# Patient Record
Sex: Male | Born: 1945
Health system: Southern US, Community
[De-identification: ages and names within clinical notes are randomized; demographics above are authoritative.]

## PROBLEM LIST (undated history)

## (undated) DIAGNOSIS — I25119 Atherosclerotic heart disease of native coronary artery with unspecified angina pectoris: Secondary | ICD-10-CM

## (undated) DIAGNOSIS — T7840XA Allergy, unspecified, initial encounter: Secondary | ICD-10-CM

## (undated) DIAGNOSIS — M109 Gout, unspecified: Secondary | ICD-10-CM

## (undated) DIAGNOSIS — I2510 Atherosclerotic heart disease of native coronary artery without angina pectoris: Secondary | ICD-10-CM

## (undated) DIAGNOSIS — R0602 Shortness of breath: Secondary | ICD-10-CM

## (undated) DIAGNOSIS — C439 Malignant melanoma of skin, unspecified: Secondary | ICD-10-CM

## (undated) DIAGNOSIS — I10 Essential (primary) hypertension: Secondary | ICD-10-CM

## (undated) DIAGNOSIS — E785 Hyperlipidemia, unspecified: Secondary | ICD-10-CM

## (undated) DIAGNOSIS — R7302 Impaired glucose tolerance (oral): Secondary | ICD-10-CM

## (undated) DIAGNOSIS — N400 Enlarged prostate without lower urinary tract symptoms: Secondary | ICD-10-CM

## (undated) DIAGNOSIS — Z86010 Personal history of colonic polyps: Secondary | ICD-10-CM

## (undated) DIAGNOSIS — R079 Chest pain, unspecified: Secondary | ICD-10-CM

## (undated) HISTORY — DX: Malignant melanoma of skin, unspecified: C43.9

## (undated) HISTORY — DX: Hyperlipidemia, unspecified: E78.5

## (undated) HISTORY — DX: Chest pain, unspecified: R07.9

## (undated) HISTORY — DX: Atherosclerotic heart disease of native coronary artery with unspecified angina pectoris: I25.119

## (undated) HISTORY — DX: Benign prostatic hyperplasia without lower urinary tract symptoms: N40.0

## (undated) HISTORY — DX: Personal history of colonic polyps: Z86.010

## (undated) HISTORY — DX: Shortness of breath: R06.02

## (undated) HISTORY — PX: OTHER SURGICAL HISTORY: SHX169

## (undated) HISTORY — DX: Gout, unspecified: M10.9

## (undated) HISTORY — DX: Allergy, unspecified, initial encounter: T78.40XA

## (undated) HISTORY — DX: Essential (primary) hypertension: I10

## (undated) HISTORY — DX: Impaired glucose tolerance (oral): R73.02

## (undated) HISTORY — PX: HERNIA REPAIR: SHX51

---

## 2000-01-04 ENCOUNTER — Encounter: Admission: RE | Admit: 2000-01-04 | Discharge: 2000-01-04 | Payer: Self-pay | Admitting: *Deleted

## 2000-01-04 ENCOUNTER — Encounter: Payer: Self-pay | Admitting: *Deleted

## 2000-11-18 ENCOUNTER — Inpatient Hospital Stay (HOSPITAL_COMMUNITY): Admission: EM | Admit: 2000-11-18 | Discharge: 2000-11-19 | Payer: Self-pay | Admitting: Emergency Medicine

## 2000-11-18 ENCOUNTER — Encounter: Payer: Self-pay | Admitting: Orthopedic Surgery

## 2002-04-10 ENCOUNTER — Ambulatory Visit (HOSPITAL_COMMUNITY): Admission: RE | Admit: 2002-04-10 | Discharge: 2002-04-10 | Payer: Self-pay | Admitting: Gastroenterology

## 2002-04-10 ENCOUNTER — Encounter (INDEPENDENT_AMBULATORY_CARE_PROVIDER_SITE_OTHER): Payer: Self-pay | Admitting: Specialist

## 2004-09-03 ENCOUNTER — Encounter: Admission: RE | Admit: 2004-09-03 | Discharge: 2004-09-03 | Payer: Self-pay | Admitting: Family Medicine

## 2006-01-11 ENCOUNTER — Ambulatory Visit: Payer: Self-pay | Admitting: Internal Medicine

## 2006-01-18 ENCOUNTER — Ambulatory Visit: Payer: Self-pay | Admitting: Internal Medicine

## 2007-01-12 ENCOUNTER — Ambulatory Visit: Payer: Self-pay | Admitting: Internal Medicine

## 2007-01-12 LAB — CONVERTED CEMR LAB
ALT: 23 units/L (ref 0–40)
AST: 27 units/L (ref 0–37)
Albumin: 4.3 g/dL (ref 3.5–5.2)
Alkaline Phosphatase: 48 units/L (ref 39–117)
BUN: 10 mg/dL (ref 6–23)
Basophils Absolute: 0 10*3/uL (ref 0.0–0.1)
Basophils Relative: 0.8 % (ref 0.0–1.0)
Bilirubin, Direct: 0.1 mg/dL (ref 0.0–0.3)
CO2: 33 meq/L — ABNORMAL HIGH (ref 19–32)
Calcium: 9.7 mg/dL (ref 8.4–10.5)
Chloride: 104 meq/L (ref 96–112)
Cholesterol: 151 mg/dL (ref 0–200)
Creatinine, Ser: 1 mg/dL (ref 0.4–1.5)
Eosinophils Absolute: 0.2 10*3/uL (ref 0.0–0.6)
Eosinophils Relative: 4.2 % (ref 0.0–5.0)
GFR calc Af Amer: 98 mL/min
GFR calc non Af Amer: 81 mL/min
Glucose, Bld: 103 mg/dL — ABNORMAL HIGH (ref 70–99)
HCT: 52.2 % — ABNORMAL HIGH (ref 39.0–52.0)
HDL: 46.2 mg/dL (ref >39.0)
Hemoglobin: 17.5 g/dL — ABNORMAL HIGH (ref 13.0–17.0)
LDL Cholesterol: 83 mg/dL (ref 0–99)
Lymphocytes Relative: 39.1 % (ref 12.0–46.0)
MCHC: 33.6 g/dL (ref 30.0–36.0)
MCV: 87.7 fL (ref 78.0–100.0)
Monocytes Absolute: 0.4 10*3/uL (ref 0.2–0.7)
Monocytes Relative: 7.9 % (ref 3.0–11.0)
Neutro Abs: 2.4 10*3/uL (ref 1.4–7.7)
Neutrophils Relative %: 48 % (ref 43.0–77.0)
PSA: 0.48 ng/mL (ref 0.10–4.00)
Platelets: 240 10*3/uL (ref 150–400)
Potassium: 5.4 meq/L — ABNORMAL HIGH (ref 3.5–5.1)
RBC: 5.95 M/uL — ABNORMAL HIGH (ref 4.22–5.81)
RDW: 13.4 % (ref 11.5–14.6)
Sodium: 143 meq/L (ref 135–145)
TSH: 2.85 microintl units/mL (ref 0.35–5.50)
Total Bilirubin: 0.8 mg/dL (ref 0.3–1.2)
Total CHOL/HDL Ratio: 3.3
Total Protein: 6.8 g/dL (ref 6.0–8.3)
Triglycerides: 111 mg/dL (ref 0–149)
VLDL: 22 mg/dL (ref 0–40)
WBC: 5 10*3/uL (ref 4.5–10.5)

## 2007-01-19 ENCOUNTER — Ambulatory Visit: Payer: Self-pay | Admitting: Internal Medicine

## 2007-02-09 ENCOUNTER — Encounter: Payer: Self-pay | Admitting: Internal Medicine

## 2007-02-09 LAB — HM COLONOSCOPY

## 2007-05-16 ENCOUNTER — Ambulatory Visit: Payer: Self-pay | Admitting: Internal Medicine

## 2008-09-02 ENCOUNTER — Encounter: Payer: Self-pay | Admitting: Internal Medicine

## 2008-09-20 ENCOUNTER — Ambulatory Visit: Payer: Self-pay | Admitting: Internal Medicine

## 2008-09-20 LAB — CONVERTED CEMR LAB
ALT: 31 units/L (ref 0–53)
AST: 31 units/L (ref 0–37)
Albumin: 4.3 g/dL (ref 3.5–5.2)
Alkaline Phosphatase: 48 units/L (ref 39–117)
BUN: 11 mg/dL (ref 6–23)
Basophils Absolute: 0 10*3/uL (ref 0.0–0.1)
Basophils Relative: 0 % (ref 0.0–3.0)
Bilirubin Urine: NEGATIVE
Bilirubin, Direct: 0.1 mg/dL (ref 0.0–0.3)
Blood in Urine, dipstick: NEGATIVE
CO2: 31 meq/L (ref 19–32)
Calcium: 9.2 mg/dL (ref 8.4–10.5)
Chloride: 107 meq/L (ref 96–112)
Cholesterol: 145 mg/dL (ref 0–200)
Creatinine, Ser: 1 mg/dL (ref 0.4–1.5)
Eosinophils Absolute: 0.2 10*3/uL (ref 0.0–0.7)
Eosinophils Relative: 2.6 % (ref 0.0–5.0)
GFR calc Af Amer: 97 mL/min
GFR calc non Af Amer: 80 mL/min
Glucose, Bld: 103 mg/dL — ABNORMAL HIGH (ref 70–99)
Glucose, Urine, Semiquant: NEGATIVE
HCT: 49.9 % (ref 39.0–52.0)
HDL: 43.3 mg/dL (ref >39.0)
Hemoglobin: 17 g/dL (ref 13.0–17.0)
Ketones, urine, test strip: NEGATIVE
LDL Cholesterol: 86 mg/dL (ref 0–99)
Lymphocytes Relative: 33.3 % (ref 12.0–46.0)
MCHC: 34.1 g/dL (ref 30.0–36.0)
MCV: 87.8 fL (ref 78.0–100.0)
Monocytes Absolute: 0.5 10*3/uL (ref 0.1–1.0)
Monocytes Relative: 7.7 % (ref 3.0–12.0)
Neutro Abs: 3.2 10*3/uL (ref 1.4–7.7)
Neutrophils Relative %: 56.4 % (ref 43.0–77.0)
Nitrite: NEGATIVE
PSA: 0.33 ng/mL (ref 0.10–4.00)
Platelets: 191 10*3/uL (ref 150–400)
Potassium: 4.4 meq/L (ref 3.5–5.1)
Protein, U semiquant: NEGATIVE
RBC: 5.68 M/uL (ref 4.22–5.81)
RDW: 13 % (ref 11.5–14.6)
Sodium: 145 meq/L (ref 135–145)
Specific Gravity, Urine: 1.02
TSH: 1.61 microintl units/mL (ref 0.35–5.50)
Total Bilirubin: 1.1 mg/dL (ref 0.3–1.2)
Total CHOL/HDL Ratio: 3.3
Total Protein: 7.1 g/dL (ref 6.0–8.3)
Triglycerides: 81 mg/dL (ref 0–149)
Urobilinogen, UA: 0.2
VLDL: 16 mg/dL (ref 0–40)
WBC Urine, dipstick: NEGATIVE
WBC: 5.9 10*3/uL (ref 4.5–10.5)
pH: 6.5

## 2008-09-27 ENCOUNTER — Ambulatory Visit: Payer: Self-pay | Admitting: Internal Medicine

## 2008-09-27 DIAGNOSIS — Z860100 Personal history of colon polyps, unspecified: Secondary | ICD-10-CM

## 2008-09-27 HISTORY — DX: Personal history of colon polyps, unspecified: Z86.0100

## 2008-09-27 HISTORY — DX: Personal history of colonic polyps: Z86.010

## 2009-01-17 ENCOUNTER — Ambulatory Visit: Payer: Self-pay | Admitting: Internal Medicine

## 2009-01-17 DIAGNOSIS — I10 Essential (primary) hypertension: Secondary | ICD-10-CM

## 2009-01-17 DIAGNOSIS — I152 Hypertension secondary to endocrine disorders: Secondary | ICD-10-CM | POA: Insufficient documentation

## 2009-01-17 HISTORY — DX: Essential (primary) hypertension: I10

## 2009-02-21 ENCOUNTER — Ambulatory Visit: Payer: Self-pay | Admitting: Internal Medicine

## 2009-08-14 ENCOUNTER — Encounter: Payer: Self-pay | Admitting: Internal Medicine

## 2009-09-19 ENCOUNTER — Ambulatory Visit: Payer: Self-pay | Admitting: Internal Medicine

## 2009-09-19 DIAGNOSIS — N400 Enlarged prostate without lower urinary tract symptoms: Secondary | ICD-10-CM

## 2009-09-19 HISTORY — DX: Benign prostatic hyperplasia without lower urinary tract symptoms: N40.0

## 2009-09-19 LAB — CONVERTED CEMR LAB
Bilirubin Urine: NEGATIVE
Blood in Urine, dipstick: NEGATIVE
Glucose, Urine, Semiquant: NEGATIVE
Ketones, urine, test strip: NEGATIVE
Nitrite: NEGATIVE
PSA: 0.46 ng/mL (ref 0.10–4.00)
Specific Gravity, Urine: 1.02
Urobilinogen, UA: 0.2
WBC Urine, dipstick: NEGATIVE
pH: 5

## 2009-10-17 ENCOUNTER — Ambulatory Visit: Payer: Self-pay | Admitting: Internal Medicine

## 2009-11-07 ENCOUNTER — Ambulatory Visit: Payer: Self-pay | Admitting: Internal Medicine

## 2009-11-07 DIAGNOSIS — M109 Gout, unspecified: Secondary | ICD-10-CM

## 2009-11-07 DIAGNOSIS — I2510 Atherosclerotic heart disease of native coronary artery without angina pectoris: Secondary | ICD-10-CM | POA: Insufficient documentation

## 2009-11-07 HISTORY — DX: Gout, unspecified: M10.9

## 2010-02-06 ENCOUNTER — Ambulatory Visit: Payer: Self-pay | Admitting: Internal Medicine

## 2010-02-06 LAB — CONVERTED CEMR LAB
ALT: 24 units/L (ref 0–53)
AST: 28 units/L (ref 0–37)
Albumin: 4.6 g/dL (ref 3.5–5.2)
Basophils Relative: 0.5 % (ref 0.0–3.0)
Bilirubin Urine: NEGATIVE
Blood in Urine, dipstick: NEGATIVE
Chloride: 109 meq/L (ref 96–112)
Eosinophils Relative: 2.4 % (ref 0.0–5.0)
GFR calc non Af Amer: 71.4 mL/min (ref >60)
Glucose, Urine, Semiquant: NEGATIVE
HCT: 48.5 % (ref 39.0–52.0)
Hemoglobin: 15.9 g/dL (ref 13.0–17.0)
Ketones, urine, test strip: NEGATIVE
LDL Cholesterol: 64 mg/dL (ref 0–99)
Lymphs Abs: 1.6 10*3/uL (ref 0.7–4.0)
Monocytes Relative: 8.9 % (ref 3.0–12.0)
Neutro Abs: 3 10*3/uL (ref 1.4–7.7)
Nitrite: NEGATIVE
Potassium: 4.4 meq/L (ref 3.5–5.1)
Protein, U semiquant: NEGATIVE
RBC: 5.4 M/uL (ref 4.22–5.81)
Sodium: 142 meq/L (ref 135–145)
Specific Gravity, Urine: 1.02
TSH: 2.09 microintl units/mL (ref 0.35–5.50)
Total Bilirubin: 0.8 mg/dL (ref 0.3–1.2)
Total Protein: 7.5 g/dL (ref 6.0–8.3)
Urobilinogen, UA: 0.2
VLDL: 20.8 mg/dL (ref 0.0–40.0)
WBC Urine, dipstick: NEGATIVE
WBC: 5.2 10*3/uL (ref 4.5–10.5)
pH: 5

## 2010-02-13 ENCOUNTER — Ambulatory Visit: Payer: Self-pay | Admitting: Internal Medicine

## 2010-08-14 ENCOUNTER — Ambulatory Visit: Payer: Self-pay | Admitting: Internal Medicine

## 2010-08-14 DIAGNOSIS — E785 Hyperlipidemia, unspecified: Secondary | ICD-10-CM | POA: Insufficient documentation

## 2010-08-14 DIAGNOSIS — E1169 Type 2 diabetes mellitus with other specified complication: Secondary | ICD-10-CM

## 2010-08-14 HISTORY — DX: Type 2 diabetes mellitus with other specified complication: E11.69

## 2010-08-14 HISTORY — DX: Hyperlipidemia, unspecified: E78.5

## 2010-12-31 NOTE — Assessment & Plan Note (Signed)
Summary: 6 month rov/njr   Vital Signs:  Patient profile:   65 year old male Weight:      220 pounds Temp:     98.1 degrees F oral BP sitting:   110 / 78  (right arm) Cuff size:   regular  Vitals Entered By: Duard Brady LPN (August 14, 2010 10:04 AM) CC: 6 mos rov - doing well   CC:  6 mos rov - doing well.  History of Present Illness: 65 year old patient who is seen today for follow-up.  He has a history of hypertension and dyslipidemia.  Medical regimen includes 80 mg of simvastatin, as well as amlodipine.  Is now well.  No concerns or complaints.  His weight is down modestly  Allergies: 1)  Sulfamethoxazole (Sulfamethoxazole)  Past History:  Past Medical History: High Cholesterol Allergies Colonic polyps, hx of Hypertension Benign prostatic hypertrophy suspected gout impaired glucose tolerance Hyperlipidemia  Review of Systems  The patient denies anorexia, fever, weight loss, weight gain, vision loss, decreased hearing, hoarseness, chest pain, syncope, dyspnea on exertion, peripheral edema, prolonged cough, headaches, hemoptysis, abdominal pain, melena, hematochezia, severe indigestion/heartburn, hematuria, incontinence, genital sores, muscle weakness, suspicious skin lesions, transient blindness, difficulty walking, depression, unusual weight change, abnormal bleeding, enlarged lymph nodes, angioedema, breast masses, and testicular masses.    Physical Exam  General:  overweight-appearing.  120/84overweight-appearing.   Head:  Normocephalic and atraumatic without obvious abnormalities. No apparent alopecia or balding. Eyes:  No corneal or conjunctival inflammation noted. EOMI. Perrla. Funduscopic exam benign, without hemorrhages, exudates or papilledema. Vision grossly normal. Mouth:  Oral mucosa and oropharynx without lesions or exudates.  Teeth in good repair. Neck:  No deformities, masses, or tenderness noted. Lungs:  Normal respiratory effort, chest  expands symmetrically. Lungs are clear to auscultation, no crackles or wheezes. Heart:  Normal rate and regular rhythm. S1 and S2 normal without gallop, murmur, click, rub or other extra sounds. Abdomen:  Bowel sounds positive,abdomen soft and non-tender without masses, organomegaly or hernias noted. Msk:  No deformity or scoliosis noted of thoracic or lumbar spine.   Pulses:  R and L carotid,radial,femoral,dorsalis pedis and posterior tibial pulses are full and equal bilaterally Extremities:  No clubbing, cyanosis, edema, or deformity noted with normal full range of motion of all joints.     Impression & Recommendations:  Problem # 1:  HYPERLIPIDEMIA (ICD-272.4)  The following medications were removed from the medication list:    Simvastatin 80 Mg Tabs (Simvastatin) ..... One daily His updated medication list for this problem includes:    Simvastatin 20 Mg Tabs (Simvastatin) ..... One daily  The following medications were removed from the medication list:    Simvastatin 80 Mg Tabs (Simvastatin) ..... One daily His updated medication list for this problem includes:    Simvastatin 20 Mg Tabs (Simvastatin) ..... One daily  Problem # 2:  HYPERTENSION (ICD-401.9)  His updated medication list for this problem includes:    Amlodipine Besylate 5 Mg Tabs (Amlodipine besylate) ..... One daily  His updated medication list for this problem includes:    Amlodipine Besylate 5 Mg Tabs (Amlodipine besylate) ..... One daily  Complete Medication List: 1)  Bayer Aspirin 325 Mg Tabs (Aspirin) .Marland Kitchen.. 1 once daily 2)  Levitra 10 Mg Tabs (Vardenafil hcl) .... Use daily as directed 3)  Tamsulosin Hcl 0.4 Mg Caps (Tamsulosin hcl) .... One daily 4)  Amlodipine Besylate 5 Mg Tabs (Amlodipine besylate) .... One daily 5)  Simvastatin 20 Mg Tabs (Simvastatin) .... One daily  Patient Instructions: 1)  Please schedule a follow-up appointment in 6 months. 2)  Advised not to eat any food or drink any liquids  after 10 PM the night before your procedure. 3)  Limit your Sodium (Salt) to less than 2 grams a day(slightly less than 1/2 a teaspoon) to prevent fluid retention, swelling, or worsening of symptoms. 4)  It is important that you exercise regularly at least 20 minutes 5 times a week. If you develop chest pain, have severe difficulty breathing, or feel very tired , stop exercising immediately and seek medical attention. 5)  You need to lose weight. Consider a lower calorie diet and regular exercise.  6)  Check your Blood Pressure regularly. If it is above: 150/90  you should make an appointment. Prescriptions: SIMVASTATIN 20 MG TABS (SIMVASTATIN) one daily  #90 x 6   Entered and Authorized by:   Gordy Savers  MD   Signed by:   Gordy Savers  MD on 08/14/2010   Method used:   Print then Give to Patient   RxID:   [id number] AMLODIPINE BESYLATE 5 MG TABS (AMLODIPINE BESYLATE) one daily  #90 x 6   Entered and Authorized by:   Gordy Savers  MD   Signed by:   Gordy Savers  MD on 08/14/2010   Method used:   Print then Give to Patient   RxID:   [id number] TAMSULOSIN HCL 0.4 MG CAPS (TAMSULOSIN HCL) one daily  #90 x 6   Entered and Authorized by:   Gordy Savers  MD   Signed by:   Gordy Savers  MD on 08/14/2010   Method used:   Print then Give to Patient   RxID:   [id number] LEVITRA 10 MG TABS (VARDENAFIL HCL) use daily as directed  #6 x 6   Entered and Authorized by:   Gordy Savers  MD   Signed by:   Gordy Savers  MD on 08/14/2010   Method used:   Print then Give to Patient   RxID:   [id number]

## 2010-12-31 NOTE — Assessment & Plan Note (Signed)
Summary: CPX//CCM   Vital Signs:  Patient profile:   65 year old male Height:      70 inches Weight:      223 pounds BMI:     32.11 Temp:     97.8 degrees F oral BP sitting:   138 / 76  (right arm) Cuff size:   regular  Vitals Entered By: Duard Brady LPN (February 13, 2010 9:33 AM) CC: cpx - doing well Is Patient Diabetic? No   CC:  cpx - doing well.  History of Present Illness: a 65 year old patient who is seen today for annual examination.  He enjoys excellent health with a history of hypertension, BPH, and history of colonic polyps.  He has treated dyslipidemia.  He has a family history of coronary artery disease.  He denies any exertional chest pain.  Preventive Screening-Counseling & Management  Alcohol-Tobacco     Smoking Status: never  Allergies: 1)  Sulfamethoxazole (Sulfamethoxazole)  Past History:  Past Medical History: High Cholesterol Allergies Colonic polyps, hx of Hypertension Benign prostatic hypertrophy suspected gout impaired glucose tolerance  Past Surgical History: Reviewed history from 09/27/2008 and no changes required. right inguinal  hernia repair in 1990   colonoscopy March 2008  Family History: Reviewed history from 09/27/2008 and no changes required. Family History Diabetes 1st degree relative Family History Hypertension Family History of Cardiovascular disorder  father died age 41 of MI Mother died age 17 complications of senile dementia  One brother, status post CABG one sister, status post  CABG one sister deceased from melanoma  Social History: Reviewed history from 09/27/2008 and no changes required. Occupation: Airline pilot Rep Married discontinued tobacco, approximately 30 years ago Retired Smoking Status:  never  Review of Systems  The patient denies anorexia, fever, weight loss, weight gain, vision loss, decreased hearing, hoarseness, chest pain, syncope, dyspnea on exertion, peripheral edema, prolonged cough,  headaches, hemoptysis, abdominal pain, melena, hematochezia, severe indigestion/heartburn, hematuria, incontinence, genital sores, muscle weakness, suspicious skin lesions, transient blindness, difficulty walking, depression, unusual weight change, abnormal bleeding, enlarged lymph nodes, angioedema, breast masses, and testicular masses.    Physical Exam  General:  overweight-appearing.  130/80overweight-appearing.   Head:  Normocephalic and atraumatic without obvious abnormalities. No apparent alopecia or balding. Eyes:  No corneal or conjunctival inflammation noted. EOMI. Perrla. Funduscopic exam benign, without hemorrhages, exudates or papilledema. Vision grossly normal. Ears:  External ear exam shows no significant lesions or deformities.  Otoscopic examination reveals clear canals, tympanic membranes are intact bilaterally without bulging, retraction, inflammation or discharge. Hearing is grossly normal bilaterally. Nose:  External nasal examination shows no deformity or inflammation. Nasal mucosa are pink and moist without lesions or exudates. Mouth:  Oral mucosa and oropharynx without lesions or exudates.  Teeth in good repair. Neck:  No deformities, masses, or tenderness noted. Chest Wall:  No deformities, masses, tenderness or gynecomastia noted. Breasts:  No masses or gynecomastia noted Lungs:  Normal respiratory effort, chest expands symmetrically. Lungs are clear to auscultation, no crackles or wheezes. Heart:  Normal rate and regular rhythm. S1 and S2 normal without gallop, murmur, click, rub or other extra sounds. Abdomen:  Bowel sounds positive,abdomen soft and non-tender without masses, organomegaly or hernias noted. Rectal:  No external abnormalities noted. Normal sphincter tone. No rectal masses or tenderness. Genitalia:  Testes bilaterally descended without nodularity, tenderness or masses. No scrotal masses or lesions. No penis lesions or urethral discharge. Prostate:  2+  enlarged.  2+ enlarged.   Msk:  No deformity or  scoliosis noted of thoracic or lumbar spine.   Pulses:  R and L carotid,radial,femoral,dorsalis pedis and posterior tibial pulses are full and equal bilaterally Extremities:  No clubbing, cyanosis, edema, or deformity noted with normal full range of motion of all joints.   Neurologic:  No cranial nerve deficits noted. Station and gait are normal. Plantar reflexes are down-going bilaterally. DTRs are symmetrical throughout. Sensory, motor and coordinative functions appear intact. Skin:  Intact without suspicious lesions or rashes Cervical Nodes:  No lymphadenopathy noted Axillary Nodes:  No palpable lymphadenopathy Inguinal Nodes:  No significant adenopathy Psych:  Cognition and judgment appear intact. Alert and cooperative with normal attention span and concentration. No apparent delusions, illusions, hallucinations   Impression & Recommendations:  Problem # 1:  PREVENTIVE HEALTH CARE (ICD-V70.0)  Complete Medication List: 1)  Bayer Aspirin 325 Mg Tabs (Aspirin) .Marland Kitchen.. 1 once daily 2)  Levitra 10 Mg Tabs (Vardenafil hcl) .... Use daily as directed 3)  Tamsulosin Hcl 0.4 Mg Caps (Tamsulosin hcl) .... One daily 4)  Amlodipine Besylate 5 Mg Tabs (Amlodipine besylate) .... One daily 5)  Simvastatin 80 Mg Tabs (Simvastatin) .... One daily  Other Orders: EKG w/ Interpretation (93000)  Patient Instructions: 1)  Limit your Sodium (Salt). 2)  It is important that you exercise regularly at least 20 minutes 5 times a week. If you develop chest pain, have severe difficulty breathing, or feel very tired , stop exercising immediately and seek medical attention. 3)  You need to lose weight. Consider a lower calorie diet and regular exercise.  4)  Check your Blood Pressure regularly. If it is above: 150/90 you should make an appointment. 5)  Please schedule a follow-up appointment in 6 months. Prescriptions: AMLODIPINE BESYLATE 5 MG TABS (AMLODIPINE  BESYLATE) one daily  #90 x 6   Entered and Authorized by:   Gordy Savers  MD   Signed by:   Gordy Savers  MD on 02/13/2010   Method used:   Print then Give to Patient   RxID:   [id number] SIMVASTATIN 80 MG TABS (SIMVASTATIN) one daily  #90 x 6   Entered and Authorized by:   Gordy Savers  MD   Signed by:   Gordy Savers  MD on 02/13/2010   Method used:   Print then Give to Patient   RxID:   [id number] TAMSULOSIN HCL 0.4 MG CAPS (TAMSULOSIN HCL) one daily  #90 x 6   Entered and Authorized by:   Gordy Savers  MD   Signed by:   Gordy Savers  MD on 02/13/2010   Method used:   Print then Give to Patient   RxID:   [id number] LEVITRA 10 MG TABS (VARDENAFIL HCL) use daily as directed  #6 x 6   Entered and Authorized by:   Gordy Savers  MD   Signed by:   Gordy Savers  MD on 02/13/2010   Method used:   Print then Give to Patient   RxID:   [id number]

## 2011-02-11 ENCOUNTER — Encounter: Payer: Self-pay | Admitting: Internal Medicine

## 2011-02-12 ENCOUNTER — Ambulatory Visit (INDEPENDENT_AMBULATORY_CARE_PROVIDER_SITE_OTHER): Payer: PRIVATE HEALTH INSURANCE | Admitting: Internal Medicine

## 2011-02-12 ENCOUNTER — Encounter: Payer: Self-pay | Admitting: Internal Medicine

## 2011-02-12 DIAGNOSIS — E785 Hyperlipidemia, unspecified: Secondary | ICD-10-CM

## 2011-02-12 DIAGNOSIS — I10 Essential (primary) hypertension: Secondary | ICD-10-CM

## 2011-02-12 DIAGNOSIS — Z125 Encounter for screening for malignant neoplasm of prostate: Secondary | ICD-10-CM

## 2011-02-12 DIAGNOSIS — N400 Enlarged prostate without lower urinary tract symptoms: Secondary | ICD-10-CM

## 2011-02-12 LAB — HEPATIC FUNCTION PANEL
ALT: 26 U/L (ref 0–53)
Bilirubin, Direct: 0.1 mg/dL (ref 0.0–0.3)
Total Bilirubin: 1 mg/dL (ref 0.3–1.2)
Total Protein: 7.4 g/dL (ref 6.0–8.3)

## 2011-02-12 LAB — CBC WITH DIFFERENTIAL/PLATELET
Eosinophils Relative: 2.6 % (ref 0.0–5.0)
Lymphocytes Relative: 29.3 % (ref 12.0–46.0)
Lymphs Abs: 1.7 10*3/uL (ref 0.7–4.0)
MCHC: 33.9 g/dL (ref 30.0–36.0)
MCV: 87.8 fl (ref 78.0–100.0)
Monocytes Relative: 8.7 % (ref 3.0–12.0)
Neutro Abs: 3.4 10*3/uL (ref 1.4–7.7)
Platelets: 215 10*3/uL (ref 150.0–400.0)
RBC: 5.74 Mil/uL (ref 4.22–5.81)
RDW: 14.4 % (ref 11.5–14.6)

## 2011-02-12 LAB — BASIC METABOLIC PANEL
BUN: 16 mg/dL (ref 6–23)
CO2: 29 mEq/L (ref 19–32)
Chloride: 102 mEq/L (ref 96–112)
Creatinine, Ser: 1.1 mg/dL (ref 0.4–1.5)
Glucose, Bld: 120 mg/dL — ABNORMAL HIGH (ref 70–99)

## 2011-02-12 LAB — LIPID PANEL
Cholesterol: 185 mg/dL (ref 0–200)
HDL: 45.9 mg/dL (ref >39.00)
LDL Cholesterol: 117 mg/dL — ABNORMAL HIGH (ref 0–99)
VLDL: 22.2 mg/dL (ref 0.0–40.0)

## 2011-02-12 LAB — PSA: PSA: 0.49 ng/mL (ref 0.10–4.00)

## 2011-02-12 MED ORDER — VARDENAFIL HCL 10 MG PO TABS: 10.0000 mg | ORAL_TABLET | Freq: Every day | ORAL | Status: DC | PRN

## 2011-02-12 MED ORDER — SIMVASTATIN 20 MG PO TABS: 20.0000 mg | ORAL_TABLET | Freq: Every day | ORAL | Status: DC

## 2011-02-12 MED ORDER — TAMSULOSIN HCL 0.4 MG PO CAPS: 0.4000 mg | ORAL_CAPSULE | Freq: Every day | ORAL | Status: DC

## 2011-02-12 MED ORDER — AMLODIPINE BESYLATE 5 MG PO TABS: 5.0000 mg | ORAL_TABLET | Freq: Every day | ORAL | Status: DC

## 2011-02-12 NOTE — Patient Instructions (Signed)
Limit your sodium (Salt) intake    It is important that you exercise regularly, at least 20 minutes 3 to 4 times per week.  If you develop chest pain or shortness of breath seek  medical attention.  Please check your blood pressure on a regular basis.  If it is consistently greater than 150/90, please make an office appointment.  Return in 6 months for follow-up   

## 2011-02-12 NOTE — Progress Notes (Signed)
  Subjective:    Patient ID: Vincent Stevens, male    DOB: Jul 17, 1946, 65 y.o.   MRN: [id number]  HPI   65 year old patient who is seen today for his six-month followup. He has a history of treated hypertension which has been well-controlled on amlodipine. He has dyslipidemia and remains on simvastatin 20 mg daily he has BPH and symptoms are well controlled on Flomax 0.4 mg daily he has a history of colonic polyps. No concerns or complaints today. He is fasting and it has been over one year since he has had laboratory studies performed. He denies any cardiopulmonary complaints.    Review of Systems  Constitutional: Negative for fever, chills, appetite change and fatigue.  HENT: Negative for hearing loss, ear pain, congestion, sore throat, trouble swallowing, neck stiffness, dental problem, voice change and tinnitus.   Eyes: Negative for pain, discharge and visual disturbance.  Respiratory: Negative for cough, chest tightness, wheezing and stridor.   Cardiovascular: Negative for chest pain, palpitations and leg swelling.  Gastrointestinal: Negative for nausea, vomiting, abdominal pain, diarrhea, constipation, blood in stool and abdominal distention.  Genitourinary: Negative for urgency, hematuria, flank pain, discharge, difficulty urinating and genital sores.  Musculoskeletal: Negative for myalgias, back pain, joint swelling, arthralgias and gait problem.  Skin: Negative for rash.  Neurological: Negative for dizziness, syncope, speech difficulty, weakness, numbness and headaches.  Hematological: Negative for adenopathy. Does not bruise/bleed easily.  Psychiatric/Behavioral: Negative for behavioral problems and dysphoric mood. The patient is not nervous/anxious.        Objective:   Physical Exam  Constitutional: He is oriented to person, place, and time. He appears well-developed.  HENT:  Head: Normocephalic.  Right Ear: External ear normal.  Left Ear: External ear normal.  Eyes:  Conjunctivae and EOM are normal.  Neck: Normal range of motion.  Cardiovascular: Normal rate and normal heart sounds.   Pulmonary/Chest: Breath sounds normal.  Abdominal: Bowel sounds are normal.  Musculoskeletal: Normal range of motion. He exhibits no edema and no tenderness.  Neurological: He is alert and oriented to person, place, and time.  Psychiatric: He has a normal mood and affect. His behavior is normal.          Assessment & Plan:   hypertension well controlled  Dyslipidemia. We'll check a lipid profile today  BPH asymptomatic  History colonic polyps.   We'll set up for complete physical in 6 months. Laboratory studies will be reviewed today. All medications refilled.

## 2011-02-19 ENCOUNTER — Telehealth: Payer: Self-pay | Admitting: *Deleted

## 2011-02-19 NOTE — Telephone Encounter (Signed)
Spoke with pt - informed of labs 

## 2011-02-19 NOTE — Telephone Encounter (Signed)
Lab results

## 2011-02-19 NOTE — Telephone Encounter (Signed)
Needs lab results 

## 2011-02-19 NOTE — Telephone Encounter (Signed)
All ok

## 2011-04-16 NOTE — Assessment & Plan Note (Signed)
[facility] OFFICE NOTE   NAME:Kinnard, HIEN PERREIRA                     MRN:          [id number]  DATE:01/19/2007                            DOB:          15-Jun-1946    A 65 year old gentleman seen today for an annual exam. Medical problems  include hyperlipidemia, BPH,history of colonic polyps.  He was doing well today without concerns of complaints. He does describe  some postnasal drip in the morning, cough and some nausea.   PAST MEDICAL HISTORY:  Pertinent for history of a hospital admission for  a right leg infection in 2001, also right inguinal hernia repair by Dr.  Zachery Dakins in 1990.   FAMILY HISTORY:  Unchanged.   PHYSICAL EXAMINATION:  VITAL SIGNS:  Weight 222, blood pressure 130/84.  SKIN:  Negative. He did have some mild excoriations over his lower legs.  HEAD/NECK:  Normal fundi.  ENT:  Clear.  NECK:  No bruits or adenopathy.  CHEST:  Clear.  CARDIOVASCULAR:  Normal heart sounds, no murmurs.  ABDOMEN:  Mildly overweight, soft, nontender, no organomegaly, masses or  bruit.  PELVIC:  External genitalia normal.  RECTAL:  Prostate +2 and benign. Stool heme negative.  EXTREMITIES:  Intact peripheral pulses.  NEUROLOGIC:  Negative.   IMPRESSION:  1. Hyperlipidemia.  2. Benign prostatic hypertrophy.  3. History of colonic polyps.   DISPOSITION:  The patient is in need of a followup colonoscopy. He will  contact the office and have this scheduled. He will refer to his records  for his endoscopist otherwise return here in 1 year for followup. Weight  loss encouraged.     Gordy Savers, MD  Electronically Signed    PFK/MedQ  DD: 01/19/2007  DT: 01/19/2007  Job #: [id number]

## 2011-04-16 NOTE — Procedures (Signed)
[facility]  Patient:    Vincent Stevens, Vincent Stevens Visit Number: [id number] MRN: [id number]          Service Type: END Location: ENDO Attending Physician:  Nelda Marseille Dictated by:   Petra Kuba, M.D. Proc. Date: 04/10/02 Admit Date:  04/10/2002   CC:         Lilly Cove, M.D.   Procedure Report  PROCEDURE:  Colonoscopy.  INDICATION:  Screening.  Consent was signed after risks, benefits, methods, and options thoroughly discussed in the office.  MEDICATIONS:  Demerol 70, Versed 7.  DESCRIPTION OF PROCEDURE:  Rectal inspection was pertinent for external hemorrhoids.  Digital exam was negative.  Video colonoscope was inserted and easily advanced the colon to the cecum.  On insertion, a small sigmoid polyp was seen.  We initially tried to biopsy it but with increased spasm, we elected to advance.  Photodocumentation was obtained.  The cecum was identified by the appendiceal orifice and the ileocecal valve.  No other abnormalities were seen on insertion.  The scope was inserted a short ways in the terminal ileum which was normal.  Photodocumentation was obtained.  The scope was slowly withdrawn.  The prep was adequate.  There was some liquid stool that required washing and suctioning.  On slow withdrawal through the colon, other than the small sigmoid polyp seen on insertion, no abnormalities were seen but some early left-sided diverticula.  When the polyp was found on withdrawal, two hot biopsies were obtained.  The scope was then slowly withdrawn.  On additional findings were seen.  Once back in the rectum, the scope was retroflexed, pertinent for some small internal hemorrhoids.  The scope was straightened and readvanced around the left side of the colon, air was suctioned and the scope removed.  The patient tolerated the procedure adequately.  There was no obvious immediate complication.  ENDOSCOPIC DIAGNOSES: 1. Internal and external  hemorrhoids. 2. Early left-sided diverticula. 3. Mid sigmoid small polyp, hot biopsied. 4. Otherwise within normal limits to the terminal ileum.  PLAN:  Await pathology but recheck screening in five years.  Happy to see back p.r.n..  Otherwise, return care to Dr. Karilyn Cota for the customary health care maintenance to include yearly rectals and guaiacs. Dictated by:   Petra Kuba, M.D. Attending Physician:  Nelda Marseille DD:  04/10/02 TD:  04/11/02 Job: [id number]

## 2011-11-08 ENCOUNTER — Ambulatory Visit (INDEPENDENT_AMBULATORY_CARE_PROVIDER_SITE_OTHER): Payer: PRIVATE HEALTH INSURANCE | Admitting: Internal Medicine

## 2011-11-08 ENCOUNTER — Encounter: Payer: Self-pay | Admitting: Internal Medicine

## 2011-11-08 DIAGNOSIS — T148XXA Other injury of unspecified body region, initial encounter: Secondary | ICD-10-CM

## 2011-11-08 DIAGNOSIS — I10 Essential (primary) hypertension: Secondary | ICD-10-CM

## 2011-11-08 DIAGNOSIS — T148 Other injury of unspecified body region: Secondary | ICD-10-CM

## 2011-11-08 DIAGNOSIS — W57XXXA Bitten or stung by nonvenomous insect and other nonvenomous arthropods, initial encounter: Secondary | ICD-10-CM

## 2011-11-08 NOTE — Patient Instructions (Signed)
Call if you develop an acute febrile illness or a skin rash  Return in 4 months for follow-up

## 2011-11-08 NOTE — Progress Notes (Signed)
  Subjective:    Patient ID: Vincent Stevens, male    DOB: 1946/01/16, 65 y.o.   MRN: [id number]  HPI 65 year old patient has a history of hypertension and dyslipidemia. He presents with a chief complaint of a infected lesion involving his left chest wall area. For the past several days he has noted increasing discomfort and erythema and was concerned about a skin and soft tissue infection   Review of Systems  Skin: Positive for rash.       Objective:   Physical Exam  Constitutional: He appears well-developed and well-nourished. No distress.       Blood pressure 120/80  Skin:       An embedded tick was noted involving the left lateral chest wall area. This was removed with tweezers. No foreign bodies were noted following removal.          Assessment & Plan:    Tick bite. The patient will report any acute febrile illness Hypertension well controlled  Return in the spring for his annual exam

## 2011-12-17 ENCOUNTER — Other Ambulatory Visit (INDEPENDENT_AMBULATORY_CARE_PROVIDER_SITE_OTHER): Payer: PRIVATE HEALTH INSURANCE

## 2011-12-17 DIAGNOSIS — Z0000 Encounter for general adult medical examination without abnormal findings: Secondary | ICD-10-CM

## 2011-12-17 DIAGNOSIS — Z125 Encounter for screening for malignant neoplasm of prostate: Secondary | ICD-10-CM

## 2011-12-17 DIAGNOSIS — I10 Essential (primary) hypertension: Secondary | ICD-10-CM

## 2011-12-17 LAB — POCT URINALYSIS DIPSTICK
Ketones, UA: NEGATIVE
Protein, UA: NEGATIVE
Spec Grav, UA: 1.02
pH, UA: 7.5

## 2011-12-17 LAB — CBC WITH DIFFERENTIAL/PLATELET
Basophils Absolute: 0 10*3/uL (ref 0.0–0.1)
Basophils Relative: 0.5 % (ref 0.0–3.0)
Eosinophils Relative: 2.5 % (ref 0.0–5.0)
HCT: 49 % (ref 39.0–52.0)
Hemoglobin: 16.4 g/dL (ref 13.0–17.0)
Lymphocytes Relative: 30.7 % (ref 12.0–46.0)
Lymphs Abs: 1.9 10*3/uL (ref 0.7–4.0)
Monocytes Relative: 9.2 % (ref 3.0–12.0)
Neutro Abs: 3.6 10*3/uL (ref 1.4–7.7)
RBC: 5.5 Mil/uL (ref 4.22–5.81)
RDW: 14.2 % (ref 11.5–14.6)
WBC: 6.3 10*3/uL (ref 4.5–10.5)

## 2011-12-17 LAB — HEPATIC FUNCTION PANEL
ALT: 29 U/L (ref 0–53)
AST: 31 U/L (ref 0–37)
Albumin: 4.4 g/dL (ref 3.5–5.2)
Alkaline Phosphatase: 48 U/L (ref 39–117)
Total Protein: 6.7 g/dL (ref 6.0–8.3)

## 2011-12-17 LAB — LIPID PANEL
Cholesterol: 170 mg/dL (ref 0–200)
HDL: 39.7 mg/dL (ref >39.00)
VLDL: 23.6 mg/dL (ref 0.0–40.0)

## 2011-12-17 LAB — BASIC METABOLIC PANEL
Calcium: 8.9 mg/dL (ref 8.4–10.5)
GFR: 74.91 mL/min (ref >60.00)
Glucose, Bld: 106 mg/dL — ABNORMAL HIGH (ref 70–99)
Potassium: 4.1 mEq/L (ref 3.5–5.1)
Sodium: 140 mEq/L (ref 135–145)

## 2011-12-17 LAB — PSA: PSA: 0.35 ng/mL (ref 0.10–4.00)

## 2011-12-17 LAB — TSH: TSH: 3.11 u[IU]/mL (ref 0.35–5.50)

## 2011-12-24 ENCOUNTER — Ambulatory Visit (INDEPENDENT_AMBULATORY_CARE_PROVIDER_SITE_OTHER): Payer: PRIVATE HEALTH INSURANCE | Admitting: Internal Medicine

## 2011-12-24 ENCOUNTER — Encounter: Payer: Self-pay | Admitting: Internal Medicine

## 2011-12-24 VITALS — BP 118/78 | HR 71 | Temp 97.5°F | Ht 69.5 in | Wt 223.0 lb

## 2011-12-24 DIAGNOSIS — Z86010 Personal history of colonic polyps: Secondary | ICD-10-CM

## 2011-12-24 DIAGNOSIS — I10 Essential (primary) hypertension: Secondary | ICD-10-CM

## 2011-12-24 DIAGNOSIS — E785 Hyperlipidemia, unspecified: Secondary | ICD-10-CM

## 2011-12-24 DIAGNOSIS — Z0000 Encounter for general adult medical examination without abnormal findings: Secondary | ICD-10-CM

## 2011-12-24 MED ORDER — AMLODIPINE BESYLATE 5 MG PO TABS: 5.0000 mg | ORAL_TABLET | Freq: Every day | ORAL | Status: DC

## 2011-12-24 MED ORDER — SIMVASTATIN 20 MG PO TABS: 20.0000 mg | ORAL_TABLET | Freq: Every day | ORAL | Status: DC

## 2011-12-24 MED ORDER — TAMSULOSIN HCL 0.4 MG PO CAPS: 0.4000 mg | ORAL_CAPSULE | Freq: Every day | ORAL | Status: DC

## 2011-12-24 MED ORDER — VARDENAFIL HCL 10 MG PO TABS: 10.0000 mg | ORAL_TABLET | Freq: Every day | ORAL | Status: DC | PRN

## 2011-12-24 NOTE — Patient Instructions (Signed)
It is important that you exercise regularly, at least 20 minutes 3 to 4 times per week.  If you develop chest pain or shortness of breath seek  medical attention.  Limit your sodium (Salt) intake  Schedule your colonoscopy to help detect colon cancer.  Return in one year for follow-up

## 2011-12-24 NOTE — Progress Notes (Signed)
Subjective:    Patient ID: Vincent Stevens, male    DOB: [birth year], 66 y.o.   MRN: [id number]  HPI  66 year old patient who is seen today for a health maintenance examination. He has done quite well he has treated hypertension and dyslipidemia. He also has a history of colonic polyps and is scheduled for followup colonoscopy this year. No concerns or complaints. Has history of mild BPH which is controlled with alpha blocker therapy.  1. Risk factors, based on past  M,S,F history-  cardiovascular risk factors include hypertension and dyslipidemia.  2.  Physical activities: Remains quite active without exercise limitations. Continues to work part-time  3.  Depression/mood: No history depression or mood disorder  4.  Hearing: No deficits  5.  ADL's: Independent in all aspects of daily living  6.  Fall risk: Low  7.  Home safety: No problems identified  8.  Height weight, and visual acuity; height and weight stable no difficulty with visual acuity  9.  Counseling: Heart healthy diet regular exercise all encouraged followup colonoscopy this year encouraged  10. Lab orders based on risk factors: Laboratory profile reviewed 11. Referral : GI for colonoscopy  12. Care plan: Continue regular exercise heart healthy diet followup colonoscopy  13. Cognitive assessment: Alert and oriented with normal affect. No cognitive dysfunction.      Alcohol-Tobacco  Smoking Status: never   Allergies:  1) Sulfamethoxazole (Sulfamethoxazole)   Past History:  Past Medical History:   High Cholesterol  Allergies  Colonic polyps, hx of  Hypertension  Benign prostatic hypertrophy  suspected gout  impaired glucose tolerance   Past Surgical History:  Reviewed history from 09/27/2008 and no changes required.   right inguinal hernia repair in 1990  colonoscopy March 2008   Family History:  Reviewed history from 09/27/2008 and no changes required.   Family History Diabetes 1st degree  relative  Family History Hypertension  Family History of Cardiovascular disorder  father died age 67 of MI  Mother died age 67 complications of senile dementia  One brother, status post CABG h/o DM2 one sister, status post CABG  one sister deceased from melanoma   Social History:  Reviewed history from 09/27/2008 and no changes required.  Occupation: Airline pilot Rep  Married  discontinued tobacco, approximately 30 years ago  Retired  Smoking Status: never     Review of Systems  Constitutional: Negative for fever, chills, activity change, appetite change and fatigue.  HENT: Negative for hearing loss, ear pain, congestion, rhinorrhea, sneezing, mouth sores, trouble swallowing, neck pain, neck stiffness, dental problem, voice change, sinus pressure and tinnitus.   Eyes: Negative for photophobia, pain, redness and visual disturbance.  Respiratory: Negative for apnea, cough, choking, chest tightness, shortness of breath and wheezing.   Cardiovascular: Negative for chest pain, palpitations and leg swelling.  Gastrointestinal: Negative for nausea, vomiting, abdominal pain, diarrhea, constipation, blood in stool, abdominal distention, anal bleeding and rectal pain.  Genitourinary: Negative for dysuria, urgency, frequency, hematuria, flank pain, decreased urine volume, discharge, penile swelling, scrotal swelling, difficulty urinating, genital sores and testicular pain.  Musculoskeletal: Negative for myalgias, back pain, joint swelling, arthralgias and gait problem.  Skin: Negative for color change, rash and wound.  Neurological: Negative for dizziness, tremors, seizures, syncope, facial asymmetry, speech difficulty, weakness, light-headedness, numbness and headaches.  Hematological: Negative for adenopathy. Does not bruise/bleed easily.  Psychiatric/Behavioral: Negative for suicidal ideas, hallucinations, behavioral problems, confusion, sleep disturbance, self-injury, dysphoric mood, decreased  concentration and agitation. The patient is not  nervous/anxious.        Objective:   Physical Exam  Constitutional: He appears well-developed and well-nourished.  HENT:  Head: Normocephalic and atraumatic.  Right Ear: External ear normal.  Left Ear: External ear normal.  Nose: Nose normal.  Mouth/Throat: Oropharynx is clear and moist.  Eyes: Conjunctivae and EOM are normal. Pupils are equal, round, and reactive to light. No scleral icterus.  Neck: Normal range of motion. Neck supple. No JVD present. No thyromegaly present.  Cardiovascular: Regular rhythm, normal heart sounds and intact distal pulses.  Exam reveals no gallop and no friction rub.   No murmur heard. Pulmonary/Chest: Effort normal and breath sounds normal. He exhibits no tenderness.  Abdominal: Soft. Bowel sounds are normal. He exhibits no distension and no mass. There is no tenderness.  Genitourinary: Penis normal.       Prostate +2 enlarged  Musculoskeletal: Normal range of motion. He exhibits no edema and no tenderness.  Lymphadenopathy:    He has no cervical adenopathy.  Neurological: He is alert. He has normal reflexes. No cranial nerve deficit. Coordination normal.  Skin: Skin is warm and dry. No rash noted.  Psychiatric: He has a normal mood and affect. His behavior is normal.          Assessment & Plan:   Preventive health examination Hypertension well controlled Dyslipidemia well controlled ED. Well controlled on Levitra Colonic polyps. Followup colonoscopy this year BPH reasonable control on alpha blocker therapy

## 2013-02-07 ENCOUNTER — Encounter: Payer: Self-pay | Admitting: Internal Medicine

## 2013-02-07 ENCOUNTER — Ambulatory Visit (INDEPENDENT_AMBULATORY_CARE_PROVIDER_SITE_OTHER): Payer: Medicare Other | Admitting: Internal Medicine

## 2013-02-07 VITALS — BP 130/90 | HR 64 | Temp 97.6°F | Resp 18 | Wt 217.0 lb

## 2013-02-07 DIAGNOSIS — J069 Acute upper respiratory infection, unspecified: Secondary | ICD-10-CM

## 2013-02-07 DIAGNOSIS — I10 Essential (primary) hypertension: Secondary | ICD-10-CM

## 2013-02-07 MED ORDER — VARDENAFIL HCL 10 MG PO TABS: 10.0000 mg | ORAL_TABLET | Freq: Every day | ORAL | Status: DC | PRN

## 2013-02-07 MED ORDER — TAMSULOSIN HCL 0.4 MG PO CAPS: 0.4000 mg | ORAL_CAPSULE | Freq: Every day | ORAL | Status: DC

## 2013-02-07 MED ORDER — HYDROCODONE-HOMATROPINE 5-1.5 MG/5ML PO SYRP: 5.0000 mL | ORAL_SOLUTION | Freq: Four times a day (QID) | ORAL | Status: AC | PRN

## 2013-02-07 MED ORDER — AMLODIPINE BESYLATE 5 MG PO TABS: 5.0000 mg | ORAL_TABLET | Freq: Every day | ORAL | Status: DC

## 2013-02-07 MED ORDER — VARDENAFIL HCL 20 MG PO TABS: 20.0000 mg | ORAL_TABLET | Freq: Every day | ORAL | Status: DC | PRN

## 2013-02-07 MED ORDER — SIMVASTATIN 20 MG PO TABS: 20.0000 mg | ORAL_TABLET | Freq: Every day | ORAL | Status: DC

## 2013-02-07 NOTE — Patient Instructions (Signed)
Get plenty of rest, Drink lots of  clear liquids, and use Tylenol or ibuprofen for fever and discomfort.    Call or return to clinic prn if these symptoms worsen or fail to improve as anticipated.  

## 2013-02-07 NOTE — Progress Notes (Signed)
Subjective:    Patient ID: Vincent Stevens, male    DOB: 11/15/1946, 67 y.o.   MRN: [id number]  HPI  67 year old patient who presents with a two-month history of cough. He describes a foreign body sensation in his throat that produces a nonproductive cough cough seems intermittent and at times associated with some sputum production in the morning. There's been no fever. In general he feels fairly well. He does have treated hypertension but not with  ACE inhibition.  Past Medical History  Diagnosis Date  . Hyperlipidemia   . Allergy   . Hypertension   . Gout   . Benign prostatic hypertrophy     History   Social History  . Marital Status: Single    Spouse Name: N/A    Number of Children: N/A  . Years of Education: N/A   Occupational History  . Not on file.   Social History Main Topics  . Smoking status: Former Smoker    Quit date: 11/29/1980  . Smokeless tobacco: Never Used  . Alcohol Use: No  . Drug Use: No  . Sexually Active: Not on file   Other Topics Concern  . Not on file   Social History Narrative  . No narrative on file    Past Surgical History  Procedure Laterality Date  . Hernia repair      Family History  Problem Relation Age of Onset  . Diabetes    . Hypertension    . Heart attack Father   . Dementia Mother   . Cancer Sister     Allergies  Allergen Reactions  . Sulfamethoxazole     REACTION: unspecified    Current Outpatient Prescriptions on File Prior to Visit  Medication Sig Dispense Refill  . vardenafil (LEVITRA) 10 MG tablet Take 1 tablet (10 mg total) by mouth daily as needed.  10 tablet  6   No current facility-administered medications on file prior to visit.    BP 130/90  Pulse 64  Temp(Src) 97.6 F (36.4 C) (Oral)  Resp 18  Wt 217 lb (98.431 kg)  BMI 31.6 kg/m2  SpO2 99%      Review of Systems  Constitutional: Negative for fever, chills, appetite change and fatigue.  HENT: Positive for congestion and postnasal drip.  Negative for hearing loss, ear pain, sore throat, trouble swallowing, neck stiffness, dental problem, voice change and tinnitus.   Eyes: Negative for pain, discharge and visual disturbance.  Respiratory: Positive for cough. Negative for chest tightness, wheezing and stridor.   Cardiovascular: Negative for chest pain, palpitations and leg swelling.  Gastrointestinal: Negative for nausea, vomiting, abdominal pain, diarrhea, constipation, blood in stool and abdominal distention.  Genitourinary: Negative for urgency, hematuria, flank pain, discharge, difficulty urinating and genital sores.  Musculoskeletal: Negative for myalgias, back pain, joint swelling, arthralgias and gait problem.  Skin: Negative for rash.  Neurological: Negative for dizziness, syncope, speech difficulty, weakness, numbness and headaches.  Hematological: Negative for adenopathy. Does not bruise/bleed easily.  Psychiatric/Behavioral: Negative for behavioral problems and dysphoric mood. The patient is not nervous/anxious.        Objective:   Physical Exam  Constitutional: He is oriented to person, place, and time. He appears well-developed.  HENT:  Head: Normocephalic.  Right Ear: External ear normal.  Left Ear: External ear normal.  Eyes: Conjunctivae and EOM are normal.  Neck: Normal range of motion.  Cardiovascular: Normal rate and normal heart sounds.   Pulmonary/Chest: Breath sounds normal.  Abdominal: Bowel sounds are  normal.  Musculoskeletal: Normal range of motion. He exhibits no edema and no tenderness.  Neurological: He is alert and oriented to person, place, and time.  Psychiatric: He has a normal mood and affect. His behavior is normal.          Assessment & Plan:    Viral URI with cough.  Will treat symptomatically

## 2013-02-08 ENCOUNTER — Other Ambulatory Visit (INDEPENDENT_AMBULATORY_CARE_PROVIDER_SITE_OTHER): Payer: Medicare Other

## 2013-02-08 DIAGNOSIS — Z0000 Encounter for general adult medical examination without abnormal findings: Secondary | ICD-10-CM

## 2013-02-08 LAB — POCT URINALYSIS DIPSTICK
Bilirubin, UA: NEGATIVE
Glucose, UA: NEGATIVE
Leukocytes, UA: NEGATIVE
Nitrite, UA: NEGATIVE

## 2013-02-08 LAB — CBC WITH DIFFERENTIAL/PLATELET
Basophils Absolute: 0 10*3/uL (ref 0.0–0.1)
HCT: 51.3 % (ref 39.0–52.0)
Hemoglobin: 17.2 g/dL — ABNORMAL HIGH (ref 13.0–17.0)
Lymphs Abs: 2 10*3/uL (ref 0.7–4.0)
MCHC: 33.5 g/dL (ref 30.0–36.0)
MCV: 86.6 fl (ref 78.0–100.0)
Monocytes Relative: 10.9 % (ref 3.0–12.0)
Neutro Abs: 2.2 10*3/uL (ref 1.4–7.7)
RDW: 13.7 % (ref 11.5–14.6)

## 2013-02-08 LAB — BASIC METABOLIC PANEL
CO2: 28 mEq/L (ref 19–32)
Calcium: 9.3 mg/dL (ref 8.4–10.5)
Creatinine, Ser: 1 mg/dL (ref 0.4–1.5)

## 2013-02-08 LAB — HEPATIC FUNCTION PANEL
ALT: 22 U/L (ref 0–53)
AST: 25 U/L (ref 0–37)
Albumin: 4.4 g/dL (ref 3.5–5.2)

## 2013-02-08 LAB — LIPID PANEL
Total CHOL/HDL Ratio: 5
Triglycerides: 156 mg/dL — ABNORMAL HIGH (ref 0.0–149.0)

## 2013-02-20 ENCOUNTER — Encounter: Payer: Self-pay | Admitting: Internal Medicine

## 2013-02-20 ENCOUNTER — Ambulatory Visit (INDEPENDENT_AMBULATORY_CARE_PROVIDER_SITE_OTHER): Payer: Medicare Other | Admitting: Internal Medicine

## 2013-02-20 VITALS — BP 140/80 | HR 63 | Temp 97.8°F | Resp 18 | Ht 68.25 in | Wt 218.0 lb

## 2013-02-20 DIAGNOSIS — Z86010 Personal history of colonic polyps: Secondary | ICD-10-CM

## 2013-02-20 DIAGNOSIS — R7302 Impaired glucose tolerance (oral): Secondary | ICD-10-CM | POA: Insufficient documentation

## 2013-02-20 DIAGNOSIS — E785 Hyperlipidemia, unspecified: Secondary | ICD-10-CM

## 2013-02-20 DIAGNOSIS — Z0000 Encounter for general adult medical examination without abnormal findings: Secondary | ICD-10-CM

## 2013-02-20 DIAGNOSIS — R7309 Other abnormal glucose: Secondary | ICD-10-CM

## 2013-02-20 DIAGNOSIS — N400 Enlarged prostate without lower urinary tract symptoms: Secondary | ICD-10-CM

## 2013-02-20 DIAGNOSIS — I10 Essential (primary) hypertension: Secondary | ICD-10-CM

## 2013-02-20 HISTORY — DX: Impaired glucose tolerance (oral): R73.02

## 2013-02-20 MED ORDER — TAMSULOSIN HCL 0.4 MG PO CAPS: 0.4000 mg | ORAL_CAPSULE | Freq: Every day | ORAL | Status: DC

## 2013-02-20 MED ORDER — AMLODIPINE BESYLATE 5 MG PO TABS: 5.0000 mg | ORAL_TABLET | Freq: Every day | ORAL | Status: DC

## 2013-02-20 MED ORDER — VARDENAFIL HCL 20 MG PO TABS: 20.0000 mg | ORAL_TABLET | Freq: Every day | ORAL | Status: DC | PRN

## 2013-02-20 MED ORDER — SIMVASTATIN 20 MG PO TABS: 20.0000 mg | ORAL_TABLET | Freq: Every day | ORAL | Status: DC

## 2013-02-20 NOTE — Progress Notes (Signed)
Subjective:    Patient ID: Vincent Stevens, male    DOB: 1946/06/22, 67 y.o.   MRN: [id number]  HPI   67 year old patient who is seen today for a health maintenance examination. He has done quite well he has treated hypertension and dyslipidemia. He also has a history of colonic polyps and is scheduled for followup colonoscopy this year. No concerns or complaints. Has history of mild BPH which is controlled with alpha blocker therapy.  1. Risk factors, based on past  M,S,F history-  cardiovascular risk factors include hypertension and dyslipidemia.  2.  Physical activities: Remains quite active without exercise limitations. Continues to work part-time  3.  Depression/mood: No history depression or mood disorder  4.  Hearing: No deficits  5.  ADL's: Independent in all aspects of daily living  6.  Fall risk: Low  7.  Home safety: No problems identified  8.  Height weight, and visual acuity; height and weight stable no difficulty with visual acuity  9.  Counseling: Heart healthy diet regular exercise all encouraged followup colonoscopy this year encouraged  10. Lab orders based on risk factors: Laboratory profile reviewed 11. Referral : GI for colonoscopy  12. Care plan: Continue regular exercise heart healthy diet followup colonoscopy  13. Cognitive assessment: Alert and oriented with normal affect. No cognitive dysfunction.      Alcohol-Tobacco  Smoking Status: never   Allergies:  1) Sulfamethoxazole (Sulfamethoxazole)   Past History:  Past Medical History:   High Cholesterol  Allergies  Colonic polyps, hx of  Hypertension  Benign prostatic hypertrophy  suspected gout  impaired glucose tolerance   Past Surgical History:  Reviewed history from 09/27/2008 and no changes required.   right inguinal hernia repair in 1990  colonoscopy March 2008   Family History:  Reviewed history from 09/27/2008 and no changes required.   Family History Diabetes 1st degree  relative  Family History Hypertension  Family History of Cardiovascular disorder  father died age 2 of MI  Mother died age 61 complications of senile dementia  One brother, status post CABG h/o DM2 one sister, status post CABG  one sister deceased from melanoma   Social History:  Reviewed history from 09/27/2008 and no changes required.  Occupation: Airline pilot Rep  Married  discontinued tobacco, approximately 30 years ago  Retired  Smoking Status: never     Review of Systems  Constitutional: Negative for fever, chills, activity change, appetite change and fatigue.  HENT: Negative for hearing loss, ear pain, congestion, rhinorrhea, sneezing, mouth sores, trouble swallowing, neck pain, neck stiffness, dental problem, voice change, sinus pressure and tinnitus.   Eyes: Negative for photophobia, pain, redness and visual disturbance.  Respiratory: Negative for apnea, cough, choking, chest tightness, shortness of breath and wheezing.   Cardiovascular: Negative for chest pain, palpitations and leg swelling.  Gastrointestinal: Negative for nausea, vomiting, abdominal pain, diarrhea, constipation, blood in stool, abdominal distention, anal bleeding and rectal pain.  Genitourinary: Negative for dysuria, urgency, frequency, hematuria, flank pain, decreased urine volume, discharge, penile swelling, scrotal swelling, difficulty urinating, genital sores and testicular pain.  Musculoskeletal: Negative for myalgias, back pain, joint swelling, arthralgias and gait problem.  Skin: Negative for color change, rash and wound.  Neurological: Negative for dizziness, tremors, seizures, syncope, facial asymmetry, speech difficulty, weakness, light-headedness, numbness and headaches.  Hematological: Negative for adenopathy. Does not bruise/bleed easily.  Psychiatric/Behavioral: Negative for suicidal ideas, hallucinations, behavioral problems, confusion, sleep disturbance, self-injury, dysphoric mood, decreased  concentration and agitation. The patient is  not nervous/anxious.        Objective:   Physical Exam  Constitutional: He appears well-developed and well-nourished.  HENT:  Head: Normocephalic and atraumatic.  Right Ear: External ear normal.  Left Ear: External ear normal.  Nose: Nose normal.  Mouth/Throat: Oropharynx is clear and moist.  Eyes: Conjunctivae and EOM are normal. Pupils are equal, round, and reactive to light. No scleral icterus.  Neck: Normal range of motion. Neck supple. No JVD present. No thyromegaly present.  Cardiovascular: Regular rhythm, normal heart sounds and intact distal pulses.  Exam reveals no gallop and no friction rub.   No murmur heard. Pulmonary/Chest: Effort normal and breath sounds normal. He exhibits no tenderness.  Abdominal: Soft. Bowel sounds are normal. He exhibits no distension and no mass. There is no tenderness.  Genitourinary: Penis normal.  Prostate +2 enlarged  Musculoskeletal: Normal range of motion. He exhibits no edema and no tenderness.  Lymphadenopathy:    He has no cervical adenopathy.  Neurological: He is alert. He has normal reflexes. No cranial nerve deficit. Coordination normal.  Skin: Skin is warm and dry. No rash noted.  2 cm pigmented lesion in the left preauricular area consistent with a seborrheic dermatosis  Psychiatric: He has a normal mood and affect. His behavior is normal.          Assessment & Plan:   Preventive health examination Hypertension well controlled Dyslipidemia well controlled ED. Well controlled on Levitra Colonic polyps. Followup colonoscopy this year BPH reasonable control on alpha blocker therapy

## 2013-02-20 NOTE — Patient Instructions (Signed)
Limit your sodium (Salt) intake    It is important that you exercise regularly, at least 20 minutes 3 to 4 times per week.  If you develop chest pain or shortness of breath seek  medical attention.  Schedule your colonoscopy to help detect colon cancer.  Please check your blood pressure on a regular basis.  If it is consistently greater than 150/90, please make an office appointment.  Return in 6 months for follow-up    Dermatology evaluation

## 2013-02-23 ENCOUNTER — Other Ambulatory Visit: Payer: Self-pay | Admitting: Internal Medicine

## 2013-03-01 ENCOUNTER — Other Ambulatory Visit: Payer: Self-pay | Admitting: *Deleted

## 2013-03-01 MED ORDER — TAMSULOSIN HCL 0.4 MG PO CAPS: 0.4000 mg | ORAL_CAPSULE | Freq: Every day | ORAL | Status: DC

## 2013-10-04 ENCOUNTER — Encounter: Payer: Self-pay | Admitting: Internal Medicine

## 2013-10-04 ENCOUNTER — Ambulatory Visit (INDEPENDENT_AMBULATORY_CARE_PROVIDER_SITE_OTHER): Payer: Medicare Other | Admitting: Internal Medicine

## 2013-10-04 VITALS — BP 130/90 | HR 55 | Temp 97.8°F | Resp 20 | Wt 212.0 lb

## 2013-10-04 DIAGNOSIS — E785 Hyperlipidemia, unspecified: Secondary | ICD-10-CM

## 2013-10-04 DIAGNOSIS — I10 Essential (primary) hypertension: Secondary | ICD-10-CM

## 2013-10-04 DIAGNOSIS — J069 Acute upper respiratory infection, unspecified: Secondary | ICD-10-CM

## 2013-10-04 MED ORDER — HYDROCODONE-HOMATROPINE 5-1.5 MG/5ML PO SYRP: 5.0000 mL | ORAL_SOLUTION | Freq: Four times a day (QID) | ORAL | Status: DC | PRN

## 2013-10-04 NOTE — Patient Instructions (Signed)
Acute bronchitis symptoms for less than 10 days are generally not helped by antibiotics.  Take over-the-counter expectorants and cough medications such as  Mucinex DM.  Call if there is no improvement in 5 to 7 days or if he developed worsening cough, fever, or new symptoms, such as shortness of breath or chest pain.    

## 2013-10-04 NOTE — Progress Notes (Signed)
Subjective:    Patient ID: Vincent Stevens, male    DOB: 1946-04-23, 67 y.o.   MRN: [id number]  HPI  Pre-visit discussion using our clinic review tool. No additional management support is needed unless otherwise documented below in the visit note.  67 -year-old patient who is seen with a chief complaint of cough he has been ill for 7 days and earlier had fever chills and malaise. He spent last week and now mostly in bed. At the present time he is much improved except for primarily nocturnal cough. Cough is nonproductive. No further fever or chills. He feels that he is improving daily  Past Medical History  Diagnosis Date  . Hyperlipidemia   . Allergy   . Hypertension   . Gout   . Benign prostatic hypertrophy     History   Social History  . Marital Status: Single    Spouse Name: N/A    Number of Children: N/A  . Years of Education: N/A   Occupational History  . Not on file.   Social History Main Topics  . Smoking status: Former Smoker    Quit date: 11/29/1980  . Smokeless tobacco: Never Used  . Alcohol Use: No  . Drug Use: No  . Sexual Activity: Not on file   Other Topics Concern  . Not on file   Social History Narrative  . No narrative on file    Past Surgical History  Procedure Laterality Date  . Hernia repair      Family History  Problem Relation Age of Onset  . Diabetes    . Hypertension    . Heart attack Father   . Dementia Mother   . Cancer Sister     Allergies  Allergen Reactions  . Sulfamethoxazole     REACTION: unspecified    Current Outpatient Prescriptions on File Prior to Visit  Medication Sig Dispense Refill  . amLODipine (NORVASC) 5 MG tablet Take 1 tablet (5 mg total) by mouth daily.  90 tablet  3  . simvastatin (ZOCOR) 20 MG tablet Take 1 tablet (20 mg total) by mouth at bedtime.  90 tablet  3  . tamsulosin (FLOMAX) 0.4 MG CAPS Take 1 capsule (0.4 mg total) by mouth daily.  90 capsule  3  . vardenafil (LEVITRA) 20 MG tablet Take 1  tablet (20 mg total) by mouth daily as needed for erectile dysfunction.  10 tablet  6   No current facility-administered medications on file prior to visit.    BP 130/90  Pulse 55  Temp(Src) 97.8 F (36.6 C) (Oral)  Resp 20  Wt 212 lb (96.163 kg)  SpO2 98%     Review of Systems  Constitutional: Negative for fever, chills, appetite change and fatigue.  HENT: Negative for congestion, dental problem, ear pain, hearing loss, sore throat, tinnitus, trouble swallowing and voice change.   Eyes: Negative for pain, discharge and visual disturbance.  Respiratory: Positive for cough. Negative for chest tightness, wheezing and stridor.   Cardiovascular: Negative for chest pain, palpitations and leg swelling.  Gastrointestinal: Negative for nausea, vomiting, abdominal pain, diarrhea, constipation, blood in stool and abdominal distention.  Genitourinary: Negative for urgency, hematuria, flank pain, discharge, difficulty urinating and genital sores.  Musculoskeletal: Negative for arthralgias, back pain, gait problem, joint swelling, myalgias and neck stiffness.  Skin: Negative for rash.  Neurological: Negative for dizziness, syncope, speech difficulty, weakness, numbness and headaches.  Hematological: Negative for adenopathy. Does not bruise/bleed easily.  Psychiatric/Behavioral: Negative for behavioral  problems and dysphoric mood. The patient is not nervous/anxious.        Objective:   Physical Exam  Constitutional: He is oriented to person, place, and time. He appears well-developed.  HENT:  Head: Normocephalic.  Right Ear: External ear normal.  Left Ear: External ear normal.  Eyes: Conjunctivae and EOM are normal.  Neck: Normal range of motion.  Cardiovascular: Normal rate and normal heart sounds.   Pulmonary/Chest: Breath sounds normal.  O2 saturation 98  Abdominal: Bowel sounds are normal.  Musculoskeletal: Normal range of motion. He exhibits no edema and no tenderness.   Neurological: He is alert and oriented to person, place, and time.  Psychiatric: He has a normal mood and affect. His behavior is normal.          Assessment & Plan:   Resolving viral bronchitis with cough. Will treat symptomatically Hypertension stable

## 2013-10-12 ENCOUNTER — Telehealth: Payer: Self-pay | Admitting: Internal Medicine

## 2013-10-12 MED ORDER — HYDROCODONE-HOMATROPINE 5-1.5 MG/5ML PO SYRP: 5.0000 mL | ORAL_SOLUTION | Freq: Four times a day (QID) | ORAL | Status: AC | PRN

## 2013-10-12 NOTE — Telephone Encounter (Signed)
Hydromet 6 ounces 1 teaspoon  every 6 hours as needed for cough and congestion 

## 2013-10-12 NOTE — Telephone Encounter (Signed)
Pt needs another rx for hydrocodone cough syrup

## 2013-10-12 NOTE — Telephone Encounter (Signed)
Rx ready for pick up and patient is aware 

## 2013-10-16 ENCOUNTER — Ambulatory Visit (INDEPENDENT_AMBULATORY_CARE_PROVIDER_SITE_OTHER): Payer: Medicare Other | Admitting: Internal Medicine

## 2013-10-16 ENCOUNTER — Encounter: Payer: Self-pay | Admitting: Internal Medicine

## 2013-10-16 ENCOUNTER — Telehealth: Payer: Self-pay | Admitting: Internal Medicine

## 2013-10-16 VITALS — BP 100/64 | HR 60 | Temp 97.8°F | Resp 20 | Wt 209.0 lb

## 2013-10-16 DIAGNOSIS — J069 Acute upper respiratory infection, unspecified: Secondary | ICD-10-CM

## 2013-10-16 NOTE — Telephone Encounter (Signed)
Noted  

## 2013-10-16 NOTE — Progress Notes (Signed)
Subjective:    Patient ID: Vincent Stevens, male    DOB: 1946/09/01, 67 y.o.   MRN: [id number]  HPI Pre-visit discussion using our clinic review tool. No additional management support is needed unless otherwise documented below in the visit note.  67 year old patient who is seen today in followup. He now has an approximate three-week history of cough. This continues to be most bothersome at night and is relieved with antidepressant medication. He has nausea fatigue and general sense of unwellness. He has worked only one day the past 3 weeks. His appetite has been poor and there's been some modest weight loss. No fever chills or shortness of breath  Wt Readings from Last 3 Encounters:  10/16/13 209 lb (94.802 kg)  10/04/13 212 lb (96.163 kg)  02/20/13 218 lb (98.884 kg)   Past Medical History  Diagnosis Date  . Hyperlipidemia   . Allergy   . Hypertension   . Gout   . Benign prostatic hypertrophy     History   Social History  . Marital Status: Single    Spouse Name: N/A    Number of Children: N/A  . Years of Education: N/A   Occupational History  . Not on file.   Social History Main Topics  . Smoking status: Former Smoker    Quit date: 11/29/1980  . Smokeless tobacco: Never Used  . Alcohol Use: No  . Drug Use: No  . Sexual Activity: Not on file   Other Topics Concern  . Not on file   Social History Narrative  . No narrative on file    Past Surgical History  Procedure Laterality Date  . Hernia repair      Family History  Problem Relation Age of Onset  . Diabetes    . Hypertension    . Heart attack Father   . Dementia Mother   . Cancer Sister     Allergies  Allergen Reactions  . Sulfamethoxazole     REACTION: unspecified    Current Outpatient Prescriptions on File Prior to Visit  Medication Sig Dispense Refill  . amLODipine (NORVASC) 5 MG tablet Take 1 tablet (5 mg total) by mouth daily.  90 tablet  3  . HYDROcodone-homatropine (HYCODAN) 5-1.5 MG/5ML  syrup Take 5 mLs by mouth every 6 (six) hours as needed for cough.  120 mL  0  . simvastatin (ZOCOR) 20 MG tablet Take 1 tablet (20 mg total) by mouth at bedtime.  90 tablet  3  . tamsulosin (FLOMAX) 0.4 MG CAPS Take 1 capsule (0.4 mg total) by mouth daily.  90 capsule  3  . vardenafil (LEVITRA) 20 MG tablet Take 1 tablet (20 mg total) by mouth daily as needed for erectile dysfunction.  10 tablet  6   No current facility-administered medications on file prior to visit.    BP 100/64  Pulse 60  Temp(Src) 97.8 F (36.6 C) (Oral)  Resp 20  Wt 209 lb (94.802 kg)  SpO2 98%    Review of Systems  Constitutional: Positive for activity change, appetite change, fatigue and unexpected weight change. Negative for fever and chills.  HENT: Negative for congestion, dental problem, ear pain, hearing loss, sore throat, tinnitus, trouble swallowing and voice change.   Eyes: Negative for pain, discharge and visual disturbance.  Respiratory: Positive for cough. Negative for chest tightness, wheezing and stridor.   Cardiovascular: Negative for chest pain, palpitations and leg swelling.  Gastrointestinal: Negative for nausea, vomiting, abdominal pain, diarrhea, constipation, blood in stool  and abdominal distention.  Genitourinary: Negative for urgency, hematuria, flank pain, discharge, difficulty urinating and genital sores.  Musculoskeletal: Negative for arthralgias, back pain, gait problem, joint swelling, myalgias and neck stiffness.  Skin: Negative for rash.  Neurological: Positive for weakness. Negative for dizziness, syncope, speech difficulty, numbness and headaches.  Hematological: Negative for adenopathy. Does not bruise/bleed easily.  Psychiatric/Behavioral: Negative for behavioral problems and dysphoric mood. The patient is not nervous/anxious.        Objective:   Physical Exam  Constitutional: He is oriented to person, place, and time. He appears well-developed and well-nourished. No  distress.  HENT:  Head: Normocephalic.  Right Ear: External ear normal.  Left Ear: External ear normal.  Eyes: Conjunctivae and EOM are normal.  Neck: Normal range of motion.  Cardiovascular: Normal rate, regular rhythm and normal heart sounds.   Pulse 60  Pulmonary/Chest: Effort normal and breath sounds normal. No respiratory distress. He has no wheezes. He has no rales.  O2 saturation 98  Abdominal: Bowel sounds are normal.  Musculoskeletal: Normal range of motion. He exhibits no edema and no tenderness.  Neurological: He is alert and oriented to person, place, and time.  Psychiatric: He has a normal mood and affect. His behavior is normal.          Assessment & Plan:   Slow resolving URI with cough. Still no indication for antibiotic therapy. This was discussed with the patient and he is comfortable. He will call prior to the weekend if there is any worsening. We'll continue rest and fluids and supportive symptomatic care

## 2013-10-16 NOTE — Patient Instructions (Signed)
Acute bronchitis symptoms are generally not helped by antibiotics.  Take over-the-counter expectorants and cough medications such as  Mucinex DM.  Call if there is no improvement in 5 to 7 days or if he developed worsening cough, fever, or new symptoms, such as shortness of breath or chest pain. 

## 2013-10-16 NOTE — Telephone Encounter (Signed)
Patient Information:  Caller Name: [place]  Phone: [phone]  Patient: Vincent Stevens, Vincent Stevens  Gender: Male  DOB: [birth year]  Age: 67 Years  PCP: Eleonore Chiquito (Family Practice > 15yrs old)  Office Follow Up:  Does the office need to follow up with this patient?: No  Instructions For The Office: N/A  RN Note:  Pt had been seen in the office for hos cough but the cough has not improved. Had a couple of vomiting episodes last PM.  Symptoms  Reason For Call & Symptoms: Cough  Reviewed Health History In EMR: Yes  Reviewed Medications In EMR: Yes  Reviewed Allergies In EMR: Yes  Reviewed Surgeries / Procedures: Yes  Date of Onset of Symptoms: 09/25/2013  Treatments Tried: Codeine cough syrup  Treatments Tried Worked: No  Guideline(s) Used:  Cough  Disposition Per Guideline:   See Today in Office  Reason For Disposition Reached:   Severe coughing spells (e.g., whooping sound after coughing, vomiting after coughing)  Advice Given:  Reassurance  Coughing is the way that our lungs remove irritants and mucus. It helps protect our lungs from getting pneumonia.  Patient Will Follow Care Advice:  YES  Appointment Scheduled:  10/16/2013 11:00:00 Appointment Scheduled Provider:  Eleonore Chiquito (Family Practice > 43yrs old)

## 2013-10-16 NOTE — Progress Notes (Signed)
Pre-visit discussion using our clinic review tool. No additional management support is needed unless otherwise documented below in the visit note.  

## 2013-12-14 ENCOUNTER — Telehealth: Payer: Self-pay

## 2013-12-14 NOTE — Telephone Encounter (Signed)
Prescriptions should be handled thru [email or web address] --  fax number [phone]- pt is good for possible 60Days

## 2013-12-14 NOTE — Telephone Encounter (Signed)
Chart updated

## 2014-01-17 MED ORDER — AMLODIPINE BESYLATE 5 MG PO TABS: 5.0000 mg | ORAL_TABLET | Freq: Every day | ORAL | Status: DC

## 2014-01-17 MED ORDER — SIMVASTATIN 20 MG PO TABS: 20.0000 mg | ORAL_TABLET | Freq: Every day | ORAL | Status: DC

## 2014-01-17 MED ORDER — TAMSULOSIN HCL 0.4 MG PO CAPS: 0.4000 mg | ORAL_CAPSULE | Freq: Every day | ORAL | Status: DC

## 2014-01-17 NOTE — Telephone Encounter (Signed)
Spoke to pt told him due for physical in March, but will send Rx refills for 90 days to [facility]. Told pt to call back to schedule. Pt verbalized understanding.

## 2014-01-17 NOTE — Telephone Encounter (Signed)
Pt need new rx sent to [facility] amlodipine 5 mg #90,simvastatin 20 mg #90 and tamsulosin 20 mg #90 w/refills

## 2014-01-17 NOTE — Addendum Note (Signed)
Addended by: Marian Sorrow on: 01/17/2014 04:37 PM   Modules accepted: Orders

## 2014-03-26 ENCOUNTER — Telehealth: Payer: Self-pay | Admitting: Internal Medicine

## 2014-03-26 NOTE — Telephone Encounter (Signed)
Pt had to reschedule his physical appt on 04/12/14 due to dr. Raliegh Ip out of the office. Pt states he needs his physical in May and wants to know if dr. Raliegh Ip can work him in.

## 2014-03-26 NOTE — Telephone Encounter (Signed)
Please work him in  

## 2014-04-01 NOTE — Telephone Encounter (Signed)
appt scheduled for pt.  

## 2014-04-04 ENCOUNTER — Other Ambulatory Visit: Payer: Self-pay | Admitting: *Deleted

## 2014-04-04 DIAGNOSIS — Z0000 Encounter for general adult medical examination without abnormal findings: Secondary | ICD-10-CM

## 2014-04-05 ENCOUNTER — Other Ambulatory Visit (INDEPENDENT_AMBULATORY_CARE_PROVIDER_SITE_OTHER): Payer: Medicare HMO

## 2014-04-05 DIAGNOSIS — Z0000 Encounter for general adult medical examination without abnormal findings: Secondary | ICD-10-CM

## 2014-04-05 LAB — BASIC METABOLIC PANEL
BUN: 14 mg/dL (ref 6–23)
CHLORIDE: 106 meq/L (ref 96–112)
CO2: 28 mEq/L (ref 19–32)
CREATININE: 1.2 mg/dL (ref 0.4–1.5)
Calcium: 9.5 mg/dL (ref 8.4–10.5)
GFR: 65.66 mL/min (ref >60.00)
Glucose, Bld: 113 mg/dL — ABNORMAL HIGH (ref 70–99)
POTASSIUM: 5.2 meq/L — AB (ref 3.5–5.1)
Sodium: 141 mEq/L (ref 135–145)

## 2014-04-05 LAB — CBC WITH DIFFERENTIAL/PLATELET
BASOS PCT: 0.4 % (ref 0.0–3.0)
Basophils Absolute: 0 10*3/uL (ref 0.0–0.1)
EOS PCT: 3.8 % (ref 0.0–5.0)
Eosinophils Absolute: 0.2 10*3/uL (ref 0.0–0.7)
HEMATOCRIT: 50.5 % (ref 39.0–52.0)
Hemoglobin: 17 g/dL (ref 13.0–17.0)
Lymphocytes Relative: 29.8 % (ref 12.0–46.0)
Lymphs Abs: 1.9 10*3/uL (ref 0.7–4.0)
MCHC: 33.6 g/dL (ref 30.0–36.0)
MCV: 87.3 fl (ref 78.0–100.0)
Monocytes Absolute: 0.6 10*3/uL (ref 0.1–1.0)
Monocytes Relative: 9.4 % (ref 3.0–12.0)
NEUTROS PCT: 56.6 % (ref 43.0–77.0)
Neutro Abs: 3.6 10*3/uL (ref 1.4–7.7)
PLATELETS: 209 10*3/uL (ref 150.0–400.0)
RBC: 5.79 Mil/uL (ref 4.22–5.81)
RDW: 13.8 % (ref 11.5–15.5)
WBC: 6.4 10*3/uL (ref 4.0–10.5)

## 2014-04-05 LAB — HEPATIC FUNCTION PANEL
ALBUMIN: 4.4 g/dL (ref 3.5–5.2)
ALT: 25 U/L (ref 0–53)
AST: 27 U/L (ref 0–37)
Alkaline Phosphatase: 52 U/L (ref 39–117)
Bilirubin, Direct: 0.1 mg/dL (ref 0.0–0.3)
TOTAL PROTEIN: 7 g/dL (ref 6.0–8.3)
Total Bilirubin: 1 mg/dL (ref 0.2–1.2)

## 2014-04-05 LAB — POCT URINALYSIS DIPSTICK
Bilirubin, UA: NEGATIVE
GLUCOSE UA: NEGATIVE
Ketones, UA: NEGATIVE
Leukocytes, UA: NEGATIVE
NITRITE UA: NEGATIVE
PH UA: 7
PROTEIN UA: NEGATIVE
RBC UA: NEGATIVE
SPEC GRAV UA: 1.02
Urobilinogen, UA: 0.2

## 2014-04-05 LAB — LIPID PANEL
CHOLESTEROL: 187 mg/dL (ref 0–200)
HDL: 45.4 mg/dL (ref >39.00)
LDL CALC: 117 mg/dL — AB (ref 0–99)
TRIGLYCERIDES: 121 mg/dL (ref 0.0–149.0)
Total CHOL/HDL Ratio: 4
VLDL: 24.2 mg/dL (ref 0.0–40.0)

## 2014-04-05 LAB — PSA: PSA: 0.48 ng/mL (ref 0.10–4.00)

## 2014-04-05 LAB — TSH: TSH: 3.21 u[IU]/mL (ref 0.35–4.50)

## 2014-04-12 ENCOUNTER — Encounter: Payer: Medicare HMO | Admitting: Internal Medicine

## 2014-04-15 ENCOUNTER — Ambulatory Visit (INDEPENDENT_AMBULATORY_CARE_PROVIDER_SITE_OTHER): Payer: Medicare HMO | Admitting: Internal Medicine

## 2014-04-15 ENCOUNTER — Encounter: Payer: Self-pay | Admitting: Internal Medicine

## 2014-04-15 VITALS — BP 132/80 | HR 53 | Temp 98.0°F | Resp 20 | Ht 69.0 in | Wt 218.0 lb

## 2014-04-15 DIAGNOSIS — Z0000 Encounter for general adult medical examination without abnormal findings: Secondary | ICD-10-CM

## 2014-04-15 DIAGNOSIS — N400 Enlarged prostate without lower urinary tract symptoms: Secondary | ICD-10-CM

## 2014-04-15 DIAGNOSIS — R7302 Impaired glucose tolerance (oral): Secondary | ICD-10-CM

## 2014-04-15 DIAGNOSIS — I10 Essential (primary) hypertension: Secondary | ICD-10-CM

## 2014-04-15 DIAGNOSIS — Z23 Encounter for immunization: Secondary | ICD-10-CM

## 2014-04-15 DIAGNOSIS — R7309 Other abnormal glucose: Secondary | ICD-10-CM

## 2014-04-15 DIAGNOSIS — Z860100 Personal history of colon polyps, unspecified: Secondary | ICD-10-CM

## 2014-04-15 DIAGNOSIS — E785 Hyperlipidemia, unspecified: Secondary | ICD-10-CM

## 2014-04-15 NOTE — Patient Instructions (Signed)
Limit your sodium (Salt) intake  Please check your blood pressure on a regular basis.  If it is consistently greater than 150/90, please make an office appointment.    It is important that you exercise regularly, at least 20 minutes 3 to 4 times per week.  If you develop chest pain or shortness of breath seek  medical attention.  Return in one year for follow-up  

## 2014-04-15 NOTE — Progress Notes (Signed)
Subjective:    Patient ID: Vincent Stevens, male    DOB: 1946/08/30, 68 y.o.   MRN: [id number]  HPI  68 year-old patient who is seen today for a health maintenance examination.  He has done quite well he has treated hypertension and dyslipidemia. He also has a history of colonic polyps and last colonoscopy 2008.  EMR reviewed.  Initial colonoscopy 2003 that revealed hyperplastic polyps only. No concerns or complaints. Has history of mild BPH which is controlled with alpha blocker therapy.  Medicare wellness exam  1. Risk factors, based on past  M,S,F history-  cardiovascular risk factors include hypertension and dyslipidemia.  2.  Physical activities: Remains quite active without exercise limitations. Continues to work part-time  3.  Depression/mood: No history depression or mood disorder  4.  Hearing: No deficits  5.  ADL's: Independent in all aspects of daily living  6.  Fall risk: Low  7.  Home safety: No problems identified  8.  Height weight, and visual acuity; height and weight stable no difficulty with visual acuity  9.  Counseling: Heart healthy diet regular exercise all encouraged followup colonoscopy this year encouraged  10. Lab orders based on risk factors: Laboratory profile reviewed 11. Referral : GI for colonoscopy  12. Care plan: Continue regular exercise heart healthy diet followup colonoscopy  13. Cognitive assessment: Alert and oriented with normal affect. No cognitive dysfunction.   Wt Readings from Last 3 Encounters:  04/15/14 218 lb (98.884 kg)  10/16/13 209 lb (94.802 kg)  10/04/13 212 lb (96.163 kg)     Alcohol-Tobacco  Smoking Status: never   Allergies:  1) Sulfamethoxazole (Sulfamethoxazole)   Past History:  Past Medical History:   High Cholesterol  Allergies  Colonic polyps, hx of  Hypertension  Benign prostatic hypertrophy  suspected gout  impaired glucose tolerance   Past Surgical History:    right inguinal hernia repair in  1990  colonoscopy March 2008   Family History:    Family History Diabetes 1st degree relative  Family History Hypertension  Family History of Cardiovascular disorder  father died age 16 of MI  Mother died age 56 complications of senile dementia  One brother, status post CABG h/o DM2 one sister, status post CABG  one sister deceased from melanoma   Social History:   Occupation: Press photographer Rep  Married  discontinued tobacco, approximately 30 years ago  Retired  Smoking Status: never     Review of Systems  Constitutional: Negative for fever, chills, activity change, appetite change and fatigue.  HENT: Negative for congestion, dental problem, ear pain, hearing loss, mouth sores, rhinorrhea, sinus pressure, sneezing, tinnitus, trouble swallowing and voice change.   Eyes: Negative for photophobia, pain, redness and visual disturbance.  Respiratory: Negative for apnea, cough, choking, chest tightness, shortness of breath and wheezing.   Cardiovascular: Negative for chest pain, palpitations and leg swelling.  Gastrointestinal: Negative for nausea, vomiting, abdominal pain, diarrhea, constipation, blood in stool, abdominal distention, anal bleeding and rectal pain.  Genitourinary: Negative for dysuria, urgency, frequency, hematuria, flank pain, decreased urine volume, discharge, penile swelling, scrotal swelling, difficulty urinating, genital sores and testicular pain.  Musculoskeletal: Negative for arthralgias, back pain, gait problem, joint swelling, myalgias, neck pain and neck stiffness.  Skin: Negative for color change, rash and wound.  Neurological: Negative for dizziness, tremors, seizures, syncope, facial asymmetry, speech difficulty, weakness, light-headedness, numbness and headaches.  Hematological: Negative for adenopathy. Does not bruise/bleed easily.  Psychiatric/Behavioral: Negative for suicidal ideas, hallucinations, behavioral problems,  confusion, sleep disturbance,  self-injury, dysphoric mood, decreased concentration and agitation. The patient is not nervous/anxious.        Objective:   Physical Exam  Constitutional: He appears well-developed and well-nourished.  HENT:  Head: Normocephalic and atraumatic.  Right Ear: External ear normal.  Left Ear: External ear normal.  Nose: Nose normal.  Mouth/Throat: Oropharynx is clear and moist.  Eyes: Conjunctivae and EOM are normal. Pupils are equal, round, and reactive to light. No scleral icterus.  Neck: Normal range of motion. Neck supple. No JVD present. No thyromegaly present.  Cardiovascular: Regular rhythm, normal heart sounds and intact distal pulses.  Exam reveals no gallop and no friction rub.   No murmur heard. Pulmonary/Chest: Effort normal and breath sounds normal. He exhibits no tenderness.  Abdominal: Soft. Bowel sounds are normal. He exhibits no distension and no mass. There is no tenderness.  Genitourinary: Penis normal. Guaiac negative stool.  Prostate +2 enlarged  Musculoskeletal: Normal range of motion. He exhibits no edema and no tenderness.  Lymphadenopathy:    He has no cervical adenopathy.  Neurological: He is alert. He has normal reflexes. No cranial nerve deficit. Coordination normal.  Skin: Skin is warm and dry. No rash noted.  Psychiatric: He has a normal mood and affect. His behavior is normal.          Assessment & Plan:   Preventive health examination Hypertension well controlled Dyslipidemia well controlled ED. Well controlled on Levitra Colonic polyps.  Hyperplastic polyps.  10 year interval.  Recommended.  Followup colonoscopy 2018 BPH reasonable control on alpha blocker therapy Impaired glucose tolerance

## 2014-04-15 NOTE — Progress Notes (Signed)
Pre-visit discussion using our clinic review tool. No additional management support is needed unless otherwise documented below in the visit note.  

## 2014-04-16 ENCOUNTER — Telehealth: Payer: Self-pay | Admitting: Internal Medicine

## 2014-04-16 NOTE — Telephone Encounter (Signed)
Relevant patient education mailed to patient.  

## 2014-06-12 ENCOUNTER — Other Ambulatory Visit: Payer: Self-pay | Admitting: Internal Medicine

## 2014-11-06 ENCOUNTER — Other Ambulatory Visit: Payer: Self-pay | Admitting: Internal Medicine

## 2015-03-28 ENCOUNTER — Other Ambulatory Visit: Payer: Self-pay | Admitting: Internal Medicine

## 2015-05-27 ENCOUNTER — Other Ambulatory Visit: Payer: Self-pay | Admitting: *Deleted

## 2015-05-27 DIAGNOSIS — Z0000 Encounter for general adult medical examination without abnormal findings: Secondary | ICD-10-CM

## 2015-06-03 ENCOUNTER — Other Ambulatory Visit (INDEPENDENT_AMBULATORY_CARE_PROVIDER_SITE_OTHER): Payer: Commercial Managed Care - HMO

## 2015-06-03 DIAGNOSIS — Z0000 Encounter for general adult medical examination without abnormal findings: Secondary | ICD-10-CM

## 2015-06-03 LAB — CBC WITH DIFFERENTIAL/PLATELET
BASOS PCT: 0.5 % (ref 0.0–3.0)
Basophils Absolute: 0 10*3/uL (ref 0.0–0.1)
EOS ABS: 0.3 10*3/uL (ref 0.0–0.7)
Eosinophils Relative: 4.1 % (ref 0.0–5.0)
HCT: 52.2 % — ABNORMAL HIGH (ref 39.0–52.0)
HEMOGLOBIN: 17.5 g/dL — AB (ref 13.0–17.0)
Lymphocytes Relative: 29.8 % (ref 12.0–46.0)
Lymphs Abs: 2 10*3/uL (ref 0.7–4.0)
MCHC: 33.4 g/dL (ref 30.0–36.0)
MCV: 87.4 fl (ref 78.0–100.0)
MONO ABS: 0.6 10*3/uL (ref 0.1–1.0)
Monocytes Relative: 9.2 % (ref 3.0–12.0)
NEUTROS ABS: 3.8 10*3/uL (ref 1.4–7.7)
Neutrophils Relative %: 56.4 % (ref 43.0–77.0)
Platelets: 207 10*3/uL (ref 150.0–400.0)
RBC: 5.98 Mil/uL — AB (ref 4.22–5.81)
RDW: 14.3 % (ref 11.5–15.5)
WBC: 6.7 10*3/uL (ref 4.0–10.5)

## 2015-06-03 LAB — POCT URINALYSIS DIPSTICK
BILIRUBIN UA: NEGATIVE
Blood, UA: NEGATIVE
GLUCOSE UA: NEGATIVE
KETONES UA: NEGATIVE
LEUKOCYTES UA: NEGATIVE
Nitrite, UA: NEGATIVE
Protein, UA: NEGATIVE
Spec Grav, UA: 1.015
Urobilinogen, UA: 0.2
pH, UA: 7

## 2015-06-03 LAB — LIPID PANEL
CHOL/HDL RATIO: 4
Cholesterol: 173 mg/dL (ref 0–200)
HDL: 45 mg/dL (ref >39.00)
LDL Cholesterol: 105 mg/dL — ABNORMAL HIGH (ref 0–99)
NONHDL: 128
Triglycerides: 113 mg/dL (ref 0.0–149.0)
VLDL: 22.6 mg/dL (ref 0.0–40.0)

## 2015-06-03 LAB — COMPREHENSIVE METABOLIC PANEL
ALBUMIN: 4.5 g/dL (ref 3.5–5.2)
ALT: 19 U/L (ref 0–53)
AST: 24 U/L (ref 0–37)
Alkaline Phosphatase: 58 U/L (ref 39–117)
BUN: 12 mg/dL (ref 6–23)
CHLORIDE: 104 meq/L (ref 96–112)
CO2: 27 mEq/L (ref 19–32)
CREATININE: 1.12 mg/dL (ref 0.40–1.50)
Calcium: 9.7 mg/dL (ref 8.4–10.5)
GFR: 68.82 mL/min (ref >60.00)
GLUCOSE: 116 mg/dL — AB (ref 70–99)
Potassium: 4.9 mEq/L (ref 3.5–5.1)
SODIUM: 142 meq/L (ref 135–145)
Total Bilirubin: 0.7 mg/dL (ref 0.2–1.2)
Total Protein: 7.2 g/dL (ref 6.0–8.3)

## 2015-06-03 LAB — PSA: PSA: 0.42 ng/mL (ref 0.10–4.00)

## 2015-06-03 LAB — TSH: TSH: 4.07 u[IU]/mL (ref 0.35–4.50)

## 2015-06-09 ENCOUNTER — Ambulatory Visit (INDEPENDENT_AMBULATORY_CARE_PROVIDER_SITE_OTHER): Payer: Commercial Managed Care - HMO | Admitting: Internal Medicine

## 2015-06-09 ENCOUNTER — Encounter: Payer: Self-pay | Admitting: Internal Medicine

## 2015-06-09 VITALS — BP 142/90 | HR 62 | Temp 98.4°F | Resp 20 | Ht 68.0 in | Wt 212.0 lb

## 2015-06-09 DIAGNOSIS — Z23 Encounter for immunization: Secondary | ICD-10-CM | POA: Diagnosis not present

## 2015-06-09 DIAGNOSIS — Z86010 Personal history of colonic polyps: Secondary | ICD-10-CM | POA: Diagnosis not present

## 2015-06-09 DIAGNOSIS — R7302 Impaired glucose tolerance (oral): Secondary | ICD-10-CM

## 2015-06-09 DIAGNOSIS — L819 Disorder of pigmentation, unspecified: Secondary | ICD-10-CM

## 2015-06-09 DIAGNOSIS — I10 Essential (primary) hypertension: Secondary | ICD-10-CM | POA: Diagnosis not present

## 2015-06-09 DIAGNOSIS — Z0000 Encounter for general adult medical examination without abnormal findings: Secondary | ICD-10-CM

## 2015-06-09 MED ORDER — AMLODIPINE BESYLATE 5 MG PO TABS: 5.0000 mg | ORAL_TABLET | Freq: Every day | ORAL | Status: DC

## 2015-06-09 MED ORDER — VARDENAFIL HCL 20 MG PO TABS: 20.0000 mg | ORAL_TABLET | Freq: Every day | ORAL | Status: DC | PRN

## 2015-06-09 MED ORDER — SIMVASTATIN 20 MG PO TABS: 20.0000 mg | ORAL_TABLET | Freq: Every day | ORAL | Status: DC

## 2015-06-09 MED ORDER — TAMSULOSIN HCL 0.4 MG PO CAPS: 0.4000 mg | ORAL_CAPSULE | Freq: Every day | ORAL | Status: DC

## 2015-06-09 NOTE — Progress Notes (Signed)
Pre visit review using our clinic review tool, if applicable. No additional management support is needed unless otherwise documented below in the visit note. 

## 2015-06-09 NOTE — Progress Notes (Signed)
Subjective:    Patient ID: Vincent Stevens, male    DOB: 08-25-1946, 69 y.o.   MRN: [id number]  HPI  69  year-old patient who is seen today for a health maintenance examination.  He has done quite well he has treated hypertension and dyslipidemia. He also has a history of colonic polyps and last colonoscopy 2008.  EMR reviewed.  Initial colonoscopy 2003 that revealed hyperplastic polyps only. No concerns or complaints. Has history of mild BPH which is controlled with alpha blocker therapy. Complaints today include left shoulder pain present for the past 2 months that seems to be improving.  Medicare wellness exam  1. Risk factors, based on past  M,S,F history-  cardiovascular risk factors include hypertension and dyslipidemia.  2.  Physical activities: Remains quite active without exercise limitations. Continues to work part-time  3.  Depression/mood: No history depression or mood disorder  4.  Hearing: No deficits  5.  ADL's: Independent in all aspects of daily living  6.  Fall risk: Low  7.  Home safety: No problems identified  8.  Height weight, and visual acuity; height and weight stable no difficulty with visual acuity  9.  Counseling: Heart healthy diet regular exercise all encouraged followup colonoscopy this year encouraged  10. Lab orders based on risk factors: Laboratory profile reviewed 11. Referral : GI for colonoscopy  12. Care plan: Continue regular exercise heart healthy diet followup colonoscopy  13. Cognitive assessment: Alert and oriented with normal affect. No cognitive dysfunction.  14.  Preventive services will include colonoscopies at 10 year intervals.  He will have annual health examinations with screening lab and also to check stool for occult blood.  Anuli examinations recommended Patient was provided with a written and personalized care plan  15.  Provider list includes primary care medicine and GI as well as ophthalmology   Wt Readings from Last  3 Encounters:  06/09/15 212 lb (96.163 kg)  04/15/14 218 lb (98.884 kg)  10/16/13 209 lb (94.802 kg)     Alcohol-Tobacco  Smoking Status: never   Allergies:  1) Sulfamethoxazole (Sulfamethoxazole)   Past History:  Past Medical History:   High Cholesterol  Allergies  Colonic polyps, hx of  Hypertension  Benign prostatic hypertrophy  suspected gout  impaired glucose tolerance   Past Surgical History:    right inguinal hernia repair in 1990  colonoscopy March 2008   Family History:    Family History Diabetes 1st degree relative  Family History Hypertension  Family History of Cardiovascular disorder  father died age 70 of MI  Mother died age 34 complications of senile dementia  One brother, status post CABG h/o DM2 one sister, status post CABG  one sister deceased from melanoma   Social History:   Occupation: Press photographer Rep  Married  discontinued tobacco, approximately 30 years ago  Retired  Smoking Status: never     Review of Systems  Constitutional: Negative for fever, chills, activity change, appetite change and fatigue.  HENT: Negative for congestion, dental problem, ear pain, hearing loss, mouth sores, rhinorrhea, sinus pressure, sneezing, tinnitus, trouble swallowing and voice change.   Eyes: Negative for photophobia, pain, redness and visual disturbance.  Respiratory: Negative for apnea, cough, choking, chest tightness, shortness of breath and wheezing.   Cardiovascular: Negative for chest pain, palpitations and leg swelling.  Gastrointestinal: Negative for nausea, vomiting, abdominal pain, diarrhea, constipation, blood in stool, abdominal distention, anal bleeding and rectal pain.  Genitourinary: Negative for dysuria, urgency, frequency, hematuria, flank  pain, decreased urine volume, discharge, penile swelling, scrotal swelling, difficulty urinating, genital sores and testicular pain.  Musculoskeletal: Negative for myalgias, back pain, joint swelling,  arthralgias, gait problem, neck pain and neck stiffness.  Skin: Negative for color change, rash and wound.  Neurological: Negative for dizziness, tremors, seizures, syncope, facial asymmetry, speech difficulty, weakness, light-headedness, numbness and headaches.  Hematological: Negative for adenopathy. Does not bruise/bleed easily.  Psychiatric/Behavioral: Negative for suicidal ideas, hallucinations, behavioral problems, confusion, sleep disturbance, self-injury, dysphoric mood, decreased concentration and agitation. The patient is not nervous/anxious.        Objective:   Physical Exam  Constitutional: He appears well-developed and well-nourished.  HENT:  Head: Normocephalic and atraumatic.  Right Ear: External ear normal.  Left Ear: External ear normal.  Nose: Nose normal.  Mouth/Throat: Oropharynx is clear and moist.  Eyes: Conjunctivae and EOM are normal. Pupils are equal, round, and reactive to light. No scleral icterus.  Neck: Normal range of motion. Neck supple. No JVD present. No thyromegaly present.  Cardiovascular: Regular rhythm, normal heart sounds and intact distal pulses.  Exam reveals no gallop and no friction rub.   No murmur heard. Slight decreased right dorsalis pedis pulse  Pulmonary/Chest: Effort normal and breath sounds normal. He exhibits no tenderness.  Abdominal: Soft. Bowel sounds are normal. He exhibits no distension and no mass. There is no tenderness.  Genitourinary: Penis normal. Guaiac negative stool.  Prostate +2 enlarged  Musculoskeletal: Normal range of motion. He exhibits no edema or tenderness.  Left shoulder examined.  Full abduction of the left arm was painful at 90 External rotation resistance test slightly positive Internal and external rotation lag tests normal  Lymphadenopathy:    He has no cervical adenopathy.  Neurological: He is alert. He has normal reflexes. No cranial nerve deficit. Coordination normal.  Skin: Skin is warm and dry. No  rash noted.  3 x 1.5 cm heterogeneous, pigmented lesion in the left temporal area with irregular borders  Approximate 4 cm lipoma noted involving the left mid back area  Psychiatric: He has a normal mood and affect. His behavior is normal.          Assessment & Plan:   Preventive health examination Hypertension well controlled Dyslipidemia well controlled ED. Well controlled on Levitra Colonic polyps.  Hyperplastic polyps.  10 year interval.  Recommended.  Followup colonoscopy 2018 BPH reasonable control on alpha blocker therapy Impaired glucose tolerance Pigmented lesion, left temporal scalp area.  Will set her for dermatology evaluation Left rotator cuff tendinopathy.  This seems to be improving.  Will observe at this time.  Will consider orthopedic referral if clinical worsening

## 2015-06-09 NOTE — Patient Instructions (Signed)
Dermatology consultation as discussed  Limit your sodium (Salt) intake  Please check your blood pressure on a regular basis.  If it is consistently greater than 150/90, please make an office appointment.    It is important that you exercise regularly, at least 20 minutes 3 to 4 times per week.  If you develop chest pain or shortness of breath seek  medical attention.  Health Maintenance A healthy lifestyle and preventative care can promote health and wellness.  Maintain regular health, dental, and eye exams.  Eat a healthy diet. Foods like vegetables, fruits, whole grains, low-fat dairy products, and lean protein foods contain the nutrients you need and are low in calories. Decrease your intake of foods high in solid fats, added sugars, and salt. Get information about a proper diet from your health care provider, if necessary.  Regular physical exercise is one of the most important things you can do for your health. Most adults should get at least 150 minutes of moderate-intensity exercise (any activity that increases your heart rate and causes you to sweat) each week. In addition, most adults need muscle-strengthening exercises on 2 or more days a week.   Maintain a healthy weight. The body mass index (BMI) is a screening tool to identify possible weight problems. It provides an estimate of body fat based on height and weight. Your health care provider can find your BMI and can help you achieve or maintain a healthy weight. For males 20 years and older:  A BMI below 18.5 is considered underweight.  A BMI of 18.5 to 24.9 is normal.  A BMI of 25 to 29.9 is considered overweight.  A BMI of 30 and above is considered obese.  Maintain normal blood lipids and cholesterol by exercising and minimizing your intake of saturated fat. Eat a balanced diet with plenty of fruits and vegetables. Blood tests for lipids and cholesterol should begin at age 84 and be repeated every 5 years. If your lipid or  cholesterol levels are high, you are over age 55, or you are at high risk for heart disease, you may need your cholesterol levels checked more frequently.Ongoing high lipid and cholesterol levels should be treated with medicines if diet and exercise are not working.  If you smoke, find out from your health care provider how to quit. If you do not use tobacco, do not start.  Lung cancer screening is recommended for adults aged 69-80 years who are at high risk for developing lung cancer because of a history of smoking. A yearly low-dose CT scan of the lungs is recommended for people who have at least a 30-pack-year history of smoking and are current smokers or have quit within the past 15 years. A pack year of smoking is smoking an average of 1 pack of cigarettes a day for 1 year (for example, a 30-pack-year history of smoking could mean smoking 1 pack a day for 30 years or 2 packs a day for 15 years). Yearly screening should continue until the smoker has stopped smoking for at least 15 years. Yearly screening should be stopped for people who develop a health problem that would prevent them from having lung cancer treatment.  If you choose to drink alcohol, do not have more than 2 drinks per day. One drink is considered to be 12 oz (360 mL) of beer, 5 oz (150 mL) of wine, or 1.5 oz (45 mL) of liquor.  Avoid the use of street drugs. Do not share needles with anyone.  Ask for help if you need support or instructions about stopping the use of drugs.  High blood pressure causes heart disease and increases the risk of stroke. Blood pressure should be checked at least every 1-2 years. Ongoing high blood pressure should be treated with medicines if weight loss and exercise are not effective.  If you are 73-15 years old, ask your health care provider if you should take aspirin to prevent heart disease.  Diabetes screening involves taking a blood sample to check your fasting blood sugar level. This should be done  once every 3 years after age 78 if you are at a normal weight and without risk factors for diabetes. Testing should be considered at a younger age or be carried out more frequently if you are overweight and have at least 1 risk factor for diabetes.  Colorectal cancer can be detected and often prevented. Most routine colorectal cancer screening begins at the age of 46 and continues through age 32. However, your health care provider may recommend screening at an earlier age if you have risk factors for colon cancer. On a yearly basis, your health care provider may provide home test kits to check for hidden blood in the stool. A small camera at the end of a tube may be used to directly examine the colon (sigmoidoscopy or colonoscopy) to detect the earliest forms of colorectal cancer. Talk to your health care provider about this at age 34 when routine screening begins. A direct exam of the colon should be repeated every 5-10 years through age 96, unless early forms of precancerous polyps or small growths are found.  People who are at an increased risk for hepatitis B should be screened for this virus. You are considered at high risk for hepatitis B if:  You were born in a country where hepatitis B occurs often. Talk with your health care provider about which countries are considered high risk.  Your parents were born in a high-risk country and you have not received a shot to protect against hepatitis B (hepatitis B vaccine).  You have HIV or AIDS.  You use needles to inject street drugs.  You live with, or have sex with, someone who has hepatitis B.  You are a man who has sex with other men (MSM).  You get hemodialysis treatment.  You take certain medicines for conditions like cancer, organ transplantation, and autoimmune conditions.  Hepatitis C blood testing is recommended for all people born from [birth year] through 1966 and any individual with known risk factors for hepatitis C.  Healthy men should  no longer receive prostate-specific antigen (PSA) blood tests as part of routine cancer screening. Talk to your health care provider about prostate cancer screening.  Testicular cancer screening is not recommended for adolescents or adult males who have no symptoms. Screening includes self-exam, a health care provider exam, and other screening tests. Consult with your health care provider about any symptoms you have or any concerns you have about testicular cancer.  Practice safe sex. Use condoms and avoid high-risk sexual practices to reduce the spread of sexually transmitted infections (STIs).  You should be screened for STIs, including gonorrhea and chlamydia if:  You are sexually active and are younger than 24 years.  You are older than 24 years, and your health care provider tells you that you are at risk for this type of infection.  Your sexual activity has changed since you were last screened, and you are at an increased risk for chlamydia  or gonorrhea. Ask your health care provider if you are at risk.  If you are at risk of being infected with HIV, it is recommended that you take a prescription medicine daily to prevent HIV infection. This is called pre-exposure prophylaxis (PrEP). You are considered at risk if:  You are a man who has sex with other men (MSM).  You are a heterosexual man who is sexually active with multiple partners.  You take drugs by injection.  You are sexually active with a partner who has HIV.  Talk with your health care provider about whether you are at high risk of being infected with HIV. If you choose to begin PrEP, you should first be tested for HIV. You should then be tested every 3 months for as long as you are taking PrEP.  Use sunscreen. Apply sunscreen liberally and repeatedly throughout the day. You should seek shade when your shadow is shorter than you. Protect yourself by wearing long sleeves, pants, a wide-brimmed hat, and sunglasses year round  whenever you are outdoors.  Tell your health care provider of new moles or changes in moles, especially if there is a change in shape or color. Also, tell your health care provider if a mole is larger than the size of a pencil eraser.  A one-time screening for abdominal aortic aneurysm (AAA) and surgical repair of large AAAs by ultrasound is recommended for men aged 58-75 years who are current or former smokers.  Stay current with your vaccines (immunizations). Document Released: 05/13/2008 Document Revised: 11/20/2013 Document Reviewed: 04/12/2011 [facility] Patient Information 2015 [place], [place]. This information is not intended to replace advice given to you by your health care provider. Make sure you discuss any questions you have with your health care provider.

## 2015-07-08 ENCOUNTER — Other Ambulatory Visit: Payer: Self-pay | Admitting: Dermatology

## 2015-07-08 DIAGNOSIS — C4339 Malignant melanoma of other parts of face: Secondary | ICD-10-CM | POA: Diagnosis not present

## 2015-07-08 DIAGNOSIS — L820 Inflamed seborrheic keratosis: Secondary | ICD-10-CM | POA: Diagnosis not present

## 2015-07-08 DIAGNOSIS — L814 Other melanin hyperpigmentation: Secondary | ICD-10-CM | POA: Diagnosis not present

## 2015-08-19 DIAGNOSIS — D0339 Melanoma in situ of other parts of face: Secondary | ICD-10-CM | POA: Diagnosis not present

## 2015-08-19 DIAGNOSIS — C4339 Malignant melanoma of other parts of face: Secondary | ICD-10-CM | POA: Diagnosis not present

## 2015-09-24 ENCOUNTER — Encounter: Payer: Self-pay | Admitting: Internal Medicine

## 2016-01-22 ENCOUNTER — Ambulatory Visit (INDEPENDENT_AMBULATORY_CARE_PROVIDER_SITE_OTHER)
Admission: RE | Admit: 2016-01-22 | Discharge: 2016-01-22 | Disposition: A | Discharge status: Home / Self Care | Payer: Commercial Managed Care - HMO | Source: Ambulatory Visit | Attending: Family Medicine | Admitting: Family Medicine

## 2016-01-22 ENCOUNTER — Encounter: Payer: Self-pay | Admitting: Family Medicine

## 2016-01-22 ENCOUNTER — Ambulatory Visit (INDEPENDENT_AMBULATORY_CARE_PROVIDER_SITE_OTHER): Payer: Commercial Managed Care - HMO | Admitting: Family Medicine

## 2016-01-22 VITALS — BP 120/80 | HR 76 | Temp 98.5°F | Ht 68.0 in | Wt 210.1 lb

## 2016-01-22 DIAGNOSIS — R05 Cough: Secondary | ICD-10-CM

## 2016-01-22 DIAGNOSIS — R0600 Dyspnea, unspecified: Secondary | ICD-10-CM | POA: Diagnosis not present

## 2016-01-22 DIAGNOSIS — R059 Cough, unspecified: Secondary | ICD-10-CM

## 2016-01-22 MED ORDER — BENZONATATE 100 MG PO CAPS: 100.0000 mg | ORAL_CAPSULE | Freq: Two times a day (BID) | ORAL | Status: DC | PRN

## 2016-01-22 MED ORDER — FLUTICASONE PROPIONATE 50 MCG/ACT NA SUSP: 2.0000 | Freq: Every day | NASAL | Status: DC

## 2016-01-22 NOTE — Patient Instructions (Signed)
Before you leave: -X-ray sheet -Schedule follow-up with Dr. Maudie Mercury or Dr. Burnice Logan in 1 month  Go get the chest x-ray.  Start Flonase 2 sprays each nostril daily for 1 month, then 1 spray each nostril daily.  Use the cough medication, Tessalon, as needed per instructions.  Follow-up as planned in sooner if needed.

## 2016-01-22 NOTE — Progress Notes (Signed)
Pre visit review using our clinic review tool, if applicable. No additional management support is needed unless otherwise documented below in the visit note. 

## 2016-01-22 NOTE — Progress Notes (Signed)
HPI:  Vincent Stevens is a pleasant 70 year old with a history of seasonal allergies, here for an acute visit for a chronic cough. He reports this cough started in mid December 2016 following a flulike illness. He reports he has had postnasal drip, sneezing, itchy nose and occasional coughing spells for about 2 months. He admits to a history of seasonal allergies and uses an antihistamine occasionally. Denies fevers, malaise, weight loss, hemoptysis or shortness of breath. Admits to a remote smoking history. He is worried about lung cancer as his brother died from this.   ROS: See pertinent positives and negatives per HPI.  Past Medical History  Diagnosis Date  . Hyperlipidemia   . Allergy   . Hypertension   . Gout   . Benign prostatic hypertrophy     Past Surgical History  Procedure Laterality Date  . Hernia repair      Family History  Problem Relation Age of Onset  . Diabetes    . Hypertension    . Heart attack Father   . Dementia Mother   . Cancer Sister     Social History   Social History  . Marital Status: Single    Spouse Name: N/A  . Number of Children: N/A  . Years of Education: N/A   Social History Main Topics  . Smoking status: Former Smoker    Quit date: 11/29/1980  . Smokeless tobacco: Never Used  . Alcohol Use: No  . Drug Use: No  . Sexual Activity: Not Asked   Other Topics Concern  . None   Social History Narrative     Current outpatient prescriptions:  .  amLODipine (NORVASC) 5 MG tablet, Take 1 tablet (5 mg total) by mouth daily., Disp: 90 tablet, Rfl: 3 .  ibuprofen (ADVIL,MOTRIN) 200 MG tablet, Take 400 mg by mouth every 6 (six) hours as needed., Disp: , Rfl:  .  Multiple Vitamin (MULTIVITAMIN) tablet, Take 1 tablet by mouth daily., Disp: , Rfl:  .  simvastatin (ZOCOR) 20 MG tablet, Take 1 tablet (20 mg total) by mouth at bedtime., Disp: 90 tablet, Rfl: 3 .  tamsulosin (FLOMAX) 0.4 MG CAPS capsule, Take 1 capsule (0.4 mg total) by mouth daily.,  Disp: 90 capsule, Rfl: 3 .  vardenafil (LEVITRA) 20 MG tablet, Take 1 tablet (20 mg total) by mouth daily as needed for erectile dysfunction., Disp: 10 tablet, Rfl: 2 .  benzonatate (TESSALON) 100 MG capsule, Take 1 capsule (100 mg total) by mouth 2 (two) times daily as needed for cough., Disp: 20 capsule, Rfl: 0 .  fluticasone (FLONASE) 50 MCG/ACT nasal spray, Place 2 sprays into both nostrils daily., Disp: 16 g, Rfl: 1  EXAM:  Filed Vitals:   01/22/16 1340  BP: 120/80  Pulse: 76  Temp: 98.5 F (36.9 C)    Body mass index is 31.95 kg/(m^2).  GENERAL: vitals reviewed and listed above, alert, oriented, appears well hydrated and in no acute distress  HEENT: atraumatic, conjunttiva clear, no obvious abnormalities on inspection of external nose and ears, normal appearance of ear canals and TMs, clear nasal congestion, mild post oropharyngeal erythema with PND, no tonsillar edema or exudate, no sinus TTP  NECK: no obvious masses on inspection  LUNGS: clear to auscultation bilaterally, no wheezes, rales or rhonchi, good air movement  CV: HRRR, no peripheral edema  MS: moves all extremities without noticeable abnormality  PSYCH: pleasant and cooperative, no obvious depression or anxiety  ASSESSMENT AND PLAN:  Discussed the following assessment and plan:  Cough - Plan: DG Chest 2 View  PND (paroxysmal nocturnal dyspnea)  -Discussed common causes of a chronic cough and suspect postnasal drip from allergic rhinitis most likely versus postinfectious -We will obtain chest x-ray and initiate INS, prn Tessalon and follow-up in 1 month. -Patient advised to return or notify a doctor immediately if symptoms worsen or persist or new concerns arise.  Patient Instructions  Before you leave: -X-ray sheet -Schedule follow-up with Dr. Maudie Mercury or Dr. Burnice Logan in 1 month  Go get the chest x-ray.  Start Flonase 2 sprays each nostril daily for 1 month, then 1 spray each nostril daily.  Use  the cough medication, Tessalon, as needed per instructions.  Follow-up as planned in sooner if needed.    Colin Benton R.

## 2016-02-20 ENCOUNTER — Encounter: Payer: Self-pay | Admitting: Internal Medicine

## 2016-02-20 ENCOUNTER — Ambulatory Visit (INDEPENDENT_AMBULATORY_CARE_PROVIDER_SITE_OTHER): Payer: Commercial Managed Care - HMO | Admitting: Internal Medicine

## 2016-02-20 VITALS — BP 132/80 | HR 57 | Temp 97.9°F | Resp 20 | Ht 68.0 in | Wt 212.0 lb

## 2016-02-20 DIAGNOSIS — R7302 Impaired glucose tolerance (oral): Secondary | ICD-10-CM

## 2016-02-20 DIAGNOSIS — I10 Essential (primary) hypertension: Secondary | ICD-10-CM

## 2016-02-20 NOTE — Patient Instructions (Signed)
Limit your sodium (Salt) intake    It is important that you exercise regularly, at least 20 minutes 3 to 4 times per week.  If you develop chest pain or shortness of breath seek  medical attention.  Please check your blood pressure on a regular basis.  If it is consistently greater than 150/90, please make an office appointment.  Return in 4 months for follow-up  

## 2016-02-20 NOTE — Progress Notes (Signed)
Pre visit review using our clinic review tool, if applicable. No additional management support is needed unless otherwise documented below in the visit note. 

## 2016-02-20 NOTE — Progress Notes (Signed)
Subjective:    Patient ID: Vincent Stevens, male    DOB: 1946/09/21, 70 y.o.   MRN: [id number]  HPI  70 year old patient who has essential hypertension who is seen today for follow-up of cough.  This has essentially resolved.  This was felt to be related to upper airway cough syndrome.  The rhinorrhea has improved with fluticasone and antihistamines.  He does have remote tobacco history but discontinued smoking in his late twenties.  Chest x-ray was normal. He has essential hypertension which has been well controlled.  No other concerns or complaints  Past Medical History  Diagnosis Date  . Hyperlipidemia   . Allergy   . Hypertension   . Gout   . Benign prostatic hypertrophy     Social History   Social History  . Marital Status: Single    Spouse Name: N/A  . Number of Children: N/A  . Years of Education: N/A   Occupational History  . Not on file.   Social History Main Topics  . Smoking status: Former Smoker    Quit date: 11/29/1980  . Smokeless tobacco: Never Used  . Alcohol Use: No  . Drug Use: No  . Sexual Activity: Not on file   Other Topics Concern  . Not on file   Social History Narrative    Past Surgical History  Procedure Laterality Date  . Hernia repair      Family History  Problem Relation Age of Onset  . Diabetes    . Hypertension    . Heart attack Father   . Dementia Mother   . Cancer Sister     Allergies  Allergen Reactions  . Sulfamethoxazole     REACTION: unspecified    Current Outpatient Prescriptions on File Prior to Visit  Medication Sig Dispense Refill  . amLODipine (NORVASC) 5 MG tablet Take 1 tablet (5 mg total) by mouth daily. 90 tablet 3  . fluticasone (FLONASE) 50 MCG/ACT nasal spray Place 2 sprays into both nostrils daily. 16 g 1  . ibuprofen (ADVIL,MOTRIN) 200 MG tablet Take 400 mg by mouth every 6 (six) hours as needed.    . Multiple Vitamin (MULTIVITAMIN) tablet Take 1 tablet by mouth daily.    . simvastatin (ZOCOR) 20  MG tablet Take 1 tablet (20 mg total) by mouth at bedtime. 90 tablet 3  . tamsulosin (FLOMAX) 0.4 MG CAPS capsule Take 1 capsule (0.4 mg total) by mouth daily. 90 capsule 3  . vardenafil (LEVITRA) 20 MG tablet Take 1 tablet (20 mg total) by mouth daily as needed for erectile dysfunction. 10 tablet 2   No current facility-administered medications on file prior to visit.    BP 132/80 mmHg  Pulse 57  Temp(Src) 97.9 F (36.6 C) (Oral)  Resp 20  Ht 5\' 8"  (1.727 m)  Wt 212 lb (96.163 kg)  BMI 32.24 kg/m2  SpO2 99%     Review of Systems  Constitutional: Negative for fever, chills, appetite change and fatigue.  HENT: Positive for postnasal drip. Negative for congestion, dental problem, ear pain, hearing loss, sore throat, tinnitus, trouble swallowing and voice change.   Eyes: Negative for pain, discharge and visual disturbance.  Respiratory: Positive for cough. Negative for chest tightness, wheezing and stridor.   Cardiovascular: Negative for chest pain, palpitations and leg swelling.  Gastrointestinal: Negative for nausea, vomiting, abdominal pain, diarrhea, constipation, blood in stool and abdominal distention.  Genitourinary: Negative for urgency, hematuria, flank pain, discharge, difficulty urinating and genital sores.  Musculoskeletal:  Negative for myalgias, back pain, joint swelling, arthralgias, gait problem and neck stiffness.  Skin: Negative for rash.  Neurological: Negative for dizziness, syncope, speech difficulty, weakness, numbness and headaches.  Hematological: Negative for adenopathy. Does not bruise/bleed easily.  Psychiatric/Behavioral: Negative for behavioral problems and dysphoric mood. The patient is not nervous/anxious.        Objective:   Physical Exam  Constitutional: He is oriented to person, place, and time. He appears well-developed.  HENT:  Head: Normocephalic.  Right Ear: External ear normal.  Left Ear: External ear normal.  Eyes: Conjunctivae and EOM  are normal.  Neck: Normal range of motion.  Cardiovascular: Normal rate and normal heart sounds.   Pulmonary/Chest: Breath sounds normal.  Abdominal: Bowel sounds are normal.  Musculoskeletal: Normal range of motion. He exhibits no edema or tenderness.  Neurological: He is alert and oriented to person, place, and time.  Psychiatric: He has a normal mood and affect. His behavior is normal.          Assessment & Plan:   History chronic cough.  Resolved Essential hypertension History of allergic rhinitis Dyslipidemia   CPX in 4 months

## 2016-03-08 DIAGNOSIS — L821 Other seborrheic keratosis: Secondary | ICD-10-CM | POA: Diagnosis not present

## 2016-03-08 DIAGNOSIS — Z85820 Personal history of malignant melanoma of skin: Secondary | ICD-10-CM | POA: Diagnosis not present

## 2016-03-08 DIAGNOSIS — D1801 Hemangioma of skin and subcutaneous tissue: Secondary | ICD-10-CM | POA: Diagnosis not present

## 2016-06-16 ENCOUNTER — Other Ambulatory Visit (INDEPENDENT_AMBULATORY_CARE_PROVIDER_SITE_OTHER): Payer: Commercial Managed Care - HMO

## 2016-06-16 DIAGNOSIS — Z0000 Encounter for general adult medical examination without abnormal findings: Secondary | ICD-10-CM | POA: Diagnosis not present

## 2016-06-16 LAB — POC URINALSYSI DIPSTICK (AUTOMATED)
Bilirubin, UA: NEGATIVE
Glucose, UA: NEGATIVE
Ketones, UA: NEGATIVE
LEUKOCYTES UA: NEGATIVE
NITRITE UA: NEGATIVE
PH UA: 5.5
PROTEIN UA: NEGATIVE
RBC UA: NEGATIVE
Spec Grav, UA: 1.015
UROBILINOGEN UA: 0.2

## 2016-06-16 LAB — HEPATIC FUNCTION PANEL
ALBUMIN: 4.4 g/dL (ref 3.5–5.2)
ALT: 16 U/L (ref 0–53)
AST: 21 U/L (ref 0–37)
Alkaline Phosphatase: 54 U/L (ref 39–117)
BILIRUBIN TOTAL: 0.7 mg/dL (ref 0.2–1.2)
Bilirubin, Direct: 0.1 mg/dL (ref 0.0–0.3)
TOTAL PROTEIN: 6.7 g/dL (ref 6.0–8.3)

## 2016-06-16 LAB — BASIC METABOLIC PANEL
BUN: 14 mg/dL (ref 6–23)
CHLORIDE: 105 meq/L (ref 96–112)
CO2: 29 mEq/L (ref 19–32)
Calcium: 9.4 mg/dL (ref 8.4–10.5)
Creatinine, Ser: 1.11 mg/dL (ref 0.40–1.50)
GFR: 69.33 mL/min (ref >60.00)
GLUCOSE: 108 mg/dL — AB (ref 70–99)
POTASSIUM: 4.8 meq/L (ref 3.5–5.1)
SODIUM: 141 meq/L (ref 135–145)

## 2016-06-16 LAB — CBC WITH DIFFERENTIAL/PLATELET
BASOS PCT: 0.4 % (ref 0.0–3.0)
Basophils Absolute: 0 10*3/uL (ref 0.0–0.1)
EOS PCT: 3.6 % (ref 0.0–5.0)
Eosinophils Absolute: 0.2 10*3/uL (ref 0.0–0.7)
HCT: 50.3 % (ref 39.0–52.0)
HEMOGLOBIN: 16.8 g/dL (ref 13.0–17.0)
Lymphocytes Relative: 28.7 % (ref 12.0–46.0)
Lymphs Abs: 1.8 10*3/uL (ref 0.7–4.0)
MCHC: 33.3 g/dL (ref 30.0–36.0)
MCV: 86.3 fl (ref 78.0–100.0)
MONO ABS: 0.6 10*3/uL (ref 0.1–1.0)
MONOS PCT: 9.1 % (ref 3.0–12.0)
Neutro Abs: 3.7 10*3/uL (ref 1.4–7.7)
Neutrophils Relative %: 58.2 % (ref 43.0–77.0)
Platelets: 216 10*3/uL (ref 150.0–400.0)
RBC: 5.82 Mil/uL — ABNORMAL HIGH (ref 4.22–5.81)
RDW: 14.2 % (ref 11.5–15.5)
WBC: 6.4 10*3/uL (ref 4.0–10.5)

## 2016-06-16 LAB — LIPID PANEL
CHOLESTEROL: 162 mg/dL (ref 0–200)
HDL: 45.1 mg/dL (ref >39.00)
LDL CALC: 98 mg/dL (ref 0–99)
NonHDL: 117.3
TRIGLYCERIDES: 97 mg/dL (ref 0.0–149.0)
Total CHOL/HDL Ratio: 4
VLDL: 19.4 mg/dL (ref 0.0–40.0)

## 2016-06-16 LAB — PSA: PSA: 0.4 ng/mL (ref 0.10–4.00)

## 2016-06-16 LAB — TSH: TSH: 3.4 u[IU]/mL (ref 0.35–4.50)

## 2016-06-21 ENCOUNTER — Ambulatory Visit (INDEPENDENT_AMBULATORY_CARE_PROVIDER_SITE_OTHER): Payer: Commercial Managed Care - HMO | Admitting: Internal Medicine

## 2016-06-21 ENCOUNTER — Encounter: Payer: Self-pay | Admitting: Internal Medicine

## 2016-06-21 VITALS — BP 130/70 | HR 53 | Temp 97.9°F | Ht 67.75 in | Wt 207.0 lb

## 2016-06-21 DIAGNOSIS — E785 Hyperlipidemia, unspecified: Secondary | ICD-10-CM | POA: Diagnosis not present

## 2016-06-21 DIAGNOSIS — I10 Essential (primary) hypertension: Secondary | ICD-10-CM | POA: Diagnosis not present

## 2016-06-21 DIAGNOSIS — R7302 Impaired glucose tolerance (oral): Secondary | ICD-10-CM

## 2016-06-21 DIAGNOSIS — Z0000 Encounter for general adult medical examination without abnormal findings: Secondary | ICD-10-CM | POA: Diagnosis not present

## 2016-06-21 NOTE — Progress Notes (Signed)
Subjective:    Patient ID: Vincent Stevens, male    DOB: [birth year], 70 y.o.   MRN: [id number]  HPI  70  year-old patient who is seen today for a health maintenance examination.  He has done quite well he has treated hypertension and dyslipidemia. He also has a history of colonic polyps and last colonoscopy 2008.  EMR reviewed.  Initial colonoscopy 2003 that revealed hyperplastic polyps only. No concerns or complaints. Has history of mild BPH which is controlled with alpha blocker therapy.   Medicare wellness exam  1. Risk factors, based on past  M,S,F history-  cardiovascular risk factors include hypertension and dyslipidemia.  2.  Physical activities: Remains quite active without exercise limitations. Continues to work part-time  3.  Depression/mood: No history depression or mood disorder  4.  Hearing: No deficits  5.  ADL's: Independent in all aspects of daily living  6.  Fall risk: Low  7.  Home safety: No problems identified  8.  Height weight, and visual acuity; height and weight stable no difficulty with visual acuity  9.  Counseling: Heart healthy diet regular exercise all encouraged followup colonoscopy this year encouraged  10. Lab orders based on risk factors: Laboratory profile reviewed 11. Referral : GI for colonoscopy  12. Care plan: Continue regular exercise heart healthy diet followup colonoscopy  13. Cognitive assessment: Alert and oriented with normal affect. No cognitive dysfunction.  14.  Preventive services will include colonoscopies at 10 year intervals.  He will have annual health examinations with screening lab and also to check stool for occult blood.  Anuli examinations recommended Patient was provided with a written and personalized care plan  15.  Provider list includes primary care medicine and GI as well as ophthalmology.  He has seen biannually by dermatology due to a history of melanoma   Wt Readings from Last 3 Encounters:  06/21/16 207 lb  (93.9 kg)  02/20/16 212 lb (96.2 kg)  01/22/16 210 lb 1.6 oz (95.3 kg)     Alcohol-Tobacco  Smoking Status: never   Allergies:  1) Sulfamethoxazole (Sulfamethoxazole)   Past History:  Past Medical History:   High Cholesterol  Allergies  Colonic polyps, hx of  Hypertension  Benign prostatic hypertrophy  suspected gout  impaired glucose tolerance  Melanoma 2016  Past Surgical History:    right inguinal hernia repair in 1990  colonoscopy March 2008  Resection, melanoma, left facial area 2016  Family History:    Family History Diabetes 1st degree relative  Family History Hypertension  Family History of Cardiovascular disorder  father died age 54 of MI  Mother died age 37 complications of senile dementia  One brother, status post CABG h/o DM2 one sister, status post CABG  one sister deceased from melanoma   Social History:   Occupation: Press photographer Rep  Married  discontinued tobacco, approximately 30 years ago  Retired  Smoking Status: never     Review of Systems  Constitutional: Negative for activity change, appetite change, chills, fatigue and fever.  HENT: Negative for congestion, dental problem, ear pain, hearing loss, mouth sores, rhinorrhea, sinus pressure, sneezing, tinnitus, trouble swallowing and voice change.   Eyes: Negative for photophobia, pain, redness and visual disturbance.  Respiratory: Negative for apnea, cough, choking, chest tightness, shortness of breath and wheezing.   Cardiovascular: Negative for chest pain, palpitations and leg swelling.  Gastrointestinal: Negative for abdominal distention, abdominal pain, anal bleeding, blood in stool, constipation, diarrhea, nausea, rectal pain and vomiting.  Genitourinary:  Negative for decreased urine volume, difficulty urinating, discharge, dysuria, flank pain, frequency, genital sores, hematuria, penile swelling, scrotal swelling, testicular pain and urgency.  Musculoskeletal: Negative for arthralgias,  back pain, gait problem, joint swelling, myalgias, neck pain and neck stiffness.  Skin: Negative for color change, rash and wound.  Neurological: Negative for dizziness, tremors, seizures, syncope, facial asymmetry, speech difficulty, weakness, light-headedness, numbness and headaches.  Hematological: Negative for adenopathy. Does not bruise/bleed easily.  Psychiatric/Behavioral: Negative for agitation, behavioral problems, confusion, decreased concentration, dysphoric mood, hallucinations, self-injury, sleep disturbance and suicidal ideas. The patient is not nervous/anxious.        Objective:   Physical Exam  Constitutional: He appears well-developed and well-nourished.  HENT:  Head: Normocephalic and atraumatic.  Right Ear: External ear normal.  Left Ear: External ear normal.  Nose: Nose normal.  Mouth/Throat: Oropharynx is clear and moist.  Eyes: Conjunctivae and EOM are normal. Pupils are equal, round, and reactive to light. No scleral icterus.  Neck: Normal range of motion. Neck supple. No JVD present. No thyromegaly present.  Cardiovascular: Regular rhythm, normal heart sounds and intact distal pulses.  Exam reveals no gallop and no friction rub.   No murmur heard. Slight decreased right dorsalis pedis pulse  Pulmonary/Chest: Effort normal and breath sounds normal. He exhibits no tenderness.  Abdominal: Soft. Bowel sounds are normal. He exhibits no distension and no mass. There is no tenderness.  Genitourinary: Penis normal. Rectal exam shows guaiac negative stool.  Genitourinary Comments: Prostate +2 enlarged  Musculoskeletal: Normal range of motion. He exhibits no edema or tenderness.  Lymphadenopathy:    He has no cervical adenopathy.  Neurological: He is alert. He has normal reflexes. No cranial nerve deficit. Coordination normal.  Skin: Skin is warm and dry. No rash noted.  Left temporal area unremarkable  Approximate 4 cm lipoma noted involving the left mid back area   Psychiatric: He has a normal mood and affect. His behavior is normal.          Assessment & Plan:   Preventive health examination Hypertension well controlled Dyslipidemia well controlled ED. Well controlled on Levitra Colonic polyps.  Hyperplastic polyps.  10 year interval.  Recommended.  Followup colonoscopy 2018 BPH reasonable control on alpha blocker therapy Impaired glucose tolerance History of malignant melanoma, left temporal area.  Will continue biannual dermatology follow-up  Laboratory studies reviewed Continue home blood pressure monitoring  Recheck one year or as needed  Nyoka Cowden, MD

## 2016-06-21 NOTE — Patient Instructions (Signed)
Limit your sodium (Salt) intake  Please check your blood pressure on a regular basis.  If it is consistently greater than 150/90, please make an office appointment.    It is important that you exercise regularly, at least 20 minutes 3 to 4 times per week.  If you develop chest pain or shortness of breath seek  medical attention.  Return in one year for follow-up  Dermatology follow-up as scheduled

## 2016-06-21 NOTE — Progress Notes (Signed)
Pre visit review using our clinic review tool, if applicable. No additional management support is needed unless otherwise documented below in the visit note. 

## 2016-06-22 LAB — HEPATITIS C ANTIBODY: HCV Ab: NEGATIVE

## 2016-07-03 ENCOUNTER — Other Ambulatory Visit: Payer: Self-pay | Admitting: Internal Medicine

## 2016-07-13 ENCOUNTER — Other Ambulatory Visit: Payer: Self-pay | Admitting: Internal Medicine

## 2016-09-06 DIAGNOSIS — Z85820 Personal history of malignant melanoma of skin: Secondary | ICD-10-CM | POA: Diagnosis not present

## 2016-09-06 DIAGNOSIS — D225 Melanocytic nevi of trunk: Secondary | ICD-10-CM | POA: Diagnosis not present

## 2016-09-06 DIAGNOSIS — D1801 Hemangioma of skin and subcutaneous tissue: Secondary | ICD-10-CM | POA: Diagnosis not present

## 2016-09-06 DIAGNOSIS — L821 Other seborrheic keratosis: Secondary | ICD-10-CM | POA: Diagnosis not present

## 2017-02-08 ENCOUNTER — Ambulatory Visit (INDEPENDENT_AMBULATORY_CARE_PROVIDER_SITE_OTHER): Payer: Medicare HMO | Admitting: Adult Health

## 2017-02-08 ENCOUNTER — Encounter: Payer: Self-pay | Admitting: Adult Health

## 2017-02-08 VITALS — BP 132/74 | Temp 97.7°F | Wt 205.8 lb

## 2017-02-08 DIAGNOSIS — J209 Acute bronchitis, unspecified: Secondary | ICD-10-CM

## 2017-02-08 MED ORDER — PREDNISONE 20 MG PO TABS: 20.0000 mg | ORAL_TABLET | Freq: Every day | ORAL | 0 refills | Status: DC

## 2017-02-08 MED ORDER — BENZONATATE 200 MG PO CAPS: 200.0000 mg | ORAL_CAPSULE | Freq: Two times a day (BID) | ORAL | 0 refills | Status: DC | PRN

## 2017-02-08 NOTE — Progress Notes (Signed)
Subjective:    Patient ID: Vincent Stevens, male    DOB: 1946-08-03, 71 y.o.   MRN: [id number]  HPI  71 year old male who presents to the office today for the acute complaint of non productive cough x 2 weeks. He reports " this happens to me every year and I have to come see Dr. Raliegh Ip." He has been using OTC cough medications which is not helping.   He denies any fevers, sinus pain or pressure, or feeling ill.    Review of Systems  Constitutional: Positive for fatigue. Negative for activity change, chills and fever.  Respiratory: Positive for cough and chest tightness. Negative for wheezing.   Cardiovascular: Negative.   Genitourinary: Negative.   Neurological: Negative.   All other systems reviewed and are negative.  Past Medical History:  Diagnosis Date  . Allergy   . Benign prostatic hypertrophy   . Gout   . Hyperlipidemia   . Hypertension     Social History   Social History  . Marital status: Single    Spouse name: N/A  . Number of children: N/A  . Years of education: N/A   Occupational History  . Not on file.   Social History Main Topics  . Smoking status: Former Smoker    Quit date: 11/29/1980  . Smokeless tobacco: Never Used  . Alcohol use No  . Drug use: No  . Sexual activity: Not on file   Other Topics Concern  . Not on file   Social History Narrative  . No narrative on file    Past Surgical History:  Procedure Laterality Date  . HERNIA REPAIR      Family History  Problem Relation Age of Onset  . Diabetes    . Hypertension    . Heart attack Father   . Dementia Mother   . Cancer Sister     Allergies  Allergen Reactions  . Sulfamethoxazole     REACTION: unspecified    Current Outpatient Prescriptions on File Prior to Visit  Medication Sig Dispense Refill  . amLODipine (NORVASC) 5 MG tablet TAKE 1 TABLET EVERY DAY 90 tablet 3  . fluticasone (FLONASE) 50 MCG/ACT nasal spray Place 2 sprays into both nostrils daily. 16 g 1  . ibuprofen  (ADVIL,MOTRIN) 200 MG tablet Take 400 mg by mouth every 6 (six) hours as needed.    . Multiple Vitamin (MULTIVITAMIN) tablet Take 1 tablet by mouth daily.    . simvastatin (ZOCOR) 20 MG tablet TAKE 1 TABLET AT BEDTIME 90 tablet 3  . tamsulosin (FLOMAX) 0.4 MG CAPS capsule TAKE 1 CAPSULE EVERY DAY 90 capsule 3  . vardenafil (LEVITRA) 20 MG tablet Take 1 tablet (20 mg total) by mouth daily as needed for erectile dysfunction. 10 tablet 2   No current facility-administered medications on file prior to visit.     BP 132/74 (BP Location: Left Arm, Patient Position: Sitting, Cuff Size: Normal)   Temp 97.7 F (36.5 C) (Oral)   Wt 205 lb 12.8 oz (93.4 kg)   BMI 31.52 kg/m       Objective:   Physical Exam  Constitutional: He is oriented to person, place, and time. He appears well-developed and well-nourished. No distress.  Neck: Normal range of motion. Neck supple.  Cardiovascular: Normal rate, regular rhythm, normal heart sounds and intact distal pulses.  Exam reveals no gallop and no friction rub.   No murmur heard. Pulmonary/Chest: Effort normal and breath sounds normal. No respiratory distress. He  has no wheezes. He has no rales. He exhibits no tenderness.  Lymphadenopathy:    He has no cervical adenopathy.  Neurological: He is alert and oriented to person, place, and time.  Skin: Skin is warm and dry. No rash noted. He is not diaphoretic. No erythema. No pallor.  Psychiatric: He has a normal mood and affect. His behavior is normal. Judgment and thought content normal.  Nursing note and vitals reviewed.     Assessment & Plan:  1. Acute bronchitis, unspecified organism - No concern for infectious process. No need for antibiotic treatment at this time  - benzonatate (TESSALON) 200 MG capsule; Take 1 capsule (200 mg total) by mouth 2 (two) times daily as needed for cough.  Dispense: 20 capsule; Refill: 0 - predniSONE (DELTASONE) 20 MG tablet; Take 1 tablet (20 mg total) by mouth daily  with breakfast.  Dispense: 7 tablet; Refill: 0  Dorothyann Peng, NP

## 2017-03-07 DIAGNOSIS — D235 Other benign neoplasm of skin of trunk: Secondary | ICD-10-CM | POA: Diagnosis not present

## 2017-03-07 DIAGNOSIS — L821 Other seborrheic keratosis: Secondary | ICD-10-CM | POA: Diagnosis not present

## 2017-03-07 DIAGNOSIS — Z85820 Personal history of malignant melanoma of skin: Secondary | ICD-10-CM | POA: Diagnosis not present

## 2017-03-07 DIAGNOSIS — D1801 Hemangioma of skin and subcutaneous tissue: Secondary | ICD-10-CM | POA: Diagnosis not present

## 2017-06-06 DIAGNOSIS — K5730 Diverticulosis of large intestine without perforation or abscess without bleeding: Secondary | ICD-10-CM | POA: Diagnosis not present

## 2017-06-06 DIAGNOSIS — D126 Benign neoplasm of colon, unspecified: Secondary | ICD-10-CM | POA: Diagnosis not present

## 2017-06-06 DIAGNOSIS — Z1211 Encounter for screening for malignant neoplasm of colon: Secondary | ICD-10-CM | POA: Diagnosis not present

## 2017-06-07 DIAGNOSIS — D126 Benign neoplasm of colon, unspecified: Secondary | ICD-10-CM | POA: Diagnosis not present

## 2017-06-24 ENCOUNTER — Encounter: Payer: Self-pay | Admitting: Internal Medicine

## 2017-06-24 ENCOUNTER — Ambulatory Visit (INDEPENDENT_AMBULATORY_CARE_PROVIDER_SITE_OTHER): Payer: Medicare HMO | Admitting: Internal Medicine

## 2017-06-24 VITALS — BP 122/70 | HR 54 | Temp 97.9°F | Ht 68.0 in | Wt 205.8 lb

## 2017-06-24 DIAGNOSIS — J209 Acute bronchitis, unspecified: Secondary | ICD-10-CM

## 2017-06-24 DIAGNOSIS — R7302 Impaired glucose tolerance (oral): Secondary | ICD-10-CM | POA: Diagnosis not present

## 2017-06-24 DIAGNOSIS — E291 Testicular hypofunction: Secondary | ICD-10-CM

## 2017-06-24 DIAGNOSIS — E785 Hyperlipidemia, unspecified: Secondary | ICD-10-CM

## 2017-06-24 DIAGNOSIS — Z0000 Encounter for general adult medical examination without abnormal findings: Secondary | ICD-10-CM | POA: Diagnosis not present

## 2017-06-24 DIAGNOSIS — Z86010 Personal history of colonic polyps: Secondary | ICD-10-CM | POA: Diagnosis not present

## 2017-06-24 DIAGNOSIS — I10 Essential (primary) hypertension: Secondary | ICD-10-CM

## 2017-06-24 LAB — CBC WITH DIFFERENTIAL/PLATELET
BASOS PCT: 0.8 % (ref 0.0–3.0)
Basophils Absolute: 0 10*3/uL (ref 0.0–0.1)
EOS PCT: 2.6 % (ref 0.0–5.0)
Eosinophils Absolute: 0.2 10*3/uL (ref 0.0–0.7)
HEMATOCRIT: 49 % (ref 39.0–52.0)
Hemoglobin: 16.1 g/dL (ref 13.0–17.0)
LYMPHS ABS: 1.6 10*3/uL (ref 0.7–4.0)
Lymphocytes Relative: 26.5 % (ref 12.0–46.0)
MCHC: 32.8 g/dL (ref 30.0–36.0)
MCV: 88.9 fl (ref 78.0–100.0)
MONO ABS: 0.5 10*3/uL (ref 0.1–1.0)
Monocytes Relative: 8 % (ref 3.0–12.0)
NEUTROS ABS: 3.8 10*3/uL (ref 1.4–7.7)
Neutrophils Relative %: 62.1 % (ref 43.0–77.0)
PLATELETS: 211 10*3/uL (ref 150.0–400.0)
RBC: 5.51 Mil/uL (ref 4.22–5.81)
RDW: 13.9 % (ref 11.5–15.5)
WBC: 6.2 10*3/uL (ref 4.0–10.5)

## 2017-06-24 LAB — LIPID PANEL
CHOL/HDL RATIO: 3
CHOLESTEROL: 170 mg/dL (ref 0–200)
HDL: 49 mg/dL (ref >39.00)
LDL CALC: 100 mg/dL — AB (ref 0–99)
NONHDL: 120.71
Triglycerides: 104 mg/dL (ref 0.0–149.0)
VLDL: 20.8 mg/dL (ref 0.0–40.0)

## 2017-06-24 LAB — TSH: TSH: 2.71 u[IU]/mL (ref 0.35–4.50)

## 2017-06-24 LAB — COMPREHENSIVE METABOLIC PANEL
ALBUMIN: 4.9 g/dL (ref 3.5–5.2)
ALT: 16 U/L (ref 0–53)
AST: 22 U/L (ref 0–37)
Alkaline Phosphatase: 62 U/L (ref 39–117)
BUN: 12 mg/dL (ref 6–23)
CHLORIDE: 103 meq/L (ref 96–112)
CO2: 30 meq/L (ref 19–32)
CREATININE: 0.99 mg/dL (ref 0.40–1.50)
Calcium: 9.8 mg/dL (ref 8.4–10.5)
GFR: 78.89 mL/min (ref >60.00)
GLUCOSE: 98 mg/dL (ref 70–99)
Potassium: 4.6 mEq/L (ref 3.5–5.1)
SODIUM: 140 meq/L (ref 135–145)
Total Bilirubin: 0.9 mg/dL (ref 0.2–1.2)
Total Protein: 7.3 g/dL (ref 6.0–8.3)

## 2017-06-24 LAB — HEMOGLOBIN A1C: Hgb A1c MFr Bld: 6.2 % (ref 4.6–6.5)

## 2017-06-24 LAB — TESTOSTERONE: TESTOSTERONE: 375.35 ng/dL (ref 300.00–890.00)

## 2017-06-24 MED ORDER — IBUPROFEN 200 MG PO TABS: 400.0000 mg | ORAL_TABLET | Freq: Four times a day (QID) | ORAL | 1 refills | Status: DC | PRN

## 2017-06-24 MED ORDER — AMLODIPINE BESYLATE 5 MG PO TABS: 5.0000 mg | ORAL_TABLET | Freq: Every day | ORAL | 3 refills | Status: DC

## 2017-06-24 MED ORDER — ONE-DAILY MULTI VITAMINS PO TABS: 1.0000 | ORAL_TABLET | Freq: Every day | ORAL | 3 refills | Status: DC

## 2017-06-24 MED ORDER — VARDENAFIL HCL 20 MG PO TABS: 20.0000 mg | ORAL_TABLET | Freq: Every day | ORAL | 3 refills | Status: DC | PRN

## 2017-06-24 MED ORDER — BENZONATATE 200 MG PO CAPS: 200.0000 mg | ORAL_CAPSULE | Freq: Two times a day (BID) | ORAL | 2 refills | Status: DC | PRN

## 2017-06-24 MED ORDER — PREDNISONE 20 MG PO TABS: 20.0000 mg | ORAL_TABLET | Freq: Every day | ORAL | 0 refills | Status: DC

## 2017-06-24 MED ORDER — SIMVASTATIN 20 MG PO TABS: 20.0000 mg | ORAL_TABLET | Freq: Every day | ORAL | 3 refills | Status: DC

## 2017-06-24 MED ORDER — TAMSULOSIN HCL 0.4 MG PO CAPS: 0.4000 mg | ORAL_CAPSULE | Freq: Every day | ORAL | 3 refills | Status: DC

## 2017-06-24 NOTE — Progress Notes (Signed)
Subjective:    Patient ID: Vincent Stevens, male    DOB: [birth year], 71 y.o.   MRN: [id number]  HPI  71 year old patient who is seen today for a preventive health examination as well as annual Medicare wellness visit He continues to do quite well.  He does have a history of essential hypertension, impaired glucose tolerance and also dyslipidemia. He has been treated for ED and also has symptomatic BPH . No new concerns or complaints He had a recent follow-up colonoscopy.  Apparently he did have a polyp and a 5 year interval recommended  Past Medical History:  Diagnosis Date  . Allergy   . Benign prostatic hypertrophy   . Gout   . Hyperlipidemia   . Hypertension      Social History   Social History  . Marital status: Single    Spouse name: N/A  . Number of children: N/A  . Years of education: N/A   Occupational History  . Not on file.   Social History Main Topics  . Smoking status: Former Smoker    Quit date: 11/29/1980  . Smokeless tobacco: Never Used  . Alcohol use No  . Drug use: No  . Sexual activity: Not on file   Other Topics Concern  . Not on file   Social History Narrative  . No narrative on file    Past Surgical History:  Procedure Laterality Date  . HERNIA REPAIR      Family History  Problem Relation Age of Onset  . Diabetes Unknown   . Hypertension Unknown   . Heart attack Father   . Dementia Mother   . Cancer Sister     Allergies  Allergen Reactions  . Sulfamethoxazole     REACTION: unspecified    No current outpatient prescriptions on file prior to visit.   No current facility-administered medications on file prior to visit.     BP 122/70 (BP Location: Left Arm, Patient Position: Sitting, Cuff Size: Normal)   Pulse (!) 54   Temp 97.9 F (36.6 C) (Oral)   Ht 5\' 8"  (1.727 m)   Wt 205 lb 12.8 oz (93.4 kg)   SpO2 98%   BMI 31.29 kg/m   Medicare wellness visit  1. Risk factors, based on past  M,S,F history.  Cardiac vascular  risk factors include hypertension, dyslipidemia and a history of mild impaired glucose tolerance  2.  Physical activities:remains quite active.  Still works.  No exercise limitations  3.  Depression/mood:no history of major depression or mood disorder  4.  Hearing:no deficits  5.  ADL's:independent  6.  Fall risk:low  7.  Home safety:no problems identified  8.  Height weight, and visual acuity;height and weight stable no change in visual acuity  9.  Counseling:continue heart healthy diet and regular exercise.  10. Lab orders based on risk factors:laboratory update will be reviewed.  Will also check a testosterone level in view of his history of ED  11. Referral :not appropriate at this time  12. Care plan:continue efforts at aggressive risk factor modification  13. Cognitive assessment: alert in order with normal affect.  No cognitive dysfunction  14. Screening: Patient provided with a written and personalized 5-10 year screening schedule in the AVS.    15. Provider List Update: GI primary care ophthalmology    Review of Systems  Constitutional: Negative for appetite change, chills, fatigue and fever.  HENT: Negative for congestion, dental problem, ear pain, hearing loss, sore throat, tinnitus, trouble  swallowing and voice change.   Eyes: Negative for pain, discharge and visual disturbance.  Respiratory: Negative for cough, chest tightness, wheezing and stridor.   Cardiovascular: Negative for chest pain, palpitations and leg swelling.  Gastrointestinal: Negative for abdominal distention, abdominal pain, blood in stool, constipation, diarrhea, nausea and vomiting.  Genitourinary: Positive for decreased urine volume, difficulty urinating and urgency. Negative for discharge, flank pain, genital sores and hematuria.  Musculoskeletal: Positive for back pain. Negative for arthralgias, gait problem, joint swelling, myalgias and neck stiffness.  Skin: Negative for rash.    Neurological: Negative for dizziness, syncope, speech difficulty, weakness, numbness and headaches.  Hematological: Negative for adenopathy. Does not bruise/bleed easily.  Psychiatric/Behavioral: Negative for behavioral problems and dysphoric mood. The patient is not nervous/anxious.        Objective:   Physical Exam  Constitutional: He appears well-developed and well-nourished.  Blood pressure low normal  HENT:  Head: Normocephalic and atraumatic.  Right Ear: External ear normal.  Left Ear: External ear normal.  Nose: Nose normal.  Mouth/Throat: Oropharynx is clear and moist.  Eyes: Pupils are equal, round, and reactive to light. Conjunctivae and EOM are normal. No scleral icterus.  Neck: Normal range of motion. Neck supple. No JVD present. No thyromegaly present.  Cardiovascular: Regular rhythm, normal heart sounds and intact distal pulses.  Exam reveals no gallop and no friction rub.   No murmur heard. Pulmonary/Chest: Effort normal and breath sounds normal. He exhibits no tenderness.  Abdominal: Soft. Bowel sounds are normal. He exhibits no distension and no mass. There is no tenderness.  Genitourinary: Penis normal.  Genitourinary Comments: Prostate plus 2  Musculoskeletal: Normal range of motion. He exhibits no edema or tenderness.  Lymphadenopathy:    He has no cervical adenopathy.  Neurological: He is alert. He has normal reflexes. No cranial nerve deficit. Coordination normal.  Skin: Skin is warm and dry. No rash noted.  Psychiatric: He has a normal mood and affect. His behavior is normal.          Assessment & Plan:  Preventive health examination Medicare wellness visit Essential hypertension.  Excellent control.  No change in therapy Symptomatic BPH.  Continue Flomax.  Urology referral will be considered ED.  Will check a testosterone level Dyslipidemia.  Continue statin therapy.  We'll review a lipid profile  Return in one year for follow-up or as  needed  [employer]

## 2017-06-24 NOTE — Patient Instructions (Signed)
Limit your sodium (Salt) intake  Please check your blood pressure on a regular basis.  If it is consistently greater than 150/90, please make an office appointment.    It is important that you exercise regularly, at least 20 minutes 3 to 4 times per week.  If you develop chest pain or shortness of breath seek  medical attention.  Return in one year for follow-up  

## 2017-07-21 ENCOUNTER — Telehealth: Payer: Self-pay | Admitting: Internal Medicine

## 2017-07-21 NOTE — Telephone Encounter (Signed)
Pt had labs done 7/27 and has not gotten any results. Pt would like a call back, thanks!

## 2017-07-21 NOTE — Telephone Encounter (Signed)
Please advise 

## 2017-07-22 NOTE — Telephone Encounter (Signed)
Please call/notify patient that lab/test/procedure is normal Testosterone level normal Cholesterol 170

## 2017-07-22 NOTE — Telephone Encounter (Signed)
Spoke with patients wife and informed her that pt's lab work was normal. Pt verbalized understanding.

## 2017-08-25 DIAGNOSIS — D225 Melanocytic nevi of trunk: Secondary | ICD-10-CM | POA: Diagnosis not present

## 2017-08-25 DIAGNOSIS — L821 Other seborrheic keratosis: Secondary | ICD-10-CM | POA: Diagnosis not present

## 2017-08-25 DIAGNOSIS — D1801 Hemangioma of skin and subcutaneous tissue: Secondary | ICD-10-CM | POA: Diagnosis not present

## 2017-08-25 DIAGNOSIS — L814 Other melanin hyperpigmentation: Secondary | ICD-10-CM | POA: Diagnosis not present

## 2017-08-25 DIAGNOSIS — Z85820 Personal history of malignant melanoma of skin: Secondary | ICD-10-CM | POA: Diagnosis not present

## 2018-01-05 ENCOUNTER — Ambulatory Visit (INDEPENDENT_AMBULATORY_CARE_PROVIDER_SITE_OTHER): Payer: Medicare HMO | Admitting: Family Medicine

## 2018-01-05 ENCOUNTER — Encounter: Payer: Self-pay | Admitting: Family Medicine

## 2018-01-05 VITALS — BP 140/88 | HR 55 | Temp 97.8°F | Wt 205.6 lb

## 2018-01-05 DIAGNOSIS — J209 Acute bronchitis, unspecified: Secondary | ICD-10-CM | POA: Diagnosis not present

## 2018-01-05 MED ORDER — BENZONATATE 200 MG PO CAPS: 200.0000 mg | ORAL_CAPSULE | Freq: Two times a day (BID) | ORAL | 2 refills | Status: DC | PRN

## 2018-01-05 MED ORDER — METHYLPREDNISOLONE 4 MG PO TBPK: ORAL_TABLET | ORAL | 0 refills | Status: DC

## 2018-01-05 NOTE — Progress Notes (Signed)
   Subjective:    Patient ID: Vincent Stevens, male    DOB: March 17, 1946, 72 y.o.   MRN: [id number]  HPI Here for 6 weeks of coughing. At first the cough produced clear sputum and he had a ST. No  He just has a dry cough. No fever. He tends to get this once every winter. He had an unremarkable CXR in February 2018.    Review of Systems  Constitutional: Negative.   HENT: Negative.   Eyes: Negative.   Respiratory: Positive for cough. Negative for chest tightness, shortness of breath and wheezing.   Cardiovascular: Negative.        Objective:   Physical Exam  Constitutional: He appears well-developed and well-nourished. No distress.  HENT:  Right Ear: External ear normal.  Left Ear: External ear normal.  Nose: Nose normal.  Mouth/Throat: Oropharynx is clear and moist.  Eyes: Conjunctivae are normal.  Neck: No thyromegaly present.  Cardiovascular: Normal rate, regular rhythm, normal heart sounds and intact distal pulses.  Pulmonary/Chest: Effort normal and breath sounds normal. No respiratory distress. He has no wheezes. He has no rales.  Musculoskeletal: He exhibits no edema.  Lymphadenopathy:    He has no cervical adenopathy.          Assessment & Plan:  Chronic cough after a probable viral URI. He will drink fluids and take Benzonatate as needed. Given a steroid taper to quiet irritated airways. Recheck prn.  Alysia Penna, MD

## 2018-02-16 ENCOUNTER — Ambulatory Visit: Payer: Self-pay | Admitting: *Deleted

## 2018-02-16 ENCOUNTER — Ambulatory Visit: Payer: Medicare HMO | Admitting: Family Medicine

## 2018-02-16 NOTE — Telephone Encounter (Signed)
I returned his call.   He stated,  " I'm doing this to please my wife."   "She thinks I'm having a heart attack".  He has been sick with a respiratory illness since about Christmas time.   I saw Dr. Sarajane Jews about 3 weeks ago and he said I had bronchitis.  He prescribed me some medicine for the cough and bronchitis.   My cough is almost gone now and I'm feeling much better.  My main concern is I get short of breath with exertion only.  When I walk to the mailbox or try to trim the bushes I get short of breath.   "I think it's an after effect form the bronchitis".   "I'm not having a heart pain".    I just feel a "cold" sensation when I exert myself in my chest.     He denies any other s/s of cardiac issues when I asked him specific symptoms.  He already has an appt with Dr. Sarajane Jews for 02/23/18.   I instructed him to go to the ED or call us back if he started experiencing any of the symptoms I went over with him.   He verbalized understanding and agreed to this plan.  Reason for Disposition . [1] MILD longstanding difficulty breathing AND [2]  SAME as normal  Answer Assessment - Initial Assessment Questions 1. RESPIRATORY STATUS: "Describe your breathing?" (e.g., wheezing, shortness of breath, unable to speak, severe coughing)      I get short of breath when I try to exert myself.   I had bronchitis 3 weeks and my coughing is much better.  I was taking OTC medications. 2. ONSET: "When did this breathing problem begin?"      When I breath cold air it seems worse.   I thought it was part of the bronchitis.    I got sick around Christmas time.   3. PATTERN "Does the difficult breathing come and go, or has it been constant since it started?"      I only feel short of breath when I exert my self like going to the mailbox or trim bushes in the yard.   Today I did not feel so bad when I went to the mailbox. 4. SEVERITY: "How bad is your breathing?" (e.g., mild, moderate, severe)    - MILD: No SOB at rest, mild  SOB with walking, speaks normally in sentences, can lay down, no retractions, pulse < 100.    - MODERATE: SOB at rest, SOB with minimal exertion and prefers to sit, cannot lie down flat, speaks in phrases, mild retractions, audible wheezing, pulse 100-120.    - SEVERE: Very SOB at rest, speaks in single words, struggling to breathe, sitting hunched forward, retractions, pulse > 120      I'm doing this to please my wife.   She thinks I'm having a heart attack. 5. RECURRENT SYMPTOM: "Have you had difficulty breathing before?" If so, ask: "When was the last time?" and "What happened that time?"      No lung history 6. CARDIAC HISTORY: "Do you have any history of heart disease?" (e.g., heart attack, angina, bypass surgery, angioplasty)      No 7. LUNG HISTORY: "Do you have any history of lung disease?"  (e.g., pulmonary embolus, asthma, emphysema)     No 8. CAUSE: "What do you think is causing the breathing problem?"      I'm doing this to please my wife. 9. OTHER SYMPTOMS: "Do you  have any other symptoms? (e.g., dizziness, runny nose, cough, chest pain, fever)     None of the above.     The pain in my chest feels like cold.   Like eating ice cream real fast.   Just when I exert myself. 10. PREGNANCY: "Is there any chance you are pregnant?" "When was your last menstrual period?"       N/A 11. TRAVEL: "Have you traveled out of the country in the last month?" (e.g., travel history, exposures)       No  Protocols used: BREATHING DIFFICULTY-A-AH

## 2018-02-22 DIAGNOSIS — L814 Other melanin hyperpigmentation: Secondary | ICD-10-CM | POA: Diagnosis not present

## 2018-02-22 DIAGNOSIS — L821 Other seborrheic keratosis: Secondary | ICD-10-CM | POA: Diagnosis not present

## 2018-02-22 DIAGNOSIS — L570 Actinic keratosis: Secondary | ICD-10-CM | POA: Diagnosis not present

## 2018-02-22 DIAGNOSIS — Z85820 Personal history of malignant melanoma of skin: Secondary | ICD-10-CM | POA: Diagnosis not present

## 2018-02-22 DIAGNOSIS — D229 Melanocytic nevi, unspecified: Secondary | ICD-10-CM | POA: Diagnosis not present

## 2018-02-22 DIAGNOSIS — D1801 Hemangioma of skin and subcutaneous tissue: Secondary | ICD-10-CM | POA: Diagnosis not present

## 2018-02-23 ENCOUNTER — Ambulatory Visit (INDEPENDENT_AMBULATORY_CARE_PROVIDER_SITE_OTHER): Payer: Medicare HMO | Admitting: Family Medicine

## 2018-02-23 ENCOUNTER — Encounter: Payer: Self-pay | Admitting: Family Medicine

## 2018-02-23 VITALS — BP 124/72 | HR 56 | Temp 97.7°F | Ht 68.0 in | Wt 185.2 lb

## 2018-02-23 DIAGNOSIS — R0602 Shortness of breath: Secondary | ICD-10-CM

## 2018-02-23 DIAGNOSIS — R079 Chest pain, unspecified: Secondary | ICD-10-CM | POA: Insufficient documentation

## 2018-02-23 HISTORY — DX: Chest pain, unspecified: R07.9

## 2018-02-23 HISTORY — DX: Shortness of breath: R06.02

## 2018-02-23 MED ORDER — ISOSORBIDE MONONITRATE ER 30 MG PO TB24
30.0000 mg | ORAL_TABLET | Freq: Every day | ORAL | 6 refills | Status: DC
Start: 2018-02-23 — End: 2018-02-24

## 2018-02-23 NOTE — H&P (View-Only) (Signed)
Cardiology Office Note:    Date:  02/24/2018   ID:  MALEEK CRAVER, DOB 01/17/1946, MRN [id number]  PCP:  Marletta Lor, MD  Cardiologist:  Shirlee More, MD   Referring MD: Laurey Morale, MD  ASSESSMENT:    1. Chest pain, unspecified type   2. SOB (shortness of breath)   3. Essential hypertension    PLAN:    In order of problems listed above:  1. He has typical angina which is lifestyle disruptive and is preventing him from working and doing daily activities.  He is taking Levitra so I stopped the oral nitrate that was prescribed to him and bradycardia is present precluding the use of beta-blockers.  After discussion invasive or noninvasive he will undergo coronary angiography and percutaneous intervention if he has flow-limiting stenosis.  Options benefits and risks detailed with the patient he has no dye allergy or intolerance to dual antiplatelet therapy. 2. Anginal equivalent to undergo coronary angiography  3. hypertension stable blood pressure target continue current treatment stable continue statin 4.   Next appointment 2 weeks   Medication Adjustments/Labs and Tests Ordered: Current medicines are reviewed at length with the patient today.  Concerns regarding medicines are outlined above.  Orders Placed This Encounter  Procedures  . DG Chest 2 View  . Protime-INR  . CBC  . Basic metabolic panel   No orders of the defined types were placed in this encounter.    Chief Complaint  Patient presents with  . New Patient (Initial Visit)    per Dr Sarajane Jews to evaluate [facility]   . Shortness of Breath  . Chest Pain  4 weeks  History of Present Illness:    KMARION RAWL is a 72 y.o. male who is being seen today for the evaluation of exertional SOB and chest discomfort at the request of Laurey Morale, MD. Since December when he had bronchitis he has been having trouble with typical angina.  With physical activity using his upper extremities walking uphill or at work  he develops tightness in the chest radiates into both shoulders with shortness of breath that causes him to have to stop and rest.  The symptoms are interfering with his life and occur several times per week and just now about every other day.  So far he has not had nocturnal or rest episodes.  His cardiovascular risk factors are noteworthy for dyslipidemia and hypertension.  Past Medical History:  Diagnosis Date  . Allergy   . Benign prostatic hypertrophy   . BENIGN PROSTATIC HYPERTROPHY 09/19/2009   Qualifier: Diagnosis of  By: Burnice Logan  MD, Doretha Sou   . Chest pain 02/23/2018  . Dyslipidemia 08/14/2010   Qualifier: Diagnosis of  By: Burnice Logan  MD, Doretha Sou   . Essential hypertension 01/17/2009   Qualifier: Diagnosis of  By: Burnice Logan  MD, Doretha Sou   . Gout   . GOUT, ACUTE 11/07/2009   Qualifier: Diagnosis of  By: Burnice Logan  MD, Doretha Sou   . History of colonic polyps 09/27/2008   Qualifier: Diagnosis of  By: Burnice Logan  MD, Doretha Sou  Initial colonoscopy 2003.  Hyperplastic polyps only Followup colonoscopy 2008.  Normal  10 year interval recommended   . Hyperlipidemia   . Hypertension   . Impaired glucose tolerance 02/20/2013  . SOB (shortness of breath) 02/23/2018    Past Surgical History:  Procedure Laterality Date  . HERNIA REPAIR      Current Medications: Current Meds  Medication Sig  .  amLODipine (NORVASC) 5 MG tablet Take 1 tablet (5 mg total) by mouth daily.  Marland Kitchen aspirin EC 81 MG tablet Take 81 mg by mouth daily.  Marland Kitchen ibuprofen (ADVIL,MOTRIN) 200 MG tablet Take 2 tablets (400 mg total) by mouth every 6 (six) hours as needed. (Patient taking differently: Take 400 mg by mouth every 6 (six) hours as needed for headache or moderate pain. )  . Multiple Vitamin (MULTIVITAMIN) tablet Take 1 tablet by mouth daily.  . simvastatin (ZOCOR) 20 MG tablet Take 1 tablet (20 mg total) by mouth at bedtime.  . tamsulosin (FLOMAX) 0.4 MG CAPS capsule Take 1 capsule (0.4 mg total) by mouth daily.   . [DISCONTINUED] isosorbide mononitrate (IMDUR) 30 MG 24 hr tablet Take 1 tablet (30 mg total) by mouth daily.  . [DISCONTINUED] vardenafil (LEVITRA) 20 MG tablet Take 1 tablet (20 mg total) by mouth daily as needed for erectile dysfunction.     Allergies:   Sulfamethoxazole   Social History   Socioeconomic History  . Marital status: Single    Spouse name: Not on file  . Number of children: Not on file  . Years of education: Not on file  . Highest education level: Not on file  Occupational History  . Not on file  Social Needs  . Financial resource strain: Not on file  . Food insecurity:    Worry: Not on file    Inability: Not on file  . Transportation needs:    Medical: Not on file    Non-medical: Not on file  Tobacco Use  . Smoking status: Former Smoker    Last attempt to quit: 11/29/1980    Years since quitting: 37.2  . Smokeless tobacco: Never Used  Substance and Sexual Activity  . Alcohol use: No  . Drug use: No  . Sexual activity: Not on file  Lifestyle  . Physical activity:    Days per week: Not on file    Minutes per session: Not on file  . Stress: Not on file  Relationships  . Social connections:    Talks on phone: Not on file    Gets together: Not on file    Attends religious service: Not on file    Active member of club or organization: Not on file    Attends meetings of clubs or organizations: Not on file    Relationship status: Not on file  Other Topics Concern  . Not on file  Social History Narrative  . Not on file     Family History: The patient's family history includes Cancer in his sister; Dementia in his mother; Diabetes in his unknown relative; Heart attack in his father; Hypertension in his unknown relative.  ROS:   Review of Systems  Constitution: Negative.  HENT: Negative.   Eyes: Negative.   Cardiovascular: Positive for chest pain and dyspnea on exertion.  Respiratory: Positive for cough and shortness of breath.   Endocrine:  Negative.   Hematologic/Lymphatic: Negative.   Skin: Negative.   Musculoskeletal: Negative.   Gastrointestinal: Negative.   Genitourinary: Negative.   Neurological: Negative.   Psychiatric/Behavioral: Negative.   Allergic/Immunologic: Negative.    Please see the history of present illness.     All other systems reviewed and are negative.  EKGs/Labs/Other Studies Reviewed:    The following studies were reviewed today:   EKG:  EKG is  ordered today.  The ekg ordered today demonstrates sinus bradycardia otherwise normal  Recent Labs: 06/24/2017: ALT 16; BUN 12; Creatinine, Ser  0.99; Hemoglobin 16.1; Platelets 211.0; Potassium 4.6; Sodium 140; TSH 2.71  Recent Lipid Panel    Component Value Date/Time   CHOL 170 06/24/2017 1226   TRIG 104.0 06/24/2017 1226   HDL 49.00 06/24/2017 1226   CHOLHDL 3 06/24/2017 1226   VLDL 20.8 06/24/2017 1226   LDLCALC 100 (H) 06/24/2017 1226    Physical Exam:    VS:  BP 102/64 (BP Location: Left Arm, Patient Position: Sitting, Cuff Size: Normal)   Pulse (!) 57   Ht 5\' 8"  (1.727 m)   Wt 207 lb 1.9 oz (93.9 kg)   SpO2 99%   BMI 31.49 kg/m     Wt Readings from Last 3 Encounters:  02/24/18 207 lb 1.9 oz (93.9 kg)  02/23/18 185 lb 3.2 oz (84 kg)  01/05/18 205 lb 9.6 oz (93.3 kg)     GEN:  Well nourished, well developed in no acute distress HEENT: Normal NECK: No JVD; No carotid bruits LYMPHATICS: No lymphadenopathy CARDIAC: 1/6 SEM LLSB RRR, no murmurs, rubs, gallops RESPIRATORY:  Clear to auscultation without rales, wheezing or rhonchi  ABDOMEN: Soft, non-tender, non-distended MUSCULOSKELETAL:  No edema; No deformity  SKIN: Warm and dry NEUROLOGIC:  Alert and oriented x 3 PSYCHIATRIC:  Normal affect     Signed, Shirlee More, MD  02/24/2018 1:15 PM    [facility]

## 2018-02-23 NOTE — Progress Notes (Signed)
   Subjective:    Patient ID: Vincent Stevens, male    DOB: 03-28-1946, 72 y.o.   MRN: [id number]  HPI Here for 3 weeks of feeling chest pressure and SOB on exertion. This occurs with minimal exertion, such as when walking up a short hill behind his house. He describes a pressure sensation in the chest like "a block of ice sitting on my chest". These symptoms go away in 2-3 minutes when he stops to rest. No sweats or nausea. He was treated for a bronchitis here on 01-05-18 but this resolved quickly with antibiotics. His BP has been stable. He has never had a stress test. His last EKG in 2-15 was normal. He notes his father died of a MI at age 44. His mother, brother, and sister have all had CAD.    Review of Systems  Constitutional: Negative.   Respiratory: Positive for chest tightness and shortness of breath. Negative for cough and wheezing.   Cardiovascular: Positive for chest pain. Negative for palpitations and leg swelling.  Gastrointestinal: Negative.   Neurological: Negative.        Objective:   Physical Exam  Constitutional: He is oriented to person, place, and time. He appears well-developed and well-nourished. No distress.  Neck: No thyromegaly present.  Cardiovascular: Normal rate, regular rhythm, normal heart sounds and intact distal pulses.  No murmur heard. EKG today is normal   Pulmonary/Chest: Effort normal and breath sounds normal. No respiratory distress. He has no wheezes. He has no rales. He exhibits no tenderness.  Abdominal: Soft. Bowel sounds are normal. He exhibits no distension and no mass. There is no tenderness. There is no rebound and no guarding.  Lymphadenopathy:    He has no cervical adenopathy.  Neurological: He is alert and oriented to person, place, and time.          Assessment & Plan:  Recent onset of probable angina. I asked him to take it easy and not exert himself for the time being. Refer to Cardiology ASAP to evaluate. Start on Imdur 30 mg  daily.  Alysia Penna, MD

## 2018-02-23 NOTE — Progress Notes (Signed)
Cardiology Office Note:    Date:  02/24/2018   [name], DOB 02-20-1946, MRN [id number]  PCP:  Marletta Lor, MD  Cardiologist:  Shirlee More, MD   Referring MD: Laurey Morale, MD  ASSESSMENT:    1. Chest pain, unspecified type   2. SOB (shortness of breath)   3. Essential hypertension    PLAN:    In order of problems listed above:  1. He has typical angina which is lifestyle disruptive and is preventing him from working and doing daily activities.  He is taking Levitra so I stopped the oral nitrate that was prescribed to him and bradycardia is present precluding the use of beta-blockers.  After discussion invasive or noninvasive he will undergo coronary angiography and percutaneous intervention if he has flow-limiting stenosis.  Options benefits and risks detailed with the patient he has no dye allergy or intolerance to dual antiplatelet therapy. 2. Anginal equivalent to undergo coronary angiography  3. hypertension stable blood pressure target continue current treatment stable continue statin 4.   Next appointment 2 weeks   Medication Adjustments/Labs and Tests Ordered: Current medicines are reviewed at length with the patient today.  Concerns regarding medicines are outlined above.  Orders Placed This Encounter  Procedures  . DG Chest 2 View  . Protime-INR  . CBC  . Basic metabolic panel   No orders of the defined types were placed in this encounter.    Chief Complaint  Patient presents with  . New Patient (Initial Visit)    per Dr Sarajane Jews to evaluate [facility]   . Shortness of Breath  . Chest Pain  4 weeks  History of Present Illness:    Vincent Stevens is a 72 y.o. male who is being seen today for the evaluation of exertional SOB and chest discomfort at the request of Laurey Morale, MD. Since December when he had bronchitis he has been having trouble with typical angina.  With physical activity using his upper extremities walking uphill or at work  he develops tightness in the chest radiates into both shoulders with shortness of breath that causes him to have to stop and rest.  The symptoms are interfering with his life and occur several times per week and just now about every other day.  So far he has not had nocturnal or rest episodes.  His cardiovascular risk factors are noteworthy for dyslipidemia and hypertension.  Past Medical History:  Diagnosis Date  . Allergy   . Benign prostatic hypertrophy   . BENIGN PROSTATIC HYPERTROPHY 09/19/2009   Qualifier: Diagnosis of  By: Burnice Logan  MD, Doretha Sou   . Chest pain 02/23/2018  . Dyslipidemia 08/14/2010   Qualifier: Diagnosis of  By: Burnice Logan  MD, Doretha Sou   . Essential hypertension 01/17/2009   Qualifier: Diagnosis of  By: Burnice Logan  MD, Doretha Sou   . Gout   . GOUT, ACUTE 11/07/2009   Qualifier: Diagnosis of  By: Burnice Logan  MD, Doretha Sou   . History of colonic polyps 09/27/2008   Qualifier: Diagnosis of  By: Burnice Logan  MD, Doretha Sou  Initial colonoscopy 2003.  Hyperplastic polyps only Followup colonoscopy 2008.  Normal  10 year interval recommended   . Hyperlipidemia   . Hypertension   . Impaired glucose tolerance 02/20/2013  . SOB (shortness of breath) 02/23/2018    Past Surgical History:  Procedure Laterality Date  . HERNIA REPAIR      Current Medications: Current Meds  Medication Sig  .  amLODipine (NORVASC) 5 MG tablet Take 1 tablet (5 mg total) by mouth daily.  Marland Kitchen aspirin EC 81 MG tablet Take 81 mg by mouth daily.  Marland Kitchen ibuprofen (ADVIL,MOTRIN) 200 MG tablet Take 2 tablets (400 mg total) by mouth every 6 (six) hours as needed. (Patient taking differently: Take 400 mg by mouth every 6 (six) hours as needed for headache or moderate pain. )  . Multiple Vitamin (MULTIVITAMIN) tablet Take 1 tablet by mouth daily.  . simvastatin (ZOCOR) 20 MG tablet Take 1 tablet (20 mg total) by mouth at bedtime.  . tamsulosin (FLOMAX) 0.4 MG CAPS capsule Take 1 capsule (0.4 mg total) by mouth daily.   . [DISCONTINUED] isosorbide mononitrate (IMDUR) 30 MG 24 hr tablet Take 1 tablet (30 mg total) by mouth daily.  . [DISCONTINUED] vardenafil (LEVITRA) 20 MG tablet Take 1 tablet (20 mg total) by mouth daily as needed for erectile dysfunction.     Allergies:   Sulfamethoxazole   Social History   Socioeconomic History  . Marital status: Single    Spouse name: Not on file  . Number of children: Not on file  . Years of education: Not on file  . Highest education level: Not on file  Occupational History  . Not on file  Social Needs  . Financial resource strain: Not on file  . Food insecurity:    Worry: Not on file    Inability: Not on file  . Transportation needs:    Medical: Not on file    Non-medical: Not on file  Tobacco Use  . Smoking status: Former Smoker    Last attempt to quit: 11/29/1980    Years since quitting: 37.2  . Smokeless tobacco: Never Used  Substance and Sexual Activity  . Alcohol use: No  . Drug use: No  . Sexual activity: Not on file  Lifestyle  . Physical activity:    Days per week: Not on file    Minutes per session: Not on file  . Stress: Not on file  Relationships  . Social connections:    Talks on phone: Not on file    Gets together: Not on file    Attends religious service: Not on file    Active member of club or organization: Not on file    Attends meetings of clubs or organizations: Not on file    Relationship status: Not on file  Other Topics Concern  . Not on file  Social History Narrative  . Not on file     Family History: The patient's family history includes Cancer in his sister; Dementia in his mother; Diabetes in his unknown relative; Heart attack in his father; Hypertension in his unknown relative.  ROS:   Review of Systems  Constitution: Negative.  HENT: Negative.   Eyes: Negative.   Cardiovascular: Positive for chest pain and dyspnea on exertion.  Respiratory: Positive for cough and shortness of breath.   Endocrine:  Negative.   Hematologic/Lymphatic: Negative.   Skin: Negative.   Musculoskeletal: Negative.   Gastrointestinal: Negative.   Genitourinary: Negative.   Neurological: Negative.   Psychiatric/Behavioral: Negative.   Allergic/Immunologic: Negative.    Please see the history of present illness.     All other systems reviewed and are negative.  EKGs/Labs/Other Studies Reviewed:    The following studies were reviewed today:   EKG:  EKG is  ordered today.  The ekg ordered today demonstrates sinus bradycardia otherwise normal  Recent Labs: 06/24/2017: ALT 16; BUN 12; Creatinine, Ser  0.99; Hemoglobin 16.1; Platelets 211.0; Potassium 4.6; Sodium 140; TSH 2.71  Recent Lipid Panel    Component Value Date/Time   CHOL 170 06/24/2017 1226   TRIG 104.0 06/24/2017 1226   HDL 49.00 06/24/2017 1226   CHOLHDL 3 06/24/2017 1226   VLDL 20.8 06/24/2017 1226   LDLCALC 100 (H) 06/24/2017 1226    Physical Exam:    VS:  BP 102/64 (BP Location: Left Arm, Patient Position: Sitting, Cuff Size: Normal)   Pulse (!) 57   Ht 5\' 8"  (1.727 m)   Wt 207 lb 1.9 oz (93.9 kg)   SpO2 99%   BMI 31.49 kg/m     Wt Readings from Last 3 Encounters:  02/24/18 207 lb 1.9 oz (93.9 kg)  02/23/18 185 lb 3.2 oz (84 kg)  01/05/18 205 lb 9.6 oz (93.3 kg)     GEN:  Well nourished, well developed in no acute distress HEENT: Normal NECK: No JVD; No carotid bruits LYMPHATICS: No lymphadenopathy CARDIAC: 1/6 SEM LLSB RRR, no murmurs, rubs, gallops RESPIRATORY:  Clear to auscultation without rales, wheezing or rhonchi  ABDOMEN: Soft, non-tender, non-distended MUSCULOSKELETAL:  No edema; No deformity  SKIN: Warm and dry NEUROLOGIC:  Alert and oriented x 3 PSYCHIATRIC:  Normal affect     Signed, Shirlee More, MD  02/24/2018 1:15 PM    [facility]

## 2018-02-24 ENCOUNTER — Ambulatory Visit: Payer: Medicare HMO | Admitting: Cardiology

## 2018-02-24 ENCOUNTER — Encounter: Payer: Self-pay | Admitting: Cardiology

## 2018-02-24 ENCOUNTER — Ambulatory Visit (HOSPITAL_BASED_OUTPATIENT_CLINIC_OR_DEPARTMENT_OTHER)
Admission: RE | Admit: 2018-02-24 | Discharge: 2018-02-24 | Disposition: A | Discharge status: Home / Self Care | Payer: Medicare HMO | Source: Ambulatory Visit | Attending: Cardiology | Admitting: Cardiology

## 2018-02-24 VITALS — BP 102/64 | HR 57 | Ht 68.0 in | Wt 207.1 lb

## 2018-02-24 DIAGNOSIS — R0602 Shortness of breath: Secondary | ICD-10-CM | POA: Diagnosis not present

## 2018-02-24 DIAGNOSIS — R079 Chest pain, unspecified: Secondary | ICD-10-CM | POA: Insufficient documentation

## 2018-02-24 DIAGNOSIS — I10 Essential (primary) hypertension: Secondary | ICD-10-CM | POA: Diagnosis not present

## 2018-02-24 DIAGNOSIS — Z01818 Encounter for other preprocedural examination: Secondary | ICD-10-CM | POA: Insufficient documentation

## 2018-02-24 NOTE — Patient Instructions (Signed)
Medication Instructions:  Your physician has recommended you make the following change in your medication:  STOP isosorbide (Imdur) STOP verdanefil (Levitra)  Labwork: Your physician recommends that you have the following labs drawn: BMP, CBC, pt/inr  Testing/Procedures: A chest x-ray takes a picture of the organs and structures inside the chest, including the heart, lungs, and blood vessels. This test can show several things, including, whether the heart is enlarges; whether fluid is building up in the lungs; and whether pacemaker / defibrillator leads are still in place.  Your physician has requested that you have a cardiac catheterization. Cardiac catheterization is used to diagnose and/or treat various heart conditions. Doctors may recommend this procedure for a number of different reasons. The most common reason is to evaluate chest pain. Chest pain can be a symptom of coronary artery disease (CAD), and cardiac catheterization can show whether plaque is narrowing or blocking your heart's arteries. This procedure is also used to evaluate the valves, as well as measure the blood flow and oxygen levels in different parts of your heart. For further information please visit [facility]tn. Please follow instruction sheet, as given.    [facility] [address], [place] [address] Dept: [phone] Loc: [place]  02/24/2018  You are scheduled for a Cardiac Catheterization on Wednesday, April 3 with Dr. Glenetta Hew.  1. Please arrive at the [facility] (Main Entrance A) at [facility]: [place], [place] [address] at 9:00 AM (two hours before your procedure to ensure your preparation). Free valet parking service is available.   Special note: Every effort is made to have your procedure done on time. Please understand that emergencies sometimes delay  scheduled procedures.  2. Diet: Do not eat or drink anything after midnight prior to your procedure except sips of water to take medications.  3. Labs: None needed.  4. Medication instructions in preparation for your procedure:  On the morning of your procedure, take your Aspirin and any morning medicines NOT listed above.  You may use sips of water.  5. Plan for one night stay--bring personal belongings. 6. Bring a current list of your medications and current insurance cards. 7. You MUST have a responsible person to drive you home. 8. Someone MUST be with you the first 24 hours after you arrive home or your discharge will be delayed. 9. Please wear clothes that are easy to get on and off and wear slip-on shoes.  Thank you for allowing [place] to care for you!   --  Invasive Cardiovascular services   Follow-Up: Your physician recommends that you schedule a follow-up appointment in: 2 weeks.  Any Other Special Instructions Will Be Listed Below (If Applicable).     If you need a refill on your cardiac medications before your next appointment, please call your pharmacy.

## 2018-02-25 LAB — BASIC METABOLIC PANEL
BUN/Creatinine Ratio: 15 (ref 10–24)
BUN: 16 mg/dL (ref 8–27)
CALCIUM: 9.5 mg/dL (ref 8.6–10.2)
CO2: 26 mmol/L (ref 20–29)
Chloride: 103 mmol/L (ref 96–106)
Creatinine, Ser: 1.09 mg/dL (ref 0.76–1.27)
GFR, EST AFRICAN AMERICAN: 77 mL/min/{1.73_m2} (ref >59)
GFR, EST NON AFRICAN AMERICAN: 67 mL/min/{1.73_m2} (ref >59)
Glucose: 110 mg/dL — ABNORMAL HIGH (ref 65–99)
Potassium: 5.1 mmol/L (ref 3.5–5.2)
Sodium: 140 mmol/L (ref 134–144)

## 2018-02-25 LAB — PROTIME-INR
INR: 1 (ref 0.8–1.2)
Prothrombin Time: 10.7 s (ref 9.1–12.0)

## 2018-02-25 LAB — CBC
HEMATOCRIT: 44.7 % (ref 37.5–51.0)
Hemoglobin: 15.5 g/dL (ref 13.0–17.7)
MCH: 29.2 pg (ref 26.6–33.0)
MCHC: 34.7 g/dL (ref 31.5–35.7)
MCV: 84 fL (ref 79–97)
Platelets: 221 10*3/uL (ref 150–379)
RBC: 5.3 x10E6/uL (ref 4.14–5.80)
RDW: 14.7 % (ref 12.3–15.4)
WBC: 10.1 10*3/uL (ref 3.4–10.8)

## 2018-02-28 ENCOUNTER — Telehealth: Payer: Self-pay | Admitting: *Deleted

## 2018-02-28 NOTE — Telephone Encounter (Addendum)
Pt contacted pre-catheterization scheduled at [facility] for: Wednesday March 01, 2018 11:30 AM Verified arrival time and place: [place] Entrance A/[place] at: 9 AM Nothing to eat or drink after midnight prior to cath. Verified no diabetes medications.   AM meds can be  taken pre-cath with sip of water including: ASA 81 mg  Confirmed patient has responsible person to drive home post procedure and observe patient for 24 hours: yes

## 2018-03-01 ENCOUNTER — Ambulatory Visit (HOSPITAL_COMMUNITY): Payer: Medicare HMO | Admitting: Anesthesiology

## 2018-03-01 ENCOUNTER — Ambulatory Visit (HOSPITAL_COMMUNITY): Payer: Medicare HMO

## 2018-03-01 ENCOUNTER — Inpatient Hospital Stay (HOSPITAL_COMMUNITY)
Admission: RE | Disposition: A | Discharge status: Home / Self Care | Payer: Self-pay | Source: Ambulatory Visit | Attending: Surgery

## 2018-03-01 ENCOUNTER — Ambulatory Visit (HOSPITAL_COMMUNITY)
Admission: RE | Disposition: A | Discharge status: Home / Self Care | Payer: Medicare HMO | Source: Ambulatory Visit | Attending: Surgery

## 2018-03-01 ENCOUNTER — Inpatient Hospital Stay (HOSPITAL_COMMUNITY): Payer: Medicare HMO

## 2018-03-01 ENCOUNTER — Encounter (HOSPITAL_COMMUNITY): Payer: Self-pay | Admitting: Cardiology

## 2018-03-01 ENCOUNTER — Inpatient Hospital Stay (HOSPITAL_COMMUNITY)
Admission: RE | Admit: 2018-03-01 | Discharge: 2018-03-07 | DRG: 234 | Disposition: A | Discharge status: Home / Self Care | Payer: Medicare HMO | Source: Ambulatory Visit | Attending: Surgery | Admitting: Surgery

## 2018-03-01 DIAGNOSIS — I2510 Atherosclerotic heart disease of native coronary artery without angina pectoris: Secondary | ICD-10-CM

## 2018-03-01 DIAGNOSIS — R001 Bradycardia, unspecified: Secondary | ICD-10-CM | POA: Diagnosis not present

## 2018-03-01 DIAGNOSIS — J9811 Atelectasis: Secondary | ICD-10-CM | POA: Diagnosis not present

## 2018-03-01 DIAGNOSIS — Z951 Presence of aortocoronary bypass graft: Secondary | ICD-10-CM

## 2018-03-01 DIAGNOSIS — Z7982 Long term (current) use of aspirin: Secondary | ICD-10-CM

## 2018-03-01 DIAGNOSIS — I25110 Atherosclerotic heart disease of native coronary artery with unstable angina pectoris: Secondary | ICD-10-CM | POA: Diagnosis not present

## 2018-03-01 DIAGNOSIS — Z87891 Personal history of nicotine dependence: Secondary | ICD-10-CM

## 2018-03-01 DIAGNOSIS — I25119 Atherosclerotic heart disease of native coronary artery with unspecified angina pectoris: Secondary | ICD-10-CM

## 2018-03-01 DIAGNOSIS — Z8249 Family history of ischemic heart disease and other diseases of the circulatory system: Secondary | ICD-10-CM

## 2018-03-01 DIAGNOSIS — Z6831 Body mass index (BMI) 31.0-31.9, adult: Secondary | ICD-10-CM | POA: Diagnosis not present

## 2018-03-01 DIAGNOSIS — I25118 Atherosclerotic heart disease of native coronary artery with other forms of angina pectoris: Principal | ICD-10-CM | POA: Diagnosis present

## 2018-03-01 DIAGNOSIS — I25810 Atherosclerosis of coronary artery bypass graft(s) without angina pectoris: Secondary | ICD-10-CM | POA: Diagnosis not present

## 2018-03-01 DIAGNOSIS — Z09 Encounter for follow-up examination after completed treatment for conditions other than malignant neoplasm: Secondary | ICD-10-CM

## 2018-03-01 DIAGNOSIS — E785 Hyperlipidemia, unspecified: Secondary | ICD-10-CM | POA: Diagnosis not present

## 2018-03-01 DIAGNOSIS — I10 Essential (primary) hypertension: Secondary | ICD-10-CM | POA: Diagnosis present

## 2018-03-01 DIAGNOSIS — J90 Pleural effusion, not elsewhere classified: Secondary | ICD-10-CM | POA: Diagnosis not present

## 2018-03-01 DIAGNOSIS — M109 Gout, unspecified: Secondary | ICD-10-CM | POA: Diagnosis not present

## 2018-03-01 DIAGNOSIS — Z888 Allergy status to other drugs, medicaments and biological substances status: Secondary | ICD-10-CM | POA: Diagnosis not present

## 2018-03-01 DIAGNOSIS — Z79899 Other long term (current) drug therapy: Secondary | ICD-10-CM

## 2018-03-01 DIAGNOSIS — I209 Angina pectoris, unspecified: Secondary | ICD-10-CM

## 2018-03-01 DIAGNOSIS — N400 Enlarged prostate without lower urinary tract symptoms: Secondary | ICD-10-CM | POA: Diagnosis present

## 2018-03-01 DIAGNOSIS — E119 Type 2 diabetes mellitus without complications: Secondary | ICD-10-CM | POA: Diagnosis not present

## 2018-03-01 DIAGNOSIS — I081 Rheumatic disorders of both mitral and tricuspid valves: Secondary | ICD-10-CM | POA: Diagnosis not present

## 2018-03-01 DIAGNOSIS — Z86010 Personal history of colonic polyps: Secondary | ICD-10-CM

## 2018-03-01 DIAGNOSIS — E669 Obesity, unspecified: Secondary | ICD-10-CM | POA: Diagnosis not present

## 2018-03-01 DIAGNOSIS — R079 Chest pain, unspecified: Secondary | ICD-10-CM

## 2018-03-01 HISTORY — DX: Atherosclerotic heart disease of native coronary artery without angina pectoris: I25.10

## 2018-03-01 HISTORY — PX: TEE WITHOUT CARDIOVERSION: SHX5443

## 2018-03-01 HISTORY — DX: Angina pectoris, unspecified: I20.9

## 2018-03-01 HISTORY — PX: CORONARY ARTERY BYPASS GRAFT: SHX141

## 2018-03-01 HISTORY — PX: LEFT HEART CATH AND CORONARY ANGIOGRAPHY: CATH118249

## 2018-03-01 HISTORY — DX: Presence of aortocoronary bypass graft: Z95.1

## 2018-03-01 LAB — CBC
HCT: 43.9 % (ref 39.0–52.0)
Hemoglobin: 14.6 g/dL (ref 13.0–17.0)
MCH: 29 pg (ref 26.0–34.0)
MCHC: 33.3 g/dL (ref 30.0–36.0)
MCV: 87.1 fL (ref 78.0–100.0)
PLATELETS: 159 10*3/uL (ref 150–400)
RBC: 5.04 MIL/uL (ref 4.22–5.81)
RDW: 13.8 % (ref 11.5–15.5)
WBC: 14.3 10*3/uL — AB (ref 4.0–10.5)

## 2018-03-01 LAB — HEMOGLOBIN AND HEMATOCRIT, BLOOD
HCT: 34.7 % — ABNORMAL LOW (ref 39.0–52.0)
HEMOGLOBIN: 11.8 g/dL — AB (ref 13.0–17.0)

## 2018-03-01 LAB — ECHO INTRAOPERATIVE TEE
Height: 68 in
Weight: 3312 oz

## 2018-03-01 LAB — POCT I-STAT 3, ART BLOOD GAS (G3+)
Acid-base deficit: 3 mmol/L — ABNORMAL HIGH (ref 0.0–2.0)
Bicarbonate: 22.1 mmol/L (ref 20.0–28.0)
O2 Saturation: 98 %
PH ART: 7.371 (ref 7.350–7.450)
PO2 ART: 111 mmHg — AB (ref 83.0–108.0)
TCO2: 23 mmol/L (ref 22–32)
pCO2 arterial: 37.7 mmHg (ref 32.0–48.0)

## 2018-03-01 LAB — APTT: APTT: 33 s (ref 24–36)

## 2018-03-01 LAB — ABO/RH: ABO/RH(D): A POS

## 2018-03-01 LAB — POCT I-STAT 4, (NA,K, GLUC, HGB,HCT)
Glucose, Bld: 132 mg/dL — ABNORMAL HIGH (ref 65–99)
HEMATOCRIT: 42 % (ref 39.0–52.0)
HEMOGLOBIN: 14.3 g/dL (ref 13.0–17.0)
POTASSIUM: 4.2 mmol/L (ref 3.5–5.1)
Sodium: 141 mmol/L (ref 135–145)

## 2018-03-01 LAB — PLATELET COUNT: PLATELETS: 130 10*3/uL — AB (ref 150–400)

## 2018-03-01 LAB — PROTIME-INR
INR: 1.24
Prothrombin Time: 15.5 seconds — ABNORMAL HIGH (ref 11.4–15.2)

## 2018-03-01 LAB — GLUCOSE, CAPILLARY
Glucose-Capillary: 141 mg/dL — ABNORMAL HIGH (ref 65–99)
Glucose-Capillary: 99 mg/dL (ref 65–99)
Glucose-Capillary: 117 mg/dL — ABNORMAL HIGH (ref 65–99)

## 2018-03-01 LAB — PREPARE RBC (CROSSMATCH)

## 2018-03-01 LAB — SURGICAL PCR SCREEN
MRSA, PCR: NEGATIVE
Staphylococcus aureus: POSITIVE — AB

## 2018-03-01 SURGERY — LEFT HEART CATH AND CORONARY ANGIOGRAPHY
Anesthesia: LOCAL

## 2018-03-01 SURGERY — CORONARY ARTERY BYPASS GRAFTING (CABG)
Anesthesia: General | Site: Chest

## 2018-03-01 MED ORDER — SIMVASTATIN 20 MG PO TABS
20.0000 mg | ORAL_TABLET | Freq: Every day | ORAL | Status: DC
  Administered 2018-03-02 – 2018-03-06 (×5): 20 mg via ORAL
  Filled 2018-03-01 (×5): qty 1

## 2018-03-01 MED ORDER — ARTIFICIAL TEARS OPHTHALMIC OINT
TOPICAL_OINTMENT | OPHTHALMIC | Status: AC
  Filled 2018-03-01: qty 3.5

## 2018-03-01 MED ORDER — EPHEDRINE SULFATE 50 MG/ML IJ SOLN
INTRAMUSCULAR | Status: DC | PRN
  Administered 2018-03-01: 10 mg via INTRAVENOUS
  Administered 2018-03-01: 5 mg via INTRAVENOUS
  Administered 2018-03-01: 10 mg via INTRAVENOUS
  Administered 2018-03-01: 5 mg via INTRAVENOUS

## 2018-03-01 MED ORDER — HEMOSTATIC AGENTS (NO CHARGE) OPTIME
TOPICAL | Status: DC | PRN
  Administered 2018-03-01 (×2): 1 via TOPICAL

## 2018-03-01 MED ORDER — SODIUM CHLORIDE 0.9 % IV SOLN
30.0000 ug/min | INTRAVENOUS | Status: AC
  Administered 2018-03-01: 20 ug/min via INTRAVENOUS
  Filled 2018-03-01: qty 2

## 2018-03-01 MED ORDER — CHLORHEXIDINE GLUCONATE 0.12% ORAL RINSE (MEDLINE KIT)
15.0000 mL | Freq: Two times a day (BID) | OROMUCOSAL | Status: DC
  Administered 2018-03-01 – 2018-03-02 (×2): 15 mL via OROMUCOSAL

## 2018-03-01 MED ORDER — 0.9 % SODIUM CHLORIDE (POUR BTL) OPTIME
TOPICAL | Status: DC | PRN
  Administered 2018-03-01: 5000 mL

## 2018-03-01 MED ORDER — LIDOCAINE 2% (20 MG/ML) 5 ML SYRINGE
INTRAMUSCULAR | Status: DC | PRN
  Administered 2018-03-01: 90 mg via INTRAVENOUS

## 2018-03-01 MED ORDER — TRANEXAMIC ACID 1000 MG/10ML IV SOLN
1.5000 mg/kg/h | INTRAVENOUS | Status: DC
  Filled 2018-03-01: qty 25

## 2018-03-01 MED ORDER — PHENYLEPHRINE HCL 10 MG/ML IJ SOLN
INTRAMUSCULAR | Status: DC | PRN
  Administered 2018-03-01: 25 ug/min via INTRAVENOUS

## 2018-03-01 MED ORDER — SODIUM CHLORIDE 0.9 % IV SOLN
250.0000 mL | INTRAVENOUS | Status: DC | PRN
  Administered 2018-03-01: 13:00:00 via INTRAVENOUS

## 2018-03-01 MED ORDER — FAMOTIDINE IN NACL 20-0.9 MG/50ML-% IV SOLN: 20.0000 mg | Freq: Two times a day (BID) | INTRAVENOUS | Status: AC

## 2018-03-01 MED ORDER — SODIUM CHLORIDE 0.9 % IV SOLN
750.0000 mg | INTRAVENOUS | Status: DC
  Filled 2018-03-01: qty 750

## 2018-03-01 MED ORDER — SODIUM CHLORIDE 0.45 % IV SOLN
INTRAVENOUS | Status: DC | PRN
  Administered 2018-03-01: 20:00:00 via INTRAVENOUS

## 2018-03-01 MED ORDER — LACTATED RINGERS IV SOLN: 500.0000 mL | Freq: Once | INTRAVENOUS | Status: DC | PRN

## 2018-03-01 MED ORDER — MORPHINE SULFATE (PF) 4 MG/ML IV SOLN
2.0000 mg | INTRAVENOUS | Status: DC | PRN
  Administered 2018-03-01 – 2018-03-02 (×2): 4 mg via INTRAVENOUS
  Administered 2018-03-02 (×3): 2 mg via INTRAVENOUS
  Filled 2018-03-01 (×5): qty 1

## 2018-03-01 MED ORDER — HEPARIN SODIUM (PORCINE) 1000 UNIT/ML IJ SOLN
INTRAMUSCULAR | Status: DC | PRN
  Administered 2018-03-01: 5000 [IU] via INTRAVENOUS

## 2018-03-01 MED ORDER — ONDANSETRON HCL 4 MG/2ML IJ SOLN
4.0000 mg | Freq: Four times a day (QID) | INTRAMUSCULAR | Status: DC | PRN
  Administered 2018-03-02 – 2018-03-03 (×2): 4 mg via INTRAVENOUS
  Filled 2018-03-01 (×2): qty 2

## 2018-03-01 MED ORDER — SODIUM CHLORIDE 0.9 % IV SOLN: 250.0000 mL | INTRAVENOUS | Status: DC

## 2018-03-01 MED ORDER — METOPROLOL TARTRATE 5 MG/5ML IV SOLN: 2.5000 mg | INTRAVENOUS | Status: DC | PRN

## 2018-03-01 MED ORDER — FENTANYL CITRATE (PF) 100 MCG/2ML IJ SOLN
INTRAMUSCULAR | Status: DC | PRN
  Administered 2018-03-01: 50 ug via INTRAVENOUS

## 2018-03-01 MED ORDER — MIDAZOLAM HCL 2 MG/2ML IJ SOLN
INTRAMUSCULAR | Status: AC
  Filled 2018-03-01: qty 2

## 2018-03-01 MED ORDER — PROPOFOL 10 MG/ML IV BOLUS
INTRAVENOUS | Status: AC
  Filled 2018-03-01: qty 20

## 2018-03-01 MED ORDER — EPINEPHRINE PF 1 MG/ML IJ SOLN
0.0000 ug/min | INTRAVENOUS | Status: DC
  Filled 2018-03-01: qty 4

## 2018-03-01 MED ORDER — ACETAMINOPHEN 650 MG RE SUPP
650.0000 mg | Freq: Once | RECTAL | Status: AC
  Administered 2018-03-01: 650 mg via RECTAL

## 2018-03-01 MED ORDER — BISACODYL 10 MG RE SUPP: 10.0000 mg | Freq: Every day | RECTAL | Status: DC

## 2018-03-01 MED ORDER — LIDOCAINE HCL 1 % IJ SOLN
INTRAMUSCULAR | Status: AC
  Filled 2018-03-01: qty 20

## 2018-03-01 MED ORDER — ALBUMIN HUMAN 5 % IV SOLN
250.0000 mL | INTRAVENOUS | Status: AC | PRN
  Administered 2018-03-01 – 2018-03-02 (×2): 250 mL via INTRAVENOUS

## 2018-03-01 MED ORDER — ACETAMINOPHEN 160 MG/5ML PO SOLN
1000.0000 mg | Freq: Four times a day (QID) | ORAL | Status: AC
  Administered 2018-03-02: 1000 mg

## 2018-03-01 MED ORDER — PANTOPRAZOLE SODIUM 40 MG PO TBEC
40.0000 mg | DELAYED_RELEASE_TABLET | Freq: Every day | ORAL | Status: DC
  Administered 2018-03-03 – 2018-03-07 (×5): 40 mg via ORAL
  Filled 2018-03-01 (×5): qty 1

## 2018-03-01 MED ORDER — SODIUM CHLORIDE 0.9 % IJ SOLN
INTRAMUSCULAR | Status: AC
  Filled 2018-03-01: qty 10

## 2018-03-01 MED ORDER — ROCURONIUM BROMIDE 10 MG/ML (PF) SYRINGE
PREFILLED_SYRINGE | INTRAVENOUS | Status: AC
  Filled 2018-03-01: qty 5

## 2018-03-01 MED ORDER — GELATIN ABSORBABLE MT POWD
OROMUCOSAL | Status: DC | PRN
  Administered 2018-03-01 (×3): 4 mL via TOPICAL

## 2018-03-01 MED ORDER — HEPARIN (PORCINE) IN NACL 2-0.9 UNIT/ML-% IJ SOLN
INTRAMUSCULAR | Status: AC | PRN
  Administered 2018-03-01 (×3): 500 mL via INTRA_ARTERIAL

## 2018-03-01 MED ORDER — LACTATED RINGERS IV SOLN
INTRAVENOUS | Status: DC | PRN
  Administered 2018-03-01: 14:00:00 via INTRAVENOUS

## 2018-03-01 MED ORDER — ACETAMINOPHEN 160 MG/5ML PO SOLN: 650.0000 mg | Freq: Once | ORAL | Status: AC

## 2018-03-01 MED ORDER — OXYCODONE HCL 5 MG PO TABS
5.0000 mg | ORAL_TABLET | ORAL | Status: DC | PRN
  Administered 2018-03-02: 10 mg via ORAL
  Administered 2018-03-02: 5 mg via ORAL
  Administered 2018-03-03 – 2018-03-04 (×3): 10 mg via ORAL
  Administered 2018-03-04 – 2018-03-05 (×2): 5 mg via ORAL
  Administered 2018-03-06: 10 mg via ORAL
  Filled 2018-03-01 (×4): qty 2
  Filled 2018-03-01 (×2): qty 1
  Filled 2018-03-01: qty 2
  Filled 2018-03-01: qty 1

## 2018-03-01 MED ORDER — LACTATED RINGERS IV SOLN: INTRAVENOUS | Status: DC

## 2018-03-01 MED ORDER — DOCUSATE SODIUM 100 MG PO CAPS
200.0000 mg | ORAL_CAPSULE | Freq: Every day | ORAL | Status: DC
  Administered 2018-03-02 – 2018-03-06 (×5): 200 mg via ORAL
  Filled 2018-03-01 (×6): qty 2

## 2018-03-01 MED ORDER — MAGNESIUM SULFATE 4 GM/100ML IV SOLN
4.0000 g | Freq: Once | INTRAVENOUS | Status: AC
  Administered 2018-03-01: 4 g via INTRAVENOUS
  Filled 2018-03-01: qty 100

## 2018-03-01 MED ORDER — SODIUM CHLORIDE 0.9 % IV SOLN
INTRAVENOUS | Status: DC
  Administered 2018-03-01: 20:00:00 via INTRAVENOUS

## 2018-03-01 MED ORDER — NITROGLYCERIN IN D5W 200-5 MCG/ML-% IV SOLN: 0.0000 ug/min | INTRAVENOUS | Status: DC

## 2018-03-01 MED ORDER — ORAL CARE MOUTH RINSE
15.0000 mL | Freq: Four times a day (QID) | OROMUCOSAL | Status: DC
  Administered 2018-03-02 – 2018-03-03 (×3): 15 mL via OROMUCOSAL

## 2018-03-01 MED ORDER — HEPARIN (PORCINE) IN NACL 2-0.9 UNIT/ML-% IJ SOLN
INTRAMUSCULAR | Status: AC
  Filled 2018-03-01: qty 500

## 2018-03-01 MED ORDER — ACETAMINOPHEN 500 MG PO TABS
1000.0000 mg | ORAL_TABLET | Freq: Four times a day (QID) | ORAL | Status: AC
  Administered 2018-03-02 – 2018-03-06 (×15): 1000 mg via ORAL
  Filled 2018-03-01 (×16): qty 2

## 2018-03-01 MED ORDER — PLASMA-LYTE 148 IV SOLN
INTRAVENOUS | Status: AC
  Administered 2018-03-01: 500 mL
  Filled 2018-03-01: qty 2.5

## 2018-03-01 MED ORDER — SODIUM CHLORIDE 0.9% FLUSH
3.0000 mL | Freq: Two times a day (BID) | INTRAVENOUS | Status: DC
  Administered 2018-03-02 – 2018-03-05 (×7): 3 mL via INTRAVENOUS

## 2018-03-01 MED ORDER — MIDAZOLAM HCL 2 MG/2ML IJ SOLN
INTRAMUSCULAR | Status: DC | PRN
  Administered 2018-03-01: 2 mg via INTRAVENOUS

## 2018-03-01 MED ORDER — BISACODYL 5 MG PO TBEC
10.0000 mg | DELAYED_RELEASE_TABLET | Freq: Every day | ORAL | Status: DC
  Administered 2018-03-02 – 2018-03-06 (×5): 10 mg via ORAL
  Filled 2018-03-01 (×6): qty 2

## 2018-03-01 MED ORDER — GLYCOPYRROLATE 0.2 MG/ML IJ SOLN
INTRAMUSCULAR | Status: DC | PRN
  Administered 2018-03-01: 0.2 mg via INTRAVENOUS

## 2018-03-01 MED ORDER — HEPARIN SODIUM (PORCINE) 1000 UNIT/ML IJ SOLN
INTRAMUSCULAR | Status: DC | PRN
Start: 2018-03-01 — End: 2018-03-01
  Administered 2018-03-01: 29000 [IU] via INTRAVENOUS

## 2018-03-01 MED ORDER — SODIUM CHLORIDE 0.9 % IV SOLN
INTRAVENOUS | Status: DC
  Filled 2018-03-01: qty 1

## 2018-03-01 MED ORDER — HEPARIN SODIUM (PORCINE) 1000 UNIT/ML IJ SOLN
INTRAMUSCULAR | Status: AC
  Filled 2018-03-01: qty 1

## 2018-03-01 MED ORDER — THROMBIN 20000 UNITS EX SOLR
CUTANEOUS | Status: DC | PRN
  Administered 2018-03-01: 20000 [IU] via TOPICAL

## 2018-03-01 MED ORDER — SODIUM CHLORIDE 0.9 % WEIGHT BASED INFUSION
3.0000 mL/kg/h | INTRAVENOUS | Status: DC
  Administered 2018-03-01: 3 mL/kg/h via INTRAVENOUS

## 2018-03-01 MED ORDER — FENTANYL CITRATE (PF) 250 MCG/5ML IJ SOLN
INTRAMUSCULAR | Status: DC | PRN
  Administered 2018-03-01: 250 ug via INTRAVENOUS
  Administered 2018-03-01: 200 ug via INTRAVENOUS
  Administered 2018-03-01: 150 ug via INTRAVENOUS
  Administered 2018-03-01: 100 ug via INTRAVENOUS
  Administered 2018-03-01: 200 ug via INTRAVENOUS
  Administered 2018-03-01: 100 ug via INTRAVENOUS

## 2018-03-01 MED ORDER — DEXMEDETOMIDINE HCL IN NACL 200 MCG/50ML IV SOLN
0.0000 ug/kg/h | INTRAVENOUS | Status: DC
  Administered 2018-03-01: 0.7 ug/kg/h via INTRAVENOUS
  Filled 2018-03-01: qty 50

## 2018-03-01 MED ORDER — ASPIRIN 81 MG PO CHEW: 324.0000 mg | CHEWABLE_TABLET | Freq: Every day | ORAL | Status: DC

## 2018-03-01 MED ORDER — CHLORHEXIDINE GLUCONATE CLOTH 2 % EX PADS
6.0000 | MEDICATED_PAD | Freq: Every day | CUTANEOUS | Status: DC
  Administered 2018-03-02 – 2018-03-04 (×3): 6 via TOPICAL

## 2018-03-01 MED ORDER — MILRINONE LACTATE IN DEXTROSE 20-5 MG/100ML-% IV SOLN
0.1250 ug/kg/min | INTRAVENOUS | Status: DC
  Filled 2018-03-01: qty 100

## 2018-03-01 MED ORDER — POTASSIUM CHLORIDE 10 MEQ/50ML IV SOLN: 10.0000 meq | INTRAVENOUS | Status: AC

## 2018-03-01 MED ORDER — PROTAMINE SULFATE 10 MG/ML IV SOLN
INTRAVENOUS | Status: AC
  Filled 2018-03-01: qty 25

## 2018-03-01 MED ORDER — METOPROLOL TARTRATE 25 MG/10 ML ORAL SUSPENSION: 12.5000 mg | Freq: Two times a day (BID) | ORAL | Status: DC

## 2018-03-01 MED ORDER — FENTANYL CITRATE (PF) 100 MCG/2ML IJ SOLN
INTRAMUSCULAR | Status: AC
  Filled 2018-03-01: qty 2

## 2018-03-01 MED ORDER — TRANEXAMIC ACID (OHS) PUMP PRIME SOLUTION
2.0000 mg/kg | INTRAVENOUS | Status: DC
  Filled 2018-03-01: qty 1.88

## 2018-03-01 MED ORDER — TAMSULOSIN HCL 0.4 MG PO CAPS
0.4000 mg | ORAL_CAPSULE | Freq: Every day | ORAL | Status: DC
  Administered 2018-03-02 – 2018-03-07 (×6): 0.4 mg via ORAL
  Filled 2018-03-01 (×6): qty 1

## 2018-03-01 MED ORDER — CHLORHEXIDINE GLUCONATE 0.12 % MT SOLN: 15.0000 mL | OROMUCOSAL | Status: DC

## 2018-03-01 MED ORDER — TRAMADOL HCL 50 MG PO TABS
50.0000 mg | ORAL_TABLET | ORAL | Status: DC | PRN
  Administered 2018-03-02 – 2018-03-05 (×2): 100 mg via ORAL
  Administered 2018-03-07: 50 mg via ORAL
  Filled 2018-03-01: qty 2
  Filled 2018-03-01: qty 1
  Filled 2018-03-01: qty 2
  Filled 2018-03-01: qty 1

## 2018-03-01 MED ORDER — LIDOCAINE HCL (PF) 1 % IJ SOLN
INTRAMUSCULAR | Status: DC | PRN
  Administered 2018-03-01: 9 mL
  Administered 2018-03-01: 4 mL

## 2018-03-01 MED ORDER — DEXMEDETOMIDINE HCL IN NACL 400 MCG/100ML IV SOLN
0.1000 ug/kg/h | INTRAVENOUS | Status: DC
  Filled 2018-03-01: qty 100

## 2018-03-01 MED ORDER — PHENYLEPHRINE HCL 10 MG/ML IJ SOLN
INTRAMUSCULAR | Status: DC | PRN
  Administered 2018-03-01: 80 ug via INTRAVENOUS

## 2018-03-01 MED ORDER — TRANEXAMIC ACID (OHS) BOLUS VIA INFUSION
15.0000 mg/kg | INTRAVENOUS | Status: AC
  Administered 2018-03-01: 1408.5 mg via INTRAVENOUS
  Filled 2018-03-01: qty 1409

## 2018-03-01 MED ORDER — ARTIFICIAL TEARS OPHTHALMIC OINT
TOPICAL_OINTMENT | OPHTHALMIC | Status: DC | PRN
  Administered 2018-03-01: 1 via OPHTHALMIC

## 2018-03-01 MED ORDER — ASPIRIN EC 325 MG PO TBEC
325.0000 mg | DELAYED_RELEASE_TABLET | Freq: Every day | ORAL | Status: DC
  Administered 2018-03-02 – 2018-03-07 (×6): 325 mg via ORAL
  Filled 2018-03-01 (×6): qty 1

## 2018-03-01 MED ORDER — CEFAZOLIN SODIUM-DEXTROSE 2-4 GM/100ML-% IV SOLN
2.0000 g | Freq: Three times a day (TID) | INTRAVENOUS | Status: AC
  Administered 2018-03-01 – 2018-03-03 (×6): 2 g via INTRAVENOUS
  Filled 2018-03-01 (×6): qty 100

## 2018-03-01 MED ORDER — SODIUM CHLORIDE 0.9% FLUSH: 3.0000 mL | INTRAVENOUS | Status: DC | PRN

## 2018-03-01 MED ORDER — PHENYLEPHRINE HCL 10 MG/ML IJ SOLN
0.0000 ug/min | INTRAMUSCULAR | Status: DC
  Administered 2018-03-01: 10 ug/min via INTRAVENOUS
  Administered 2018-03-02: 30 ug/min via INTRAVENOUS
  Filled 2018-03-01: qty 2
  Filled 2018-03-01: qty 20

## 2018-03-01 MED ORDER — PROTAMINE SULFATE 10 MG/ML IV SOLN
INTRAVENOUS | Status: AC
  Filled 2018-03-01: qty 5

## 2018-03-01 MED ORDER — INSULIN REGULAR HUMAN 100 UNIT/ML IJ SOLN
INTRAMUSCULAR | Status: DC | PRN
  Administered 2018-03-01: 1.1 [IU]/h via INTRAVENOUS

## 2018-03-01 MED ORDER — VERAPAMIL HCL 2.5 MG/ML IV SOLN
INTRAVENOUS | Status: DC | PRN
  Administered 2018-03-01: 12:00:00 via INTRA_ARTERIAL

## 2018-03-01 MED ORDER — VANCOMYCIN HCL IN DEXTROSE 1-5 GM/200ML-% IV SOLN
1000.0000 mg | Freq: Once | INTRAVENOUS | Status: AC
  Administered 2018-03-02: 1000 mg via INTRAVENOUS
  Filled 2018-03-01: qty 200

## 2018-03-01 MED ORDER — SODIUM CHLORIDE 0.9 % IV SOLN
INTRAVENOUS | Status: DC | PRN
  Administered 2018-03-01: 0.3 ug/kg/h via INTRAVENOUS

## 2018-03-01 MED ORDER — MIDAZOLAM HCL 5 MG/5ML IJ SOLN
INTRAMUSCULAR | Status: DC | PRN
  Administered 2018-03-01 (×2): 5 mg via INTRAVENOUS

## 2018-03-01 MED ORDER — DOPAMINE-DEXTROSE 3.2-5 MG/ML-% IV SOLN
0.0000 ug/kg/min | INTRAVENOUS | Status: DC
  Filled 2018-03-01: qty 250

## 2018-03-01 MED ORDER — NITROGLYCERIN IN D5W 200-5 MCG/ML-% IV SOLN
2.0000 ug/min | INTRAVENOUS | Status: DC
  Filled 2018-03-01: qty 250

## 2018-03-01 MED ORDER — PROTAMINE SULFATE 10 MG/ML IV SOLN
INTRAVENOUS | Status: DC | PRN
  Administered 2018-03-01: 25 mg via INTRAVENOUS
  Administered 2018-03-01 (×3): 50 mg via INTRAVENOUS
  Administered 2018-03-01: 25 mg via INTRAVENOUS
  Administered 2018-03-01 (×2): 50 mg via INTRAVENOUS

## 2018-03-01 MED ORDER — METOPROLOL TARTRATE 12.5 MG HALF TABLET: 12.5000 mg | ORAL_TABLET | Freq: Two times a day (BID) | ORAL | Status: DC

## 2018-03-01 MED ORDER — LACTATED RINGERS IV SOLN
INTRAVENOUS | Status: DC
  Administered 2018-03-03: 22:00:00 via INTRAVENOUS

## 2018-03-01 MED ORDER — IOHEXOL 350 MG/ML SOLN
INTRAVENOUS | Status: DC | PRN
  Administered 2018-03-01: 80 mL via INTRAVENOUS

## 2018-03-01 MED ORDER — SODIUM CHLORIDE 0.9 % IV SOLN
INTRAVENOUS | Status: DC
  Filled 2018-03-01: qty 30

## 2018-03-01 MED ORDER — SODIUM CHLORIDE 0.9 % IV SOLN
1.5000 g | INTRAVENOUS | Status: AC
  Administered 2018-03-01: 1.5 g via INTRAVENOUS
  Filled 2018-03-01: qty 1.5

## 2018-03-01 MED ORDER — MORPHINE SULFATE (PF) 4 MG/ML IV SOLN: 1.0000 mg | INTRAVENOUS | Status: AC | PRN

## 2018-03-01 MED ORDER — MIDAZOLAM HCL 10 MG/2ML IJ SOLN
INTRAMUSCULAR | Status: AC
  Filled 2018-03-01: qty 2

## 2018-03-01 MED ORDER — SODIUM CHLORIDE 0.9 % IV SOLN
INTRAVENOUS | Status: DC | PRN
  Administered 2018-03-01: 750 mg via INTRAVENOUS

## 2018-03-01 MED ORDER — ASPIRIN 81 MG PO CHEW: 81.0000 mg | CHEWABLE_TABLET | ORAL | Status: DC

## 2018-03-01 MED ORDER — VANCOMYCIN HCL 10 G IV SOLR
1500.0000 mg | INTRAVENOUS | Status: AC
  Administered 2018-03-01: 1500 mg via INTRAVENOUS
  Filled 2018-03-01: qty 1500

## 2018-03-01 MED ORDER — ROCURONIUM BROMIDE 10 MG/ML (PF) SYRINGE
PREFILLED_SYRINGE | INTRAVENOUS | Status: DC | PRN
  Administered 2018-03-01: 40 mg via INTRAVENOUS
  Administered 2018-03-01 (×2): 50 mg via INTRAVENOUS
  Administered 2018-03-01: 60 mg via INTRAVENOUS

## 2018-03-01 MED ORDER — FENTANYL CITRATE (PF) 250 MCG/5ML IJ SOLN
INTRAMUSCULAR | Status: AC
  Filled 2018-03-01: qty 20

## 2018-03-01 MED ORDER — MAGNESIUM SULFATE 50 % IJ SOLN
40.0000 meq | INTRAMUSCULAR | Status: DC
  Filled 2018-03-01: qty 9.85

## 2018-03-01 MED ORDER — SODIUM CHLORIDE 0.9 % WEIGHT BASED INFUSION: 1.0000 mL/kg/h | INTRAVENOUS | Status: DC

## 2018-03-01 MED ORDER — SODIUM CHLORIDE 0.9% FLUSH: 3.0000 mL | Freq: Two times a day (BID) | INTRAVENOUS | Status: DC

## 2018-03-01 MED ORDER — POTASSIUM CHLORIDE 2 MEQ/ML IV SOLN
80.0000 meq | INTRAVENOUS | Status: DC
  Filled 2018-03-01: qty 40

## 2018-03-01 MED ORDER — MUPIROCIN 2 % EX OINT
1.0000 "application " | TOPICAL_OINTMENT | Freq: Two times a day (BID) | CUTANEOUS | Status: AC
  Administered 2018-03-02 – 2018-03-06 (×10): 1 via NASAL
  Filled 2018-03-01: qty 22

## 2018-03-01 MED ORDER — VERAPAMIL HCL 2.5 MG/ML IV SOLN
INTRAVENOUS | Status: AC
  Filled 2018-03-01: qty 2

## 2018-03-01 MED ORDER — LIDOCAINE HCL (CARDIAC) 20 MG/ML IV SOLN
INTRAVENOUS | Status: AC
  Filled 2018-03-01: qty 5

## 2018-03-01 MED ORDER — INSULIN REGULAR BOLUS VIA INFUSION
0.0000 [IU] | Freq: Three times a day (TID) | INTRAVENOUS | Status: DC
  Filled 2018-03-01: qty 10

## 2018-03-01 MED ORDER — ETOMIDATE 2 MG/ML IV SOLN
INTRAVENOUS | Status: AC
  Filled 2018-03-01: qty 10

## 2018-03-01 MED ORDER — PROPOFOL 10 MG/ML IV BOLUS
INTRAVENOUS | Status: DC | PRN
  Administered 2018-03-01: 50 mg via INTRAVENOUS

## 2018-03-01 MED ORDER — MIDAZOLAM HCL 2 MG/2ML IJ SOLN
2.0000 mg | INTRAMUSCULAR | Status: DC | PRN
  Administered 2018-03-01: 2 mg via INTRAVENOUS
  Filled 2018-03-01: qty 2

## 2018-03-01 MED ORDER — SODIUM CHLORIDE 0.9% FLUSH
3.0000 mL | INTRAVENOUS | Status: DC | PRN
  Administered 2018-03-05: 3 mL via INTRAVENOUS
  Filled 2018-03-01: qty 3

## 2018-03-01 SURGICAL SUPPLY — 108 items
ADAPTER CARDIO PERF ANTE/RETRO (ADAPTER) ×2 IMPLANT
ADH SKN CLS APL DERMABOND .7 (GAUZE/BANDAGES/DRESSINGS) ×2
ADPR PRFSN 84XANTGRD RTRGD (ADAPTER) ×2
BAG DECANTER FOR FLEXI CONT (MISCELLANEOUS) ×4 IMPLANT
BANDAGE ACE 4X5 VEL STRL LF (GAUZE/BANDAGES/DRESSINGS) ×4 IMPLANT
BANDAGE ACE 6X5 VEL STRL LF (GAUZE/BANDAGES/DRESSINGS) ×4 IMPLANT
BASKET HEART  (ORDER IN 25'S) (MISCELLANEOUS) ×1
BASKET HEART (ORDER IN 25'S) (MISCELLANEOUS) ×1
BASKET HEART (ORDER IN 25S) (MISCELLANEOUS) ×2 IMPLANT
BLADE STERNUM SYSTEM 6 (BLADE) ×4 IMPLANT
BNDG GAUZE ELAST 4 BULKY (GAUZE/BANDAGES/DRESSINGS) ×4 IMPLANT
CANISTER SUCT 3000ML PPV (MISCELLANEOUS) ×4 IMPLANT
CANNULA GUNDRY RCSP 15FR (MISCELLANEOUS) ×2 IMPLANT
CATH ROBINSON RED A/P 18FR (CATHETERS) ×10 IMPLANT
CATH THORACIC 28FR (CATHETERS) ×4 IMPLANT
CATH THORACIC 36FR (CATHETERS) ×4 IMPLANT
CATH THORACIC 36FR RT ANG (CATHETERS) ×4 IMPLANT
CLIP RETRACTION 3.0MM CORONARY (MISCELLANEOUS) ×2 IMPLANT
CLIP VESOCCLUDE MED 24/CT (CLIP) IMPLANT
CLIP VESOCCLUDE SM WIDE 24/CT (CLIP) ×2 IMPLANT
CRADLE DONUT ADULT HEAD (MISCELLANEOUS) ×4 IMPLANT
DERMABOND ADVANCED (GAUZE/BANDAGES/DRESSINGS) ×2
DERMABOND ADVANCED .7 DNX12 (GAUZE/BANDAGES/DRESSINGS) IMPLANT
DRAPE CARDIOVASCULAR INCISE (DRAPES) ×4
DRAPE SLUSH/WARMER DISC (DRAPES) ×4 IMPLANT
DRAPE SRG 135X102X78XABS (DRAPES) ×2 IMPLANT
DRSG COVADERM 4X14 (GAUZE/BANDAGES/DRESSINGS) ×4 IMPLANT
ELECT CAUTERY BLADE 6.4 (BLADE) ×4 IMPLANT
ELECT REM PT RETURN 9FT ADLT (ELECTROSURGICAL) ×8
ELECTRODE REM PT RTRN 9FT ADLT (ELECTROSURGICAL) ×4 IMPLANT
FELT TEFLON 1X6 (MISCELLANEOUS) ×8 IMPLANT
GAUZE SPONGE 4X4 12PLY STRL (GAUZE/BANDAGES/DRESSINGS) ×8 IMPLANT
GLOVE BIO SURGEON STRL SZ 6 (GLOVE) IMPLANT
GLOVE BIO SURGEON STRL SZ 6.5 (GLOVE) ×5 IMPLANT
GLOVE BIO SURGEON STRL SZ7 (GLOVE) IMPLANT
GLOVE BIO SURGEON STRL SZ7.5 (GLOVE) ×2 IMPLANT
GLOVE BIO SURGEONS STRL SZ 6.5 (GLOVE) ×5
GLOVE BIOGEL PI IND STRL 6 (GLOVE) IMPLANT
GLOVE BIOGEL PI IND STRL 6.5 (GLOVE) IMPLANT
GLOVE BIOGEL PI IND STRL 7.0 (GLOVE) IMPLANT
GLOVE BIOGEL PI INDICATOR 6 (GLOVE) ×8
GLOVE BIOGEL PI INDICATOR 6.5 (GLOVE) ×2
GLOVE BIOGEL PI INDICATOR 7.0 (GLOVE) ×4
GLOVE EUDERMIC 7 POWDERFREE (GLOVE) ×10 IMPLANT
GLOVE ORTHO TXT STRL SZ7.5 (GLOVE) IMPLANT
GOWN STRL REUS W/ TWL LRG LVL3 (GOWN DISPOSABLE) ×8 IMPLANT
GOWN STRL REUS W/ TWL XL LVL3 (GOWN DISPOSABLE) ×2 IMPLANT
GOWN STRL REUS W/TWL LRG LVL3 (GOWN DISPOSABLE) ×24
GOWN STRL REUS W/TWL XL LVL3 (GOWN DISPOSABLE) ×8
HEMOSTAT POWDER SURGIFOAM 1G (HEMOSTASIS) ×12 IMPLANT
HEMOSTAT SURGICEL 2X14 (HEMOSTASIS) ×4 IMPLANT
INSERT FOGARTY 61MM (MISCELLANEOUS) IMPLANT
INSERT FOGARTY XLG (MISCELLANEOUS) IMPLANT
K-WIRE PROS 0.6X70 (WIRE) ×12
KIT BASIN OR (CUSTOM PROCEDURE TRAY) ×4 IMPLANT
KIT CATH CPB BARTLE (MISCELLANEOUS) ×4 IMPLANT
KIT SUCTION CATH 14FR (SUCTIONS) ×4 IMPLANT
KIT TURNOVER KIT B (KITS) ×4 IMPLANT
KIT VASOVIEW HEMOPRO VH 3000 (KITS) ×4 IMPLANT
KWIRE PROS 0.6X70 (WIRE) IMPLANT
NS IRRIG 1000ML POUR BTL (IV SOLUTION) ×22 IMPLANT
PACK E OPEN HEART (SUTURE) ×4 IMPLANT
PACK OPEN HEART (CUSTOM PROCEDURE TRAY) ×4 IMPLANT
PAD ARMBOARD 7.5X6 YLW CONV (MISCELLANEOUS) ×8 IMPLANT
PAD ELECT DEFIB RADIOL ZOLL (MISCELLANEOUS) ×4 IMPLANT
PENCIL BUTTON HOLSTER BLD 10FT (ELECTRODE) ×4 IMPLANT
PUNCH AORTIC ROTATE 4.0MM (MISCELLANEOUS) IMPLANT
PUNCH AORTIC ROTATE 4.5MM 8IN (MISCELLANEOUS) ×4 IMPLANT
PUNCH AORTIC ROTATE 5MM 8IN (MISCELLANEOUS) IMPLANT
SEALANT SURG COSEAL 8ML (VASCULAR PRODUCTS) ×2 IMPLANT
SET CARDIOPLEGIA MPS 5001102 (MISCELLANEOUS) ×2 IMPLANT
SPONGE INTESTINAL PEANUT (DISPOSABLE) IMPLANT
SPONGE LAP 18X18 X RAY DECT (DISPOSABLE) ×2 IMPLANT
SPONGE LAP 4X18 X RAY DECT (DISPOSABLE) ×6 IMPLANT
SUT BONE WAX W31G (SUTURE) ×4 IMPLANT
SUT MNCRL AB 4-0 PS2 18 (SUTURE) IMPLANT
SUT PROLENE 3 0 SH DA (SUTURE) IMPLANT
SUT PROLENE 3 0 SH1 36 (SUTURE) ×4 IMPLANT
SUT PROLENE 4 0 RB 1 (SUTURE) ×8
SUT PROLENE 4 0 SH DA (SUTURE) IMPLANT
SUT PROLENE 4-0 RB1 .5 CRCL 36 (SUTURE) IMPLANT
SUT PROLENE 5 0 C 1 36 (SUTURE) IMPLANT
SUT PROLENE 6 0 C 1 30 (SUTURE) ×2 IMPLANT
SUT PROLENE 7 0 BV 1 (SUTURE) IMPLANT
SUT PROLENE 7 0 BV1 MDA (SUTURE) ×4 IMPLANT
SUT PROLENE 8 0 BV175 6 (SUTURE) ×2 IMPLANT
SUT SILK  1 MH (SUTURE)
SUT SILK 1 MH (SUTURE) IMPLANT
SUT STEEL STERNAL CCS#1 18IN (SUTURE) IMPLANT
SUT STEEL SZ 6 DBL 3X14 BALL (SUTURE) IMPLANT
SUT VIC AB 1 CTX 36 (SUTURE) ×8
SUT VIC AB 1 CTX36XBRD ANBCTR (SUTURE) ×4 IMPLANT
SUT VIC AB 2-0 CT1 27 (SUTURE) ×4
SUT VIC AB 2-0 CT1 TAPERPNT 27 (SUTURE) IMPLANT
SUT VIC AB 2-0 CTX 27 (SUTURE) IMPLANT
SUT VIC AB 3-0 SH 27 (SUTURE)
SUT VIC AB 3-0 SH 27X BRD (SUTURE) IMPLANT
SUT VIC AB 3-0 X1 27 (SUTURE) ×2 IMPLANT
SUT VICRYL 4-0 PS2 18IN ABS (SUTURE) IMPLANT
SYSTEM SAHARA CHEST DRAIN ATS (WOUND CARE) ×4 IMPLANT
TAPE CLOTH SURG 4X10 WHT LF (GAUZE/BANDAGES/DRESSINGS) ×2 IMPLANT
TAPE PAPER 2X10 WHT MICROPORE (GAUZE/BANDAGES/DRESSINGS) ×2 IMPLANT
TOWEL GREEN STERILE (TOWEL DISPOSABLE) ×4 IMPLANT
TOWEL GREEN STERILE FF (TOWEL DISPOSABLE) ×2 IMPLANT
TRAY FOLEY SILVER 16FR TEMP (SET/KITS/TRAYS/PACK) ×4 IMPLANT
TUBING INSUFFLATION (TUBING) ×4 IMPLANT
UNDERPAD 30X30 (UNDERPADS AND DIAPERS) ×4 IMPLANT
WATER STERILE IRR 1000ML POUR (IV SOLUTION) ×8 IMPLANT

## 2018-03-01 SURGICAL SUPPLY — 14 items
BALLN IABP SENSA PLUS 8F 50CC (BALLOONS) ×3
BALLOON IABP SENS PLUS 8F 50CC (BALLOONS) IMPLANT
BAND CMPR LRG ZPHR (HEMOSTASIS) ×2
BAND ZEPHYR COMPRESS 30 LONG (HEMOSTASIS) ×1 IMPLANT
CATH OPTITORQUE TIG 4.0 5F (CATHETERS) ×1 IMPLANT
ELECT DEFIB PAD ADLT CADENCE (PAD) ×1 IMPLANT
GLIDESHEATH SLEND A-KIT 6F 22G (SHEATH) ×1 IMPLANT
GUIDEWIRE INQWIRE 1.5J.035X260 (WIRE) IMPLANT
INQWIRE 1.5J .035X260CM (WIRE) ×3
KIT HEART LEFT (KITS) ×3 IMPLANT
PACK CARDIAC CATHETERIZATION (CUSTOM PROCEDURE TRAY) ×3 IMPLANT
SHEATH AVANTI 11CM 5FR (SHEATH) ×1 IMPLANT
TRANSDUCER W/STOPCOCK (MISCELLANEOUS) ×3 IMPLANT
TUBING CIL FLEX 10 FLL-RA (TUBING) ×3 IMPLANT

## 2018-03-01 NOTE — Progress Notes (Signed)
Patient ID: Vincent Stevens, male   DOB: November 13, 1946, 72 y.o.   MRN: [id number]  TCTS Evening Rounds:   Hemodynamically stable  CI = 1.9  Has started to wake up on vent.  Urine output good  CT output low  CBC    Component Value Date/Time   WBC 14.3 (H) 03/01/2018 1839   RBC 5.04 03/01/2018 1839   HGB 14.3 03/01/2018 1847   HGB 15.5 02/24/2018 0845   HCT 42.0 03/01/2018 1847   HCT 44.7 02/24/2018 0845   PLT 159 03/01/2018 1839   PLT 221 02/24/2018 0845   MCV 87.1 03/01/2018 1839   MCV 84 02/24/2018 0845   MCH 29.0 03/01/2018 1839   MCHC 33.3 03/01/2018 1839   RDW 13.8 03/01/2018 1839   RDW 14.7 02/24/2018 0845   LYMPHSABS 1.6 06/24/2017 1226   MONOABS 0.5 06/24/2017 1226   EOSABS 0.2 06/24/2017 1226   BASOSABS 0.0 06/24/2017 1226     BMET    Component Value Date/Time   NA 141 03/01/2018 1847   NA 140 02/24/2018 0845   K 4.2 03/01/2018 1847   CL 103 02/24/2018 0845   CO2 26 02/24/2018 0845   GLUCOSE 132 (H) 03/01/2018 1847   BUN 16 02/24/2018 0845   CREATININE 1.09 02/24/2018 0845   CALCIUM 9.5 02/24/2018 0845   GFRNONAA 67 02/24/2018 0845   GFRAA 77 02/24/2018 0845     A/P:  Stable postop course. Continue current plans. Will remove IABP and right radial artery shealth in am.

## 2018-03-01 NOTE — Anesthesia Procedure Notes (Cosign Needed Addendum)
Procedure Name: Intubation Date/Time: 03/01/2018 1:37 PM Performed by: Leonor Liv, CRNA Pre-anesthesia Checklist: Emergency Drugs available, Suction available, Patient being monitored, Timeout performed and Patient identified Patient Re-evaluated:Patient Re-evaluated prior to induction Oxygen Delivery Method: Circle system utilized Preoxygenation: Pre-oxygenation with 100% oxygen Induction Type: IV induction Ventilation: Mask ventilation without difficulty Laryngoscope Size: Mac and 4 Grade View: Grade I Tube type: Subglottic suction tube Tube size: 8.0 mm Number of attempts: 1 Airway Equipment and Method: Stylet Placement Confirmation: ETT inserted through vocal cords under direct vision,  positive ETCO2,  CO2 detector and breath sounds checked- equal and bilateral Secured at: 23 cm Tube secured with: Tape Dental Injury: Teeth and Oropharynx as per pre-operative assessment  Comments: Intubation completed by Essie Hart

## 2018-03-01 NOTE — Progress Notes (Signed)
  Echocardiogram Echocardiogram Transesophageal has been performed.  Vincent Stevens 03/01/2018, 2:26 PM

## 2018-03-01 NOTE — Anesthesia Preprocedure Evaluation (Addendum)
Anesthesia Evaluation  Patient identified by MRN, date of birth, ID band Patient awake    Reviewed: Allergy & Precautions, NPO status , Patient's Chart, lab work & pertinent test results  Airway Mallampati: II  TM Distance: >3 FB Neck ROM: Full    Dental  (+) Partial Upper, Partial Lower   Pulmonary former smoker,    Pulmonary exam normal breath sounds clear to auscultation       Cardiovascular hypertension, Pt. on medications + angina + CAD  Normal cardiovascular exam Rhythm:Regular Rate:Normal  ECG: SB, rate 50   Neuro/Psych negative neurological ROS  negative psych ROS   GI/Hepatic negative GI ROS, Neg liver ROS,   Endo/Other  negative endocrine ROS  Renal/GU negative Renal ROS     Musculoskeletal negative musculoskeletal ROS (+)   Abdominal (+) + obese,   Peds  Hematology HLD   Anesthesia Other Findings   Reproductive/Obstetrics                            Anesthesia Physical Anesthesia Plan  ASA: IV and emergent  Anesthesia Plan: General   Post-op Pain Management:    Induction: Intravenous  PONV Risk Score and Plan: 2 and Treatment may vary due to age or medical condition  Airway Management Planned: Oral ETT  Additional Equipment: Arterial line, CVP, PA Cath, TEE and Ultrasound Guidance Line Placement  Intra-op Plan:   Post-operative Plan: Post-operative intubation/ventilation  Informed Consent: I have reviewed the patients History and Physical, chart, labs and discussed the procedure including the risks, benefits and alternatives for the proposed anesthesia with the patient or authorized representative who has indicated his/her understanding and acceptance.   Dental advisory given  Plan Discussed with: CRNA  Anesthesia Plan Comments: (Critical left main disease IABP on 1:1 Patient evaluated in cath lab)       Anesthesia Quick Evaluation

## 2018-03-01 NOTE — Research (Signed)
CADFEM Informed Consent   Subject Name: Vincent Stevens  Subject met inclusion and exclusion criteria.  The informed consent form, study requirements and expectations were reviewed with the subject and questions and concerns were addressed prior to the signing of the consent form.  The subject verbalized understanding of the trail requirements.  The subject agreed to participate in the CADFEM trial and signed the informed consent.  The informed consent was obtained prior to performance of any protocol-specific procedures for the subject.  A copy of the signed informed consent was given to the subject and a copy was placed in the subject's medical record.  Christena Flake 03/01/2018, 09:35 AM

## 2018-03-01 NOTE — Transfer of Care (Signed)
Immediate Anesthesia Transfer of Care Note  Patient: Vincent Stevens  Procedure(s) Performed: CORONARY ARTERY BYPASS GRAFTING (CABG) times three using the right saphaneous vein. Harvested endoscopicly; and left internal mammary artery. (N/A Chest) TRANSESOPHAGEAL ECHOCARDIOGRAM (TEE) (N/A )  Patient Location: ICU  Anesthesia Type:General  Level of Consciousness: Patient remains intubated per anesthesia plan  Airway & Oxygen Therapy: Patient remains intubated per anesthesia plan and Patient placed on Ventilator (see vital sign flow sheet for setting)  Post-op Assessment: Report given to RN  Post vital signs: Reviewed and stable  Last Vitals:  Vitals Value Taken Time  BP    Temp    Pulse    Resp    SpO2      Last Pain:  Vitals:   03/01/18 1228  TempSrc:   PainSc: 0-No pain         Complications: No apparent anesthesia complications

## 2018-03-01 NOTE — Brief Op Note (Signed)
03/01/2018  11:10 AM  PATIENT:  Vincent Stevens  72 y.o. male  PRE-OPERATIVE DIAGNOSIS:  CAD LMD  POST-OPERATIVE DIAGNOSIS:  CAD LMD  PROCEDURE:  Procedure(s): CORONARY ARTERY BYPASS GRAFTING (CABG) times three using the right saphaneous vein. Harvested endoscopicly; and left internal mammary artery. (N/A) TRANSESOPHAGEAL ECHOCARDIOGRAM (TEE) (N/A)  SURGEON:  Surgeon(s) and Role:    * Bartle, Fernande Boyden, MD - Primary  PHYSICIAN ASSISTANT: Jaedyn Lard PA-C  ANESTHESIA:   general  EBL:  1300 mL   BLOOD ADMINISTERED:none  DRAINS: PLEURAL AND PERICARDIAL CHEST DRAINS   LOCAL MEDICATIONS USED:  NONE  SPECIMEN:  No Specimen  DISPOSITION OF SPECIMEN:  N/A  COUNTS:  YES  TOURNIQUET:  * No tourniquets in log *  DICTATION: .Dragon Dictation  PLAN OF CARE: Admit to inpatient   PATIENT DISPOSITION:  ICU - intubated and hemodynamically stable.   Delay start of Pharmacological VTE agent (>24hrs) due to surgical blood loss or risk of bleeding: yes

## 2018-03-01 NOTE — Anesthesia Procedure Notes (Addendum)
Central Venous Catheter Insertion Performed by: Murvin Natal, MD, anesthesiologist Start/End4/01/2018 1:45 PM, 03/01/2018 2:00 PM Patient location: OR. Emergency situation Preanesthetic checklist: patient identified, IV checked, site marked, risks and benefits discussed, surgical consent, monitors and equipment checked, pre-op evaluation, timeout performed and anesthesia consent Position: Trendelenburg Hand hygiene performed , maximum sterile barriers used  and Seldinger technique used Catheter size: 9 Fr Total catheter length 12. PA cath was placed.MAC introducer Swan type:thermodilution PA Cath depth:48 Procedure performed using ultrasound guided technique. Attempts: 1 Following insertion, line sutured, dressing applied and Biopatch. Post procedure assessment: no air, free fluid flow and blood return through all ports  Patient tolerated the procedure well with no immediate complications.

## 2018-03-01 NOTE — Consult Note (Addendum)
[address]       [place] [address]             [phone]      Cardiothoracic Surgery Consultation  Reason for Consult: Subtotal left main coronary stenosis Referring Physician: Dr. Glenetta Hew Primary Cardiologist: Dr. Shirlee More  Vincent Stevens is an 72 y.o. male.  HPI:   The patient is a 73 year old gentleman with a history of hypertension and hyperlipidemia who was evaluated by Dr. Shirlee More for exertional shortness of breath and chest discomfort.  The patient said that he had an episode of bronchitis in December and since then has developed substernal chest discomfort radiating into both shoulders with exertion such as walking up hills or using his upper extremities for physical activity.  This been associated with shortness of breath.  His symptoms have been relieved with rest.  His episodes are occurring every day to every other day.  He underwent cardiac catheterization this morning which showed a heavily calcified distal left main bifurcation stenosis which is at least 99% involving the ostium of the LAD and left circumflex.  The LAD also has about 90% stenosis in the proximal to midportion with slow flow down the LAD.  The right coronary artery is a dominant vessel with high-grade disease in the proximal posterior descending branch.  Left ventricular systolic function appears normal.  LVEDP was 11.  The patient remained hemodynamically stable in the Cath Lab and had an intra-aortic balloon pump placed for anatomy.  Past Medical History:  Diagnosis Date  . Allergy   . Benign prostatic hypertrophy   . BENIGN PROSTATIC HYPERTROPHY 09/19/2009   Qualifier: Diagnosis of  By: Burnice Logan  MD, Doretha Sou   . Chest pain 02/23/2018  . Dyslipidemia 08/14/2010   Qualifier: Diagnosis of  By: Burnice Logan  MD, Doretha Sou   . Essential hypertension 01/17/2009   Qualifier: Diagnosis of  By: Burnice Logan  MD, Doretha Sou   . Gout   . GOUT, ACUTE 11/07/2009   Qualifier:  Diagnosis of  By: Burnice Logan  MD, Doretha Sou   . History of colonic polyps 09/27/2008   Qualifier: Diagnosis of  By: Burnice Logan  MD, Doretha Sou  Initial colonoscopy 2003.  Hyperplastic polyps only Followup colonoscopy 2008.  Normal  10 year interval recommended   . Hyperlipidemia   . Hypertension   . Impaired glucose tolerance 02/20/2013  . Left main coronary artery disease   . SOB (shortness of breath) 02/23/2018    Past Surgical History:  Procedure Laterality Date  . HERNIA REPAIR      Family History  Problem Relation Age of Onset  . Diabetes Unknown   . Hypertension Unknown   . Heart attack Father   . Dementia Mother   . Cancer Sister     Social History:  reports that he quit smoking about 37 years ago. He has never used smokeless tobacco. He reports that he does not drink alcohol or use drugs.  Allergies:  Allergies  Allergen Reactions  . Sulfamethoxazole Hives and Itching    Medications:  I have reviewed the patient's current medications. Prior to Admission:  Medications Prior to Admission  Medication Sig Dispense Refill Last Dose  . amLODipine (NORVASC) 5 MG tablet Take 1 tablet (5 mg total) by mouth daily. 90 tablet 3 02/28/2018 at Unknown time  . aspirin EC 81 MG tablet Take 81 mg by mouth daily.   03/01/2018 at 0700  . ibuprofen (ADVIL,MOTRIN) 200  MG tablet Take 2 tablets (400 mg total) by mouth every 6 (six) hours as needed. (Patient taking differently: Take 400 mg by mouth every 6 (six) hours as needed for headache or moderate pain. ) 90 tablet 1 Past Week at Unknown time  . Multiple Vitamin (MULTIVITAMIN) tablet Take 1 tablet by mouth daily. 90 tablet 3 Past Week at Unknown time  . simvastatin (ZOCOR) 20 MG tablet Take 1 tablet (20 mg total) by mouth at bedtime. 90 tablet 3 02/28/2018 at Unknown time  . tamsulosin (FLOMAX) 0.4 MG CAPS capsule Take 1 capsule (0.4 mg total) by mouth daily. 90 capsule 3 02/28/2018 at Unknown time   Scheduled: . aspirin  81 mg Oral Pre-Cath  .  magnesium sulfate  40 mEq Other To OR  . potassium chloride  80 mEq Other To OR  . sodium chloride flush  3 mL Intravenous Q12H  . tranexamic acid  15 mg/kg Intravenous To OR  . tranexamic acid  2 mg/kg Intracatheter To OR   Continuous: . sodium chloride    . sodium chloride 1 mL/kg/hr (03/01/18 1124)  . cefUROXime (ZINACEF)  IV    . cefUROXime (ZINACEF)  IV    . dexmedetomidine    . DOPamine    . epinephrine    . heparin 30,000 units/NS 1000 mL solution for CELLSAVER    . insulin (NOVOLIN-R) infusion    . milrinone    . nitroGLYCERIN    . phenylephrine 20mg /265mL NS (0.08mg /ml) infusion    . tranexamic acid (CYKLOKAPRON) infusion ([facility])    . vancomycin     CNO:BSJGGE chloride, 0.9 % irrigation (POUR BTL), hemostatic agents, sodium chloride flush, Surgifoam 1 Gm with Thrombin 20,000 units (20 ml) topical solution, thrombin  No results found for this or any previous visit (from the past 48 hour(s)).  No results found.  Review of Systems  Constitutional: Positive for malaise/fatigue.  HENT: Negative.   Eyes: Negative.   Respiratory: Positive for shortness of breath.   Cardiovascular: Positive for chest pain. Negative for leg swelling.  Gastrointestinal: Negative.   Genitourinary: Negative.   Musculoskeletal: Negative.   Skin: Negative.   Neurological: Negative.   Endo/Heme/Allergies: Negative.   Psychiatric/Behavioral: Negative.    Blood pressure (!) 155/71, pulse 62, temperature (!) 97.5 F (36.4 C), temperature source Oral, resp. rate 13, height 5\' 8"  (1.727 m), weight 93.9 kg (207 lb), SpO2 98 %. Physical Exam  Constitutional: He is oriented to person, place, and time. He appears well-developed and well-nourished. No distress.  Cardiovascular: Normal rate, regular rhythm, normal heart sounds and intact distal pulses.  No murmur heard. Respiratory: Effort normal and breath sounds normal. No respiratory distress.  Musculoskeletal: He exhibits no edema.  Neurological:  He is alert and oriented to person, place, and time.  Skin: Skin is warm and dry.  Psychiatric: He has a normal mood and affect.    Assessment/Plan:  This 72 year old gentleman has a severely calcified distal left main coronary bifurcation stenosis which is at least 99% with a 90% proximal to mid LAD stenosis and slow flow down the LAD.  There is also high-grade stenosis in the proximal portion of the posterior descending branch.  Left ventricular function is preserved.  I agreed the best option for this patient is to proceed with emergent coronary bypass graft surgery. I discussed the operative procedure with the patient and family including alternatives, benefits and risks; including but not limited to bleeding, blood transfusion, infection, stroke, myocardial infarction, graft failure, heart  block requiring a permanent pacemaker, organ dysfunction, and death.  Vincent Stevens understands and agrees to proceed.   The operating room has been notified and will plan to proceed with emergent coronary bypass graft surgery as soon as possible.  I spent 40 minutes performing this consultation and > 50% of this time was spent face to face counseling and coordinating the care of this patient's high-grade left main coronary stenosis.    Fernande Boyden Ricci Paff 03/01/2018, 1:10 PM

## 2018-03-01 NOTE — Op Note (Signed)
CARDIOVASCULAR SURGERY OPERATIVE NOTE  03/01/2018  Surgeon:  Gaye Pollack, MD  First Assistant: Jadene Pierini,  PA-C   Preoperative Diagnosis:  Severe left main and multi-vessel coronary artery disease   Postoperative Diagnosis:  Same   Procedure:  1. Median Sternotomy 2. Extracorporeal circulation 3.   Emergent Coronary artery bypass grafting x 3   Left internal mammary graft to the LAD  SVG to OM  SVG to PDA  4.   Endoscopic vein harvest from the right leg   Anesthesia:  General Endotracheal   Clinical History/Surgical Indication:  The patient is a 72 year old gentleman with a history of hypertension and hyperlipidemia who was evaluated by Dr. Shirlee More for exertional shortness of breath and chest discomfort.  The patient said that he had an episode of bronchitis in December and since then has developed substernal chest discomfort radiating into both shoulders with exertion such as walking up hills or using his upper extremities for physical activity.  This been associated with shortness of breath.  His symptoms have been relieved with rest.  His episodes are occurring every day to every other day.  He underwent cardiac catheterization this morning which showed a heavily calcified distal left main bifurcation stenosis which is at least 99% involving the ostium of the LAD and left circumflex.  The LAD also has about 90% stenosis in the proximal to midportion with slow flow down the LAD.  The right coronary artery is a dominant vessel with high-grade disease in the proximal posterior descending branch.  Left ventricular systolic function appears normal.  LVEDP was 11.  The patient remained hemodynamically stable in the Cath Lab and had an intra-aortic balloon pump placed for anatomy.   I agree that the best option for this patient is to proceed with emergent coronary bypass graft  surgery. I discussed the operative procedure with the patient and family including alternatives, benefits and risks; including but not limited to bleeding, blood transfusion, infection, stroke, myocardial infarction, graft failure, heart block requiring a permanent pacemaker, organ dysfunction, and death.  Vincent Stevens understands and agrees to proceed.      Preparation:  The patient was seen in the preoperative holding area and the correct patient, correct operation were confirmed with the patient after reviewing the medical record and catheterization. The consent was signed by me. Preoperative antibiotics were given. A pulmonary arterial line and radial arterial line were placed by the anesthesia team. The patient was taken back to the operating room and positioned supine on the operating room table. After being placed under general endotracheal anesthesia by the anesthesia team a foley catheter was placed. The neck, chest, abdomen, and both legs were prepped with betadine soap and solution and draped in the usual sterile manner. A surgical time-out was taken and the correct patient and operative procedure were confirmed with the nursing and anesthesia staff.   Cardiopulmonary Bypass:  A median sternotomy was performed. The pericardium was opened in the midline. Right ventricular function appeared normal. The ascending aorta was of normal size and had no palpable plaque. There were no contraindications to aortic cannulation or cross-clamping. The patient was fully systemically heparinized and the ACT was maintained > 400 sec. The proximal aortic arch was cannulated with a 20 F aortic cannula for arterial inflow. Venous cannulation was performed via the right atrial appendage using a two-staged venous cannula. An antegrade cardioplegia/vent cannula was inserted into the mid-ascending aorta. A retrograde cardioplegia cannula was placed into the coronary sinus via  the right atrium.  Aortic occlusion  was performed with a single cross-clamp. Systemic cooling to 32 degrees Centigrade and topical cooling of the heart with iced saline were used. Hyperkalemic antegrade cold blood cardioplegia was used to induce diastolic arrest and was then given at about 20 minute intervals throughout the period of arrest to maintain myocardial temperature at or below 10 degrees centigrade. A temperature probe was inserted into the interventricular septum and an insulating pad was placed in the pericardium.   Left internal mammary harvest:  The left side of the sternum was retracted using the Rultract retractor. The left internal mammary artery was harvested as a pedicle graft. All side branches were clipped. It was a medium-sized vessel of good quality with excellent blood flow. It was ligated distally and divided. It was sprayed with topical papaverine solution to prevent vasospasm.   Endoscopic vein harvest:  The right greater saphenous vein was harvested endoscopically through a 2 cm incision medial to the right knee. It was harvested from the upper thigh to below the knee. It was a medium-sized vein of good quality. The side branches were all ligated with 4-0 silk ties.    Coronary arteries:  The coronary arteries were examined.   LAD:  Intramyocardial throughout most of its extent. It was located in the mid-portion where it was a large graftable vessel with no disease.  LCX:  Single large OM with segmental distal bifurcation disease.  RCA:  Large PDA with proximal disease but no distal disease.   Grafts:  1. LIMA to the LAD: 2.5 mm. It was sewn end to side using 8-0 prolene continuous suture. 2. SVG to OM:  1.75 mm distally. It was sewn end to side using 7-0 prolene continuous suture. 3. SVG to PDA:  1.75 mm. It was sewn end to side using 7-0 prolene continuous suture.   The proximal vein graft anastomoses were performed to the mid-ascending aorta using continuous 6-0 prolene suture. Graft  markers were placed around the proximal anastomoses.   Completion:  The patient was rewarmed to 37 degrees Centigrade. The clamp was removed from the LIMA pedicle and there was rapid warming of the septum and return of ventricular fibrillation. The crossclamp was removed with a time of 76 minutes. There was spontaneous return of sinus rhythm. The distal and proximal anastomoses were checked for hemostasis. The position of the grafts was satisfactory. Two temporary epicardial pacing wires were placed on the right atrium and two on the right ventricle. The patient was weaned from CPB without difficulty on no inotropes. The IABP was placed on 1:2 once of bypass. CPB time was 97 minutes. Cardiac output was 5 LPM. TEE showed normal LV systolic function. Heparin was fully reversed with protamine and the aortic and venous cannulas removed. Hemostasis was achieved. Mediastinal and left pleural drainage tubes were placed. The sternum was closed with double #6 stainless steel wires. The fascia was closed with continuous # 1 vicryl suture. The subcutaneous tissue was closed with 2-0 vicryl continuous suture. The skin was closed with 3-0 vicryl subcuticular suture. All sponge, needle, and instrument counts were reported correct at the end of the case. Dry sterile dressings were placed over the incisions and around the chest tubes which were connected to pleurevac suction. The patient was then transported to the surgical intensive care unit in stable condition.

## 2018-03-01 NOTE — Interval H&P Note (Signed)
History and Physical Interval Note:  03/01/2018 11:45 AM  Vincent Stevens  has presented today for surgery, with the diagnosis of angina (class III).    The various methods of treatment have been discussed with the patient and family. After consideration of risks, benefits and other options for treatment, the patient has consented to  Procedure(s): LEFT HEART CATH AND CORONARY ANGIOGRAPHY (N/A) with possible PERCUTANEOUS CORONARY INTERVENTION as a surgical intervention .  The patient's history has been reviewed, patient examined, no change in status, stable for surgery.  I have reviewed the patient's chart and labs.  Questions were answered to the patient's satisfaction.     Cath Lab Visit (complete for each Cath Lab visit)  Clinical Evaluation Leading to the Procedure:   ACS: No.  Non-ACS:    Anginal Classification: CCS III  Anti-ischemic medical therapy: Maximal Therapy (2 or more classes of medications)  Non-Invasive Test Results: No non-invasive testing performed  Prior CABG: No previous CABG   Glenetta Hew

## 2018-03-02 ENCOUNTER — Encounter (HOSPITAL_COMMUNITY): Payer: Self-pay | Admitting: Surgery

## 2018-03-02 ENCOUNTER — Inpatient Hospital Stay (HOSPITAL_COMMUNITY): Payer: Medicare HMO

## 2018-03-02 DIAGNOSIS — Z951 Presence of aortocoronary bypass graft: Secondary | ICD-10-CM

## 2018-03-02 DIAGNOSIS — I2510 Atherosclerotic heart disease of native coronary artery without angina pectoris: Secondary | ICD-10-CM

## 2018-03-02 LAB — GLUCOSE, CAPILLARY
GLUCOSE-CAPILLARY: 105 mg/dL — AB (ref 65–99)
GLUCOSE-CAPILLARY: 114 mg/dL — AB (ref 65–99)
GLUCOSE-CAPILLARY: 124 mg/dL — AB (ref 65–99)
GLUCOSE-CAPILLARY: 124 mg/dL — AB (ref 65–99)
Glucose-Capillary: 140 mg/dL — ABNORMAL HIGH (ref 65–99)
Glucose-Capillary: 140 mg/dL — ABNORMAL HIGH (ref 65–99)
Glucose-Capillary: 160 mg/dL — ABNORMAL HIGH (ref 65–99)
Glucose-Capillary: 117 mg/dL — ABNORMAL HIGH (ref 65–99)
Glucose-Capillary: 104 mg/dL — ABNORMAL HIGH (ref 65–99)
Glucose-Capillary: 132 mg/dL — ABNORMAL HIGH (ref 65–99)
Glucose-Capillary: 143 mg/dL — ABNORMAL HIGH (ref 65–99)

## 2018-03-02 LAB — CBC
HCT: 42.8 % (ref 39.0–52.0)
HEMATOCRIT: 41.7 % (ref 39.0–52.0)
Hemoglobin: 14.2 g/dL (ref 13.0–17.0)
Hemoglobin: 13.8 g/dL (ref 13.0–17.0)
MCH: 28.7 pg (ref 26.0–34.0)
MCH: 28.9 pg (ref 26.0–34.0)
MCHC: 33.2 g/dL (ref 30.0–36.0)
MCHC: 33.1 g/dL (ref 30.0–36.0)
MCV: 86.6 fL (ref 78.0–100.0)
MCV: 87.4 fL (ref 78.0–100.0)
Platelets: 161 10*3/uL (ref 150–400)
Platelets: 158 10*3/uL (ref 150–400)
RBC: 4.94 MIL/uL (ref 4.22–5.81)
RBC: 4.77 MIL/uL (ref 4.22–5.81)
RDW: 13.7 % (ref 11.5–15.5)
RDW: 13.9 % (ref 11.5–15.5)
WBC: 14.9 10*3/uL — AB (ref 4.0–10.5)
WBC: 14.8 10*3/uL — ABNORMAL HIGH (ref 4.0–10.5)

## 2018-03-02 LAB — POCT I-STAT 3, ART BLOOD GAS (G3+)
ACID-BASE DEFICIT: 1 mmol/L (ref 0.0–2.0)
ACID-BASE DEFICIT: 4 mmol/L — AB (ref 0.0–2.0)
ACID-BASE DEFICIT: 4 mmol/L — AB (ref 0.0–2.0)
ACID-BASE DEFICIT: 4 mmol/L — AB (ref 0.0–2.0)
Acid-base deficit: 1 mmol/L (ref 0.0–2.0)
Acid-base deficit: 5 mmol/L — ABNORMAL HIGH (ref 0.0–2.0)
BICARBONATE: 21 mmol/L (ref 20.0–28.0)
Bicarbonate: 24.7 mmol/L (ref 20.0–28.0)
Bicarbonate: 22.6 mmol/L (ref 20.0–28.0)
Bicarbonate: 21.5 mmol/L (ref 20.0–28.0)
Bicarbonate: 20.7 mmol/L (ref 20.0–28.0)
Bicarbonate: 21.6 mmol/L (ref 20.0–28.0)
O2 SAT: 100 %
O2 SAT: 99 %
O2 SAT: 99 %
O2 SAT: 99 %
O2 SAT: 98 %
O2 Saturation: 99 %
PCO2 ART: 34.8 mmHg (ref 32.0–48.0)
PH ART: 7.339 — AB (ref 7.350–7.450)
PH ART: 7.42 (ref 7.350–7.450)
PO2 ART: 136 mmHg — AB (ref 83.0–108.0)
PO2 ART: 116 mmHg — AB (ref 83.0–108.0)
Patient temperature: 36.4
Patient temperature: 37.3
TCO2: 26 mmol/L (ref 22–32)
TCO2: 24 mmol/L (ref 22–32)
TCO2: 23 mmol/L (ref 22–32)
TCO2: 22 mmol/L (ref 22–32)
TCO2: 22 mmol/L (ref 22–32)
TCO2: 23 mmol/L (ref 22–32)
pCO2 arterial: 45.9 mmHg (ref 32.0–48.0)
pCO2 arterial: 43.5 mmHg (ref 32.0–48.0)
pCO2 arterial: 37.2 mmHg (ref 32.0–48.0)
pCO2 arterial: 39.4 mmHg (ref 32.0–48.0)
pCO2 arterial: 40.5 mmHg (ref 32.0–48.0)
pH, Arterial: 7.298 — ABNORMAL LOW (ref 7.350–7.450)
pH, Arterial: 7.352 (ref 7.350–7.450)
pH, Arterial: 7.336 — ABNORMAL LOW (ref 7.350–7.450)
pH, Arterial: 7.336 — ABNORMAL LOW (ref 7.350–7.450)
pO2, Arterial: 442 mmHg — ABNORMAL HIGH (ref 83.0–108.0)
pO2, Arterial: 153 mmHg — ABNORMAL HIGH (ref 83.0–108.0)
pO2, Arterial: 125 mmHg — ABNORMAL HIGH (ref 83.0–108.0)
pO2, Arterial: 144 mmHg — ABNORMAL HIGH (ref 83.0–108.0)

## 2018-03-02 LAB — POCT I-STAT, CHEM 8
BUN: 11 mg/dL (ref 6–20)
BUN: 11 mg/dL (ref 6–20)
BUN: 9 mg/dL (ref 6–20)
BUN: 10 mg/dL (ref 6–20)
BUN: 9 mg/dL (ref 6–20)
BUN: 15 mg/dL (ref 6–20)
CALCIUM ION: 0.97 mmol/L — AB (ref 1.15–1.40)
CALCIUM ION: 0.96 mmol/L — AB (ref 1.15–1.40)
CALCIUM ION: 1 mmol/L — AB (ref 1.15–1.40)
CALCIUM ION: 1.09 mmol/L — AB (ref 1.15–1.40)
CHLORIDE: 104 mmol/L (ref 101–111)
CHLORIDE: 102 mmol/L (ref 101–111)
CREATININE: 0.8 mg/dL (ref 0.61–1.24)
CREATININE: 0.6 mg/dL — AB (ref 0.61–1.24)
CREATININE: 0.8 mg/dL (ref 0.61–1.24)
CREATININE: 0.6 mg/dL — AB (ref 0.61–1.24)
Calcium, Ion: 1.17 mmol/L (ref 1.15–1.40)
Calcium, Ion: 1.13 mmol/L — ABNORMAL LOW (ref 1.15–1.40)
Chloride: 104 mmol/L (ref 101–111)
Chloride: 102 mmol/L (ref 101–111)
Chloride: 102 mmol/L (ref 101–111)
Chloride: 102 mmol/L (ref 101–111)
Creatinine, Ser: 0.7 mg/dL (ref 0.61–1.24)
Creatinine, Ser: 0.9 mg/dL (ref 0.61–1.24)
GLUCOSE: 119 mg/dL — AB (ref 65–99)
GLUCOSE: 133 mg/dL — AB (ref 65–99)
GLUCOSE: 126 mg/dL — AB (ref 65–99)
GLUCOSE: 127 mg/dL — AB (ref 65–99)
Glucose, Bld: 113 mg/dL — ABNORMAL HIGH (ref 65–99)
Glucose, Bld: 148 mg/dL — ABNORMAL HIGH (ref 65–99)
HCT: 46 % (ref 39.0–52.0)
HCT: 32 % — ABNORMAL LOW (ref 39.0–52.0)
HCT: 42 % (ref 39.0–52.0)
HCT: 33 % — ABNORMAL LOW (ref 39.0–52.0)
HCT: 33 % — ABNORMAL LOW (ref 39.0–52.0)
HEMATOCRIT: 42 % (ref 39.0–52.0)
HEMOGLOBIN: 14.3 g/dL (ref 13.0–17.0)
HEMOGLOBIN: 11.2 g/dL — AB (ref 13.0–17.0)
HEMOGLOBIN: 14.3 g/dL (ref 13.0–17.0)
Hemoglobin: 15.6 g/dL (ref 13.0–17.0)
Hemoglobin: 10.9 g/dL — ABNORMAL LOW (ref 13.0–17.0)
Hemoglobin: 11.2 g/dL — ABNORMAL LOW (ref 13.0–17.0)
POTASSIUM: 3.4 mmol/L — AB (ref 3.5–5.1)
Potassium: 3.5 mmol/L (ref 3.5–5.1)
Potassium: 3.7 mmol/L (ref 3.5–5.1)
Potassium: 4.1 mmol/L (ref 3.5–5.1)
Potassium: 4.5 mmol/L (ref 3.5–5.1)
Potassium: 4.3 mmol/L (ref 3.5–5.1)
SODIUM: 141 mmol/L (ref 135–145)
SODIUM: 138 mmol/L (ref 135–145)
Sodium: 142 mmol/L (ref 135–145)
Sodium: 142 mmol/L (ref 135–145)
Sodium: 143 mmol/L (ref 135–145)
Sodium: 138 mmol/L (ref 135–145)
TCO2: 24 mmol/L (ref 22–32)
TCO2: 25 mmol/L (ref 22–32)
TCO2: 28 mmol/L (ref 22–32)
TCO2: 24 mmol/L (ref 22–32)
TCO2: 26 mmol/L (ref 22–32)
TCO2: 23 mmol/L (ref 22–32)

## 2018-03-02 LAB — BASIC METABOLIC PANEL
ANION GAP: 7 (ref 5–15)
BUN: 10 mg/dL (ref 6–20)
CALCIUM: 7.6 mg/dL — AB (ref 8.9–10.3)
CO2: 21 mmol/L — ABNORMAL LOW (ref 22–32)
Chloride: 108 mmol/L (ref 101–111)
Creatinine, Ser: 0.94 mg/dL (ref 0.61–1.24)
GFR calc Af Amer: >60 mL/min (ref >60)
GFR calc non Af Amer: >60 mL/min (ref >60)
Glucose, Bld: 138 mg/dL — ABNORMAL HIGH (ref 65–99)
Potassium: 4.3 mmol/L (ref 3.5–5.1)
Sodium: 136 mmol/L (ref 135–145)

## 2018-03-02 LAB — MAGNESIUM
MAGNESIUM: 2.6 mg/dL — AB (ref 1.7–2.4)
Magnesium: 3 mg/dL — ABNORMAL HIGH (ref 1.7–2.4)

## 2018-03-02 LAB — CREATININE, SERUM
Creatinine, Ser: 0.99 mg/dL (ref 0.61–1.24)
GFR calc Af Amer: >60 mL/min (ref >60)

## 2018-03-02 MED ORDER — INSULIN ASPART 100 UNIT/ML ~~LOC~~ SOLN
0.0000 [IU] | SUBCUTANEOUS | Status: DC
  Administered 2018-03-02 – 2018-03-03 (×4): 2 [IU] via SUBCUTANEOUS

## 2018-03-02 MED ORDER — INSULIN DETEMIR 100 UNIT/ML ~~LOC~~ SOLN
20.0000 [IU] | Freq: Every day | SUBCUTANEOUS | Status: DC
  Administered 2018-03-02: 20 [IU] via SUBCUTANEOUS
  Filled 2018-03-02 (×3): qty 0.2

## 2018-03-02 MED FILL — Thrombin For Soln 20000 Unit: CUTANEOUS | Qty: 1 | Status: AC

## 2018-03-02 NOTE — Care Management Note (Signed)
Case Management Note Marvetta Gibbons RN, BSN Unit 4E-Case Manager- [facility] coverage [phone]  Patient Details  Name: Vincent Stevens MRN: [id number] Date of Birth: 12/22/1945  Subjective/Objective:  Pt admitted s/p CABGx3                  Action/Plan: PTA pt lived at home with spouse- CM to follow for transition of care needs  Expected Discharge Date:                  Expected Discharge Plan:  Home/Self Care  In-House Referral:     Discharge planning Services  CM Consult  Post Acute Care Choice:    Choice offered to:     DME Arranged:    DME Agency:     HH Arranged:    HH Agency:     Status of Service:  In process, will continue to follow  If discussed at Long Length of Stay Meetings, dates discussed:    Discharge Disposition:   Additional Comments:  Dawayne Patricia, RN 03/02/2018, 3:32 PM

## 2018-03-02 NOTE — Progress Notes (Signed)
1 Day Post-Op Procedure(s) (LRB): CORONARY ARTERY BYPASS GRAFTING (CABG) times three using the right saphaneous vein. Harvested endoscopicly; and left internal mammary artery. (N/A) TRANSESOPHAGEAL ECHOCARDIOGRAM (TEE) (N/A) Subjective: Sore and wants to sit up  Objective: Vital signs in last 24 hours: Temp:  [96.8 F (36 C)-99.1 F (37.3 C)] 99 F (37.2 C) (04/04 0700) Pulse Rate:  [28-89] 88 (04/04 0700) Cardiac Rhythm: Atrial paced (04/04 0500) Resp:  [0-27] 27 (04/04 0700) BP: (83-155)/(54-91) 109/67 (04/04 0700) SpO2:  [97 %-100 %] 100 % (04/04 0700) Arterial Line BP: (69-158)/(50-95) 76/72 (04/04 0700) FiO2 (%):  [40 %-50 %] 40 % (04/04 0325) Weight:  [93.9 kg (207 lb)-94.3 kg (208 lb)] 94.3 kg (208 lb) (04/04 0500)  Hemodynamic parameters for last 24 hours: PAP: (20-34)/(9-16) 28/11 CO:  [3 L/min-4.3 L/min] 4.3 L/min CI:  [1.4 L/min/m2-2.1 L/min/m2] 2 L/min/m2  Intake/Output from previous day: 04/03 0701 - 04/04 0700 In: 4383.9 [I.V.:2880.9; Blood:723; NG/GT:30; IV Piggyback:750] Out: 9518 [Urine:2695; Blood:1300; Chest Tube:300] Intake/Output this shift: No intake/output data recorded.  General appearance: alert and cooperative Neurologic: intact Heart: regular rate and rhythm, S1, S2 normal, no murmur, click, rub or gallop Lungs: clear to auscultation bilaterally Extremities: edema mild, warm and well perfused. Wound: dressings dry  Lab Results: Recent Labs    03/01/18 1839 03/01/18 1847 03/02/18 0344  WBC 14.3*  --  14.9*  HGB 14.6 14.3 14.2  HCT 43.9 42.0 42.8  PLT 159  --  161   BMET:  Recent Labs    03/01/18 1847 03/02/18 0344  NA 141 136  K 4.2 4.3  CL  --  108  CO2  --  21*  GLUCOSE 132* 138*  BUN  --  10  CREATININE  --  0.94  CALCIUM  --  7.6*    PT/INR:  Recent Labs    03/01/18 1839  LABPROT 15.5*  INR 1.24   ABG    Component Value Date/Time   PHART 7.336 (L) 03/02/2018 0533   HCO3 21.6 03/02/2018 0533   TCO2 23 03/02/2018  0533   ACIDBASEDEF 4.0 (H) 03/02/2018 0533   O2SAT 98.0 03/02/2018 0533   CBG (last 3)  Recent Labs    03/02/18 0123 03/02/18 0326 03/02/18 0528  GLUCAP 160* 140* 124*   CXR: ok  ECG: sinus brady 45. No acute changes  Assessment/Plan: S/P Procedure(s) (LRB): CORONARY ARTERY BYPASS GRAFTING (CABG) times three using the right saphaneous vein. Harvested endoscopicly; and left internal mammary artery. (N/A) TRANSESOPHAGEAL ECHOCARDIOGRAM (TEE) (N/A)  POD 1  Hemodynamically stable. Atrial pacing due to underlying sinus brady 40-50. His preop ECG showed sinus brady 50 so will hold off on beta blocker. DC IABP, right radial arterial sheath.  DC chest tubes later today after dangle.  DM: glucose under good control but still on insulin drip. No hx of DM but Hgb A1c 05/2017 was 6.2. Will need dietary modification and follow up. Give a dose of Levemir this am to get off insulin drip and start SSI.       LOS: 1 day    Gaye Pollack 03/02/2018

## 2018-03-02 NOTE — Procedures (Signed)
Extubation Procedure Note  Patient Details:   Name: Vincent Stevens DOB: 08-05-1946 MRN: [id number]   Airway Documentation:  Airway 8 mm (Active)  Secured at (cm) 22 cm 03/02/2018  3:25 AM  Measured From Lips 03/02/2018  3:25 AM  Secured Location Right 03/02/2018  3:25 AM  Secured By Pink Tape 03/02/2018  3:25 AM  Cuff Pressure (cm H2O) 30 cm H2O 03/01/2018  7:40 PM  Site Condition Dry 03/02/2018  3:25 AM    Evaluation  O2 sats: stable throughout Complications: No apparent complications Patient did tolerate procedure well. Bilateral Breath Sounds: Clear, Diminished   Yes  Charyl Dancer 03/02/2018, 4:03 AM    Pt extubated per rapid wean protocol. Pt performed NIF -20 VC 1.2L. Pt had positive cuff leak. Pt was placed on a 4 L [place] and is doing well at this time.

## 2018-03-02 NOTE — Progress Notes (Signed)
Removal of Right Femoral IABP Balloon Pump without complications. Manual compression applied for 30 minutes. Dital Pulses palpable PT +1, DP+1. Removal of Zephyr Band to Right radial artery without complication. Band with 13cc at 733a,. Site stable, with no hematoma, bruising or oozing noted. Refill 3 seconds. Right femoral site stable upon departure with sterile 4x4 applied to insertion site with Tegaderm to secure. BP: 101/59, HR: 61, SP02: 97%. Patient given instructions and was verbal able to repeat.

## 2018-03-02 NOTE — Progress Notes (Signed)
TCTS BRIEF SICU PROGRESS NOTE  1 Day Post-Op  S/P Procedure(s) (LRB): CORONARY ARTERY BYPASS GRAFTING (CABG) times three using the right saphaneous vein. Harvested endoscopicly; and left internal mammary artery. (N/A) TRANSESOPHAGEAL ECHOCARDIOGRAM (TEE) (N/A)   Stable day AAI paced w/ stable BP Breathing comfortably w/ O2 sats  96% on room air UOP adequate  Plan: Continue current plan  Rexene Alberts, MD 03/02/2018 7:55 PM

## 2018-03-03 ENCOUNTER — Inpatient Hospital Stay (HOSPITAL_COMMUNITY): Payer: Medicare HMO

## 2018-03-03 LAB — CBC
HCT: 36.8 % — ABNORMAL LOW (ref 39.0–52.0)
Hemoglobin: 12.2 g/dL — ABNORMAL LOW (ref 13.0–17.0)
MCH: 29.3 pg (ref 26.0–34.0)
MCHC: 33.2 g/dL (ref 30.0–36.0)
MCV: 88.5 fL (ref 78.0–100.0)
PLATELETS: 131 10*3/uL — AB (ref 150–400)
RBC: 4.16 MIL/uL — AB (ref 4.22–5.81)
RDW: 14.5 % (ref 11.5–15.5)
WBC: 13.5 10*3/uL — AB (ref 4.0–10.5)

## 2018-03-03 LAB — BASIC METABOLIC PANEL
Anion gap: 9 (ref 5–15)
BUN: 14 mg/dL (ref 6–20)
CALCIUM: 7.6 mg/dL — AB (ref 8.9–10.3)
CO2: 24 mmol/L (ref 22–32)
CREATININE: 0.85 mg/dL (ref 0.61–1.24)
Chloride: 100 mmol/L — ABNORMAL LOW (ref 101–111)
GFR calc Af Amer: >60 mL/min (ref >60)
GFR calc non Af Amer: >60 mL/min (ref >60)
Glucose, Bld: 116 mg/dL — ABNORMAL HIGH (ref 65–99)
Potassium: 4.1 mmol/L (ref 3.5–5.1)
SODIUM: 133 mmol/L — AB (ref 135–145)

## 2018-03-03 LAB — GLUCOSE, CAPILLARY
GLUCOSE-CAPILLARY: 124 mg/dL — AB (ref 65–99)
GLUCOSE-CAPILLARY: 98 mg/dL (ref 65–99)
GLUCOSE-CAPILLARY: 100 mg/dL — AB (ref 65–99)
Glucose-Capillary: 130 mg/dL — ABNORMAL HIGH (ref 65–99)
Glucose-Capillary: 104 mg/dL — ABNORMAL HIGH (ref 65–99)
Glucose-Capillary: 84 mg/dL (ref 65–99)
Glucose-Capillary: 99 mg/dL (ref 65–99)

## 2018-03-03 MED ORDER — MIDODRINE HCL 5 MG PO TABS
10.0000 mg | ORAL_TABLET | Freq: Three times a day (TID) | ORAL | Status: DC
  Administered 2018-03-03 – 2018-03-05 (×9): 10 mg via ORAL
  Filled 2018-03-03 (×9): qty 2

## 2018-03-03 MED ORDER — SODIUM CHLORIDE 0.9 % IV SOLN
1.0000 g | Freq: Once | INTRAVENOUS | Status: AC
  Administered 2018-03-03: 1 g via INTRAVENOUS
  Filled 2018-03-03: qty 10

## 2018-03-03 MED ORDER — INSULIN ASPART 100 UNIT/ML ~~LOC~~ SOLN: 0.0000 [IU] | Freq: Three times a day (TID) | SUBCUTANEOUS | Status: DC

## 2018-03-03 MED FILL — Heparin Sodium (Porcine) Inj 1000 Unit/ML: INTRAMUSCULAR | Qty: 2500 | Status: AC

## 2018-03-03 MED FILL — Dexmedetomidine HCl in NaCl 0.9% IV Soln 400 MCG/100ML: INTRAVENOUS | Qty: 100 | Status: AC

## 2018-03-03 MED FILL — Magnesium Sulfate Inj 50%: INTRAMUSCULAR | Qty: 10 | Status: AC

## 2018-03-03 MED FILL — Lidocaine HCl Local Inj 1%: INTRAMUSCULAR | Qty: 20 | Status: AC

## 2018-03-03 MED FILL — Potassium Chloride Inj 2 mEq/ML: INTRAVENOUS | Qty: 40 | Status: AC

## 2018-03-03 MED FILL — Heparin Sodium (Porcine) Inj 1000 Unit/ML: INTRAMUSCULAR | Qty: 30 | Status: AC

## 2018-03-03 NOTE — Anesthesia Postprocedure Evaluation (Signed)
Anesthesia Post Note  Patient: Vincent Stevens  Procedure(s) Performed: CORONARY ARTERY BYPASS GRAFTING (CABG) times three using the right saphaneous vein. Harvested endoscopicly; and left internal mammary artery. (N/A Chest) TRANSESOPHAGEAL ECHOCARDIOGRAM (TEE) (N/A )     Patient location during evaluation: ICU Anesthesia Type: General Level of consciousness: awake Pain management: pain level controlled Vital Signs Assessment: post-procedure vital signs reviewed and stable Respiratory status: spontaneous breathing, nonlabored ventilation, respiratory function stable and patient connected to nasal cannula oxygen Cardiovascular status: blood pressure returned to baseline and stable Postop Assessment: no apparent nausea or vomiting Anesthetic complications: no    Last Vitals:  Vitals:   03/03/18 2230 03/03/18 2300  BP: 97/60 101/63  Pulse: 87 88  Resp: 15 14  Temp:    SpO2: 93% 95%    Last Pain:  Vitals:   03/03/18 2225  TempSrc:   PainSc: Asleep                 Ryan P Ellender

## 2018-03-03 NOTE — Discharge Summary (Addendum)
Physician Discharge Summary  Patient ID: Vincent Stevens MRN: [id number] DOB/AGE: [birth year] 72 y.o.  Admit date: 03/01/2018 Discharge date: 03/07/2018  Admission Diagnoses:  Patient Active Problem List   Diagnosis Date Noted  . Angina, class III ([place]) 03/01/2018  . Left main coronary artery disease   . Chest pain 02/23/2018  . SOB (shortness of breath) 02/23/2018  . Impaired glucose tolerance 02/20/2013  . Dyslipidemia 08/14/2010  . GOUT, ACUTE 11/07/2009  . BENIGN PROSTATIC HYPERTROPHY 09/19/2009  . Essential hypertension 01/17/2009  . History of colonic polyps 09/27/2008   Discharge Diagnoses:   Patient Active Problem List   Diagnosis Date Noted  . Angina, class III ([place]) 03/01/2018  . S/P CABG x 3 03/01/2018  . Left main coronary artery disease   . Chest pain 02/23/2018  . SOB (shortness of breath) 02/23/2018  . Impaired glucose tolerance 02/20/2013  . Dyslipidemia 08/14/2010  . GOUT, ACUTE 11/07/2009  . BENIGN PROSTATIC HYPERTROPHY 09/19/2009  . Essential hypertension 01/17/2009  . History of colonic polyps 09/27/2008   Discharged Condition: good  History of Present Illness:  Mr. Vincent Stevens is a 72 yo male with history of Hypertension and Hyperlipidemia.  The patient had been in his normal state of health until December when he was diagnosed with Bronchitis.  Since that time he developed substernal chest discomfort with radiation into both shoulders.  This was mainly associated with exertion.  This was associated with shortness of breath.  Symptoms relieved with rest, but have been occurring on a daily basis.  He was evaluated by Dr. Bettina Gavia on 02/24/2018 who felt the patient should undergo cardiac catheterization with possible PCI.  This was performed on 03/01/2018 by Dr. Ellyn Hack and revealed severe 3 vessel CAD with LM involvement.  He required placement of Intra Aortic Balloon Pump and emergent coronary bypass grafting was indicated and TCTS consult was obtained.  The patient  was evaluated by Dr. Cyndia Bent who was in agreement the patient would benefit from emergent bypass surgery.  The risks and benefits of the procedure were explained to the patient and he was agreeable to proceed.  Hospital Course:   Mr. Vincent Stevens was taken emergently to the operating room.  He underwent CABG x 3 utilizing LIMA to LAD, SVG to OM, and SVG to PDA.  He also underwent endoscopic harvest of greater saphenous vein from right leg.  He tolerating the procedure without difficulty and was taken to the SICU in stable condition.  During his stay in the SICU the patient was weaned and extubated on POD #1. His chest tubes were removed without difficulty.  His balloon pump and right radial artery sheath were removed without difficulty. He continued to progress while in the ICU and was transferred to the telemetry unit for continued care on  03/06/2018. We were weaning his midodrine and trending his blood pressure carefully. We discontinued his epicardial pacing wires. We held beta blockade due to some episodes of bradycardia and borderline blood pressure. He continued to ambulate with limited assistance, his incision was healing well, he was on room air and ready for discharge home with his family.   Significant Diagnostic Studies: angiography:    The left ventricular systolic function is normal.  LV end diastolic pressure is normal.  The left ventricular ejection fraction is 55-65% by visual estimate.  There is no mitral valve regurgitation.  There is no aortic valve stenosis.  There is no mitral valve regurgitation.  Ost RPDA lesion is 70% stenosed.  RPDA lesion is  70% stenosed.  Prox Cx lesion is 75% stenosed.  Ost 1st Mrg lesion is 55% stenosed.  Dist Cx lesion is 35% stenosed.  Mid LM to Ost LAD lesion is 95% stenosed.  Ost Cx lesion is 95% stenosed.  Prox LAD lesion is 85% stenosed.   Severe multivessel disease including left main-LAD-circumflex 95% (1,1,1) with 90%pLAD, p80%Cx and  tandem 70% ost-p RDPA. Relatively normal LVEF and LVEDP.  Treatments: surgery:    1. Median Sternotomy 2. Extracorporeal circulation 3.   Emergent Coronary artery bypass grafting x 3   Left internal mammary graft to the LAD  SVG to OM  SVG to PDA  4.   Endoscopic vein harvest from the right leg   Discharge Exam: Blood pressure 127/82, pulse 78, temperature 98.2 F (36.8 C), temperature source Oral, resp. rate (!) 23, height 5\' 8"  (1.727 m), weight 202 lb 11.2 oz (91.9 kg), SpO2 95 %.   General appearance: alert, cooperative and no distress Heart: regular rate and rhythm, S1, S2 normal, no murmur, click, rub or gallop Lungs: clear to auscultation bilaterally Abdomen: soft, non-tender; bowel sounds normal; no masses,  no organomegaly Extremities: extremities normal, atraumatic, no cyanosis or edema Wound: clean and dry    Disposition: Discharge disposition: 01-Home or Self Care     Home  Discharge medications:  The patient has been discharged on:   1.Beta Blocker:  Yes [   ]                              No   [ x  ]                              If No, reason:bradycardia  2.Ace Inhibitor/ARB: Yes [   ]                                     No  [  x  ]                                     If No, reason:hypotension  3.Statin:   Yes [  x ]                  No  [   ]                  If No, reason:  4.Ecasa:  Yes  [  x ]                  No   [   ]                  If No, reason:     Discharge Instructions    Discharge patient   Complete by:  As directed    Discharge disposition:  01-Home or Self Care   Discharge patient date:  03/07/2018     Allergies as of 03/07/2018      Reactions   Sulfamethoxazole Hives, Itching      Medication List    STOP taking these medications   amLODipine 5 MG tablet Commonly known as:  NORVASC   ibuprofen 200 MG tablet Commonly known as:  ADVIL,MOTRIN  TAKE these medications   aspirin 325 MG EC tablet Take 1  tablet (325 mg total) by mouth daily. What changed:    medication strength  how much to take   multivitamin tablet Take 1 tablet by mouth daily.   oxyCODONE 5 MG immediate release tablet Commonly known as:  Oxy IR/ROXICODONE Take 1 tablet (5 mg total) by mouth every 6 (six) hours as needed for severe pain.   simvastatin 20 MG tablet Commonly known as:  ZOCOR Take 1 tablet (20 mg total) by mouth at bedtime.   tamsulosin 0.4 MG Caps capsule Commonly known as:  FLOMAX Take 1 capsule (0.4 mg total) by mouth daily.      Follow-up Information    Gaye Pollack, MD Follow up on 04/05/2018.   Specialty:  Cardiothoracic Surgery Why:  Appointment is at 2:00.  Please get CXR at 1:30 at [facility] located on first floor of our office building Contact information: [address] [place] [address] [phone]        [facility] Follow up on 03/13/2018.   Specialty:  Cardiothoracic Surgery Why:  Appointment is at 10:00 with nurse for suture removal Contact information: [place], [place]       Marletta Lor, MD. Call in 1 day(s).   Specialty:  Internal Medicine Contact information: [place] [address] [phone]        Richardo Priest, MD Follow up.   Specialty:  Cardiology Why:  Your follow-up appointment is on March 20, 2018 at 3 PM.  Please bring your medication list. Contact information: [address] [place] [address] [phone]           Signed: Elgie Collard 03/07/2018, 10:16 AM

## 2018-03-03 NOTE — Progress Notes (Signed)
2 Days Post-Op Procedure(s) (LRB): CORONARY ARTERY BYPASS GRAFTING (CABG) times three using the right saphaneous vein. Harvested endoscopicly; and left internal mammary artery. (N/A) TRANSESOPHAGEAL ECHOCARDIOGRAM (TEE) (N/A) Subjective:  No complaints. Did not sleep much. Pain under control  Objective: Vital signs in last 24 hours: Temp:  [97.7 F (36.5 C)-99.1 F (37.3 C)] 98.4 F (36.9 C) (04/05 0731) Pulse Rate:  [79-89] 88 (04/05 0700) Cardiac Rhythm: Atrial paced (04/05 0400) Resp:  [0-34] 11 (04/05 0700) BP: (67-121)/(48-79) 103/66 (04/05 0700) SpO2:  [89 %-100 %] 96 % (04/05 0700) Weight:  [95.9 kg (211 lb 6.4 oz)] 95.9 kg (211 lb 6.4 oz) (04/05 0500)  Hemodynamic parameters for last 24 hours: PAP: (31-35)/(15-16) 31/15  Intake/Output from previous day: 04/04 0701 - 04/05 0700 In: 07-13-1946 [P.O.:240; I.V.:1606; IV Piggyback:100] Out: 640 [Urine:600; Chest Tube:40] Intake/Output this shift: No intake/output data recorded.  General appearance: alert and cooperative Neurologic: intact Heart: regular rate and rhythm, S1, S2 normal, no murmur, click, rub or gallop Lungs: clear to auscultation bilaterally Extremities: edema mild Wound: dressings dry  Lab Results: Recent Labs    03/02/18 1621 03/02/18 1622 03/03/18 0554  WBC 14.8*  --  13.5*  HGB 13.8 14.3 12.2*  HCT 41.7 42.0 36.8*  PLT 158  --  131*   BMET:  Recent Labs    03/02/18 0344 03/02/18 1621 03/02/18 1622  NA 136  --  138  K 4.3  --  4.3  CL 108  --  102  CO2 21*  --   --   GLUCOSE 138*  --  148*  BUN 10  --  15  CREATININE 0.94 0.99 0.90  CALCIUM 7.6*  --   --     PT/INR:  Recent Labs    03/01/18 1839  LABPROT 15.5*  INR 1.24   ABG    Component Value Date/Time   PHART 7.336 (L) 03/02/2018 0533   HCO3 21.6 03/02/2018 0533   TCO2 23 03/02/2018 1622   ACIDBASEDEF 4.0 (H) 03/02/2018 0533   O2SAT 98.0 03/02/2018 0533   CBG (last 3)  Recent Labs    03/02/18 2022 03/03/18 0055  03/03/18 0558  GLUCAP 124* 130* 104*   CXR: ok  Assessment/Plan: S/P Procedure(s) (LRB): CORONARY ARTERY BYPASS GRAFTING (CABG) times three using the right saphaneous vein. Harvested endoscopicly; and left internal mammary artery. (N/A) TRANSESOPHAGEAL ECHOCARDIOGRAM (TEE) (N/A)  POD 2 Hemodynamically stable but still neo dependent. Will start midodrine and wean neo as tolerated. He was on Norvasc preop for HTN so I think the midodrine will be able to be weaned off fairly quickly.  Sinus bradycardia baseline with HR 50. No beta blocker.  Volume excess: weight is only 4 lbs over preop. Will hold off on diuresis until off neo.  DM: glucose under good control. No hx of DM but previous Hgb A1c was 6.2. Will stop Levemir and continue CBG and SSI TID for another day or so to be sure glucose staying controlled.  IS, ambulation  DC foley.   LOS: 2 days    Gaye Pollack 03/03/2018

## 2018-03-03 NOTE — Progress Notes (Addendum)
EVENING ROUNDS NOTE :     [address]       [place] [address]             [phone]                 2 Days Post-Op Procedure(s) (LRB): CORONARY ARTERY BYPASS GRAFTING (CABG) times three using the right saphaneous vein. Harvested endoscopicly; and left internal mammary artery. (N/A) TRANSESOPHAGEAL ECHOCARDIOGRAM (TEE) (N/A)  Patient sleeping-not awakened. A paced.  Total Length of Stay:  LOS: 2 days  BP 114/77   Pulse 88   Temp 98.5 F (36.9 C) (Oral)   Resp 19   Ht 5\' 8"  (1.727 m)   Wt 211 lb 6.4 oz (95.9 kg)   SpO2 94%   BMI 32.14 kg/m   .Intake/Output      04/05 0701 - 04/06 0700   P.O. 240   I.V. (mL/kg) 738.8 (7.7)   IV Piggyback 200   Total Intake(mL/kg) 1178.8 (12.3)   Urine (mL/kg/hr) 850 (0.6)   Chest Tube    Total Output 850   Net +328.8         . sodium chloride 20 mL/hr at 03/03/18 1900  . sodium chloride    . sodium chloride 10 mL/hr at 03/03/18 1900  . lactated ringers    . lactated ringers Stopped (03/02/18 0300)  . lactated ringers 20 mL/hr at 03/03/18 2130  . nitroGLYCERIN    . phenylephrine (NEO-SYNEPHRINE) Adult infusion Stopped (03/03/18 1050)     Lab Results  Component Value Date   WBC 13.5 (H) 03/03/2018   HGB 12.2 (L) 03/03/2018   HCT 36.8 (L) 03/03/2018   PLT 131 (L) 03/03/2018   GLUCOSE 116 (H) 03/03/2018   CHOL 170 06/24/2017   TRIG 104.0 06/24/2017   HDL 49.00 06/24/2017   LDLCALC 100 (H) 06/24/2017   ALT 16 06/24/2017   AST 22 06/24/2017   NA 133 (L) 03/03/2018   K 4.1 03/03/2018   CL 100 (L) 03/03/2018   CREATININE 0.85 03/03/2018   BUN 14 03/03/2018   CO2 24 03/03/2018   TSH 2.71 06/24/2017   PSA 0.40 06/16/2016   INR 1.24 03/01/2018   HGBA1C 6.2 06/24/2017  Supplement calcium as not done earlier Continue present management.   Lars Pinks PA-C 03/03/2018 10:01 PM

## 2018-03-04 LAB — BASIC METABOLIC PANEL
Anion gap: 10 (ref 5–15)
BUN: 16 mg/dL (ref 6–20)
CALCIUM: 8.2 mg/dL — AB (ref 8.9–10.3)
CO2: 23 mmol/L (ref 22–32)
Chloride: 99 mmol/L — ABNORMAL LOW (ref 101–111)
Creatinine, Ser: 0.93 mg/dL (ref 0.61–1.24)
GFR calc non Af Amer: >60 mL/min (ref >60)
GLUCOSE: 124 mg/dL — AB (ref 65–99)
POTASSIUM: 4 mmol/L (ref 3.5–5.1)
Sodium: 132 mmol/L — ABNORMAL LOW (ref 135–145)

## 2018-03-04 LAB — CBC
HEMATOCRIT: 37.3 % — AB (ref 39.0–52.0)
HEMOGLOBIN: 12.3 g/dL — AB (ref 13.0–17.0)
MCH: 29.1 pg (ref 26.0–34.0)
MCHC: 33 g/dL (ref 30.0–36.0)
MCV: 88.4 fL (ref 78.0–100.0)
Platelets: 143 10*3/uL — ABNORMAL LOW (ref 150–400)
RBC: 4.22 MIL/uL (ref 4.22–5.81)
RDW: 14.5 % (ref 11.5–15.5)
WBC: 12.2 10*3/uL — AB (ref 4.0–10.5)

## 2018-03-04 LAB — GLUCOSE, CAPILLARY
Glucose-Capillary: 96 mg/dL (ref 65–99)
Glucose-Capillary: 110 mg/dL — ABNORMAL HIGH (ref 65–99)
Glucose-Capillary: 106 mg/dL — ABNORMAL HIGH (ref 65–99)
Glucose-Capillary: 104 mg/dL — ABNORMAL HIGH (ref 65–99)

## 2018-03-04 NOTE — Progress Notes (Signed)
3 Days Post-Op Procedure(s) (LRB): CORONARY ARTERY BYPASS GRAFTING (CABG) times three using the right saphaneous vein. Harvested endoscopicly; and left internal mammary artery. (N/A) TRANSESOPHAGEAL ECHOCARDIOGRAM (TEE) (N/A) Subjective: No complaints this AM  Objective: Vital signs in last 24 hours: Temp:  [98.2 F (36.8 C)-99.5 F (37.5 C)] 99.1 F (37.3 C) (04/06 0729) Pulse Rate:  [40-90] 88 (04/06 0900) Cardiac Rhythm: Atrial paced (04/06 0800) Resp:  [13-31] 20 (04/06 0900) BP: (95-140)/(58-89) 104/74 (04/06 0900) SpO2:  [83 %-100 %] 97 % (04/06 0900) Weight:  [211 lb 6.4 oz (95.9 kg)] 211 lb 6.4 oz (95.9 kg) (04/06 0500)  Hemodynamic parameters for last 24 hours:    Intake/Output from previous day: 04/05 0701 - 04/06 0700 In: 1198.8 [P.O.:240; I.V.:648.8; IV Piggyback:310] Out: 850 [Urine:850] Intake/Output this shift: Total I/O In: 160 [P.O.:120; I.V.:40] Out: -   General appearance: alert, cooperative and no distress Neurologic: intact Heart: regular rate and rhythm Lungs: diminished breath sounds bibasilar Abdomen: normal findings: soft, non-tender  Lab Results: Recent Labs    03/03/18 0554 03/04/18 0746  WBC 13.5* 12.2*  HGB 12.2* 12.3*  HCT 36.8* 37.3*  PLT 131* 143*   BMET:  Recent Labs    03/03/18 0554 03/04/18 0746  NA 133* 132*  K 4.1 4.0  CL 100* 99*  CO2 24 23  GLUCOSE 116* 124*  BUN 14 16  CREATININE 0.85 0.93  CALCIUM 7.6* 8.2*    PT/INR:  Recent Labs    03/01/18 1839  LABPROT 15.5*  INR 1.24   ABG    Component Value Date/Time   PHART 7.336 (L) 03/02/2018 0533   HCO3 21.6 03/02/2018 0533   TCO2 23 03/02/2018 1622   ACIDBASEDEF 4.0 (H) 03/02/2018 0533   O2SAT 98.0 03/02/2018 0533   CBG (last 3)  Recent Labs    03/03/18 1606 03/03/18 1941 03/04/18 0730  GLUCAP 100* 99 96    Assessment/Plan: S/P Procedure(s) (LRB): CORONARY ARTERY BYPASS GRAFTING (CABG) times three using the right saphaneous vein. Harvested  endoscopicly; and left internal mammary artery. (N/A) TRANSESOPHAGEAL ECHOCARDIOGRAM (TEE) (N/A) -Looks good this morning CV- BP better with midodrine  HR 72 and SR currently, will decrease AAI to 68 and follow RESP- continue IS RENAL- creatinine Ok ENDO- CBG well controlled Continue ambulation Dc central line   LOS: 3 days    Melrose Nakayama 03/04/2018

## 2018-03-04 NOTE — Progress Notes (Signed)
      [address]       [place] [address]             [phone]      Stable day No new issues  BP 138/73   Pulse 88   Temp 98.6 F (37 C) (Oral)   Resp 12   Ht 5\' 8"  (1.727 m)   Wt 211 lb 6.4 oz (95.9 kg)   SpO2 97%   BMI 32.14 kg/m   Intake/Output Summary (Last 24 hours) at 03/04/2018 1907 Last data filed at 03/04/2018 1800 Gross per 24 hour  Intake 990 ml  Output 350 ml  Net 640 ml    Remo Lipps C. Roxan Hockey, MD [facility] [phone]

## 2018-03-05 ENCOUNTER — Other Ambulatory Visit: Payer: Self-pay

## 2018-03-05 ENCOUNTER — Encounter (HOSPITAL_COMMUNITY): Payer: Self-pay

## 2018-03-05 LAB — TYPE AND SCREEN
ABO/RH(D): A POS
ANTIBODY SCREEN: NEGATIVE
UNIT DIVISION: 0
UNIT DIVISION: 0
Unit division: 0
Unit division: 0

## 2018-03-05 LAB — BPAM RBC
Blood Product Expiration Date: 201904252359
Blood Product Expiration Date: 201904262359
Blood Product Expiration Date: 201904262359
Blood Product Expiration Date: 201904262359
Unit Type and Rh: 6200
Unit Type and Rh: 6200
Unit Type and Rh: 6200
Unit Type and Rh: 6200

## 2018-03-05 LAB — GLUCOSE, CAPILLARY
GLUCOSE-CAPILLARY: 113 mg/dL — AB (ref 65–99)
GLUCOSE-CAPILLARY: 99 mg/dL (ref 65–99)

## 2018-03-05 NOTE — Progress Notes (Signed)
Patient transferred from [facility] to [id number]. VSS. Placed on telemetry monitor. Patient states pain 2/10. All questions answered. Oriented to room and unit. Call bell within reach. Will continue to monitor. Lajoyce Corners, RN

## 2018-03-05 NOTE — Plan of Care (Signed)
Progressing in all areas, ambulating well,  lungs clear. Transferring to 4 E

## 2018-03-05 NOTE — Progress Notes (Addendum)
4 Days Post-Op Procedure(s) (LRB): CORONARY ARTERY BYPASS GRAFTING (CABG) times three using the right saphaneous vein. Harvested endoscopicly; and left internal mammary artery. (N/A) TRANSESOPHAGEAL ECHOCARDIOGRAM (TEE) (N/A) Subjective: No complaints this AM  Objective: Vital signs in last 24 hours: Temp:  [98 F (36.7 C)-98.6 F (37 C)] 98.6 F (37 C) (04/07 0743) Pulse Rate:  [74-85] 77 (04/07 0800) Cardiac Rhythm: Normal sinus rhythm;Atrial paced (04/07 0800) Resp:  [6-31] 16 (04/07 0800) BP: (88-138)/(59-94) 104/69 (04/07 0800) SpO2:  [93 %-100 %] 93 % (04/07 0800) Weight:  [208 lb 11.2 oz (94.7 kg)] 208 lb 11.2 oz (94.7 kg) (04/07 0500)  Hemodynamic parameters for last 24 hours:    Intake/Output from previous day: 04/06 0701 - 04/07 0700 In: 766 [P.O.:720; I.V.:46] Out: -  Intake/Output this shift: No intake/output data recorded.  General appearance: alert, cooperative and no distress Neurologic: intact Heart: regular rate and rhythm Lungs: diminished breath sounds bilaterally Abdomen: mildly distended, nontender, + BS  Lab Results: Recent Labs    03/03/18 0554 03/04/18 0746  WBC 13.5* 12.2*  HGB 12.2* 12.3*  HCT 36.8* 37.3*  PLT 131* 143*   BMET:  Recent Labs    03/03/18 0554 03/04/18 0746  NA 133* 132*  K 4.1 4.0  CL 100* 99*  CO2 24 23  GLUCOSE 116* 124*  BUN 14 16  CREATININE 0.85 0.93  CALCIUM 7.6* 8.2*    PT/INR: No results for input(s): LABPROT, INR in the last 72 hours. ABG    Component Value Date/Time   PHART 7.336 (L) 03/02/2018 0533   HCO3 21.6 03/02/2018 0533   TCO2 23 03/02/2018 1622   ACIDBASEDEF 4.0 (H) 03/02/2018 0533   O2SAT 98.0 03/02/2018 0533   CBG (last 3)  Recent Labs    03/04/18 1716 03/04/18 2138 03/05/18 0738  GLUCAP 106* 104* 113*    Assessment/Plan: S/P Procedure(s) (LRB): CORONARY ARTERY BYPASS GRAFTING (CABG) times three using the right saphaneous vein. Harvested endoscopicly; and left internal mammary  artery. (N/A) TRANSESOPHAGEAL ECHOCARDIOGRAM (TEE) (N/A) Plan for transfer to step-down: see transfer orders  CV- HR in SR in 70-80 range, BP OK- continue midodrine for now RESP- continue IS RENAL- no issues - recheck BMET tomorrow ENDO- CBG well controlled Continue cardiac rehab   LOS: 4 days    Vincent Stevens 03/05/2018

## 2018-03-06 ENCOUNTER — Encounter (HOSPITAL_COMMUNITY): Payer: Self-pay

## 2018-03-06 ENCOUNTER — Inpatient Hospital Stay (HOSPITAL_COMMUNITY): Payer: Medicare HMO

## 2018-03-06 LAB — CBC
HCT: 34.1 % — ABNORMAL LOW (ref 39.0–52.0)
HEMOGLOBIN: 11.4 g/dL — AB (ref 13.0–17.0)
MCH: 29.1 pg (ref 26.0–34.0)
MCHC: 33.4 g/dL (ref 30.0–36.0)
MCV: 87 fL (ref 78.0–100.0)
Platelets: 205 10*3/uL (ref 150–400)
RBC: 3.92 MIL/uL — ABNORMAL LOW (ref 4.22–5.81)
RDW: 14.1 % (ref 11.5–15.5)
WBC: 7.4 10*3/uL (ref 4.0–10.5)

## 2018-03-06 LAB — BASIC METABOLIC PANEL
Anion gap: 11 (ref 5–15)
BUN: 16 mg/dL (ref 6–20)
CALCIUM: 8.3 mg/dL — AB (ref 8.9–10.3)
CHLORIDE: 100 mmol/L — AB (ref 101–111)
CO2: 24 mmol/L (ref 22–32)
CREATININE: 1.01 mg/dL (ref 0.61–1.24)
GFR calc non Af Amer: >60 mL/min (ref >60)
Glucose, Bld: 121 mg/dL — ABNORMAL HIGH (ref 65–99)
Potassium: 3.7 mmol/L (ref 3.5–5.1)
SODIUM: 135 mmol/L (ref 135–145)

## 2018-03-06 MED ORDER — SODIUM CHLORIDE 0.9 % IV SOLN: 250.0000 mL | INTRAVENOUS | Status: DC | PRN

## 2018-03-06 MED ORDER — MIDODRINE HCL 5 MG PO TABS
5.0000 mg | ORAL_TABLET | Freq: Three times a day (TID) | ORAL | Status: DC
  Administered 2018-03-06 (×3): 5 mg via ORAL
  Filled 2018-03-06 (×3): qty 1

## 2018-03-06 MED ORDER — SODIUM CHLORIDE 0.9% FLUSH
3.0000 mL | Freq: Two times a day (BID) | INTRAVENOUS | Status: DC
  Administered 2018-03-06 (×2): 3 mL via INTRAVENOUS

## 2018-03-06 MED ORDER — MOVING RIGHT ALONG BOOK
Freq: Once | Status: DC
  Filled 2018-03-06: qty 1

## 2018-03-06 MED ORDER — SODIUM CHLORIDE 0.9% FLUSH: 3.0000 mL | INTRAVENOUS | Status: DC | PRN

## 2018-03-06 MED ORDER — MAGNESIUM HYDROXIDE 400 MG/5ML PO SUSP: 30.0000 mL | Freq: Every day | ORAL | Status: DC | PRN

## 2018-03-06 NOTE — Progress Notes (Signed)
CARDIAC REHAB PHASE I   PRE:  Rate/Rhythm: 75 SR  BP:  Supine:   Sitting: 92/61  Standing:    SaO2: 99%RA  MODE:  Ambulation: 370 ft   POST:  Rate/Rhythm: 92 SR  BP:  Supine:   Sitting: 162/83, 139/72  Standing:    SaO2: 99%RA 0840-0902 Pt stated he had walked earlier but ready to go again. Pt walked 370 ft on RA with rolling walker and minimal asst. Tolerated well. To recliner after walk. Pt does not think he will need walker for home as he has one available if needed.   Graylon Good, RN BSN  03/06/2018 8:59 AM

## 2018-03-06 NOTE — Progress Notes (Signed)
Pacing wires removed at this time. Sites clean and dry. Pt tolerated well. Pt instructed to stay in bed for 1 hour for observation. Pt verbalized understanding. Vitals stable and being monitored per protocol.   Emelda Fear, RN

## 2018-03-06 NOTE — Progress Notes (Deleted)
Several reminders this am to patient on sternal precautions. Will monitor patient. Mariene Dickerman, Bettina Gavia RN

## 2018-03-06 NOTE — Progress Notes (Signed)
Patient ambulated in hallway with nursing staff along side spouse with walker. Patient tolerated well back in room in chair will monitor patient. Breyon Blass, Bettina Gavia RN

## 2018-03-06 NOTE — Progress Notes (Signed)
Pt ambulated the halls this AM approx 900 ft. Standby assist, rolling walker, RA. Tolerated well. Pt back to chair. Will continue to monitor.  Jaymes Graff, RN

## 2018-03-06 NOTE — Progress Notes (Addendum)
      [address]       [place] [address]             [phone]      5 Days Post-Op Procedure(s) (LRB): CORONARY ARTERY BYPASS GRAFTING (CABG) times three using the right saphaneous vein. Harvested endoscopicly; and left internal mammary artery. (N/A) TRANSESOPHAGEAL ECHOCARDIOGRAM (TEE) (N/A) Subjective: Feels okay this morning. Sleepy.   Objective: Vital signs in last 24 hours: Temp:  [97.6 F (36.4 C)-99.6 F (37.6 C)] 99.6 F (37.6 C) (04/08 0455) Pulse Rate:  [68-83] 83 (04/07 2036) Cardiac Rhythm: Normal sinus rhythm (04/08 0700) Resp:  [13-20] 20 (04/08 0455) BP: (91-129)/(66-90) 109/70 (04/08 0455) SpO2:  [93 %-100 %] 93 % (04/08 0455) Weight:  [208 lb 4.8 oz (94.5 kg)] 208 lb 4.8 oz (94.5 kg) (04/07 2036)     Intake/Output from previous day: 04/07 0701 - 04/08 0700 In: 366 [P.O.:360; I.V.:6] Out: 530 [Urine:530] Intake/Output this shift: No intake/output data recorded.  General appearance: alert, cooperative and no distress Heart: regular rate and rhythm, S1, S2 normal, no murmur, click, rub or gallop Lungs: clear to auscultation bilaterally Abdomen: soft, non-tender; bowel sounds normal; no masses,  no organomegaly Extremities: extremities normal, atraumatic, no cyanosis or edema Wound: clean and dry  Lab Results: Recent Labs    03/04/18 0746  WBC 12.2*  HGB 12.3*  HCT 37.3*  PLT 143*   BMET:  Recent Labs    03/04/18 0746  NA 132*  K 4.0  CL 99*  CO2 23  GLUCOSE 124*  BUN 16  CREATININE 0.93  CALCIUM 8.2*    PT/INR: No results for input(s): LABPROT, INR in the last 72 hours. ABG    Component Value Date/Time   PHART 7.336 (L) 03/02/2018 0533   HCO3 21.6 03/02/2018 0533   TCO2 23 03/02/2018 1622   ACIDBASEDEF 4.0 (H) 03/02/2018 0533   O2SAT 98.0 03/02/2018 0533   CBG (last 3)  Recent Labs    03/04/18 2138 03/05/18 0738 03/05/18 1207  GLUCAP 104* 113* 99    Assessment/Plan: S/P Procedure(s)  (LRB): CORONARY ARTERY BYPASS GRAFTING (CABG) times three using the right saphaneous vein. Harvested endoscopicly; and left internal mammary artery. (N/A) TRANSESOPHAGEAL ECHOCARDIOGRAM (TEE) (N/A)  1. CV-HR is NSR in the 80s. He did have a few episodes of sinus brady with a rate in the 40s. Not on a BB.  BP low-normal . Tolerating midodrine TID, ASA, and simvastatin.  2. Pulm-Good oxygen saturation on room air. No new chest xray today.  3. Renal-No new labs, creatinine has been stable at 0.93. Await BMET.  4. H and H has been stable 5. Blood glucose level well controlled.  Plan: Await BMET. Ambulate in the halls today. Weaning Midodrine. Pull EPW. Possibly home tomorrow if he remains stable.    LOS: 5 days    Elgie Collard 03/06/2018   Chart reviewed, patient examined, agree with above. BP is better so will decrease Midodrine to 5 tid today. If pressure remains stable then will stop it tomorrow and send home. Will not resume his previous Norvasc until seen in the office. His HR is 70-80 sinus with few episodes of sinus brady. Will not put on beta blocker with this.

## 2018-03-07 ENCOUNTER — Encounter (HOSPITAL_COMMUNITY): Payer: Self-pay | Admitting: *Deleted

## 2018-03-07 MED ORDER — OXYCODONE HCL 5 MG PO TABS: 5.0000 mg | ORAL_TABLET | Freq: Four times a day (QID) | ORAL | 0 refills | Status: DC | PRN

## 2018-03-07 MED ORDER — ASPIRIN 325 MG PO TBEC: 325.0000 mg | DELAYED_RELEASE_TABLET | Freq: Every day | ORAL | 0 refills | Status: DC

## 2018-03-07 MED FILL — Electrolyte-R (PH 7.4) Solution: INTRAVENOUS | Qty: 5000 | Status: AC

## 2018-03-07 MED FILL — Sodium Bicarbonate IV Soln 8.4%: INTRAVENOUS | Qty: 50 | Status: AC

## 2018-03-07 MED FILL — Heparin Sodium (Porcine) Inj 1000 Unit/ML: INTRAMUSCULAR | Qty: 20 | Status: AC

## 2018-03-07 MED FILL — Mannitol IV Soln 20%: INTRAVENOUS | Qty: 500 | Status: AC

## 2018-03-07 MED FILL — Lidocaine HCl IV Inj 20 MG/ML: INTRAVENOUS | Qty: 5 | Status: AC

## 2018-03-07 MED FILL — Sodium Chloride IV Soln 0.9%: INTRAVENOUS | Qty: 2000 | Status: AC

## 2018-03-07 NOTE — Plan of Care (Signed)
Care plan reviewed, patient is progressing.  

## 2018-03-07 NOTE — Progress Notes (Signed)
CARDIAC REHAB PHASE I   PRE:  Rate/Rhythm: 77 SR  BP:  Supine:   Sitting: 107/61  Standing:    SaO2: 97%RA  MODE:  Ambulation: 700 ft   POST:  Rate/Rhythm: 90 SR  BP:  Supine:   Sitting: 132/75  Standing:    SaO2: 99%RA 1005-1052 Pt walked 700 ft with rolling walker and minimal asst. Does not think he will need walker for home use. Reviewed sternal precautions and staying in the tube, importance of IS, ex ed and heart healthy food choices  And CRP 2. Referring to [facility] CRP 2.    Graylon Good, RN BSN  03/07/2018 10:48 AM

## 2018-03-07 NOTE — Progress Notes (Signed)
Discharge instructions given to patient and spouse.  Discussed new medications and medication changes.  Discussed activity restrictions, signs and symptoms to contact the physician for.  All questions answered, and patient and spouse verbalized understanding.

## 2018-03-07 NOTE — Care Management Important Message (Signed)
Important Message  Patient Details  Name: Vincent Stevens MRN: [id number] Date of Birth: [birth year]   Medicare Important Message Given:  Yes    [name] P Orchard 03/07/2018, 12:46 PM

## 2018-03-07 NOTE — Discharge Instructions (Signed)
Discharge Instructions:  1. You may shower, please wash incisions daily with soap and water and keep dry.  If you wish to cover wounds with dressing you may do so but please keep clean and change daily.  No tub baths or swimming until incisions have completely healed.  If your incisions become red or develop any drainage please call our office at [phone]  2. No Driving until cleared by Dr. Vivi Martens office and you are no longer using narcotic pain medications  3. Monitor your weight daily.. Please use the same scale and weigh at same time... If you gain 5-10 lbs in 48 hours with associated lower extremity swelling, please contact our office at [phone]  4. Fever of 101.5 for at least 24 hours with no source, please contact our office at [phone]  5. Activity- up as tolerated, please walk at least 3 times per day.  Avoid strenuous activity, no lifting, pushing, or pulling with your arms over 8-10 lbs for a minimum of 6 weeks  6. If any questions or concerns arise, please do not hesitate to contact our office at [phone]      Coronary Artery Bypass Grafting, Care After  This sheet gives you information about how to care for yourself after your procedure. Your health care provider may also give you more specific instructions. If you have problems or questions, contact your health care provider. What can I expect after the procedure? After the procedure, it is common to have:  Nausea and a lack of appetite.  Constipation.  Weakness and fatigue.  Depression or irritability.  Pain or discomfort in your incision areas.  Follow these instructions at home: Medicines  Take over-the-counter and prescription medicines only as told by your health care provider. Do not stop taking medicines or start any new medicines without approval from your health care provider.  If you were prescribed an antibiotic medicine, take it as told by your health care provider. Do not stop taking  the antibiotic even if you start to feel better.  Do not drive or use heavy machinery while taking prescription pain medicine. Incision care  Follow instructions from your health care provider about how to take care of your incisions. Make sure you: ? Wash your hands with soap and water before you change your bandage (dressing). If soap and water are not available, use hand sanitizer. ? Change your dressing as told by your health care provider. ? Leave stitches (sutures), skin glue, or adhesive strips in place. These skin closures may need to stay in place for 2 weeks or longer. If adhesive strip edges start to loosen and curl up, you may trim the loose edges. Do not remove adhesive strips completely unless your health care provider tells you to do that.  Keep incision areas clean, dry, and protected.  Check your incision areas every day for signs of infection. Check for: ? More redness, swelling, or pain. ? More fluid or blood. ? Warmth. ? Pus or a bad smell.  If incisions were made in your legs: ? Avoid crossing your legs. ? Avoid sitting for long periods of time. Change positions every 30 minutes. ? Raise (elevate) your legs when you are sitting. Bathing  Do not take baths, swim, or use a hot tub until your health care provider approves.  Only take sponge baths. Pat the incisions dry. Do not rub incisions with a washcloth or towel.  Ask your health care provider when you can shower. Eating and drinking  Eat  foods that are high in fiber, such as raw fruits and vegetables, whole grains, beans, and nuts. Meats should be lean cut. Avoid canned, processed, and fried foods. This can help prevent constipation and is a recommended part of a heart-healthy diet.  Drink enough fluid to keep your urine clear or pale yellow.  Limit alcohol intake to no more than 1 drink a day for nonpregnant women and 2 drinks a day for men. One drink equals 12 oz of beer, 5 oz of wine, or 1 oz of hard  liquor. Activity  Rest and limit your activity as told by your health care provider. You may be instructed to: ? Stop any activity right away if you have chest pain, shortness of breath, irregular heartbeats, or dizziness. Get help right away if you have any of these symptoms. ? Move around frequently for short periods or take short walks as directed by your health care provider. Gradually increase your activities. You may need physical therapy or cardiac rehabilitation to help strengthen your muscles and build your endurance. ? Avoid lifting, pushing, or pulling anything that is heavier than 10 lb (4.5 kg) for at least 6 weeks or as told by your health care provider.  Do not drive until your health care provider approves.  Ask your health care provider when you may return to work.  Ask your health care provider when you may resume sexual activity. General instructions  Do not use any products that contain nicotine or tobacco, such as cigarettes and e-cigarettes. If you need help quitting, ask your health care provider.  Take 2-3 deep breaths every few hours during the day, while you recover. This helps expand your lungs and prevent complications like pneumonia after surgery.  If you were given a device called an incentive spirometer, use it several times a day to practice deep breathing. Support your chest with a pillow or your arms when you take deep breaths or cough.  Wear compression stockings as told by your health care provider. These stockings help to prevent blood clots and reduce swelling in your legs.  Weigh yourself every day. This helps identify if your body is holding (retaining) fluid that may make your heart and lungs work harder.  Keep all follow-up visits as told by your health care provider. This is important. Contact a health care provider if:  You have more redness, swelling, or pain around any incision.  You have more fluid or blood coming from any incision.  Any  incision feels warm to the touch.  You have pus or a bad smell coming from any incision  You have a fever.  You have swelling in your ankles or legs.  You have pain in your legs.  You gain 2 lb (0.9 kg) or more a day.  You are nauseous or you vomit.  You have diarrhea. Get help right away if:  You have chest pain that spreads to your jaw or arms.  You are short of breath.  You have a fast or irregular heartbeat.  You notice a "clicking" in your breastbone (sternum) when you move.  You have numbness or weakness in your arms or legs.  You feel dizzy or light-headed. Summary  After the procedure, it is common to have pain or discomfort in the incision areas.  Do not take baths, swim, or use a hot tub until your health care provider approves.  Gradually increase your activities. You may need physical therapy or cardiac rehabilitation to help strengthen your  muscles and build your endurance.  Weigh yourself every day. This helps identify if your body is holding (retaining) fluid that may make your heart and lungs work harder. This information is not intended to replace advice given to you by your health care provider. Make sure you discuss any questions you have with your health care provider. Document Released: 06/04/2005 Document Revised: 10/04/2016 Document Reviewed: 10/04/2016 Elsevier Interactive Patient Education  [employer].

## 2018-03-07 NOTE — Care Management Note (Signed)
Case Management Note Marvetta Gibbons RN, BSN Unit 4E-Case Manager- [place] coverage [phone]  Patient Details  Name: Vincent Stevens MRN: [id number] Date of Birth: 02-08-1946  Subjective/Objective:  Pt admitted s/p CABGx3                  Action/Plan: PTA pt lived at home with spouse- CM to follow for transition of care needs  Expected Discharge Date:  03/07/18               Expected Discharge Plan:  Home/Self Care  In-House Referral:     Discharge planning Services  CM Consult  Post Acute Care Choice:  NA Choice offered to:  NA  DME Arranged:    DME Agency:     HH Arranged:    [place] Agency:     Status of Service:  Completed, signed off  If discussed at [place] of Stay Meetings, dates discussed:  03/07/18  Discharge Disposition: home/self care  Additional Comments:  03/07/18- 1025- Banessa Mao RN, CM- pt for d/c home today- no CM needs noted for transition to home.  Dawayne Patricia, RN 03/07/2018, 10:26 AM

## 2018-03-07 NOTE — Consult Note (Signed)
            [facility] CM Primary Care Navigator  03/07/2018  NAKHI CHOI 1946-03-28 [id number]   Seen patient and wife Arbie Cookey) at the bedsideto identify possible discharge needs. Patientreports having "difficulty breathing and chest pains"that resulted to this admission/ surgery (status post CORONARY ARTERY BYPASS GRAFTING (CABG x 3).  PatientendorsesDr.Peter Kwiatkowski/ Dr. Alysia Penna with [facility] [place] primary care provider.   Patientshared Psychologist, educational, [facility] on [address] and [facility] Order Delivery service to obtainmedications without any problem.   Patientstatesthat he has been managinghisown medications at homestraight out of the containers.  Patient reports that he has been driving prior to admission/ surgery but hiswifeand daughter Freida Busman) can providetransportation and bring him to hisdoctors'appointmentsafter discharge.  Patientliveswithwife at [place] serve ashisprimary caregiver whendischarged.   Anticipated discharge plan ishomeperpatient.  Patient and wife voiced understanding to call primary care provider's office whenhereturns home, for a post discharge follow-up appointment within1- 2 weeksor sooner if needs arise.Patient letter (with PCP's contact number) was provided asareminder.  Explained topatientregardingTHN CM services available for health managementat homebut hedeniesany needs or concernsat thistime. He statesbeing able to [place] wife's assistance.  Patientand wife expressedunderstandingto seek referral from primary care provider to [facility] care management ifdeemed necessaryand appropriate for anyservices in the future.  [facility] care management information provided for future needs thathemay have.  Patienthowever, hadverbally agreedand optedforEMMIcalls tofollowup his recoveryat home.  Referral made for [facility]  General calls after discharge.    For additional questions please contact:  Edwena Felty A. Sarah Baez, BSN, RN-BC [facility] PRIMARY CARE Navigator Cell: [phone]

## 2018-03-07 NOTE — Progress Notes (Addendum)
      [address]       [place], [address]             [phone]      6 Days Post-Op Procedure(s) (LRB): CORONARY ARTERY BYPASS GRAFTING (CABG) times three using the right saphaneous vein. Harvested endoscopicly; and left internal mammary artery. (N/A) TRANSESOPHAGEAL ECHOCARDIOGRAM (TEE) (N/A) Subjective: Feels good this morning. No issues overnight.   Objective: Vital signs in last 24 hours: Temp:  [98.2 F (36.8 C)-98.5 F (36.9 C)] 98.2 F (36.8 C) (04/09 0426) Pulse Rate:  [73-86] 78 (04/09 0426) Cardiac Rhythm: Normal sinus rhythm (04/09 0700) Resp:  [10-25] 23 (04/09 0426) BP: (108-142)/(66-82) 127/82 (04/09 0426) SpO2:  [95 %-100 %] 95 % (04/09 0426) Weight:  [202 lb 11.2 oz (91.9 kg)] 202 lb 11.2 oz (91.9 kg) (04/09 0426)     Intake/Output from previous day: 04/08 0701 - 04/09 0700 In: 840 [P.O.:840] Out: 1150 [Urine:1150] Intake/Output this shift: No intake/output data recorded.  General appearance: alert, cooperative and no distress Heart: regular rate and rhythm, S1, S2 normal, no murmur, click, rub or gallop Lungs: clear to auscultation bilaterally Abdomen: soft, non-tender; bowel sounds normal; no masses,  no organomegaly Extremities: extremities normal, atraumatic, no cyanosis or edema Wound: clean and dry  Lab Results: Recent Labs    03/06/18 0655  WBC 7.4  HGB 11.4*  HCT 34.1*  PLT 205   BMET:  Recent Labs    03/06/18 0655  NA 135  K 3.7  CL 100*  CO2 24  GLUCOSE 121*  BUN 16  CREATININE 1.01  CALCIUM 8.3*    PT/INR: No results for input(s): LABPROT, INR in the last 72 hours. ABG    Component Value Date/Time   PHART 7.336 (L) 03/02/2018 0533   HCO3 21.6 03/02/2018 0533   TCO2 23 03/02/2018 1622   ACIDBASEDEF 4.0 (H) 03/02/2018 0533   O2SAT 98.0 03/02/2018 0533   CBG (last 3)  Recent Labs    03/04/18 2138 03/05/18 0738 03/05/18 1207  GLUCAP 104* 113* 99    Assessment/Plan: S/P Procedure(s)  (LRB): CORONARY ARTERY BYPASS GRAFTING (CABG) times three using the right saphaneous vein. Harvested endoscopicly; and left internal mammary artery. (N/A) TRANSESOPHAGEAL ECHOCARDIOGRAM (TEE) (N/A)  1. CV-HR is NSR in the 80s. Holding BB.  BP improving, will discontinue midodrine . Tolerating  ASA, and simvastatin.  2. Pulm-Good oxygen saturation on room air. CXR yesterday showed small bilateral pleural effusions and mild left lung atelectasis.  3. Renal-creatinine 1.01, electrolytes okay. Weight continues to trend down.  4. H and H has been stable 5. Blood glucose level well controlled.  Plan: Discontinue midodrine and discharge later today if BP remains stable.    LOS: 6 days    Vincent Stevens 03/07/2018   Chart reviewed, patient examined, agree with above. His BP is fine this am so he can go home off Midodrine.

## 2018-03-07 NOTE — Plan of Care (Signed)
Care plan reviewed and patient is adequate for discharge.  

## 2018-03-08 ENCOUNTER — Telehealth (HOSPITAL_COMMUNITY): Payer: Self-pay

## 2018-03-08 NOTE — Telephone Encounter (Signed)
Patients insurance is active and benefits verified through [facility] - $10.00 co-pay, no deductible, out of pocket amount of $3,400/$131.85 has been met, no co-insurance, and no pre-authorization is required. Passport/reference [id number]  Will contact patient to see if interested in the CR program. If interested, patient will need to complete follow up appt. Once completed, patient will be contacted for scheduling upon review by the RN Navigator.

## 2018-03-09 ENCOUNTER — Telehealth (HOSPITAL_COMMUNITY): Payer: Self-pay

## 2018-03-09 NOTE — Telephone Encounter (Signed)
Called patient to see if he is interested in the Cardiac Rehab Program - Patient is interested. Explained scheduling process and patient stated he understands. Went over insurance with patient and patient verbally stated he understands what he is responsible for. Will contact patient for scheduling once follow up appt has been completed.

## 2018-03-10 ENCOUNTER — Ambulatory Visit: Payer: Medicare HMO | Admitting: Cardiology

## 2018-03-13 ENCOUNTER — Ambulatory Visit (INDEPENDENT_AMBULATORY_CARE_PROVIDER_SITE_OTHER): Payer: Self-pay | Admitting: *Deleted

## 2018-03-13 DIAGNOSIS — I2510 Atherosclerotic heart disease of native coronary artery without angina pectoris: Secondary | ICD-10-CM

## 2018-03-13 DIAGNOSIS — Z4802 Encounter for removal of sutures: Secondary | ICD-10-CM

## 2018-03-13 DIAGNOSIS — I209 Angina pectoris, unspecified: Secondary | ICD-10-CM

## 2018-03-13 DIAGNOSIS — Z951 Presence of aortocoronary bypass graft: Secondary | ICD-10-CM

## 2018-03-13 NOTE — Progress Notes (Signed)
Mr. Golson returns s/p CABG X 3 03/01/18 and d/c'd 03/07/18. He is doing very well. Diet tolerated well and bowels are good. Has only taken a couple of Oxycodone when he initially came home. His sternal incision,  three chest tube and right lower leg EVH sites are all very well healed. The chest tube incisions were easily removed. He will see cardiology on 03/20/18 with follow up/chest xray here on 04/05/18.

## 2018-03-15 ENCOUNTER — Encounter: Payer: Self-pay | Admitting: Internal Medicine

## 2018-03-15 ENCOUNTER — Ambulatory Visit (INDEPENDENT_AMBULATORY_CARE_PROVIDER_SITE_OTHER): Payer: Medicare HMO | Admitting: Internal Medicine

## 2018-03-15 VITALS — BP 100/60 | HR 82 | Temp 98.0°F | Wt 193.0 lb

## 2018-03-15 DIAGNOSIS — I10 Essential (primary) hypertension: Secondary | ICD-10-CM | POA: Diagnosis not present

## 2018-03-15 DIAGNOSIS — N400 Enlarged prostate without lower urinary tract symptoms: Secondary | ICD-10-CM

## 2018-03-15 DIAGNOSIS — Z951 Presence of aortocoronary bypass graft: Secondary | ICD-10-CM

## 2018-03-15 DIAGNOSIS — I2510 Atherosclerotic heart disease of native coronary artery without angina pectoris: Secondary | ICD-10-CM

## 2018-03-15 DIAGNOSIS — L308 Other specified dermatitis: Secondary | ICD-10-CM | POA: Diagnosis not present

## 2018-03-15 DIAGNOSIS — E785 Hyperlipidemia, unspecified: Secondary | ICD-10-CM

## 2018-03-15 MED ORDER — TRIAMCINOLONE ACETONIDE 0.1 % EX CREA: 1.0000 "application " | TOPICAL_CREAM | Freq: Two times a day (BID) | CUTANEOUS | 2 refills | Status: DC

## 2018-03-15 NOTE — Patient Instructions (Signed)
Limit your sodium (Salt) intake  Cardiology follow-up as scheduled  Follow-up with cardiothoracic surgery as scheduled  Slowly resume your usual level of activity

## 2018-03-15 NOTE — Progress Notes (Signed)
   Subjective:    Patient ID: Vincent Stevens, male    DOB: 1946/02/08, 72 y.o.   MRN: [id number]  HPI    Review of Systems     Objective:   Physical Exam        Assessment & Plan:

## 2018-03-15 NOTE — Progress Notes (Signed)
Subjective:    Patient ID: Vincent Stevens, male    DOB: 06/23/1946, 72 y.o.   MRN: [id number]  HPI 72 year old patient who is seen today post hospital follow-up.  He was discharged from the hospital 8 days ago following CABG x3.  He has done well post discharge with only some minimal chest wall soreness. Hospital records reviewed Discharge medications reviewed  Patient does have a history of essential hypertension but more recently blood pressure has been running in a low normal range.  Amlodipine has been discontinued.  Remains on aspirin and simvastatin as well as Flomax for BPH.  Very pleased with his postop progress  Past Medical History:  Diagnosis Date  . Allergy   . Benign prostatic hypertrophy   . BENIGN PROSTATIC HYPERTROPHY 09/19/2009   Qualifier: Diagnosis of  By: Burnice Logan  MD, Doretha Sou   . Chest pain 02/23/2018  . Dyslipidemia 08/14/2010   Qualifier: Diagnosis of  By: Burnice Logan  MD, Doretha Sou   . Essential hypertension 01/17/2009   Qualifier: Diagnosis of  By: Burnice Logan  MD, Doretha Sou   . Gout   . GOUT, ACUTE 11/07/2009   Qualifier: Diagnosis of  By: Burnice Logan  MD, Doretha Sou   . History of colonic polyps 09/27/2008   Qualifier: Diagnosis of  By: Burnice Logan  MD, Doretha Sou  Initial colonoscopy 2003.  Hyperplastic polyps only Followup colonoscopy 2008.  Normal  10 year interval recommended   . Hyperlipidemia   . Hypertension   . Impaired glucose tolerance 02/20/2013  . Left main coronary artery disease   . SOB (shortness of breath) 02/23/2018     Social History   Socioeconomic History  . Marital status: Single    Spouse name: Not on file  . Number of children: Not on file  . Years of education: Not on file  . Highest education level: Not on file  Occupational History  . Not on file  Social Needs  . Financial resource strain: Not on file  . Food insecurity:    Worry: Not on file    Inability: Not on file  . Transportation needs:    Medical: Not on file   Non-medical: Not on file  Tobacco Use  . Smoking status: Former Smoker    Last attempt to quit: 11/29/1980    Years since quitting: 37.3  . Smokeless tobacco: Never Used  Substance and Sexual Activity  . Alcohol use: No  . Drug use: No  . Sexual activity: Not on file  Lifestyle  . Physical activity:    Days per week: Not on file    Minutes per session: Not on file  . Stress: Not on file  Relationships  . Social connections:    Talks on phone: Not on file    Gets together: Not on file    Attends religious service: Not on file    Active member of club or organization: Not on file    Attends meetings of clubs or organizations: Not on file    Relationship status: Not on file  . Intimate partner violence:    Fear of current or ex partner: Not on file    Emotionally abused: Not on file    Physically abused: Not on file    Forced sexual activity: Not on file  Other Topics Concern  . Not on file  Social History Narrative  . Not on file    Past Surgical History:  Procedure Laterality Date  . CORONARY ARTERY BYPASS GRAFT  N/A 03/01/2018   Procedure: CORONARY ARTERY BYPASS GRAFTING (CABG) times three using the right saphaneous vein. Harvested endoscopicly; and left internal mammary artery.;  Surgeon: Gaye Pollack, MD;  Location: [facility];  Service: Open Heart Surgery;  Laterality: N/A;  . HERNIA REPAIR    . LEFT HEART CATH AND CORONARY ANGIOGRAPHY N/A 03/01/2018   Procedure: LEFT HEART CATH AND CORONARY ANGIOGRAPHY;  Surgeon: Leonie Man, MD;  Location: [facility];  Service: Cardiovascular;  Laterality: N/A;  . TEE WITHOUT CARDIOVERSION N/A 03/01/2018   Procedure: TRANSESOPHAGEAL ECHOCARDIOGRAM (TEE);  Surgeon: Gaye Pollack, MD;  Location: [facility];  Service: Open Heart Surgery;  Laterality: N/A;    Family History  Problem Relation Age of Onset  . Diabetes Unknown   . Hypertension Unknown   . Heart attack Father   . Dementia Mother   . Cancer Sister     Allergies    Allergen Reactions  . Sulfamethoxazole Hives and Itching    Current Outpatient Medications on File Prior to Visit  Medication Sig Dispense Refill  . aspirin EC 325 MG EC tablet Take 1 tablet (325 mg total) by mouth daily. 30 tablet 0  . Multiple Vitamin (MULTIVITAMIN) tablet Take 1 tablet by mouth daily. 90 tablet 3  . simvastatin (ZOCOR) 20 MG tablet Take 1 tablet (20 mg total) by mouth at bedtime. 90 tablet 3  . tamsulosin (FLOMAX) 0.4 MG CAPS capsule Take 1 capsule (0.4 mg total) by mouth daily. 90 capsule 3   No current facility-administered medications on file prior to visit.     BP 100/60 (BP Location: Right Arm, Patient Position: Sitting, Cuff Size: Large)   Pulse 82   Temp 98 F (36.7 C) (Oral)   Wt 193 lb (87.5 kg)   SpO2 99%   BMI 29.35 kg/m      Review of Systems  Constitutional: Negative for appetite change, chills, fatigue and fever.  HENT: Negative for congestion, dental problem, ear pain, hearing loss, sore throat, tinnitus, trouble swallowing and voice change.   Eyes: Negative for pain, discharge and visual disturbance.  Respiratory: Negative for cough, chest tightness, wheezing and stridor.   Cardiovascular: Negative for chest pain, palpitations and leg swelling.  Gastrointestinal: Negative for abdominal distention, abdominal pain, blood in stool, constipation, diarrhea, nausea and vomiting.  Genitourinary: Negative for difficulty urinating, discharge, flank pain, genital sores, hematuria and urgency.  Musculoskeletal: Negative for arthralgias, back pain, gait problem, joint swelling, myalgias and neck stiffness.  Skin: Positive for rash.  Neurological: Negative for dizziness, syncope, speech difficulty, weakness, numbness and headaches.  Hematological: Negative for adenopathy. Does not bruise/bleed easily.  Psychiatric/Behavioral: Negative for behavioral problems and dysphoric mood. The patient is not nervous/anxious.        Objective:   Physical Exam   Constitutional: He is oriented to person, place, and time. He appears well-developed.  HENT:  Head: Normocephalic.  Right Ear: External ear normal.  Left Ear: External ear normal.  Eyes: Conjunctivae and EOM are normal.  Neck: Normal range of motion.  Cardiovascular: Normal rate and normal heart sounds.  Pulmonary/Chest: Breath sounds normal.  Nicely healing sternotomy scar  Abdominal: Bowel sounds are normal.  Musculoskeletal: Normal range of motion. He exhibits no edema or tenderness.  Neurological: He is alert and oriented to person, place, and time.  Skin: Rash noted.  Scaly slightly excoriated rash right upper lateral leg area Resolving ecchymoses  left upper inner thigh  Psychiatric: He has a normal mood and  affect. His behavior is normal.          Assessment & Plan:   Coronary artery disease status post CABG History of essential hypertension.  Blood pressure presently remains in a low normal range off therapy BPH continue Flomax Nonspecific pruritic dermatitis right upper lateral thigh.  Will treat with short-term triamcinolone Continue aspirin and statin therapy   Cardiology and cardiothoracic surgical follow-up  Nyoka Cowden

## 2018-03-19 NOTE — Progress Notes (Signed)
Cardiology Office Note:    Date:  03/20/2018   [name], DOB 15-Sep-1946, MRN [id number]  PCP:  Vincent Lor, MD  Cardiologist:  Vincent More, MD    Referring MD: Vincent Lor, MD    ASSESSMENT:    1. Coronary artery disease involving native coronary artery of native heart with angina pectoris ([place])   2. Dyslipidemia    PLAN:    In order of problems listed above:  1. Improved after bypass surgery.  He truly has progressed quickly and is awaiting seen CT surgery before initiating cardiac rehabilitation.  I will draw CBC and a BMP today along with lipid profile continue current medical treatment with aspirin and a statin.  If LDL is greater than 70 we could place him either on a high intensity statin or combined treatment with ezetamide.  I will plan to see him back in the office in 3 months. 2. Check lipid profile may require combined therapy to achieve LDL goal   Next appointment: 3 months   Medication Adjustments/Labs and Tests Ordered: Current medicines are reviewed at length with the patient today.  Concerns regarding medicines are outlined above.  No orders of the defined types were placed in this encounter.  No orders of the defined types were placed in this encounter.   Chief Complaint  Patient presents with  . Follow-up    after CABG    History of Present Illness:    Vincent Stevens is a 72 y.o. male with a hx of  exertional SOB and chest discomfort  last seen by me 02/24/18 and referred to coronary angiography.  LEFT HEART CATH AND CORONARY ANGIOGRAPHY   03/01/18  Conclusion:   The left ventricular systolic function is normal.  LV end diastolic pressure is normal.  The left ventricular ejection fraction is 55-65% by visual estimate.  There is no mitral valve regurgitation.  There is no aortic valve stenosis.  There is no mitral valve regurgitation.  Ost RPDA lesion is 70% stenosed.  RPDA lesion is 70% stenosed.  Prox  Cx lesion is 75% stenosed.  Ost 1st Mrg lesion is 55% stenosed.  Dist Cx lesion is 35% stenosed.  Mid LM to Ost LAD lesion is 95% stenosed.  Ost Cx lesion is 95% stenosed.  Prox LAD lesion is 85% stenosed. Severe multivessel disease including left main-LAD-circumflex 95% (1,1,1) with 90%pLAD, p80%Cx and tandem 70% ost-p RDPA. Relatively normal LVEF and LVEDP. Plan: After IABP pump placed, the patient will be taken directly to the operating room for urgent/emergent multivessel CABG with Vincent Stevens.   Admit date: 03/01/2018 Discharge date: 03/07/2018 Admission Diagnoses:     Patient Active Problem List   Diagnosis Date Noted  . Angina, class III ([place]) 03/01/2018  . Left main coronary artery disease   . Chest pain 02/23/2018  . SOB (shortness of breath) 02/23/2018  . Impaired glucose tolerance 02/20/2013  . Dyslipidemia 08/14/2010  . GOUT, ACUTE 11/07/2009  . BENIGN PROSTATIC HYPERTROPHY 09/19/2009  . Essential hypertension 01/17/2009  . History of colonic polyps 09/27/2008   Discharge Diagnoses:      Patient Active Problem List   Diagnosis Date Noted  . Angina, class III ([place]) 03/01/2018  . S/P CABG x 3 03/01/2018  . Left main coronary artery disease   . Chest pain 02/23/2018  . SOB (shortness of breath) 02/23/2018  . Impaired glucose tolerance 02/20/2013  . Dyslipidemia 08/14/2010  . GOUT, ACUTE 11/07/2009  . BENIGN PROSTATIC HYPERTROPHY  09/19/2009  . Essential hypertension 01/17/2009  . History of colonic polyps 09/27/2008   Vincent Stevens is a 72 yo male with history of Hypertension and Hyperlipidemia.  The patient had been in his normal state of health until December when he was diagnosed with Bronchitis.  Since that time he developed substernal chest discomfort with radiation into both shoulders.  This was mainly associated with exertion.  This was associated with shortness of breath.  Symptoms relieved with rest, but have been occurring on a daily basis.  He was  evaluated by Dr. Bettina Stevens on 02/24/2018 who felt the patient should undergo cardiac catheterization with possible PCI.  This was performed on 03/01/2018 by Dr. Ellyn Stevens and revealed severe 3 vessel CAD with LM involvement.  He required placement of Intra Aortic Balloon Pump and emergent coronary bypass grafting was indicated and TCTS consult was obtained.  The patient was evaluated by Vincent Stevens who was in agreement the patient would benefit from emergent bypass surgery.  The risks and benefits of the procedure were explained to the patient and he was agreeable to proceed. Hospital Course:  Vincent Stevens was taken emergently to the operating room.  He underwent CABG x 3 utilizing LIMA to LAD, SVG to OM, and SVG to PDA.  He also underwent endoscopic harvest of greater saphenous vein from right leg.  He tolerating the procedure without difficulty and was taken to the SICU in stable condition.  During his stay in the SICU the patient was weaned and extubated on POD #1. His chest tubes were removed without difficulty.  His balloon pump and right radial artery sheath were removed without difficulty. He continued to progress while in the ICU and was transferred to the telemetry unit for continued care on  03/06/2018. We were weaning his midodrine and trending his blood pressure carefully. We discontinued his epicardial pacing wires. We held beta blockade due to some episodes of bradycardia and borderline blood pressure. He continued to ambulate with limited assistance, his incision was healing well, he was on room air and ready for discharge home with his family.   Compliance with diet, lifestyle and medications: Yes Made a quick recovery less than 3 weeks after bypass and is active and has minimal incisional pain no fever chills shortness of breath or angina.  He is now walking every day and look forward to cardiac rehabilitation. Past Medical History:  Diagnosis Date  . Allergy   . Benign prostatic hypertrophy   . BENIGN  PROSTATIC HYPERTROPHY 09/19/2009   Qualifier: Diagnosis of  By: Vincent Logan  MD, Vincent Stevens   . Chest pain 02/23/2018  . Dyslipidemia 08/14/2010   Qualifier: Diagnosis of  By: Vincent Logan  MD, Vincent Stevens   . Essential hypertension 01/17/2009   Qualifier: Diagnosis of  By: Vincent Logan  MD, Vincent Stevens   . Gout   . GOUT, ACUTE 11/07/2009   Qualifier: Diagnosis of  By: Vincent Logan  MD, Vincent Stevens   . History of colonic polyps 09/27/2008   Qualifier: Diagnosis of  By: Vincent Logan  MD, Vincent Stevens  Initial colonoscopy 2003.  Hyperplastic polyps only Followup colonoscopy 2008.  Normal  10 year interval recommended   . Hyperlipidemia   . Hypertension   . Impaired glucose tolerance 02/20/2013  . Left main coronary artery disease   . SOB (shortness of breath) 02/23/2018    Past Surgical History:  Procedure Laterality Date  . CORONARY ARTERY BYPASS GRAFT N/A 03/01/2018   Procedure: CORONARY ARTERY BYPASS GRAFTING (CABG) times three  using the right saphaneous vein. Harvested endoscopicly; and left internal mammary artery.;  Surgeon: Gaye Pollack, MD;  Location: [facility];  Service: Open Heart Surgery;  Laterality: N/A;  . HERNIA REPAIR    . LEFT HEART CATH AND CORONARY ANGIOGRAPHY N/A 03/01/2018   Procedure: LEFT HEART CATH AND CORONARY ANGIOGRAPHY;  Surgeon: Leonie Man, MD;  Location: [facility];  Service: Cardiovascular;  Laterality: N/A;  . TEE WITHOUT CARDIOVERSION N/A 03/01/2018   Procedure: TRANSESOPHAGEAL ECHOCARDIOGRAM (TEE);  Surgeon: Gaye Pollack, MD;  Location: [facility];  Service: Open Heart Surgery;  Laterality: N/A;    Current Medications: Current Meds  Medication Sig  . aspirin EC 325 MG EC tablet Take 1 tablet (325 mg total) by mouth daily.  . Multiple Vitamin (MULTIVITAMIN) tablet Take 1 tablet by mouth daily.  . simvastatin (ZOCOR) 20 MG tablet Take 1 tablet (20 mg total) by mouth at bedtime.  . tamsulosin (FLOMAX) 0.4 MG CAPS capsule Take 1 capsule (0.4 mg total) by mouth daily.  Marland Kitchen  triamcinolone cream (KENALOG) 0.1 % Apply 1 application topically 2 (two) times daily.     Allergies:   Sulfamethoxazole   Social History   Socioeconomic History  . Marital status: Single    Spouse name: Not on file  . Number of children: Not on file  . Years of education: Not on file  . Highest education level: Not on file  Occupational History  . Not on file  Social Needs  . Financial resource strain: Not on file  . Food insecurity:    Worry: Not on file    Inability: Not on file  . Transportation needs:    Medical: Not on file    Non-medical: Not on file  Tobacco Use  . Smoking status: Former Smoker    Last attempt to quit: 11/29/1980    Years since quitting: 37.3  . Smokeless tobacco: Never Used  Substance and Sexual Activity  . Alcohol use: No  . Drug use: No  . Sexual activity: Not on file  Lifestyle  . Physical activity:    Days per week: Not on file    Minutes per session: Not on file  . Stress: Not on file  Relationships  . Social connections:    Talks on phone: Not on file    Gets together: Not on file    Attends religious service: Not on file    Active member of club or organization: Not on file    Attends meetings of clubs or organizations: Not on file    Relationship status: Not on file  Other Topics Concern  . Not on file  Social History Narrative  . Not on file     Family History: The patient's family history includes Cancer in his sister; Dementia in his mother; Diabetes in his unknown relative; Heart attack in his father; Hypertension in his unknown relative. ROS:   Please see the history of present illness.    All other systems reviewed and are negative.  EKGs/Labs/Other Studies Reviewed:    The following studies were reviewed today  Recent Labs: 06/24/2017: ALT 16; TSH 2.71 03/02/2018: Magnesium 2.6 03/06/2018: BUN 16; Creatinine, Ser 1.01; Hemoglobin 11.4; Platelets 205; Potassium 3.7; Sodium 135  Recent Lipid Panel    Component Value  Date/Time   CHOL 170 06/24/2017 1226   TRIG 104.0 06/24/2017 1226   HDL 49.00 06/24/2017 1226   CHOLHDL 3 06/24/2017 1226   VLDL 20.8 06/24/2017 1226  LDLCALC 100 (H) 06/24/2017 1226    Physical Exam:    VS:  BP 108/76 (BP Location: Right Arm, Patient Position: Sitting, Cuff Size: Normal)   Pulse 78   Ht 5\' 8"  (1.727 m)   Wt 195 lb 6.4 oz (88.6 kg)   SpO2 98%   BMI 29.71 kg/m     Wt Readings from Last 3 Encounters:  03/20/18 195 lb 6.4 oz (88.6 kg)  03/15/18 193 lb (87.5 kg)  03/07/18 202 lb 11.2 oz (91.9 kg)     GEN:  Well nourished, well developed in no acute distress HEENT: Normal NECK: No JVD; No carotid bruits LYMPHATICS: No lymphadenopathy CARDIAC: Sternum is healed no click no wound drainage RRR, no murmurs, rubs, gallops RESPIRATORY:  Clear to auscultation without rales, wheezing or rhonchi  ABDOMEN: Soft, non-tender, non-distended MUSCULOSKELETAL:  No edema; No deformity  SKIN: Warm and dry NEUROLOGIC:  Alert and oriented x 3 PSYCHIATRIC:  Normal affect    Signed, Vincent More, MD  03/20/2018 3:34 PM    [facility]

## 2018-03-20 ENCOUNTER — Ambulatory Visit: Payer: Medicare HMO | Admitting: Cardiology

## 2018-03-20 ENCOUNTER — Encounter: Payer: Self-pay | Admitting: *Deleted

## 2018-03-20 VITALS — BP 108/76 | HR 78 | Ht 68.0 in | Wt 195.4 lb

## 2018-03-20 DIAGNOSIS — I209 Angina pectoris, unspecified: Secondary | ICD-10-CM

## 2018-03-20 DIAGNOSIS — I10 Essential (primary) hypertension: Secondary | ICD-10-CM

## 2018-03-20 DIAGNOSIS — E785 Hyperlipidemia, unspecified: Secondary | ICD-10-CM | POA: Diagnosis not present

## 2018-03-20 DIAGNOSIS — I25119 Atherosclerotic heart disease of native coronary artery with unspecified angina pectoris: Secondary | ICD-10-CM

## 2018-03-20 DIAGNOSIS — R0602 Shortness of breath: Secondary | ICD-10-CM | POA: Diagnosis not present

## 2018-03-20 NOTE — Patient Instructions (Addendum)
Medication Instructions:  Your physician recommends that you continue on your current medications as directed. Please refer to the Current Medication list given to you today.   Labwork: Your physician recommends that you have lab work today:  CBC, BMP, and lipids   Testing/Procedures: NONE   Follow-Up: Your physician wants you to follow-up in: 3 months. You will receive a reminder letter in the mail two months in advance. If you don't receive a letter, please call our office to schedule the follow-up appointment.   Any Other Special Instructions Will Be Listed Below (If Applicable).     If you need a refill on your cardiac medications before your next appointment, please call your pharmacy.   Cardiac Rehabilitation What is cardiac rehabilitation? Cardiac rehabilitation is a treatment program that helps improve the health and well-being of people who have heart problems. Cardiac rehabilitation includes exercise training, education, and counseling to help you get stronger and return to an active lifestyle. This program can help you get better faster and reduce any future hospital stays. Why might I need cardiac rehabilitation?  Cardiac rehabilitation programs can help when you have or have had:  A heart attack.  Heart failure.  Peripheral artery disease.  Coronary artery disease.  Angina.  Lung or breathing problems.  Cardiac rehabilitation programs are also used when you have had:  Coronary artery bypass graft surgery.  Heart valve replacement.  Heart stent placement.  Heart transplant.  Aneurysm repair.  What are the benefits of cardiac rehabilitation? Cardiac rehabilitation can help:  Reduce problems like chest pain and trouble breathing.  Change risk factors that contribute to heart disease, such as: ? Smoking. ? High blood pressure. ? High cholesterol. ? Diabetes. ? Being out of shape or not active. ? Weighing more than 30% higher than your ideal  weight. ? Diet.  Improve your mental outlook so you feel: ? More hopeful. ? Better about yourself. ? More confident about taking care of yourself.  Get support from health experts as well as other people with similar problems.  Learn how to manage and understand your medicines.  Teach your family about your condition and how to participate in your recovery.  What happens in cardiac rehabilitation? You will be assessed by a cardiac rehabilitation team. They will check your health history and do a physical exam. You may need blood tests, stress tests, and other evaluations to make sure that you are ready to start cardiac rehabilitation. The cardiac rehabilitation team works with you to make a plan based on your health and goals. Your program will be tailored to fit you and your needs and may change as you progress. You may work with a health care team that includes:  Doctors.  Nurses.  Dietitians.  Psychologists.  Exercise specialists.  Physical and occupational therapists.  What are the phases of cardiac rehabilitation? A cardiac rehabilitation program is often divided into phases. You advance from one phase to the next. Phase One This phase starts while you are still in the hospital. You may start by walking in your room and then in the hall. You may start some simple exercises with a therapist. Phase Two This phase begins when you go home or to another facility. This phase may last 8-12 weeks. You will travel to a cardiac rehabilitation center or another place where rehabilitation is offered. You will slowly increase your activity level while being closely watched by a nurse or therapist. Exercises may include a combination of strength or resistance  training and "cardio" or aerobic movement on a treadmill or other machines. Your condition will determine how often and how long these sessions last. In phase two, you may learn how to cook healthy meals, control your blood sugar, and  manage your medicines. You may need help with scheduling or planning how and when to take your medicines. If you have questions about your medicines, it is very important that you talk to your health care provider. Phase Three This phase continues for the rest of your life. There will be less supervision. You may still participate in cardiac rehabilitation activities or become part of a group in your community. You may benefit from talking about your experience with other people who are facing similar challenges. Get help right away if:  You have severe chest discomfort, especially if the pain is crushing or pressure-like and spreads to your arms, back, neck, or jaw. Do not wait to see if the pain will go away.  You have weakness or numbness in your face, arms, or legs, especially on one side of the body.  Your speech is slurred.  You are confused.  You have a sudden severe headache or loss of vision.  You have shortness of breath.  You are sweating and have nausea.  You feel dizzy or faint.  You are fatigued. These symptoms may represent a serious problem that is an emergency. Do not wait to see if the symptoms will go away. Get medical help right away. Call your local emergency services (911 in the [place] Do not drive yourself to the hospital. This information is not intended to replace advice given to you by your health care provider. Make sure you discuss any questions you have with your health care provider. Document Released: 08/24/2008 Document Revised: 11/01/2016 Document Reviewed: 09/29/2015 Elsevier Interactive Patient Education  2018 [employer].

## 2018-03-21 ENCOUNTER — Telehealth: Payer: Self-pay | Admitting: *Deleted

## 2018-03-21 DIAGNOSIS — E785 Hyperlipidemia, unspecified: Secondary | ICD-10-CM

## 2018-03-21 DIAGNOSIS — I10 Essential (primary) hypertension: Secondary | ICD-10-CM

## 2018-03-21 LAB — BASIC METABOLIC PANEL
BUN / CREAT RATIO: 14 (ref 10–24)
BUN: 14 mg/dL (ref 8–27)
CO2: 23 mmol/L (ref 20–29)
CREATININE: 0.98 mg/dL (ref 0.76–1.27)
Calcium: 9.8 mg/dL (ref 8.6–10.2)
Chloride: 100 mmol/L (ref 96–106)
GFR calc Af Amer: 88 mL/min/{1.73_m2} (ref >59)
GFR, EST NON AFRICAN AMERICAN: 76 mL/min/{1.73_m2} (ref >59)
Glucose: 98 mg/dL (ref 65–99)
Potassium: 5.6 mmol/L — ABNORMAL HIGH (ref 3.5–5.2)
SODIUM: 139 mmol/L (ref 134–144)

## 2018-03-21 LAB — LIPID PANEL
CHOLESTEROL TOTAL: 144 mg/dL (ref 100–199)
Chol/HDL Ratio: 3.6 ratio (ref 0.0–5.0)
HDL: 40 mg/dL (ref >39)
LDL Calculated: 78 mg/dL (ref 0–99)
Triglycerides: 129 mg/dL (ref 0–149)
VLDL CHOLESTEROL CAL: 26 mg/dL (ref 5–40)

## 2018-03-21 LAB — CBC
HEMATOCRIT: 42.5 % (ref 37.5–51.0)
Hemoglobin: 13.9 g/dL (ref 13.0–17.7)
MCH: 29 pg (ref 26.6–33.0)
MCHC: 32.7 g/dL (ref 31.5–35.7)
MCV: 89 fL (ref 79–97)
PLATELETS: 544 10*3/uL — AB (ref 150–379)
RBC: 4.8 x10E6/uL (ref 4.14–5.80)
RDW: 14.4 % (ref 12.3–15.4)
WBC: 6.2 10*3/uL (ref 3.4–10.8)

## 2018-03-21 MED ORDER — ROSUVASTATIN CALCIUM 20 MG PO TABS: 20.0000 mg | ORAL_TABLET | Freq: Every day | ORAL | 0 refills | Status: DC

## 2018-03-21 MED ORDER — ROSUVASTATIN CALCIUM 20 MG PO TABS: 20.0000 mg | ORAL_TABLET | Freq: Every day | ORAL | 3 refills | Status: DC

## 2018-03-21 NOTE — Telephone Encounter (Signed)
-----   Message from Richardo Priest, MD sent at 03/21/2018  7:24 AM EDT ----- LDL is quite high, stop simvastatin, new rosuvastatin 20 mg a day, recheck lipids and K in 1 month

## 2018-03-21 NOTE — Telephone Encounter (Signed)
Patient informed of results. Advised patient to stop taking simvastatin 20 mg daily and start rosuvastatin 20 mg daily. Advised patient to come back to our office in 1 month for follow up lab work. Patient verbalized understanding. Sent in new prescription to [facility]. No further questions.

## 2018-03-31 ENCOUNTER — Telehealth (HOSPITAL_COMMUNITY): Payer: Self-pay

## 2018-03-31 NOTE — Telephone Encounter (Signed)
Attempted to call patient in regards to Cardiac Rehab - lm on vm °

## 2018-04-05 ENCOUNTER — Ambulatory Visit: Payer: Medicare HMO | Admitting: Surgery

## 2018-04-05 ENCOUNTER — Other Ambulatory Visit: Payer: Self-pay | Admitting: Surgery

## 2018-04-05 DIAGNOSIS — Z951 Presence of aortocoronary bypass graft: Secondary | ICD-10-CM

## 2018-04-10 ENCOUNTER — Ambulatory Visit
Admission: RE | Admit: 2018-04-10 | Discharge: 2018-04-10 | Disposition: A | Discharge status: Home / Self Care | Payer: Medicare HMO | Source: Ambulatory Visit | Attending: Surgery | Admitting: Surgery

## 2018-04-10 ENCOUNTER — Ambulatory Visit (INDEPENDENT_AMBULATORY_CARE_PROVIDER_SITE_OTHER): Payer: Self-pay | Admitting: Physician Assistant

## 2018-04-10 VITALS — BP 113/74 | HR 76 | Resp 20 | Ht 68.0 in | Wt 197.0 lb

## 2018-04-10 DIAGNOSIS — I2510 Atherosclerotic heart disease of native coronary artery without angina pectoris: Secondary | ICD-10-CM

## 2018-04-10 DIAGNOSIS — Z951 Presence of aortocoronary bypass graft: Secondary | ICD-10-CM

## 2018-04-10 DIAGNOSIS — R0602 Shortness of breath: Secondary | ICD-10-CM | POA: Diagnosis not present

## 2018-04-10 NOTE — Progress Notes (Signed)
HPI: Patient returns for routine postoperative follow-up having undergone Emergent CABG x 3 on 03/01/2018. The patient's early postoperative recovery while in the hospital was notable for Bradycardia and Hypotension requiring treatment with Midodrine. Since hospital discharge the patient reports he is doing well.  He states he feels good and is walking without difficulty.  He states his Cardiologist changed his cholesterol medication at his last visit and he has to follow up with him in about a month.  He has not yet started Cardiac rehab, but is anxious to get started.  He asks if he can resume driving and return to work on light duty.  Current Outpatient Medications  Medication Sig Dispense Refill  . aspirin EC 325 MG EC tablet Take 1 tablet (325 mg total) by mouth daily. 30 tablet 0  . Multiple Vitamin (MULTIVITAMIN) tablet Take 1 tablet by mouth daily. 90 tablet 3  . rosuvastatin (CRESTOR) 20 MG tablet Take 1 tablet (20 mg total) by mouth daily. 90 tablet 3  . tamsulosin (FLOMAX) 0.4 MG CAPS capsule Take 1 capsule (0.4 mg total) by mouth daily. 90 capsule 3  . triamcinolone cream (KENALOG) 0.1 % Apply 1 application topically 2 (two) times daily. 45 g 2   No current facility-administered medications for this visit.     Physical Exam:  BP 113/74   Pulse 76   Resp 20   Ht 5\' 8"  (1.727 m)   Wt 197 lb (89.4 kg)   BMI 29.95 kg/m   Gen: no apparent distress Heart: RRR Lungs; CTA bilaterally Ext: no edema Incisions: well healed  Diagnostic Tests:  CXR: no pneumothorax, stable post surgical changes  A/P:  1. S/P Emergent CABG x 3 doing well- his HR and BP have improved.  He is not currently on a BB, but may be able to trial a low dose soon.. Will defer to Cardiology 2. Okay to start Cardiac rehab 3. Activity- may resume driving, increase activity as tolerated with continued observation of sternal precautions 4. RTC prn  Ellwood Handler, PA-C [facility] [phone]

## 2018-04-10 NOTE — Patient Instructions (Signed)

## 2018-04-11 ENCOUNTER — Encounter (HOSPITAL_COMMUNITY): Payer: Self-pay | Admitting: Cardiology

## 2018-04-19 DIAGNOSIS — E785 Hyperlipidemia, unspecified: Secondary | ICD-10-CM | POA: Diagnosis not present

## 2018-04-19 DIAGNOSIS — I10 Essential (primary) hypertension: Secondary | ICD-10-CM | POA: Diagnosis not present

## 2018-04-20 ENCOUNTER — Telehealth: Payer: Self-pay

## 2018-04-20 LAB — LIPID PANEL
CHOL/HDL RATIO: 3.1 ratio (ref 0.0–5.0)
Cholesterol, Total: 134 mg/dL (ref 100–199)
HDL: 43 mg/dL (ref >39)
LDL CALC: 70 mg/dL (ref 0–99)
Triglycerides: 105 mg/dL (ref 0–149)
VLDL Cholesterol Cal: 21 mg/dL (ref 5–40)

## 2018-04-20 LAB — BASIC METABOLIC PANEL
BUN/Creatinine Ratio: 15 (ref 10–24)
BUN: 14 mg/dL (ref 8–27)
CHLORIDE: 104 mmol/L (ref 96–106)
CO2: 22 mmol/L (ref 20–29)
Calcium: 9.3 mg/dL (ref 8.6–10.2)
Creatinine, Ser: 0.94 mg/dL (ref 0.76–1.27)
GFR calc Af Amer: 93 mL/min/{1.73_m2} (ref >59)
GFR calc non Af Amer: 80 mL/min/{1.73_m2} (ref >59)
GLUCOSE: 102 mg/dL — AB (ref 65–99)
POTASSIUM: 4.8 mmol/L (ref 3.5–5.2)
Sodium: 141 mmol/L (ref 134–144)

## 2018-04-20 NOTE — Telephone Encounter (Signed)
Left message for patient to return call for lab results. 

## 2018-04-21 ENCOUNTER — Telehealth: Payer: Self-pay

## 2018-04-21 NOTE — Telephone Encounter (Signed)
Spoke with patients wife Arbie Cookey on phone to notify of stable lab results per Dr Bettina Gavia. Patients wife will also look on MyChart for results.

## 2018-05-02 ENCOUNTER — Telehealth (HOSPITAL_COMMUNITY): Payer: Self-pay

## 2018-05-02 NOTE — Telephone Encounter (Signed)
2nd attempt to call patient in regards to Cardiac Rehab - lm on vm. Sending letter. °

## 2018-05-17 ENCOUNTER — Telehealth (HOSPITAL_COMMUNITY): Payer: Self-pay

## 2018-05-17 NOTE — Telephone Encounter (Signed)
Called and spoke with patient to schedule Cardiac Rehab - Scheduled orientation on 06/15/18 at 7:30am. Patient will attend the 6:45am exc class. Went over insurance with patient and patient verbalized understanding. Mailed packet.

## 2018-06-07 ENCOUNTER — Telehealth (HOSPITAL_COMMUNITY): Payer: Self-pay

## 2018-06-07 NOTE — Telephone Encounter (Signed)
Cardiac Rehab Medication Review by a Pharmacist  Does the patient  feel that his/her medications are working for him/her?  yes  Has the patient been experiencing any side effects to the medications prescribed?  no  Does the patient measure his/her own blood pressure or blood glucose at home?  yes pt reports <120/80; 114/74   Does the patient have any problems obtaining medications due to transportation or finances?   yes  Understanding of regimen: good Understanding of indications: good Potential of compliance: excellent    Pharmacist comments: n/a    Vincent Stevens D PGY1 Pharmacy Resident  Phone [phone] 06/07/2018      3:41 PM

## 2018-06-15 ENCOUNTER — Encounter (HOSPITAL_COMMUNITY): Payer: Self-pay

## 2018-06-15 ENCOUNTER — Encounter (HOSPITAL_COMMUNITY)
Admission: RE | Admit: 2018-06-15 | Discharge: 2018-06-15 | Disposition: A | Discharge status: Home / Self Care | Payer: Medicare HMO | Source: Ambulatory Visit | Attending: Cardiology | Admitting: Cardiology

## 2018-06-15 VITALS — Ht 68.0 in | Wt 196.9 lb

## 2018-06-15 DIAGNOSIS — I10 Essential (primary) hypertension: Secondary | ICD-10-CM | POA: Insufficient documentation

## 2018-06-15 DIAGNOSIS — I2510 Atherosclerotic heart disease of native coronary artery without angina pectoris: Secondary | ICD-10-CM | POA: Diagnosis not present

## 2018-06-15 DIAGNOSIS — Z87891 Personal history of nicotine dependence: Secondary | ICD-10-CM | POA: Insufficient documentation

## 2018-06-15 DIAGNOSIS — Z951 Presence of aortocoronary bypass graft: Secondary | ICD-10-CM

## 2018-06-15 DIAGNOSIS — E785 Hyperlipidemia, unspecified: Secondary | ICD-10-CM | POA: Diagnosis not present

## 2018-06-15 NOTE — Progress Notes (Signed)
Vincent Stevens 72 y.o. male DOB: 06/24/1946 MRN: [id number]      Nutrition Note  1. 03/11/18 S/P CABG x 3    Past Medical History:  Diagnosis Date  . Allergy   . Benign prostatic hypertrophy   . BENIGN PROSTATIC HYPERTROPHY 09/19/2009   Qualifier: Diagnosis of  By: Burnice Logan  MD, Doretha Sou   . Chest pain 02/23/2018  . Dyslipidemia 08/14/2010   Qualifier: Diagnosis of  By: Burnice Logan  MD, Doretha Sou   . Essential hypertension 01/17/2009   Qualifier: Diagnosis of  By: Burnice Logan  MD, Doretha Sou   . Gout   . GOUT, ACUTE 11/07/2009   Qualifier: Diagnosis of  By: Burnice Logan  MD, Doretha Sou   . History of colonic polyps 09/27/2008   Qualifier: Diagnosis of  By: Burnice Logan  MD, Doretha Sou  Initial colonoscopy 2003.  Hyperplastic polyps only Followup colonoscopy 2008.  Normal  10 year interval recommended   . Hyperlipidemia   . Hypertension   . Impaired glucose tolerance 02/20/2013  . Left main coronary artery disease   . SOB (shortness of breath) 02/23/2018   Meds reviewed. MVI, crestor noted  HT: Ht Readings from Last 1 Encounters:  06/15/18 5\' 8"  (1.727 m)    WT: Wt Readings from Last 5 Encounters:  06/15/18 196 lb 13.9 oz (89.3 kg)  04/10/18 197 lb (89.4 kg)  03/20/18 195 lb 6.4 oz (88.6 kg)  03/15/18 193 lb (87.5 kg)  03/07/18 202 lb 11.2 oz (91.9 kg)     Body mass index is 29.93 kg/m.   Current tobacco use? no       Labs:  Lipid Panel     Component Value Date/Time   CHOL 134 04/19/2018 0810   TRIG 105 04/19/2018 0810   HDL 43 04/19/2018 0810   CHOLHDL 3.1 04/19/2018 0810   CHOLHDL 3 06/24/2017 1226   VLDL 20.8 06/24/2017 1226   LDLCALC 70 04/19/2018 0810    Lab Results  Component Value Date   HGBA1C 6.2 06/24/2017   CBG (last 3)  No results for input(s): GLUCAP in the last 72 hours.  Nutrition Note Spoke with pt. Nutrition plan and goals reviewed with pt. Pt is following Step 2 of the Therapeutic Lifestyle Changes diet. Pt wants to lose wt. Pt has been trying to  lose wt by eating smaller portions. Wt loss tips reviewed.    Pt is prediabetic. Last A1c 6.2. Discussed following a consistent carbohydrate heart healthy diet. Per discussion, pt does not use canned/convenience foods often. Pt rarely adds salt to food. Pt eats out infrequently.   Pt expressed understanding of the information reviewed. Pt aware of nutrition education classes offered and plans on attending nutrition classes as able.  Nutrition Diagnosis ? Food-and nutrition-related knowledge deficit related to lack of exposure to information as related to diagnosis of: ? CVD ? Pre-DM ? Overweight related to excessive energy intake as evidenced by a Body mass index is 29.93 kg/m.  Nutrition Intervention ? Pt's individual nutrition plan and goals reviewed with pt.  Nutrition Goal(s):   ? Pt to identify and limit food sources of saturated fat, trans fat, and sodium ? Pt to identify food quantities necessary to achieve weight loss of 6-24 lbs. at graduation from cardiac rehab. Goal wt of 10 lb desired.   Plan:  ? Pt to attend nutrition classes ? Nutrition I ? Nutrition II ? Portion Distortion  ? Will provide client-centered nutrition education as part of interdisciplinary care ? Monitor  and evaluate progress toward nutrition goal with team.   Laurina Bustle, MS, RD, LDN 06/15/2018 11:15 AM

## 2018-06-15 NOTE — Progress Notes (Signed)
Cardiac Individual Treatment Plan  Patient Details  Name: Vincent Stevens MRN: [id number] Date of Birth: [birth year] Referring Provider:     [place] from 06/15/2018 in [facility]  Referring Provider  [name]       Initial Encounter Date:    CARDIAC REHAB PHASE II ORIENTATION from 06/15/2018 in [facility]  Date  06/15/18      Visit Diagnosis: 03/11/18 S/P CABG x 3  Patient's Home Medications on Admission:  Current Outpatient Medications:  .  aspirin EC 325 MG EC tablet, Take 1 tablet (325 mg total) by mouth daily., Disp: 30 tablet, Rfl: 0 .  Multiple Vitamin (MULTIVITAMIN) tablet, Take 1 tablet by mouth daily., Disp: 90 tablet, Rfl: 3 .  rosuvastatin (CRESTOR) 20 MG tablet, Take 1 tablet (20 mg total) by mouth daily., Disp: 90 tablet, Rfl: 3 .  tamsulosin (FLOMAX) 0.4 MG CAPS capsule, Take 1 capsule (0.4 mg total) by mouth daily., Disp: 90 capsule, Rfl: 3 .  triamcinolone cream (KENALOG) 0.1 %, Apply 1 application topically 2 (two) times daily. (Patient not taking: Reported on 06/07/2018), Disp: 45 g, Rfl: 2  Past Medical History: Past Medical History:  Diagnosis Date  . Allergy   . Benign prostatic hypertrophy   . BENIGN PROSTATIC HYPERTROPHY 09/19/2009   Qualifier: Diagnosis of  By: Burnice Logan  MD, Doretha Sou   . Chest pain 02/23/2018  . Dyslipidemia 08/14/2010   Qualifier: Diagnosis of  By: Burnice Logan  MD, Doretha Sou   . Essential hypertension 01/17/2009   Qualifier: Diagnosis of  By: Burnice Logan  MD, Doretha Sou   . Gout   . GOUT, ACUTE 11/07/2009   Qualifier: Diagnosis of  By: Burnice Logan  MD, Doretha Sou   . History of colonic polyps 09/27/2008   Qualifier: Diagnosis of  By: Burnice Logan  MD, Doretha Sou  Initial colonoscopy 2003.  Hyperplastic polyps only Followup colonoscopy 2008.  Normal  10 year interval recommended   . Hyperlipidemia   . Hypertension   . Impaired glucose tolerance  02/20/2013  . Left main coronary artery disease   . SOB (shortness of breath) 02/23/2018    Tobacco Use: Social History   Tobacco Use  Smoking Status Former Smoker  . Last attempt to quit: 11/29/1980  . Years since quitting: 37.5  Smokeless Tobacco Never Used    Labs: Recent Review Flowsheet Data    Labs for ITP Cardiac and Pulmonary Rehab Latest Ref Rng & Units 03/02/2018 03/02/2018 03/02/2018 03/20/2018 04/19/2018   Cholestrol 100 - 199 mg/dL - - - 144 134   LDLCALC 0 - 99 mg/dL - - - 78 70   HDL >39 mg/dL - - - 40 43   Trlycerides 0 - 149 mg/dL - - - 129 105   Hemoglobin A1c 4.6 - 6.5 % - - - - -   PHART 7.350 - 7.450 7.336(L) 7.336(L) - - -   PCO2ART 32.0 - 48.0 mmHg 39.4 40.5 - - -   HCO3 20.0 - 28.0 mmol/L 21.0 21.6 - - -   TCO2 22 - 32 mmol/L 22 23 23  - -   ACIDBASEDEF 0.0 - 2.0 mmol/L 4.0(H) 4.0(H) - - -   O2SAT % 99.0 98.0 - - -      Capillary Blood Glucose: Lab Results  Component Value Date   GLUCAP 99 03/05/2018   GLUCAP 113 (H) 03/05/2018   GLUCAP 104 (H) 03/04/2018   GLUCAP 106 (  H) 03/04/2018   GLUCAP 110 (H) 03/04/2018     Exercise Target Goals: Date: 06/15/18  Exercise Program Goal: Individual exercise prescription set using results from initial 6 min walk test and THRR while considering  patient's activity barriers and safety.   Exercise Prescription Goal: Initial exercise prescription builds to 30-45 minutes a day of aerobic activity, 2-3 days per week.  Home exercise guidelines will be given to patient during program as part of exercise prescription that the participant will acknowledge.  Activity Barriers & Risk Stratification: Activity Barriers & Cardiac Risk Stratification - 06/15/18 0938      Activity Barriers & Cardiac Risk Stratification   Comments  B knee discomfort, L shoulder pain/limitations       6 Minute Walk: 6 Minute Walk    Row Name 06/15/18 0927         6 Minute Walk   Phase  Initial     Distance  1725 feet     Walk Time  6  minutes     # of Rest Breaks  0     MPH  3.26     METS  3.36     RPE  12     Perceived Dyspnea   0     VO2 Peak  11.77     Symptoms  Yes (comment)     Comments  Left Knee Pain +6     Resting HR  71 bpm     Resting BP  102/70     Resting Oxygen Saturation   97 %     Exercise Oxygen Saturation  during 6 min walk  96 %     Max Ex. HR  109 bpm     Max Ex. BP  122/80     2 Minute Post BP  122/70        Oxygen Initial Assessment:   Oxygen Re-Evaluation:   Oxygen Discharge (Final Oxygen Re-Evaluation):   Initial Exercise Prescription: Initial Exercise Prescription - 06/15/18 0900      Date of Initial Exercise RX and Referring Provider   Date  06/15/18    Referring Provider  [name]     Expected Discharge Date  09/22/18      Bike   Level  0.8    Minutes  10    METs  2.68      NuStep   Level  2    SPM  75    Minutes  10    METs  2.5      Track   Laps  13    Minutes  10    METs  3.3      Prescription Details   Frequency (times per week)  3x    Duration  Progress to 30 minutes of continuous aerobic without signs/symptoms of physical distress      Intensity   THRR 40-80% of Max Heartrate  59-118    Ratings of Perceived Exertion  11-13    Perceived Dyspnea  0-4      Progression   Progression  Continue progressive overload as per policy without signs/symptoms or physical distress.      Resistance Training   Training Prescription  Yes    Weight  3lbs    Reps  10-15       Perform Capillary Blood Glucose checks as needed.  Exercise Prescription Changes:   Exercise Comments:   Exercise Goals and Review: Exercise Goals    Row Name 06/15/18  0812             Exercise Goals   Increase Physical Activity  Yes       Intervention  Provide advice, education, support and counseling about physical activity/exercise needs.;Develop an individualized exercise prescription for aerobic and resistive training based on initial evaluation findings, risk  stratification, comorbidities and participant's personal goals.       Expected Outcomes  Short Term: Attend rehab on a regular basis to increase amount of physical activity.;Long Term: Add in home exercise to make exercise part of routine and to increase amount of physical activity.;Long Term: Exercising regularly at least 3-5 days a week.       Increase Strength and Stamina  Yes increase muscle tone       Intervention  Provide advice, education, support and counseling about physical activity/exercise needs.;Develop an individualized exercise prescription for aerobic and resistive training based on initial evaluation findings, risk stratification, comorbidities and participant's personal goals.       Expected Outcomes  Short Term: Increase workloads from initial exercise prescription for resistance, speed, and METs.;Short Term: Perform resistance training exercises routinely during rehab and add in resistance training at home;Long Term: Improve cardiorespiratory fitness, muscular endurance and strength as measured by increased METs and functional capacity (6MWT)       Able to understand and use rate of perceived exertion (RPE) scale  Yes       Intervention  Provide education and explanation on how to use RPE scale       Expected Outcomes  Short Term: Able to use RPE daily in rehab to express subjective intensity level;Long Term:  Able to use RPE to guide intensity level when exercising independently       Knowledge and understanding of Target Heart Rate Range (THRR)  Yes       Intervention  Provide education and explanation of THRR including how the numbers were predicted and where they are located for reference       Expected Outcomes  Short Term: Able to state/look up THRR;Long Term: Able to use THRR to govern intensity when exercising independently;Short Term: Able to use daily as guideline for intensity in rehab       Able to check pulse independently  Yes       Intervention  Provide education and  demonstration on how to check pulse in carotid and radial arteries.;Review the importance of being able to check your own pulse for safety during independent exercise       Expected Outcomes  Short Term: Able to explain why pulse checking is important during independent exercise;Long Term: Able to check pulse independently and accurately          Exercise Goals Re-Evaluation :    Discharge Exercise Prescription (Final Exercise Prescription Changes):   Nutrition:  Target Goals: Understanding of nutrition guidelines, daily intake of sodium 1500mg , cholesterol 200mg , calories 30% from fat and 7% or less from saturated fats, daily to have 5 or more servings of fruits and vegetables.  Biometrics: Pre Biometrics - 06/15/18 0928      Pre Biometrics   Height  5\' 8"  (1.727 m)    Weight  196 lb 13.9 oz (89.3 kg)    Waist Circumference  42.5 inches    Hip Circumference  41.25 inches    Waist to Hip Ratio  1.03 %    BMI (Calculated)  29.94    Triceps Skinfold  26 mm    % Body Fat  32 %  Grip Strength  35 kg    Flexibility  0 in    Single Leg Stand  2.12 seconds        Nutrition Therapy Plan and Nutrition Goals:   Nutrition Assessments:   Nutrition Goals Re-Evaluation:   Nutrition Goals Re-Evaluation:   Nutrition Goals Discharge (Final Nutrition Goals Re-Evaluation):   Psychosocial: Target Goals: Acknowledge presence or absence of significant depression and/or stress, maximize coping skills, provide positive support system. Participant is able to verbalize types and ability to use techniques and skills needed for reducing stress and depression.  Initial Review & Psychosocial Screening: Initial Psych Review & Screening - 06/15/18 0838      Initial Review   Current issues with  None Identified      Family Dynamics   Good Support System?  Yes Akili details his wife and daughter as sources of support.       Barriers   Psychosocial barriers to participate in program   There are no identifiable barriers or psychosocial needs.      Screening Interventions   Interventions  Encouraged to exercise       Quality of Life Scores: Quality of Life - 06/15/18 0836      Quality of Life   Select  Quality of Life      Quality of Life Scores   Health/Function Pre  26.4 %    Socioeconomic Pre  29.14 %    Psych/Spiritual Pre  25.71 %    Family Pre  28.5 %    GLOBAL Pre  27.13 %      Scores of 19 and below usually indicate a poorer quality of life in these areas.  A difference of  2-3 points is a clinically meaningful difference.  A difference of 2-3 points in the total score of the Quality of Life Index has been associated with significant improvement in overall quality of life, self-image, physical symptoms, and general health in studies assessing change in quality of life.  PHQ-9: Recent Review Flowsheet Data    Depression screen [facility] 2/9 06/21/2016 06/09/2015   Decreased Interest 0 0   Down, Depressed, Hopeless 0 0   PHQ - 2 Score 0 0     Interpretation of Total Score  Total Score Depression Severity:  1-4 = Minimal depression, 5-9 = Mild depression, 10-14 = Moderate depression, 15-19 = Moderately severe depression, 20-27 = Severe depression   Psychosocial Evaluation and Intervention:   Psychosocial Re-Evaluation:   Psychosocial Discharge (Final Psychosocial Re-Evaluation):   Vocational Rehabilitation: Provide vocational rehab assistance to qualifying candidates.   Vocational Rehab Evaluation & Intervention: Vocational Rehab - 06/15/18 0836      Initial Vocational Rehab Evaluation & Intervention   Assessment shows need for Vocational Rehabilitation  No       Education: Education Goals: Education classes will be provided on a weekly basis, covering required topics. Participant will state understanding/return demonstration of topics presented.  Learning Barriers/Preferences: Learning Barriers/Preferences - 06/15/18 2774      Learning  Barriers/Preferences   Learning Barriers  Sight    Learning Preferences  Skilled Demonstration       Education Topics: Count Your Pulse:  -Group instruction provided by verbal instruction, demonstration, patient participation and written materials to support subject.  Instructors address importance of being able to find your pulse and how to count your pulse when at home without a heart monitor.  Patients get hands on experience counting their pulse with staff help and individually.   Heart  Attack, Angina, and Risk Factor Modification:  -Group instruction provided by verbal instruction, video, and written materials to support subject.  Instructors address signs and symptoms of angina and heart attacks.    Also discuss risk factors for heart disease and how to make changes to improve heart health risk factors.   Functional Fitness:  -Group instruction provided by verbal instruction, demonstration, patient participation, and written materials to support subject.  Instructors address safety measures for doing things around the house.  Discuss how to get up and down off the floor, how to pick things up properly, how to safely get out of a chair without assistance, and balance training.   Meditation and Mindfulness:  -Group instruction provided by verbal instruction, patient participation, and written materials to support subject.  Instructor addresses importance of mindfulness and meditation practice to help reduce stress and improve awareness.  Instructor also leads participants through a meditation exercise.    Stretching for Flexibility and Mobility:  -Group instruction provided by verbal instruction, patient participation, and written materials to support subject.  Instructors lead participants through series of stretches that are designed to increase flexibility thus improving mobility.  These stretches are additional exercise for major muscle groups that are typically performed during  regular warm up and cool down.   Hands Only CPR:  -Group verbal, video, and participation provides a basic overview of AHA guidelines for community CPR. Role-play of emergencies allow participants the opportunity to practice calling for help and chest compression technique with discussion of AED use.   Hypertension: -Group verbal and written instruction that provides a basic overview of hypertension including the most recent diagnostic guidelines, risk factor reduction with self-care instructions and medication management.    Nutrition I class: Heart Healthy Eating:  -Group instruction provided by PowerPoint slides, verbal discussion, and written materials to support subject matter. The instructor gives an explanation and review of the Therapeutic Lifestyle Changes diet recommendations, which includes a discussion on lipid goals, dietary fat, sodium, fiber, plant stanol/sterol esters, sugar, and the components of a well-balanced, healthy diet.   Nutrition II class: Lifestyle Skills:  -Group instruction provided by PowerPoint slides, verbal discussion, and written materials to support subject matter. The instructor gives an explanation and review of label reading, grocery shopping for heart health, heart healthy recipe modifications, and ways to make healthier choices when eating out.   Diabetes Question & Answer:  -Group instruction provided by PowerPoint slides, verbal discussion, and written materials to support subject matter. The instructor gives an explanation and review of diabetes co-morbidities, pre- and post-prandial blood glucose goals, pre-exercise blood glucose goals, signs, symptoms, and treatment of hypoglycemia and hyperglycemia, and foot care basics.   Diabetes Blitz:  -Group instruction provided by PowerPoint slides, verbal discussion, and written materials to support subject matter. The instructor gives an explanation and review of the physiology behind type 1 and type 2  diabetes, diabetes medications and rational behind using different medications, pre- and post-prandial blood glucose recommendations and Hemoglobin A1c goals, diabetes diet, and exercise including blood glucose guidelines for exercising safely.    Portion Distortion:  -Group instruction provided by PowerPoint slides, verbal discussion, written materials, and food models to support subject matter. The instructor gives an explanation of serving size versus portion size, changes in portions sizes over the last 20 years, and what consists of a serving from each food group.   Stress Management:  -Group instruction provided by verbal instruction, video, and written materials to support subject matter.  Instructors  review role of stress in heart disease and how to cope with stress positively.     Exercising on Your Own:  -Group instruction provided by verbal instruction, power point, and written materials to support subject.  Instructors discuss benefits of exercise, components of exercise, frequency and intensity of exercise, and end points for exercise.  Also discuss use of nitroglycerin and activating EMS.  Review options of places to exercise outside of rehab.  Review guidelines for sex with heart disease.   Cardiac Drugs I:  -Group instruction provided by verbal instruction and written materials to support subject.  Instructor reviews cardiac drug classes: antiplatelets, anticoagulants, beta blockers, and statins.  Instructor discusses reasons, side effects, and lifestyle considerations for each drug class.   Cardiac Drugs II:  -Group instruction provided by verbal instruction and written materials to support subject.  Instructor reviews cardiac drug classes: angiotensin converting enzyme inhibitors (ACE-I), angiotensin II receptor blockers (ARBs), nitrates, and calcium channel blockers.  Instructor discusses reasons, side effects, and lifestyle considerations for each drug class.   Anatomy and  Physiology of the Circulatory System:  Group verbal and written instruction and models provide basic cardiac anatomy and physiology, with the coronary electrical and arterial systems. Review of: AMI, Angina, Valve disease, Heart Failure, Peripheral Artery Disease, Cardiac Arrhythmia, Pacemakers, and the ICD.   Other Education:  -Group or individual verbal, written, or video instructions that support the educational goals of the cardiac rehab program.   Holiday Eating Survival Tips:  -Group instruction provided by PowerPoint slides, verbal discussion, and written materials to support subject matter. The instructor gives patients tips, tricks, and techniques to help them not only survive but enjoy the holidays despite the onslaught of food that accompanies the holidays.   Knowledge Questionnaire Score: Knowledge Questionnaire Score - 06/15/18 0836      Knowledge Questionnaire Score   Pre Score  21/24       Core Components/Risk Factors/Patient Goals at Admission: Personal Goals and Risk Factors at Admission - 06/15/18 0941      Core Components/Risk Factors/Patient Goals on Admission    Weight Management  Weight Loss       Core Components/Risk Factors/Patient Goals Review:    Core Components/Risk Factors/Patient Goals at Discharge (Final Review):    ITP Comments: ITP Comments    Row Name 06/15/18 0810           ITP Comments  DrMarland Kitchen Fransico Him, Medical Director          Comments: Patient attended orientation from 0732 to [phone] to review rules and guidelines for program. Completed 6 minute walk test, Intitial ITP, and exercise prescription.  VSS. Telemetry-SR.  Asymptomatic.

## 2018-06-21 ENCOUNTER — Encounter (HOSPITAL_COMMUNITY): Payer: Self-pay

## 2018-06-21 ENCOUNTER — Encounter (HOSPITAL_COMMUNITY): Payer: Medicare HMO

## 2018-06-21 ENCOUNTER — Encounter (HOSPITAL_COMMUNITY)
Admission: RE | Admit: 2018-06-21 | Discharge: 2018-06-21 | Disposition: A | Discharge status: Home / Self Care | Payer: Medicare HMO | Source: Ambulatory Visit | Attending: Cardiology | Admitting: Cardiology

## 2018-06-21 DIAGNOSIS — E785 Hyperlipidemia, unspecified: Secondary | ICD-10-CM | POA: Diagnosis not present

## 2018-06-21 DIAGNOSIS — Z951 Presence of aortocoronary bypass graft: Secondary | ICD-10-CM

## 2018-06-21 DIAGNOSIS — I10 Essential (primary) hypertension: Secondary | ICD-10-CM | POA: Diagnosis not present

## 2018-06-21 DIAGNOSIS — Z87891 Personal history of nicotine dependence: Secondary | ICD-10-CM | POA: Diagnosis not present

## 2018-06-21 DIAGNOSIS — I2510 Atherosclerotic heart disease of native coronary artery without angina pectoris: Secondary | ICD-10-CM | POA: Diagnosis not present

## 2018-06-21 NOTE — Progress Notes (Signed)
Daily Session Note  Patient Details  Name: Vincent Stevens MRN: [id number] Date of Birth: 24-Oct-1946 Referring Provider:     CARDIAC REHAB PHASE II ORIENTATION from 06/15/2018 in [facility]  Referring Provider  [name]       Encounter Date: 06/21/2018  Check In: Session Check In - 06/21/18 0748      Check-In   Location  [facility]    Staff Present  Seward Carol, MS, ACSM CEP, Exercise Physiologist;Marilena Trevathan Karle Starch, RN, Mosie Epstein, MS,ACSM CEP, Exercise Physiologist;Joann Rion, RN, BSN    Supervising physician immediately available to respond to emergencies  [facility] immediately available    Physician(s)  Dr. Marthenia Rolling    Medication changes reported      No    Fall or balance concerns reported     No    Tobacco Cessation  No Change    Warm-up and Cool-down  Performed as group-led instruction    Resistance Training Performed  No    VAD Patient?  No    PAD/SET Patient?  No      Pain Assessment   Currently in Pain?  No/denies    Multiple Pain Sites  No       Capillary Blood Glucose: No results found for this or any previous visit (from the past 24 hour(s)).  Exercise Prescription Changes - 06/21/18 1000      Response to Exercise   Blood Pressure (Admit)  136/70    Blood Pressure (Exercise)  120/78    Blood Pressure (Exit)  118/70    Heart Rate (Admit)  73 bpm    Heart Rate (Exercise)  105 bpm    Heart Rate (Exit)  68 bpm    Rating of Perceived Exertion (Exercise)  13    Perceived Dyspnea (Exercise)  0    Symptoms  None     Comments  Pt oriented to exercise equipment     Duration  Progress to 30 minutes of  aerobic without signs/symptoms of physical distress    Intensity  THRR New      Progression   Progression  Continue to progress workloads to maintain intensity without signs/symptoms of physical distress.    Average METs  2.8      Resistance Training   Training Prescription  No      Interval Training   Interval Training  No      Bike   Level  0.8    Minutes  10    METs  2.69      NuStep   Level  2    SPM  85    Minutes  10    METs  2.1      Track   Laps  15    Minutes  10    METs  3.61       Social History   Tobacco Use  Smoking Status Former Smoker  . Last attempt to quit: 11/29/1980  . Years since quitting: 37.5  Smokeless Tobacco Never Used    Goals Met:  Exercise tolerated well  Goals Unmet:  Not Applicable  Comments: Pt started cardiac rehab today.  Pt tolerated light exercise without difficulty. VSS, telemetry-SR, asymptomatic.  Medication list reconciled. Pt denies barriers to medicaiton compliance.  PSYCHOSOCIAL ASSESSMENT:  PHQ-0. Pt exhibits positive coping skills, hopeful outlook with supportive family. No psychosocial needs identified at this time, no psychosocial interventions necessary.  Pt oriented to exercise equipment and  routine.    Understanding verbalized.    Dr. Fransico Him is Medical Director for Cardiac Rehab at [facility].

## 2018-06-23 ENCOUNTER — Encounter (HOSPITAL_COMMUNITY)
Admission: RE | Admit: 2018-06-23 | Discharge: 2018-06-23 | Disposition: A | Discharge status: Home / Self Care | Payer: Medicare HMO | Source: Ambulatory Visit | Attending: Cardiology | Admitting: Cardiology

## 2018-06-23 ENCOUNTER — Encounter (HOSPITAL_COMMUNITY): Payer: Medicare HMO

## 2018-06-23 DIAGNOSIS — Z951 Presence of aortocoronary bypass graft: Secondary | ICD-10-CM | POA: Diagnosis not present

## 2018-06-23 DIAGNOSIS — I10 Essential (primary) hypertension: Secondary | ICD-10-CM | POA: Diagnosis not present

## 2018-06-23 DIAGNOSIS — Z87891 Personal history of nicotine dependence: Secondary | ICD-10-CM | POA: Diagnosis not present

## 2018-06-23 DIAGNOSIS — I2510 Atherosclerotic heart disease of native coronary artery without angina pectoris: Secondary | ICD-10-CM | POA: Diagnosis not present

## 2018-06-23 DIAGNOSIS — E785 Hyperlipidemia, unspecified: Secondary | ICD-10-CM | POA: Diagnosis not present

## 2018-06-26 ENCOUNTER — Encounter (HOSPITAL_COMMUNITY)
Admission: RE | Admit: 2018-06-26 | Discharge: 2018-06-26 | Disposition: A | Discharge status: Home / Self Care | Payer: Medicare HMO | Source: Ambulatory Visit | Attending: Cardiology | Admitting: Cardiology

## 2018-06-26 ENCOUNTER — Encounter (HOSPITAL_COMMUNITY): Payer: Medicare HMO

## 2018-06-26 VITALS — Ht 68.0 in | Wt 197.8 lb

## 2018-06-26 DIAGNOSIS — E785 Hyperlipidemia, unspecified: Secondary | ICD-10-CM | POA: Diagnosis not present

## 2018-06-26 DIAGNOSIS — I10 Essential (primary) hypertension: Secondary | ICD-10-CM | POA: Diagnosis not present

## 2018-06-26 DIAGNOSIS — Z951 Presence of aortocoronary bypass graft: Secondary | ICD-10-CM

## 2018-06-26 DIAGNOSIS — I2510 Atherosclerotic heart disease of native coronary artery without angina pectoris: Secondary | ICD-10-CM | POA: Diagnosis not present

## 2018-06-26 DIAGNOSIS — Z87891 Personal history of nicotine dependence: Secondary | ICD-10-CM | POA: Diagnosis not present

## 2018-06-26 NOTE — Progress Notes (Signed)
Vincent Stevens 72 y.o. male Nutrition Note Spoke with pt. Nutrition Plan and Nutrition Survey goals reviewed with pt. Pt is following a Heart Healthy diet. Pt wants to lose wt. Pt has been trying to lose wt by eating smaller portions. Wt loss tips reviewed. Set goal with patient to focus on portion sizes at meals and use measuring spoons and cups to help weigh and measure foods eaten. Additionally discussed healthy plate method to help guide patient in building a plate at mealtimes. Pt is prediabetic. Last A1c 6.2. Discussed following a consistent carbohydrate heart healthy diet. Per discussion, pt does not use canned/convenience foods often. Pt rarely adds salt to food. Pt eats out infrequently. Pt expressed understanding of the information reviewed. Pt aware of nutrition education classes offered and plans on attending nutrition classes as able.    Lab Results  Component Value Date   HGBA1C 6.2 06/24/2017    Wt Readings from Last 3 Encounters:  06/15/18 196 lb 13.9 oz (89.3 kg)  04/10/18 197 lb (89.4 kg)  03/20/18 195 lb 6.4 oz (88.6 kg)    Nutrition Diagnosis ? Food-and nutrition-related knowledge deficit related to lack of exposure to information as related to diagnosis of: ? CVD ? CHF ? Obesity related to excessive energy intake as evidenced by a BMI of Body mass index is 30.07 kg/m.  Nutrition Intervention ? Pt's individual nutrition plan reviewed with pt. ? Benefits of adopting Heart Healthy diet discussed when Medficts reviewed.     Goal(s)   ? Pt to identify and limit food sources of saturated fat, trans fat, and sodium ? Pt to identify food quantities necessary to achieve weight loss of 6-24 lb at graduation from cardiac rehab.  ? Pt able to name foods that affect blood glucose.  Plan:  Pt to attend nutrition classes ? Nutrition I ? Nutrition II ? Portion Distortion  Will provide client-centered nutrition education as part of interdisciplinary care.   Monitor and  evaluate progress toward nutrition goal with team.   Laurina Bustle, MS, RD, LDN 06/26/2018 7:39 AM

## 2018-06-28 ENCOUNTER — Encounter (HOSPITAL_COMMUNITY): Payer: Medicare HMO

## 2018-06-28 ENCOUNTER — Encounter (HOSPITAL_COMMUNITY)
Admission: RE | Admit: 2018-06-28 | Discharge: 2018-06-28 | Disposition: A | Discharge status: Home / Self Care | Payer: Medicare HMO | Source: Ambulatory Visit | Attending: Cardiology | Admitting: Cardiology

## 2018-06-28 DIAGNOSIS — Z951 Presence of aortocoronary bypass graft: Secondary | ICD-10-CM

## 2018-06-28 DIAGNOSIS — E785 Hyperlipidemia, unspecified: Secondary | ICD-10-CM | POA: Diagnosis not present

## 2018-06-28 DIAGNOSIS — Z87891 Personal history of nicotine dependence: Secondary | ICD-10-CM | POA: Diagnosis not present

## 2018-06-28 DIAGNOSIS — I10 Essential (primary) hypertension: Secondary | ICD-10-CM | POA: Diagnosis not present

## 2018-06-28 DIAGNOSIS — I2510 Atherosclerotic heart disease of native coronary artery without angina pectoris: Secondary | ICD-10-CM | POA: Diagnosis not present

## 2018-06-30 ENCOUNTER — Encounter (HOSPITAL_COMMUNITY)
Admission: RE | Admit: 2018-06-30 | Discharge: 2018-06-30 | Disposition: A | Discharge status: Home / Self Care | Payer: Medicare HMO | Source: Ambulatory Visit | Attending: Cardiology | Admitting: Cardiology

## 2018-06-30 ENCOUNTER — Encounter (HOSPITAL_COMMUNITY): Payer: Medicare HMO

## 2018-06-30 DIAGNOSIS — E785 Hyperlipidemia, unspecified: Secondary | ICD-10-CM | POA: Insufficient documentation

## 2018-06-30 DIAGNOSIS — I2510 Atherosclerotic heart disease of native coronary artery without angina pectoris: Secondary | ICD-10-CM | POA: Diagnosis not present

## 2018-06-30 DIAGNOSIS — Z951 Presence of aortocoronary bypass graft: Secondary | ICD-10-CM | POA: Diagnosis not present

## 2018-06-30 DIAGNOSIS — I10 Essential (primary) hypertension: Secondary | ICD-10-CM | POA: Insufficient documentation

## 2018-06-30 DIAGNOSIS — Z87891 Personal history of nicotine dependence: Secondary | ICD-10-CM | POA: Diagnosis not present

## 2018-07-02 ENCOUNTER — Other Ambulatory Visit: Payer: Self-pay | Admitting: Internal Medicine

## 2018-07-03 ENCOUNTER — Encounter (HOSPITAL_COMMUNITY): Payer: Medicare HMO

## 2018-07-03 ENCOUNTER — Encounter (HOSPITAL_COMMUNITY)
Admission: RE | Admit: 2018-07-03 | Discharge: 2018-07-03 | Disposition: A | Discharge status: Home / Self Care | Payer: Medicare HMO | Source: Ambulatory Visit | Attending: Cardiology | Admitting: Cardiology

## 2018-07-03 DIAGNOSIS — Z951 Presence of aortocoronary bypass graft: Secondary | ICD-10-CM | POA: Diagnosis not present

## 2018-07-03 DIAGNOSIS — I10 Essential (primary) hypertension: Secondary | ICD-10-CM | POA: Diagnosis not present

## 2018-07-03 DIAGNOSIS — I2510 Atherosclerotic heart disease of native coronary artery without angina pectoris: Secondary | ICD-10-CM | POA: Diagnosis not present

## 2018-07-03 DIAGNOSIS — Z87891 Personal history of nicotine dependence: Secondary | ICD-10-CM | POA: Diagnosis not present

## 2018-07-03 DIAGNOSIS — E785 Hyperlipidemia, unspecified: Secondary | ICD-10-CM | POA: Diagnosis not present

## 2018-07-05 ENCOUNTER — Encounter (HOSPITAL_COMMUNITY)
Admission: RE | Admit: 2018-07-05 | Discharge: 2018-07-05 | Disposition: A | Discharge status: Home / Self Care | Payer: Medicare HMO | Source: Ambulatory Visit | Attending: Cardiology | Admitting: Cardiology

## 2018-07-05 ENCOUNTER — Encounter (HOSPITAL_COMMUNITY): Payer: Medicare HMO

## 2018-07-05 ENCOUNTER — Encounter (HOSPITAL_COMMUNITY): Payer: Self-pay

## 2018-07-05 DIAGNOSIS — Z951 Presence of aortocoronary bypass graft: Secondary | ICD-10-CM | POA: Diagnosis not present

## 2018-07-05 DIAGNOSIS — Z87891 Personal history of nicotine dependence: Secondary | ICD-10-CM | POA: Diagnosis not present

## 2018-07-05 DIAGNOSIS — I2510 Atherosclerotic heart disease of native coronary artery without angina pectoris: Secondary | ICD-10-CM | POA: Diagnosis not present

## 2018-07-05 DIAGNOSIS — I10 Essential (primary) hypertension: Secondary | ICD-10-CM | POA: Diagnosis not present

## 2018-07-05 DIAGNOSIS — E785 Hyperlipidemia, unspecified: Secondary | ICD-10-CM | POA: Diagnosis not present

## 2018-07-06 NOTE — Progress Notes (Signed)
Cardiac Individual Treatment Plan  Patient Details  Name: Vincent Stevens MRN: [id number] Date of Birth: [birth year] Referring Provider:     [facility] from 06/15/2018 in [place]  Referring Provider  [name]       Initial Encounter Date:    CARDIAC REHAB PHASE II ORIENTATION from 06/15/2018 in [place]  Date  06/15/18      Visit Diagnosis: 03/11/18 S/P CABG x 3  Patient's Home Medications on Admission:  Current Outpatient Medications:  .  aspirin EC 325 MG EC tablet, Take 1 tablet (325 mg total) by mouth daily., Disp: 30 tablet, Rfl: 0 .  Multiple Vitamin (MULTIVITAMIN) tablet, Take 1 tablet by mouth daily., Disp: 90 tablet, Rfl: 3 .  rosuvastatin (CRESTOR) 20 MG tablet, Take 1 tablet (20 mg total) by mouth daily., Disp: 90 tablet, Rfl: 3 .  tamsulosin (FLOMAX) 0.4 MG CAPS capsule, TAKE 1 CAPSULE (0.4 MG TOTAL) BY MOUTH DAILY., Disp: 90 capsule, Rfl: 1 .  triamcinolone cream (KENALOG) 0.1 %, Apply 1 application topically 2 (two) times daily. (Patient not taking: Reported on 06/07/2018), Disp: 45 g, Rfl: 2  Past Medical History: Past Medical History:  Diagnosis Date  . Allergy   . Benign prostatic hypertrophy   . BENIGN PROSTATIC HYPERTROPHY 09/19/2009   Qualifier: Diagnosis of  By: Burnice Logan  MD, Doretha Sou   . Chest pain 02/23/2018  . Dyslipidemia 08/14/2010   Qualifier: Diagnosis of  By: Burnice Logan  MD, Doretha Sou   . Essential hypertension 01/17/2009   Qualifier: Diagnosis of  By: Burnice Logan  MD, Doretha Sou   . Gout   . GOUT, ACUTE 11/07/2009   Qualifier: Diagnosis of  By: Burnice Logan  MD, Doretha Sou   . History of colonic polyps 09/27/2008   Qualifier: Diagnosis of  By: Burnice Logan  MD, Doretha Sou  Initial colonoscopy 2003.  Hyperplastic polyps only Followup colonoscopy 2008.  Normal  10 year interval recommended   . Hyperlipidemia   . Hypertension   . Impaired glucose tolerance  02/20/2013  . Left main coronary artery disease   . SOB (shortness of breath) 02/23/2018    Tobacco Use: Social History   Tobacco Use  Smoking Status Former Smoker  . Last attempt to quit: 11/29/1980  . Years since quitting: 37.6  Smokeless Tobacco Never Used    Labs: Recent Review Flowsheet Data    Labs for ITP Cardiac and Pulmonary Rehab Latest Ref Rng & Units 03/02/2018 03/02/2018 03/02/2018 03/20/2018 04/19/2018   Cholestrol 100 - 199 mg/dL - - - 144 134   LDLCALC 0 - 99 mg/dL - - - 78 70   HDL >39 mg/dL - - - 40 43   Trlycerides 0 - 149 mg/dL - - - 129 105   Hemoglobin A1c 4.6 - 6.5 % - - - - -   PHART 7.350 - 7.450 7.336(L) 7.336(L) - - -   PCO2ART 32.0 - 48.0 mmHg 39.4 40.5 - - -   HCO3 20.0 - 28.0 mmol/L 21.0 21.6 - - -   TCO2 22 - 32 mmol/L 22 23 23  - -   ACIDBASEDEF 0.0 - 2.0 mmol/L 4.0(H) 4.0(H) - - -   O2SAT % 99.0 98.0 - - -      Capillary Blood Glucose: Lab Results  Component Value Date   GLUCAP 99 03/05/2018   GLUCAP 113 (H) 03/05/2018   GLUCAP 104 (H) 03/04/2018   GLUCAP 106 (  H) 03/04/2018   GLUCAP 110 (H) 03/04/2018     Exercise Target Goals:    Exercise Program Goal: Individual exercise prescription set using results from initial 6 min walk test and THRR while considering  patient's activity barriers and safety.   Exercise Prescription Goal: Initial exercise prescription builds to 30-45 minutes a day of aerobic activity, 2-3 days per week.  Home exercise guidelines will be given to patient during program as part of exercise prescription that the participant will acknowledge.  Activity Barriers & Risk Stratification: Activity Barriers & Cardiac Risk Stratification - 06/15/18 0938      Activity Barriers & Cardiac Risk Stratification   Comments  B knee discomfort, L shoulder pain/limitations       6 Minute Walk: 6 Minute Walk    Row Name 06/15/18 0927         6 Minute Walk   Phase  Initial     Distance  1725 feet     Walk Time  6 minutes     #  of Rest Breaks  0     MPH  3.26     METS  3.36     RPE  12     Perceived Dyspnea   0     VO2 Peak  11.77     Symptoms  Yes (comment)     Comments  Left Knee Pain +6     Resting HR  71 bpm     Resting BP  102/70     Resting Oxygen Saturation   97 %     Exercise Oxygen Saturation  during 6 min walk  96 %     Max Ex. HR  109 bpm     Max Ex. BP  122/80     2 Minute Post BP  122/70        Oxygen Initial Assessment:   Oxygen Re-Evaluation:   Oxygen Discharge (Final Oxygen Re-Evaluation):   Initial Exercise Prescription: Initial Exercise Prescription - 06/15/18 0900      Date of Initial Exercise RX and Referring Provider   Date  06/15/18    Referring Provider  [name]     Expected Discharge Date  09/22/18      Bike   Level  0.8    Minutes  10    METs  2.68      NuStep   Level  2    SPM  75    Minutes  10    METs  2.5      Track   Laps  13    Minutes  10    METs  3.3      Prescription Details   Frequency (times per week)  3x    Duration  Progress to 30 minutes of continuous aerobic without signs/symptoms of physical distress      Intensity   THRR 40-80% of Max Heartrate  59-118    Ratings of Perceived Exertion  11-13    Perceived Dyspnea  0-4      Progression   Progression  Continue progressive overload as per policy without signs/symptoms or physical distress.      Resistance Training   Training Prescription  Yes    Weight  3lbs    Reps  10-15       Perform Capillary Blood Glucose checks as needed.  Exercise Prescription Changes: Exercise Prescription Changes    Row Name 06/21/18 1000 07/03/18 1455  Response to Exercise   Blood Pressure (Admit)  136/70  126/80      Blood Pressure (Exercise)  120/78  126/70      Blood Pressure (Exit)  118/70  120/72      Heart Rate (Admit)  73 bpm  82 bpm      Heart Rate (Exercise)  105 bpm  106 bpm      Heart Rate (Exit)  68 bpm  82 bpm      Rating of Perceived Exertion (Exercise)  13   13      Perceived Dyspnea (Exercise)  0  0      Symptoms  None   None       Comments  Pt oriented to exercise equipment   -      Duration  Progress to 30 minutes of  aerobic without signs/symptoms of physical distress  Continue with 30 min of aerobic exercise without signs/symptoms of physical distress.      Intensity  THRR New  THRR unchanged        Progression   Progression  Continue to progress workloads to maintain intensity without signs/symptoms of physical distress.  Continue to progress workloads to maintain intensity without signs/symptoms of physical distress.      Average METs  2.8  3.1        Resistance Training   Training Prescription  No  Yes      Weight  -  3lbs      Reps  -  10-15      Time  -  10 Minutes        Interval Training   Interval Training  No  No        Bike   Level  0.8  0.8      Minutes  10  10      METs  2.69  2.69        NuStep   Level  2  3      SPM  85  95      Minutes  10  10      METs  2.1  2.9        Track   Laps  15  16      Minutes  10  10      METs  3.61  3.78         Exercise Comments: Exercise Comments    Row Name 06/21/18 0959 07/05/18 1456         Exercise Comments  Pt's first day of exercise. Pt oriented to exercise equipment. Pt responded well to exercise prescription. Will continue to monitor and progress pt as tolerated.   Pt is continuing to respond well to exercise prescription. Will follow up with pt regarding home exercise plans. Pt states he is enjoying rehab and will stay in program longer than the 1 month he intended to. Will continue to monitor pt.          Exercise Goals and Review: Exercise Goals    Row Name 06/15/18 2542             Exercise Goals   Increase Physical Activity  Yes       Intervention  Provide advice, education, support and counseling about physical activity/exercise needs.;Develop an individualized exercise prescription for aerobic and resistive training based on initial evaluation  findings, risk stratification, comorbidities and participant's personal goals.       Expected Outcomes  Short Term: Attend  rehab on a regular basis to increase amount of physical activity.;Long Term: Add in home exercise to make exercise part of routine and to increase amount of physical activity.;Long Term: Exercising regularly at least 3-5 days a week.       Increase Strength and Stamina  Yes increase muscle tone       Intervention  Provide advice, education, support and counseling about physical activity/exercise needs.;Develop an individualized exercise prescription for aerobic and resistive training based on initial evaluation findings, risk stratification, comorbidities and participant's personal goals.       Expected Outcomes  Short Term: Increase workloads from initial exercise prescription for resistance, speed, and METs.;Short Term: Perform resistance training exercises routinely during rehab and add in resistance training at home;Long Term: Improve cardiorespiratory fitness, muscular endurance and strength as measured by increased METs and functional capacity (6MWT)       Able to understand and use rate of perceived exertion (RPE) scale  Yes       Intervention  Provide education and explanation on how to use RPE scale       Expected Outcomes  Short Term: Able to use RPE daily in rehab to express subjective intensity level;Long Term:  Able to use RPE to guide intensity level when exercising independently       Knowledge and understanding of Target Heart Rate Range (THRR)  Yes       Intervention  Provide education and explanation of THRR including how the numbers were predicted and where they are located for reference       Expected Outcomes  Short Term: Able to state/look up THRR;Long Term: Able to use THRR to govern intensity when exercising independently;Short Term: Able to use daily as guideline for intensity in rehab       Able to check pulse independently  Yes       Intervention  Provide  education and demonstration on how to check pulse in carotid and radial arteries.;Review the importance of being able to check your own pulse for safety during independent exercise       Expected Outcomes  Short Term: Able to explain why pulse checking is important during independent exercise;Long Term: Able to check pulse independently and accurately          Exercise Goals Re-Evaluation :    Discharge Exercise Prescription (Final Exercise Prescription Changes): Exercise Prescription Changes - 07/03/18 1455      Response to Exercise   Blood Pressure (Admit)  126/80    Blood Pressure (Exercise)  126/70    Blood Pressure (Exit)  120/72    Heart Rate (Admit)  82 bpm    Heart Rate (Exercise)  106 bpm    Heart Rate (Exit)  82 bpm    Rating of Perceived Exertion (Exercise)  13    Perceived Dyspnea (Exercise)  0    Symptoms  None     Duration  Continue with 30 min of aerobic exercise without signs/symptoms of physical distress.    Intensity  THRR unchanged      Progression   Progression  Continue to progress workloads to maintain intensity without signs/symptoms of physical distress.    Average METs  3.1      Resistance Training   Training Prescription  Yes    Weight  3lbs    Reps  10-15    Time  10 Minutes      Interval Training   Interval Training  No      Bike  Level  0.8    Minutes  10    METs  2.69      NuStep   Level  3    SPM  95    Minutes  10    METs  2.9      Track   Laps  16    Minutes  10    METs  3.78       Nutrition:  Target Goals: Understanding of nutrition guidelines, daily intake of sodium 1500mg , cholesterol 200mg , calories 30% from fat and 7% or less from saturated fats, daily to have 5 or more servings of fruits and vegetables.  Biometrics: Pre Biometrics - 06/15/18 0928      Pre Biometrics   Height  5\' 8"  (1.727 m)    Weight  89.3 kg    Waist Circumference  42.5 inches    Hip Circumference  41.25 inches    Waist to Hip Ratio  1.03 %     BMI (Calculated)  29.94    Triceps Skinfold  26 mm    % Body Fat  32 %    Grip Strength  35 kg    Flexibility  0 in    Single Leg Stand  2.12 seconds        Nutrition Therapy Plan and Nutrition Goals: Nutrition Therapy & Goals - 06/15/18 1124      Nutrition Therapy   Diet  consistent carbohydrate heart healthy      Personal Nutrition Goals   Nutrition Goal  Pt to identify food quantities necessary to achieve weight loss of 6-24 lbs. at graduation from cardiac rehab. Goal wt of 10 lb desired.     Personal Goal #2  Pt to identify and limit food sources of saturated fat, trans fat, and sodium      Intervention Plan   Intervention  Prescribe, educate and counsel regarding individualized specific dietary modifications aiming towards targeted core components such as weight, hypertension, lipid management, diabetes, heart failure and other comorbidities.    Expected Outcomes  Short Term Goal: Understand basic principles of dietary content, such as calories, fat, sodium, cholesterol and nutrients.       Nutrition Assessments: Nutrition Assessments - 06/15/18 1115      MEDFICTS Scores   Pre Score  18       Nutrition Goals Re-Evaluation: Nutrition Goals Re-Evaluation    [place] Name 06/15/18 1114 06/15/18 1124           Goals   Current Weight  196 lb 13.9 oz (89.3 kg)  196 lb 13.9 oz (89.3 kg)         Nutrition Goals Re-Evaluation: Nutrition Goals Re-Evaluation    [place] Name 06/15/18 1114 06/15/18 1124           Goals   Current Weight  196 lb 13.9 oz (89.3 kg)  196 lb 13.9 oz (89.3 kg)         Nutrition Goals Discharge (Final Nutrition Goals Re-Evaluation): Nutrition Goals Re-Evaluation - 06/15/18 1124      Goals   Current Weight  196 lb 13.9 oz (89.3 kg)       Psychosocial: Target Goals: Acknowledge presence or absence of significant depression and/or stress, maximize coping skills, provide positive support system. Participant is able to verbalize types and  ability to use techniques and skills needed for reducing stress and depression.  Initial Review & Psychosocial Screening: Initial Psych Review & Screening - 06/15/18 9169      Initial Review  Current issues with  None Identified      Family Dynamics   Good Support System?  Yes      Barriers   Psychosocial barriers to participate in program  There are no identifiable barriers or psychosocial needs.      Screening Interventions   Interventions  Encouraged to exercise       Quality of Life Scores: Quality of Life - 06/15/18 0836      Quality of Life   Select  Quality of Life      Quality of Life Scores   Health/Function Pre  26.4 %    Socioeconomic Pre  29.14 %    Psych/Spiritual Pre  25.71 %    Family Pre  28.5 %    GLOBAL Pre  27.13 %      Scores of 19 and below usually indicate a poorer quality of life in these areas.  A difference of  2-3 points is a clinically meaningful difference.  A difference of 2-3 points in the total score of the Quality of Life Index has been associated with significant improvement in overall quality of life, self-image, physical symptoms, and general health in studies assessing change in quality of life.  PHQ-9: Recent Review Flowsheet Data    Depression screen [facility] 2/9 06/21/2018 06/21/2016 06/09/2015   Decreased Interest 0 0 0   Down, Depressed, Hopeless 0 0 0   PHQ - 2 Score 0 0 0     Interpretation of Total Score  Total Score Depression Severity:  1-4 = Minimal depression, 5-9 = Mild depression, 10-14 = Moderate depression, 15-19 = Moderately severe depression, 20-27 = Severe depression   Psychosocial Evaluation and Intervention: Psychosocial Evaluation - 06/21/18 0747      Psychosocial Evaluation & Interventions   Interventions  Encouraged to exercise with the program and follow exercise prescription    Comments  No psychosocial needs identified.  No interventions necessary.     Expected Outcomes  Pt will exhibit a positive outlook  with good coping skills.    Continue Psychosocial Services   No Follow up required       Psychosocial Re-Evaluation: Psychosocial Re-Evaluation    [place] Name 07/05/18 1116             Psychosocial Re-Evaluation   Current issues with  None Identified       Comments  No psychosocial needs identified.        Expected Outcomes  Vincent Stevens will maintain a positive outlook with good coping skills.        Interventions  Encouraged to attend Cardiac Rehabilitation for the exercise       Continue Psychosocial Services   No Follow up required          Psychosocial Discharge (Final Psychosocial Re-Evaluation): Psychosocial Re-Evaluation - 07/05/18 1116      Psychosocial Re-Evaluation   Current issues with  None Identified    Comments  No psychosocial needs identified.     Expected Outcomes  Vincent Stevens will maintain a positive outlook with good coping skills.     Interventions  Encouraged to attend Cardiac Rehabilitation for the exercise    Continue Psychosocial Services   No Follow up required       Vocational Rehabilitation: Provide vocational rehab assistance to qualifying candidates.   Vocational Rehab Evaluation & Intervention: Vocational Rehab - 06/15/18 0836      Initial Vocational Rehab Evaluation & Intervention   Assessment shows need for Vocational Rehabilitation  No  Education: Education Goals: Education classes will be provided on a weekly basis, covering required topics. Participant will state understanding/return demonstration of topics presented.  Learning Barriers/Preferences: Learning Barriers/Preferences - 06/15/18 0102      Learning Barriers/Preferences   Learning Barriers  Sight    Learning Preferences  Skilled Demonstration       Education Topics: Count Your Pulse:  -Group instruction provided by verbal instruction, demonstration, patient participation and written materials to support subject.  Instructors address importance of being able to find your pulse  and how to count your pulse when at home without a heart monitor.  Patients get hands on experience counting their pulse with staff help and individually.   Heart Attack, Angina, and Risk Factor Modification:  -Group instruction provided by verbal instruction, video, and written materials to support subject.  Instructors address signs and symptoms of angina and heart attacks.    Also discuss risk factors for heart disease and how to make changes to improve heart health risk factors.   Functional Fitness:  -Group instruction provided by verbal instruction, demonstration, patient participation, and written materials to support subject.  Instructors address safety measures for doing things around the house.  Discuss how to get up and down off the floor, how to pick things up properly, how to safely get out of a chair without assistance, and balance training.   Meditation and Mindfulness:  -Group instruction provided by verbal instruction, patient participation, and written materials to support subject.  Instructor addresses importance of mindfulness and meditation practice to help reduce stress and improve awareness.  Instructor also leads participants through a meditation exercise.    Stretching for Flexibility and Mobility:  -Group instruction provided by verbal instruction, patient participation, and written materials to support subject.  Instructors lead participants through series of stretches that are designed to increase flexibility thus improving mobility.  These stretches are additional exercise for major muscle groups that are typically performed during regular warm up and cool down.   Hands Only CPR:  -Group verbal, video, and participation provides a basic overview of AHA guidelines for community CPR. Role-play of emergencies allow participants the opportunity to practice calling for help and chest compression technique with discussion of AED use.   Hypertension: -Group verbal and  written instruction that provides a basic overview of hypertension including the most recent diagnostic guidelines, risk factor reduction with self-care instructions and medication management.    Nutrition I class: Heart Healthy Eating:  -Group instruction provided by PowerPoint slides, verbal discussion, and written materials to support subject matter. The instructor gives an explanation and review of the Therapeutic Lifestyle Changes diet recommendations, which includes a discussion on lipid goals, dietary fat, sodium, fiber, plant stanol/sterol esters, sugar, and the components of a well-balanced, healthy diet.   Nutrition II class: Lifestyle Skills:  -Group instruction provided by PowerPoint slides, verbal discussion, and written materials to support subject matter. The instructor gives an explanation and review of label reading, grocery shopping for heart health, heart healthy recipe modifications, and ways to make healthier choices when eating out.   Diabetes Question & Answer:  -Group instruction provided by PowerPoint slides, verbal discussion, and written materials to support subject matter. The instructor gives an explanation and review of diabetes co-morbidities, pre- and post-prandial blood glucose goals, pre-exercise blood glucose goals, signs, symptoms, and treatment of hypoglycemia and hyperglycemia, and foot care basics.   Diabetes Blitz:  -Group instruction provided by PowerPoint slides, verbal discussion, and written materials to support subject matter.  The instructor gives an explanation and review of the physiology behind type 1 and type 2 diabetes, diabetes medications and rational behind using different medications, pre- and post-prandial blood glucose recommendations and Hemoglobin A1c goals, diabetes diet, and exercise including blood glucose guidelines for exercising safely.    Portion Distortion:  -Group instruction provided by PowerPoint slides, verbal discussion,  written materials, and food models to support subject matter. The instructor gives an explanation of serving size versus portion size, changes in portions sizes over the last 20 years, and what consists of a serving from each food group.   Stress Management:  -Group instruction provided by verbal instruction, video, and written materials to support subject matter.  Instructors review role of stress in heart disease and how to cope with stress positively.     Exercising on Your Own:  -Group instruction provided by verbal instruction, power point, and written materials to support subject.  Instructors discuss benefits of exercise, components of exercise, frequency and intensity of exercise, and end points for exercise.  Also discuss use of nitroglycerin and activating EMS.  Review options of places to exercise outside of rehab.  Review guidelines for sex with heart disease.   Cardiac Drugs I:  -Group instruction provided by verbal instruction and written materials to support subject.  Instructor reviews cardiac drug classes: antiplatelets, anticoagulants, beta blockers, and statins.  Instructor discusses reasons, side effects, and lifestyle considerations for each drug class.   Cardiac Drugs II:  -Group instruction provided by verbal instruction and written materials to support subject.  Instructor reviews cardiac drug classes: angiotensin converting enzyme inhibitors (ACE-I), angiotensin II receptor blockers (ARBs), nitrates, and calcium channel blockers.  Instructor discusses reasons, side effects, and lifestyle considerations for each drug class.   Anatomy and Physiology of the Circulatory System:  Group verbal and written instruction and models provide basic cardiac anatomy and physiology, with the coronary electrical and arterial systems. Review of: AMI, Angina, Valve disease, Heart Failure, Peripheral Artery Disease, Cardiac Arrhythmia, Pacemakers, and the ICD.   Other Education:  -Group  or individual verbal, written, or video instructions that support the educational goals of the cardiac rehab program.   Holiday Eating Survival Tips:  -Group instruction provided by PowerPoint slides, verbal discussion, and written materials to support subject matter. The instructor gives patients tips, tricks, and techniques to help them not only survive but enjoy the holidays despite the onslaught of food that accompanies the holidays.   Knowledge Questionnaire Score: Knowledge Questionnaire Score - 06/15/18 0836      Knowledge Questionnaire Score   Pre Score  21/24       Core Components/Risk Factors/Patient Goals at Admission: Personal Goals and Risk Factors at Admission - 06/15/18 0941      Core Components/Risk Factors/Patient Goals on Admission    Weight Management  Weight Loss       Core Components/Risk Factors/Patient Goals Review:  Goals and Risk Factor Review    Row Name 06/21/18 0743 07/05/18 1115           Core Components/Risk Factors/Patient Goals Review   Personal Goals Review  Weight Management/Obesity;Lipids  Weight Management/Obesity;Lipids      Review  Vincent Stevens with few CAD RFs willing to participate in CR exercise. Vincent Stevens would like to rebuild muscles.  Vincent Stevens with few CAD RFs willing to participate in CR exercise. Vincent Stevens is tolerating exercise well.  He originally had mentioned only participating in program for a month but since starting Vincent Stevens has changed his mind and would  like to continue.       Expected Outcomes  Pt will continue to participate in CR exercise, nutriton, and lifestyle opportunities.   Pt will continue to participate in CR exercise, nutriton, and lifestyle opportunities.          Core Components/Risk Factors/Patient Goals at Discharge (Final Review):  Goals and Risk Factor Review - 07/05/18 1115      Core Components/Risk Factors/Patient Goals Review   Personal Goals Review  Weight Management/Obesity;Lipids    Review  Vincent Stevens with few CAD RFs willing to  participate in CR exercise. Vincent Stevens is tolerating exercise well.  He originally had mentioned only participating in program for a month but since starting Vincent Stevens has changed his mind and would like to continue.     Expected Outcomes  Pt will continue to participate in CR exercise, nutriton, and lifestyle opportunities.        ITP Comments: ITP Comments    Row Name 06/15/18 0810 06/21/18 0743 07/05/18 1114       ITP Comments  Dr.. Fransico Him, Medical Director  Vincent Stevens started exercise today and he tolerated exercise well.   30 Day ITP Review.  Vincent Stevens is tolerating exercise well with his recent start.          Comments: See ITP Comments.

## 2018-07-07 ENCOUNTER — Encounter (HOSPITAL_COMMUNITY)
Admission: RE | Admit: 2018-07-07 | Discharge: 2018-07-07 | Disposition: A | Discharge status: Home / Self Care | Payer: Medicare HMO | Source: Ambulatory Visit | Attending: Cardiology | Admitting: Cardiology

## 2018-07-07 ENCOUNTER — Encounter (HOSPITAL_COMMUNITY): Payer: Medicare HMO

## 2018-07-07 DIAGNOSIS — I2510 Atherosclerotic heart disease of native coronary artery without angina pectoris: Secondary | ICD-10-CM | POA: Diagnosis not present

## 2018-07-07 DIAGNOSIS — Z951 Presence of aortocoronary bypass graft: Secondary | ICD-10-CM | POA: Diagnosis not present

## 2018-07-07 DIAGNOSIS — E785 Hyperlipidemia, unspecified: Secondary | ICD-10-CM | POA: Diagnosis not present

## 2018-07-07 DIAGNOSIS — Z87891 Personal history of nicotine dependence: Secondary | ICD-10-CM | POA: Diagnosis not present

## 2018-07-07 DIAGNOSIS — I10 Essential (primary) hypertension: Secondary | ICD-10-CM | POA: Diagnosis not present

## 2018-07-10 ENCOUNTER — Encounter (HOSPITAL_COMMUNITY)
Admission: RE | Admit: 2018-07-10 | Discharge: 2018-07-10 | Disposition: A | Discharge status: Home / Self Care | Payer: Medicare HMO | Source: Ambulatory Visit | Attending: Cardiology | Admitting: Cardiology

## 2018-07-10 ENCOUNTER — Encounter (HOSPITAL_COMMUNITY): Payer: Medicare HMO

## 2018-07-10 DIAGNOSIS — Z951 Presence of aortocoronary bypass graft: Secondary | ICD-10-CM | POA: Diagnosis not present

## 2018-07-10 DIAGNOSIS — I10 Essential (primary) hypertension: Secondary | ICD-10-CM | POA: Diagnosis not present

## 2018-07-10 DIAGNOSIS — E785 Hyperlipidemia, unspecified: Secondary | ICD-10-CM | POA: Diagnosis not present

## 2018-07-10 DIAGNOSIS — I2510 Atherosclerotic heart disease of native coronary artery without angina pectoris: Secondary | ICD-10-CM | POA: Diagnosis not present

## 2018-07-10 DIAGNOSIS — Z87891 Personal history of nicotine dependence: Secondary | ICD-10-CM | POA: Diagnosis not present

## 2018-07-12 ENCOUNTER — Encounter (HOSPITAL_COMMUNITY)
Admission: RE | Admit: 2018-07-12 | Discharge: 2018-07-12 | Disposition: A | Discharge status: Home / Self Care | Payer: Medicare HMO | Source: Ambulatory Visit | Attending: Cardiology | Admitting: Cardiology

## 2018-07-12 ENCOUNTER — Encounter (HOSPITAL_COMMUNITY): Payer: Medicare HMO

## 2018-07-12 DIAGNOSIS — I10 Essential (primary) hypertension: Secondary | ICD-10-CM | POA: Diagnosis not present

## 2018-07-12 DIAGNOSIS — Z87891 Personal history of nicotine dependence: Secondary | ICD-10-CM | POA: Diagnosis not present

## 2018-07-12 DIAGNOSIS — Z951 Presence of aortocoronary bypass graft: Secondary | ICD-10-CM

## 2018-07-12 DIAGNOSIS — I2510 Atherosclerotic heart disease of native coronary artery without angina pectoris: Secondary | ICD-10-CM | POA: Diagnosis not present

## 2018-07-12 DIAGNOSIS — E785 Hyperlipidemia, unspecified: Secondary | ICD-10-CM | POA: Diagnosis not present

## 2018-07-14 ENCOUNTER — Encounter (HOSPITAL_COMMUNITY): Payer: Medicare HMO

## 2018-07-14 ENCOUNTER — Encounter (HOSPITAL_COMMUNITY)
Admission: RE | Admit: 2018-07-14 | Discharge: 2018-07-14 | Disposition: A | Discharge status: Home / Self Care | Payer: Medicare HMO | Source: Ambulatory Visit | Attending: Cardiology | Admitting: Cardiology

## 2018-07-14 DIAGNOSIS — Z87891 Personal history of nicotine dependence: Secondary | ICD-10-CM | POA: Diagnosis not present

## 2018-07-14 DIAGNOSIS — Z951 Presence of aortocoronary bypass graft: Secondary | ICD-10-CM | POA: Diagnosis not present

## 2018-07-14 DIAGNOSIS — I2510 Atherosclerotic heart disease of native coronary artery without angina pectoris: Secondary | ICD-10-CM | POA: Diagnosis not present

## 2018-07-14 DIAGNOSIS — I10 Essential (primary) hypertension: Secondary | ICD-10-CM | POA: Diagnosis not present

## 2018-07-14 DIAGNOSIS — E785 Hyperlipidemia, unspecified: Secondary | ICD-10-CM | POA: Diagnosis not present

## 2018-07-17 ENCOUNTER — Encounter (HOSPITAL_COMMUNITY): Payer: Medicare HMO

## 2018-07-17 ENCOUNTER — Encounter (HOSPITAL_COMMUNITY)
Admission: RE | Admit: 2018-07-17 | Discharge: 2018-07-17 | Disposition: A | Discharge status: Home / Self Care | Payer: Medicare HMO | Source: Ambulatory Visit | Attending: Cardiology | Admitting: Cardiology

## 2018-07-17 DIAGNOSIS — E785 Hyperlipidemia, unspecified: Secondary | ICD-10-CM | POA: Diagnosis not present

## 2018-07-17 DIAGNOSIS — Z951 Presence of aortocoronary bypass graft: Secondary | ICD-10-CM | POA: Diagnosis not present

## 2018-07-17 DIAGNOSIS — I10 Essential (primary) hypertension: Secondary | ICD-10-CM | POA: Diagnosis not present

## 2018-07-17 DIAGNOSIS — Z87891 Personal history of nicotine dependence: Secondary | ICD-10-CM | POA: Diagnosis not present

## 2018-07-17 DIAGNOSIS — I2510 Atherosclerotic heart disease of native coronary artery without angina pectoris: Secondary | ICD-10-CM | POA: Diagnosis not present

## 2018-07-19 ENCOUNTER — Encounter (HOSPITAL_COMMUNITY)
Admission: RE | Admit: 2018-07-19 | Discharge: 2018-07-19 | Disposition: A | Discharge status: Home / Self Care | Payer: Medicare HMO | Source: Ambulatory Visit | Attending: Cardiology | Admitting: Cardiology

## 2018-07-19 ENCOUNTER — Encounter (HOSPITAL_COMMUNITY): Payer: Medicare HMO

## 2018-07-19 DIAGNOSIS — E785 Hyperlipidemia, unspecified: Secondary | ICD-10-CM | POA: Diagnosis not present

## 2018-07-19 DIAGNOSIS — Z951 Presence of aortocoronary bypass graft: Secondary | ICD-10-CM

## 2018-07-19 DIAGNOSIS — Z87891 Personal history of nicotine dependence: Secondary | ICD-10-CM | POA: Diagnosis not present

## 2018-07-19 DIAGNOSIS — I2510 Atherosclerotic heart disease of native coronary artery without angina pectoris: Secondary | ICD-10-CM | POA: Diagnosis not present

## 2018-07-19 DIAGNOSIS — I10 Essential (primary) hypertension: Secondary | ICD-10-CM | POA: Diagnosis not present

## 2018-07-19 NOTE — Progress Notes (Signed)
I have reviewed a Home Exercise Prescription with Vincent Stevens . [name] is currently exercising at home.  The patient was advised to continue to walk 2-3 days a week for 20-30 minutes.  [name] and I discussed how to progress their exercise prescription. The patient stated that they understand the exercise prescription. We reviewed exercise guidelines, target heart rate during exercise, RPE Scale, weather conditions, NTG use, endpoints for exercise, warmup and cool down. Patient is encouraged to come to me with any questions. I will continue to follow up with the patient to assist them with progression and safety.    Carma Lair MS, ACSM CEP 10:35 AM 07/19/2018

## 2018-07-21 ENCOUNTER — Encounter (HOSPITAL_COMMUNITY): Payer: Medicare HMO

## 2018-07-21 ENCOUNTER — Encounter (HOSPITAL_COMMUNITY)
Admission: RE | Admit: 2018-07-21 | Discharge: 2018-07-21 | Disposition: A | Discharge status: Home / Self Care | Payer: Medicare HMO | Source: Ambulatory Visit | Attending: Cardiology | Admitting: Cardiology

## 2018-07-21 DIAGNOSIS — E785 Hyperlipidemia, unspecified: Secondary | ICD-10-CM | POA: Diagnosis not present

## 2018-07-21 DIAGNOSIS — Z951 Presence of aortocoronary bypass graft: Secondary | ICD-10-CM | POA: Diagnosis not present

## 2018-07-21 DIAGNOSIS — I10 Essential (primary) hypertension: Secondary | ICD-10-CM | POA: Diagnosis not present

## 2018-07-21 DIAGNOSIS — Z87891 Personal history of nicotine dependence: Secondary | ICD-10-CM | POA: Diagnosis not present

## 2018-07-21 DIAGNOSIS — I2510 Atherosclerotic heart disease of native coronary artery without angina pectoris: Secondary | ICD-10-CM | POA: Diagnosis not present

## 2018-07-24 ENCOUNTER — Encounter (HOSPITAL_COMMUNITY)
Admission: RE | Admit: 2018-07-24 | Discharge: 2018-07-24 | Disposition: A | Discharge status: Home / Self Care | Payer: Medicare HMO | Source: Ambulatory Visit | Attending: Cardiology | Admitting: Cardiology

## 2018-07-24 ENCOUNTER — Encounter (HOSPITAL_COMMUNITY): Payer: Medicare HMO

## 2018-07-24 DIAGNOSIS — E785 Hyperlipidemia, unspecified: Secondary | ICD-10-CM | POA: Diagnosis not present

## 2018-07-24 DIAGNOSIS — Z951 Presence of aortocoronary bypass graft: Secondary | ICD-10-CM | POA: Diagnosis not present

## 2018-07-24 DIAGNOSIS — Z87891 Personal history of nicotine dependence: Secondary | ICD-10-CM | POA: Diagnosis not present

## 2018-07-24 DIAGNOSIS — I10 Essential (primary) hypertension: Secondary | ICD-10-CM | POA: Diagnosis not present

## 2018-07-24 DIAGNOSIS — I2510 Atherosclerotic heart disease of native coronary artery without angina pectoris: Secondary | ICD-10-CM | POA: Diagnosis not present

## 2018-07-26 ENCOUNTER — Encounter (HOSPITAL_COMMUNITY)
Admission: RE | Admit: 2018-07-26 | Discharge: 2018-07-26 | Disposition: A | Discharge status: Home / Self Care | Payer: Medicare HMO | Source: Ambulatory Visit | Attending: Cardiology | Admitting: Cardiology

## 2018-07-26 ENCOUNTER — Encounter (HOSPITAL_COMMUNITY): Payer: Medicare HMO

## 2018-07-26 DIAGNOSIS — Z87891 Personal history of nicotine dependence: Secondary | ICD-10-CM | POA: Diagnosis not present

## 2018-07-26 DIAGNOSIS — Z951 Presence of aortocoronary bypass graft: Secondary | ICD-10-CM | POA: Diagnosis not present

## 2018-07-26 DIAGNOSIS — I2510 Atherosclerotic heart disease of native coronary artery without angina pectoris: Secondary | ICD-10-CM | POA: Diagnosis not present

## 2018-07-26 DIAGNOSIS — I10 Essential (primary) hypertension: Secondary | ICD-10-CM | POA: Diagnosis not present

## 2018-07-26 DIAGNOSIS — E785 Hyperlipidemia, unspecified: Secondary | ICD-10-CM | POA: Diagnosis not present

## 2018-07-28 ENCOUNTER — Encounter (HOSPITAL_COMMUNITY)
Admission: RE | Admit: 2018-07-28 | Discharge: 2018-07-28 | Disposition: A | Discharge status: Home / Self Care | Payer: Medicare HMO | Source: Ambulatory Visit | Attending: Cardiology | Admitting: Cardiology

## 2018-07-28 ENCOUNTER — Encounter (HOSPITAL_COMMUNITY): Payer: Medicare HMO

## 2018-07-28 ENCOUNTER — Encounter (HOSPITAL_COMMUNITY): Payer: Self-pay

## 2018-07-28 DIAGNOSIS — E785 Hyperlipidemia, unspecified: Secondary | ICD-10-CM | POA: Diagnosis not present

## 2018-07-28 DIAGNOSIS — Z951 Presence of aortocoronary bypass graft: Secondary | ICD-10-CM | POA: Diagnosis not present

## 2018-07-28 DIAGNOSIS — Z87891 Personal history of nicotine dependence: Secondary | ICD-10-CM | POA: Diagnosis not present

## 2018-07-28 DIAGNOSIS — I2510 Atherosclerotic heart disease of native coronary artery without angina pectoris: Secondary | ICD-10-CM | POA: Diagnosis not present

## 2018-07-28 DIAGNOSIS — I10 Essential (primary) hypertension: Secondary | ICD-10-CM | POA: Diagnosis not present

## 2018-08-02 ENCOUNTER — Encounter (HOSPITAL_COMMUNITY): Payer: Medicare HMO

## 2018-08-02 ENCOUNTER — Encounter (HOSPITAL_COMMUNITY)
Admission: RE | Admit: 2018-08-02 | Discharge: 2018-08-02 | Disposition: A | Discharge status: Home / Self Care | Payer: Medicare HMO | Source: Ambulatory Visit | Attending: Cardiology | Admitting: Cardiology

## 2018-08-02 DIAGNOSIS — Z951 Presence of aortocoronary bypass graft: Secondary | ICD-10-CM

## 2018-08-02 DIAGNOSIS — E785 Hyperlipidemia, unspecified: Secondary | ICD-10-CM | POA: Diagnosis not present

## 2018-08-02 DIAGNOSIS — I10 Essential (primary) hypertension: Secondary | ICD-10-CM | POA: Diagnosis not present

## 2018-08-02 DIAGNOSIS — Z87891 Personal history of nicotine dependence: Secondary | ICD-10-CM | POA: Diagnosis not present

## 2018-08-02 DIAGNOSIS — I2510 Atherosclerotic heart disease of native coronary artery without angina pectoris: Secondary | ICD-10-CM | POA: Insufficient documentation

## 2018-08-03 NOTE — Progress Notes (Signed)
Cardiac Individual Treatment Plan  Patient Details  Name: Vincent Stevens MRN: [id number] Date of Birth: 1946-01-19 Referring Provider:     [place] from 06/15/2018 in [place]  Referring Provider  [name]       Initial Encounter Date:    CARDIAC REHAB PHASE II ORIENTATION from 06/15/2018 in [place]  Date  06/15/18      Visit Diagnosis: 03/11/18 S/P CABG x 3  Patient's Home Medications on Admission:  Current Outpatient Medications:  .  aspirin EC 325 MG EC tablet, Take 1 tablet (325 mg total) by mouth daily., Disp: 30 tablet, Rfl: 0 .  Multiple Vitamin (MULTIVITAMIN) tablet, Take 1 tablet by mouth daily., Disp: 90 tablet, Rfl: 3 .  rosuvastatin (CRESTOR) 20 MG tablet, Take 1 tablet (20 mg total) by mouth daily., Disp: 90 tablet, Rfl: 3 .  tamsulosin (FLOMAX) 0.4 MG CAPS capsule, TAKE 1 CAPSULE (0.4 MG TOTAL) BY MOUTH DAILY., Disp: 90 capsule, Rfl: 1 .  triamcinolone cream (KENALOG) 0.1 %, Apply 1 application topically 2 (two) times daily. (Patient not taking: Reported on 06/07/2018), Disp: 45 g, Rfl: 2  Past Medical History: Past Medical History:  Diagnosis Date  . Allergy   . Benign prostatic hypertrophy   . BENIGN PROSTATIC HYPERTROPHY 09/19/2009   Qualifier: Diagnosis of  By: Burnice Logan  MD, Doretha Sou   . Chest pain 02/23/2018  . Dyslipidemia 08/14/2010   Qualifier: Diagnosis of  By: Burnice Logan  MD, Doretha Sou   . Essential hypertension 01/17/2009   Qualifier: Diagnosis of  By: Burnice Logan  MD, Doretha Sou   . Gout   . GOUT, ACUTE 11/07/2009   Qualifier: Diagnosis of  By: Burnice Logan  MD, Doretha Sou   . History of colonic polyps 09/27/2008   Qualifier: Diagnosis of  By: Burnice Logan  MD, Doretha Sou  Initial colonoscopy 2003.  Hyperplastic polyps only Followup colonoscopy 2008.  Normal  10 year interval recommended   . Hyperlipidemia   . Hypertension   . Impaired glucose tolerance  02/20/2013  . Left main coronary artery disease   . SOB (shortness of breath) 02/23/2018    Tobacco Use: Social History   Tobacco Use  Smoking Status Former Smoker  . Last attempt to quit: 11/29/1980  . Years since quitting: 37.7  Smokeless Tobacco Never Used    Labs: Recent Review Flowsheet Data    Labs for ITP Cardiac and Pulmonary Rehab Latest Ref Rng & Units 03/02/2018 03/02/2018 03/02/2018 03/20/2018 04/19/2018   Cholestrol 100 - 199 mg/dL - - - 144 134   LDLCALC 0 - 99 mg/dL - - - 78 70   HDL >39 mg/dL - - - 40 43   Trlycerides 0 - 149 mg/dL - - - 129 105   Hemoglobin A1c 4.6 - 6.5 % - - - - -   PHART 7.350 - 7.450 7.336(L) 7.336(L) - - -   PCO2ART 32.0 - 48.0 mmHg 39.4 40.5 - - -   HCO3 20.0 - 28.0 mmol/L 21.0 21.6 - - -   TCO2 22 - 32 mmol/L 22 23 23  - -   ACIDBASEDEF 0.0 - 2.0 mmol/L 4.0(H) 4.0(H) - - -   O2SAT % 99.0 98.0 - - -      Capillary Blood Glucose: Lab Results  Component Value Date   GLUCAP 99 03/05/2018   GLUCAP 113 (H) 03/05/2018   GLUCAP 104 (H) 03/04/2018   GLUCAP 106 (  H) 03/04/2018   GLUCAP 110 (H) 03/04/2018     Exercise Target Goals: Exercise Program Goal: Individual exercise prescription set using results from initial 6 min walk test and THRR while considering  patient's activity barriers and safety.   Exercise Prescription Goal: Initial exercise prescription builds to 30-45 minutes a day of aerobic activity, 2-3 days per week.  Home exercise guidelines will be given to patient during program as part of exercise prescription that the participant will acknowledge.  Activity Barriers & Risk Stratification: Activity Barriers & Cardiac Risk Stratification - 06/15/18 0938      Activity Barriers & Cardiac Risk Stratification   Comments  B knee discomfort, L shoulder pain/limitations       6 Minute Walk: 6 Minute Walk    Row Name 06/15/18 0927         6 Minute Walk   Phase  Initial     Distance  1725 feet     Walk Time  6 minutes     # of  Rest Breaks  0     MPH  3.26     METS  3.36     RPE  12     Perceived Dyspnea   0     VO2 Peak  11.77     Symptoms  Yes (comment)     Comments  Left Knee Pain +6     Resting HR  71 bpm     Resting BP  102/70     Resting Oxygen Saturation   97 %     Exercise Oxygen Saturation  during 6 min walk  96 %     Max Ex. HR  109 bpm     Max Ex. BP  122/80     2 Minute Post BP  122/70        Oxygen Initial Assessment:   Oxygen Re-Evaluation:   Oxygen Discharge (Final Oxygen Re-Evaluation):   Initial Exercise Prescription: Initial Exercise Prescription - 06/15/18 0900      Date of Initial Exercise RX and Referring Provider   Date  06/15/18    Referring Provider  [name]     Expected Discharge Date  09/22/18      Bike   Level  0.8    Minutes  10    METs  2.68      NuStep   Level  2    SPM  75    Minutes  10    METs  2.5      Track   Laps  13    Minutes  10    METs  3.3      Prescription Details   Frequency (times per week)  3x    Duration  Progress to 30 minutes of continuous aerobic without signs/symptoms of physical distress      Intensity   THRR 40-80% of Max Heartrate  59-118    Ratings of Perceived Exertion  11-13    Perceived Dyspnea  0-4      Progression   Progression  Continue progressive overload as per policy without signs/symptoms or physical distress.      Resistance Training   Training Prescription  Yes    Weight  3lbs    Reps  10-15       Perform Capillary Blood Glucose checks as needed.  Exercise Prescription Changes: Exercise Prescription Changes    Row Name 06/21/18 1000 07/03/18 1455 07/19/18 1544 07/28/18 1725  Response to Exercise   Blood Pressure (Admit)  136/70  126/80  112/70  102/60    Blood Pressure (Exercise)  120/78  126/70  110/62  120/70    Blood Pressure (Exit)  118/70  120/72  102/60  112/76    Heart Rate (Admit)  73 bpm  82 bpm  84 bpm  65 bpm    Heart Rate (Exercise)  105 bpm  106 bpm  102 bpm  100  bpm    Heart Rate (Exit)  68 bpm  82 bpm  74 bpm  76 bpm    Rating of Perceived Exertion (Exercise)  13  13  11  12     Perceived Dyspnea (Exercise)  0  0  0  0    Symptoms  None   None   None  None    Comments  Pt oriented to exercise equipment   -  None   -    Duration  Progress to 30 minutes of  aerobic without signs/symptoms of physical distress  Continue with 30 min of aerobic exercise without signs/symptoms of physical distress.  Continue with 30 min of aerobic exercise without signs/symptoms of physical distress.  Continue with 30 min of aerobic exercise without signs/symptoms of physical distress.    Intensity  THRR New  THRR unchanged  THRR unchanged  THRR unchanged      Progression   Progression  Continue to progress workloads to maintain intensity without signs/symptoms of physical distress.  Continue to progress workloads to maintain intensity without signs/symptoms of physical distress.  Continue to progress workloads to maintain intensity without signs/symptoms of physical distress.  Continue to progress workloads to maintain intensity without signs/symptoms of physical distress.    Average METs  2.8  3.1  3.13  3.29      Resistance Training   Training Prescription  No  Yes  No  Yes    Weight  -  3lbs  -  3lbs    Reps  -  10-15  -  10-15    Time  -  10 Minutes  -  10 Minutes      Interval Training   Interval Training  No  No  No  No      Bike   Level  0.8  0.8  0.8  0.8    Minutes  10  10  10  10     METs  2.69  2.69  2.69  2.69      NuStep   Level  2  3  4  4     SPM  85  95  95  95    Minutes  10  10  10  10     METs  2.1  2.9  3.1  3.4      Track   Laps  15  16  15  15     Minutes  10  10  10  10     METs  3.61  3.78  3.61  3.61      Home Exercise Plan   Plans to continue exercise at  -  -  Home (comment) Walking  Home (comment) Walking    Frequency  -  -  Add 2 additional days to program exercise sessions.  Add 2 additional days to program exercise sessions.     Initial Home Exercises Provided  -  -  07/12/18  07/12/18       Exercise Comments: Exercise Comments  [place] Name 06/21/18 0923 07/05/18 1456 07/19/18 1037       Exercise Comments  Pt's first day of exercise. Pt oriented to exercise equipment. Pt responded well to exercise prescription. Will continue to monitor and progress pt as tolerated.   Pt is continuing to respond well to exercise prescription. Will follow up with pt regarding home exercise plans. Pt states he is enjoying rehab and will stay in program longer than the 1 month he intended to. Will continue to monitor pt.   Reviewed HEP with pt. Pt is currently walking with family for exercise. Pt will work to increase walking time to 30 mins. Will continue to monitor and progress pt as tolerated.         Exercise Goals and Review: Exercise Goals    Row Name 06/15/18 3007             Exercise Goals   Increase Physical Activity  Yes       Intervention  Provide advice, education, support and counseling about physical activity/exercise needs.;Develop an individualized exercise prescription for aerobic and resistive training based on initial evaluation findings, risk stratification, comorbidities and participant's personal goals.       Expected Outcomes  Short Term: Attend rehab on a regular basis to increase amount of physical activity.;Long Term: Add in home exercise to make exercise part of routine and to increase amount of physical activity.;Long Term: Exercising regularly at least 3-5 days a week.       Increase Strength and Stamina  Yes increase muscle tone       Intervention  Provide advice, education, support and counseling about physical activity/exercise needs.;Develop an individualized exercise prescription for aerobic and resistive training based on initial evaluation findings, risk stratification, comorbidities and participant's personal goals.       Expected Outcomes  Short Term: Increase workloads from initial exercise  prescription for resistance, speed, and METs.;Short Term: Perform resistance training exercises routinely during rehab and add in resistance training at home;Long Term: Improve cardiorespiratory fitness, muscular endurance and strength as measured by increased METs and functional capacity (6MWT)       Able to understand and use rate of perceived exertion (RPE) scale  Yes       Intervention  Provide education and explanation on how to use RPE scale       Expected Outcomes  Short Term: Able to use RPE daily in rehab to express subjective intensity level;Long Term:  Able to use RPE to guide intensity level when exercising independently       Knowledge and understanding of Target Heart Rate Range (THRR)  Yes       Intervention  Provide education and explanation of THRR including how the numbers were predicted and where they are located for reference       Expected Outcomes  Short Term: Able to state/look up THRR;Long Term: Able to use THRR to govern intensity when exercising independently;Short Term: Able to use daily as guideline for intensity in rehab       Able to check pulse independently  Yes       Intervention  Provide education and demonstration on how to check pulse in carotid and radial arteries.;Review the importance of being able to check your own pulse for safety during independent exercise       Expected Outcomes  Short Term: Able to explain why pulse checking is important during independent exercise;Long Term: Able to check pulse independently and accurately  Exercise Goals Re-Evaluation : Exercise Goals Re-Evaluation    [place] Name 07/19/18 1039             Exercise Goal Re-Evaluation   Exercise Goals Review  Increase Physical Activity;Able to understand and use rate of perceived exertion (RPE) scale;Knowledge and understanding of Target Heart Rate Range (THRR);Understanding of Exercise Prescription;Increase Strength and Stamina;Able to check pulse independently;Improve  claudication pain tolerance and improve walking ability       Comments  Reviewed HEP with pt. Also reviewed THRR, RPE Scale, weather precautions, endpoints of exercise, NTG use, warmup and cool down.        Expected Outcomes  Pt will continue to walk 2-3 days a week for 20-25 minutes. Pt will continue to build stamina and strength. Will continue to monitor.           Discharge Exercise Prescription (Final Exercise Prescription Changes): Exercise Prescription Changes - 07/28/18 1725      Response to Exercise   Blood Pressure (Admit)  102/60    Blood Pressure (Exercise)  120/70    Blood Pressure (Exit)  112/76    Heart Rate (Admit)  65 bpm    Heart Rate (Exercise)  100 bpm    Heart Rate (Exit)  76 bpm    Rating of Perceived Exertion (Exercise)  12    Perceived Dyspnea (Exercise)  0    Symptoms  None    Duration  Continue with 30 min of aerobic exercise without signs/symptoms of physical distress.    Intensity  THRR unchanged      Progression   Progression  Continue to progress workloads to maintain intensity without signs/symptoms of physical distress.    Average METs  3.29      Resistance Training   Training Prescription  Yes    Weight  3lbs    Reps  10-15    Time  10 Minutes      Interval Training   Interval Training  No      Bike   Level  0.8    Minutes  10    METs  2.69      NuStep   Level  4    SPM  95    Minutes  10    METs  3.4      Track   Laps  15    Minutes  10    METs  3.61      Home Exercise Plan   Plans to continue exercise at  Home (comment)   Walking   Frequency  Add 2 additional days to program exercise sessions.    Initial Home Exercises Provided  07/12/18       Nutrition:  Target Goals: Understanding of nutrition guidelines, daily intake of sodium 1500mg , cholesterol 200mg , calories 30% from fat and 7% or less from saturated fats, daily to have 5 or more servings of fruits and vegetables.  Biometrics: Pre Biometrics - 06/15/18 0928       Pre Biometrics   Height  5\' 8"  (1.727 m)    Weight  89.3 kg    Waist Circumference  42.5 inches    Hip Circumference  41.25 inches    Waist to Hip Ratio  1.03 %    BMI (Calculated)  29.94    Triceps Skinfold  26 mm    % Body Fat  32 %    Grip Strength  35 kg    Flexibility  0 in    Single Leg Stand  2.12 seconds        Nutrition Therapy Plan and Nutrition Goals: Nutrition Therapy & Goals - 06/15/18 1124      Nutrition Therapy   Diet  consistent carbohydrate heart healthy      Personal Nutrition Goals   Nutrition Goal  Pt to identify food quantities necessary to achieve weight loss of 6-24 lbs. at graduation from cardiac rehab. Goal wt of 10 lb desired.     Personal Goal #2  Pt to identify and limit food sources of saturated fat, trans fat, and sodium      Intervention Plan   Intervention  Prescribe, educate and counsel regarding individualized specific dietary modifications aiming towards targeted core components such as weight, hypertension, lipid management, diabetes, heart failure and other comorbidities.    Expected Outcomes  Short Term Goal: Understand basic principles of dietary content, such as calories, fat, sodium, cholesterol and nutrients.       Nutrition Assessments: Nutrition Assessments - 06/15/18 1115      MEDFICTS Scores   Pre Score  18       Nutrition Goals Re-Evaluation: Nutrition Goals Re-Evaluation    [place] Name 06/15/18 1114 06/15/18 1124           Goals   Current Weight  196 lb 13.9 oz (89.3 kg)  196 lb 13.9 oz (89.3 kg)         Nutrition Goals Re-Evaluation: Nutrition Goals Re-Evaluation    [place] Name 06/15/18 1114 06/15/18 1124           Goals   Current Weight  196 lb 13.9 oz (89.3 kg)  196 lb 13.9 oz (89.3 kg)         Nutrition Goals Discharge (Final Nutrition Goals Re-Evaluation): Nutrition Goals Re-Evaluation - 06/15/18 1124      Goals   Current Weight  196 lb 13.9 oz (89.3 kg)       Psychosocial: Target Goals:  Acknowledge presence or absence of significant depression and/or stress, maximize coping skills, provide positive support system. Participant is able to verbalize types and ability to use techniques and skills needed for reducing stress and depression.  Initial Review & Psychosocial Screening: Initial Psych Review & Screening - 06/15/18 0838      Initial Review   Current issues with  None Identified      Family Dynamics   Good Support System?  Yes   [place] details his wife and daughter as sources of support.      Barriers   Psychosocial barriers to participate in program  There are no identifiable barriers or psychosocial needs.      Screening Interventions   Interventions  Encouraged to exercise       Quality of Life Scores: Quality of Life - 06/15/18 0836      Quality of Life   Select  Quality of Life      Quality of Life Scores   Health/Function Pre  26.4 %    Socioeconomic Pre  29.14 %    Psych/Spiritual Pre  25.71 %    Family Pre  28.5 %    GLOBAL Pre  27.13 %      Scores of 19 and below usually indicate a poorer quality of life in these areas.  A difference of  2-3 points is a clinically meaningful difference.  A difference of 2-3 points in the total score of the Quality of Life Index has been associated with significant improvement in overall quality of life, self-image, physical symptoms,  and general health in studies assessing change in quality of life.  PHQ-9: Recent Review Flowsheet Data    Depression screen [facility] 2/9 06/21/2018 06/21/2016 06/09/2015   Decreased Interest 0 0 0   Down, Depressed, Hopeless 0 0 0   PHQ - 2 Score 0 0 0     Interpretation of Total Score  Total Score Depression Severity:  1-4 = Minimal depression, 5-9 = Mild depression, 10-14 = Moderate depression, 15-19 = Moderately severe depression, 20-27 = Severe depression   Psychosocial Evaluation and Intervention: Psychosocial Evaluation - 06/21/18 0747      Psychosocial Evaluation &  Interventions   Interventions  Encouraged to exercise with the program and follow exercise prescription    Comments  No psychosocial needs identified.  No interventions necessary.     Expected Outcomes  Pt will exhibit a positive outlook with good coping skills.    Continue Psychosocial Services   No Follow up required       Psychosocial Re-Evaluation: Psychosocial Re-Evaluation    [place] Name 07/05/18 1116 07/28/18 1620           Psychosocial Re-Evaluation   Current issues with  None Identified  None Identified      Comments  No psychosocial needs identified.   No psychosocial needs identified.       Expected Outcomes  Vincent Stevens will maintain a positive outlook with good coping skills.   Vincent Stevens will maintain a positive outlook with good coping skills.       Interventions  Encouraged to attend Cardiac Rehabilitation for the exercise  Encouraged to attend Cardiac Rehabilitation for the exercise      Continue Psychosocial Services   No Follow up required  No Follow up required         Psychosocial Discharge (Final Psychosocial Re-Evaluation): Psychosocial Re-Evaluation - 07/28/18 1620      Psychosocial Re-Evaluation   Current issues with  None Identified    Comments  No psychosocial needs identified.     Expected Outcomes  Vincent Stevens will maintain a positive outlook with good coping skills.     Interventions  Encouraged to attend Cardiac Rehabilitation for the exercise    Continue Psychosocial Services   No Follow up required       Vocational Rehabilitation: Provide vocational rehab assistance to qualifying candidates.   Vocational Rehab Evaluation & Intervention: Vocational Rehab - 06/15/18 0836      Initial Vocational Rehab Evaluation & Intervention   Assessment shows need for Vocational Rehabilitation  No       Education: Education Goals: Education classes will be provided on a weekly basis, covering required topics. Participant will state understanding/return demonstration of topics  presented.  Learning Barriers/Preferences: Learning Barriers/Preferences - 06/15/18 3295      Learning Barriers/Preferences   Learning Barriers  Sight    Learning Preferences  Skilled Demonstration       Education Topics: Count Your Pulse:  -Group instruction provided by verbal instruction, demonstration, patient participation and written materials to support subject.  Instructors address importance of being able to find your pulse and how to count your pulse when at home without a heart monitor.  Patients get hands on experience counting their pulse with staff help and individually.   Heart Attack, Angina, and Risk Factor Modification:  -Group instruction provided by verbal instruction, video, and written materials to support subject.  Instructors address signs and symptoms of angina and heart attacks.    Also discuss risk factors for heart disease and how  to make changes to improve heart health risk factors.   Functional Fitness:  -Group instruction provided by verbal instruction, demonstration, patient participation, and written materials to support subject.  Instructors address safety measures for doing things around the house.  Discuss how to get up and down off the floor, how to pick things up properly, how to safely get out of a chair without assistance, and balance training.   Meditation and Mindfulness:  -Group instruction provided by verbal instruction, patient participation, and written materials to support subject.  Instructor addresses importance of mindfulness and meditation practice to help reduce stress and improve awareness.  Instructor also leads participants through a meditation exercise.    Stretching for Flexibility and Mobility:  -Group instruction provided by verbal instruction, patient participation, and written materials to support subject.  Instructors lead participants through series of stretches that are designed to increase flexibility thus improving  mobility.  These stretches are additional exercise for major muscle groups that are typically performed during regular warm up and cool down.   Hands Only CPR:  -Group verbal, video, and participation provides a basic overview of AHA guidelines for community CPR. Role-play of emergencies allow participants the opportunity to practice calling for help and chest compression technique with discussion of AED use.   Hypertension: -Group verbal and written instruction that provides a basic overview of hypertension including the most recent diagnostic guidelines, risk factor reduction with self-care instructions and medication management.    Nutrition I class: Heart Healthy Eating:  -Group instruction provided by PowerPoint slides, verbal discussion, and written materials to support subject matter. The instructor gives an explanation and review of the Therapeutic Lifestyle Changes diet recommendations, which includes a discussion on lipid goals, dietary fat, sodium, fiber, plant stanol/sterol esters, sugar, and the components of a well-balanced, healthy diet.   Nutrition II class: Lifestyle Skills:  -Group instruction provided by PowerPoint slides, verbal discussion, and written materials to support subject matter. The instructor gives an explanation and review of label reading, grocery shopping for heart health, heart healthy recipe modifications, and ways to make healthier choices when eating out.   Diabetes Question & Answer:  -Group instruction provided by PowerPoint slides, verbal discussion, and written materials to support subject matter. The instructor gives an explanation and review of diabetes co-morbidities, pre- and post-prandial blood glucose goals, pre-exercise blood glucose goals, signs, symptoms, and treatment of hypoglycemia and hyperglycemia, and foot care basics.   Diabetes Blitz:  -Group instruction provided by PowerPoint slides, verbal discussion, and written materials to  support subject matter. The instructor gives an explanation and review of the physiology behind type 1 and type 2 diabetes, diabetes medications and rational behind using different medications, pre- and post-prandial blood glucose recommendations and Hemoglobin A1c goals, diabetes diet, and exercise including blood glucose guidelines for exercising safely.    Portion Distortion:  -Group instruction provided by PowerPoint slides, verbal discussion, written materials, and food models to support subject matter. The instructor gives an explanation of serving size versus portion size, changes in portions sizes over the last 20 years, and what consists of a serving from each food group.   Stress Management:  -Group instruction provided by verbal instruction, video, and written materials to support subject matter.  Instructors review role of stress in heart disease and how to cope with stress positively.     Exercising on Your Own:  -Group instruction provided by verbal instruction, power point, and written materials to support subject.  Instructors discuss benefits of exercise,  components of exercise, frequency and intensity of exercise, and end points for exercise.  Also discuss use of nitroglycerin and activating EMS.  Review options of places to exercise outside of rehab.  Review guidelines for sex with heart disease.   Cardiac Drugs I:  -Group instruction provided by verbal instruction and written materials to support subject.  Instructor reviews cardiac drug classes: antiplatelets, anticoagulants, beta blockers, and statins.  Instructor discusses reasons, side effects, and lifestyle considerations for each drug class.   Cardiac Drugs II:  -Group instruction provided by verbal instruction and written materials to support subject.  Instructor reviews cardiac drug classes: angiotensin converting enzyme inhibitors (ACE-I), angiotensin II receptor blockers (ARBs), nitrates, and calcium channel  blockers.  Instructor discusses reasons, side effects, and lifestyle considerations for each drug class.   Anatomy and Physiology of the Circulatory System:  Group verbal and written instruction and models provide basic cardiac anatomy and physiology, with the coronary electrical and arterial systems. Review of: AMI, Angina, Valve disease, Heart Failure, Peripheral Artery Disease, Cardiac Arrhythmia, Pacemakers, and the ICD.   Other Education:  -Group or individual verbal, written, or video instructions that support the educational goals of the cardiac rehab program.   Holiday Eating Survival Tips:  -Group instruction provided by PowerPoint slides, verbal discussion, and written materials to support subject matter. The instructor gives patients tips, tricks, and techniques to help them not only survive but enjoy the holidays despite the onslaught of food that accompanies the holidays.   Knowledge Questionnaire Score: Knowledge Questionnaire Score - 06/15/18 0836      Knowledge Questionnaire Score   Pre Score  21/24       Core Components/Risk Factors/Patient Goals at Admission: Personal Goals and Risk Factors at Admission - 06/15/18 0941      Core Components/Risk Factors/Patient Goals on Admission    Weight Management  Weight Loss       Core Components/Risk Factors/Patient Goals Review:  Goals and Risk Factor Review    Row Name 06/21/18 0743 07/05/18 1115 07/28/18 1620         Core Components/Risk Factors/Patient Goals Review   Personal Goals Review  Weight Management/Obesity;Lipids  Weight Management/Obesity;Lipids  Weight Management/Obesity;Lipids     Review  Vincent Stevens with few CAD RFs willing to participate in CR exercise. Vincent Stevens would like to rebuild muscles.  Vincent Stevens with few CAD RFs willing to participate in CR exercise. Vincent Stevens is tolerating exercise well.  He originally had mentioned only participating in program for a month but since starting Vincent Stevens has changed his mind and would like  to continue.   Vincent Stevens with few CAD RFs willing to participate in CR exercise. Vincent Stevens is tolerating exercise well.  He feels that he is increasing his strength.  He has great attendance and feels that he is establishing an exercise routine.      Expected Outcomes  Pt will continue to participate in CR exercise, nutriton, and lifestyle opportunities.   Pt will continue to participate in CR exercise, nutriton, and lifestyle opportunities.   Pt will continue to participate in CR exercise, nutriton, and lifestyle opportunities.         Core Components/Risk Factors/Patient Goals at Discharge (Final Review):  Goals and Risk Factor Review - 07/28/18 1620      Core Components/Risk Factors/Patient Goals Review   Personal Goals Review  Weight Management/Obesity;Lipids    Review  Vincent Stevens with few CAD RFs willing to participate in CR exercise. Vincent Stevens is tolerating exercise well.  He feels that he  is increasing his strength.  He has great attendance and feels that he is establishing an exercise routine.     Expected Outcomes  Pt will continue to participate in CR exercise, nutriton, and lifestyle opportunities.        ITP Comments: ITP Comments    Row Name 06/15/18 0810 06/21/18 0743 07/05/18 1114 07/28/18 1620     ITP Comments  Dr.. Fransico Him, Medical Director  Vincent Stevens started exercise today and he tolerated exercise well.   30 Day ITP Review.  Vincent Stevens is tolerating exercise well with his recent start.    30 Day ITP Review.  Vincent Stevens is tolerating exercise well.  He feels that he is increasing his strength and establishing a routine.  He has great attendance.        Comments: See ITP Comments.

## 2018-08-04 ENCOUNTER — Encounter (HOSPITAL_COMMUNITY)
Admission: RE | Admit: 2018-08-04 | Discharge: 2018-08-04 | Disposition: A | Discharge status: Home / Self Care | Payer: Medicare HMO | Source: Ambulatory Visit | Attending: Cardiology | Admitting: Cardiology

## 2018-08-04 ENCOUNTER — Encounter (HOSPITAL_COMMUNITY): Payer: Medicare HMO

## 2018-08-04 DIAGNOSIS — I2510 Atherosclerotic heart disease of native coronary artery without angina pectoris: Secondary | ICD-10-CM | POA: Diagnosis not present

## 2018-08-04 DIAGNOSIS — E785 Hyperlipidemia, unspecified: Secondary | ICD-10-CM | POA: Diagnosis not present

## 2018-08-04 DIAGNOSIS — Z87891 Personal history of nicotine dependence: Secondary | ICD-10-CM | POA: Diagnosis not present

## 2018-08-04 DIAGNOSIS — Z951 Presence of aortocoronary bypass graft: Secondary | ICD-10-CM

## 2018-08-04 DIAGNOSIS — I10 Essential (primary) hypertension: Secondary | ICD-10-CM | POA: Diagnosis not present

## 2018-08-07 ENCOUNTER — Encounter (HOSPITAL_COMMUNITY): Payer: Medicare HMO

## 2018-08-07 ENCOUNTER — Encounter (HOSPITAL_COMMUNITY)
Admission: RE | Admit: 2018-08-07 | Discharge: 2018-08-07 | Disposition: A | Discharge status: Home / Self Care | Payer: Medicare HMO | Source: Ambulatory Visit | Attending: Cardiology | Admitting: Cardiology

## 2018-08-07 DIAGNOSIS — I2510 Atherosclerotic heart disease of native coronary artery without angina pectoris: Secondary | ICD-10-CM | POA: Diagnosis not present

## 2018-08-07 DIAGNOSIS — E785 Hyperlipidemia, unspecified: Secondary | ICD-10-CM | POA: Diagnosis not present

## 2018-08-07 DIAGNOSIS — Z951 Presence of aortocoronary bypass graft: Secondary | ICD-10-CM

## 2018-08-07 DIAGNOSIS — Z87891 Personal history of nicotine dependence: Secondary | ICD-10-CM | POA: Diagnosis not present

## 2018-08-07 DIAGNOSIS — I10 Essential (primary) hypertension: Secondary | ICD-10-CM | POA: Diagnosis not present

## 2018-08-09 ENCOUNTER — Encounter (HOSPITAL_COMMUNITY): Payer: Medicare HMO

## 2018-08-09 ENCOUNTER — Encounter (HOSPITAL_COMMUNITY)
Admission: RE | Admit: 2018-08-09 | Discharge: 2018-08-09 | Disposition: A | Discharge status: Home / Self Care | Payer: Medicare HMO | Source: Ambulatory Visit | Attending: Cardiology | Admitting: Cardiology

## 2018-08-09 DIAGNOSIS — E785 Hyperlipidemia, unspecified: Secondary | ICD-10-CM | POA: Diagnosis not present

## 2018-08-09 DIAGNOSIS — I2510 Atherosclerotic heart disease of native coronary artery without angina pectoris: Secondary | ICD-10-CM | POA: Diagnosis not present

## 2018-08-09 DIAGNOSIS — I10 Essential (primary) hypertension: Secondary | ICD-10-CM | POA: Diagnosis not present

## 2018-08-09 DIAGNOSIS — Z87891 Personal history of nicotine dependence: Secondary | ICD-10-CM | POA: Diagnosis not present

## 2018-08-09 DIAGNOSIS — Z951 Presence of aortocoronary bypass graft: Secondary | ICD-10-CM | POA: Diagnosis not present

## 2018-08-11 ENCOUNTER — Encounter (HOSPITAL_COMMUNITY): Payer: Medicare HMO

## 2018-08-11 ENCOUNTER — Encounter (HOSPITAL_COMMUNITY)
Admission: RE | Admit: 2018-08-11 | Discharge: 2018-08-11 | Disposition: A | Discharge status: Home / Self Care | Payer: Medicare HMO | Source: Ambulatory Visit | Attending: Cardiology | Admitting: Cardiology

## 2018-08-11 DIAGNOSIS — Z951 Presence of aortocoronary bypass graft: Secondary | ICD-10-CM

## 2018-08-11 DIAGNOSIS — I10 Essential (primary) hypertension: Secondary | ICD-10-CM | POA: Diagnosis not present

## 2018-08-11 DIAGNOSIS — Z87891 Personal history of nicotine dependence: Secondary | ICD-10-CM | POA: Diagnosis not present

## 2018-08-11 DIAGNOSIS — E785 Hyperlipidemia, unspecified: Secondary | ICD-10-CM | POA: Diagnosis not present

## 2018-08-11 DIAGNOSIS — I2510 Atherosclerotic heart disease of native coronary artery without angina pectoris: Secondary | ICD-10-CM | POA: Diagnosis not present

## 2018-08-14 ENCOUNTER — Encounter (HOSPITAL_COMMUNITY): Payer: Medicare HMO

## 2018-08-14 ENCOUNTER — Encounter (HOSPITAL_COMMUNITY)
Admission: RE | Admit: 2018-08-14 | Discharge: 2018-08-14 | Disposition: A | Discharge status: Home / Self Care | Payer: Medicare HMO | Source: Ambulatory Visit | Attending: Cardiology | Admitting: Cardiology

## 2018-08-14 DIAGNOSIS — Z87891 Personal history of nicotine dependence: Secondary | ICD-10-CM | POA: Diagnosis not present

## 2018-08-14 DIAGNOSIS — Z951 Presence of aortocoronary bypass graft: Secondary | ICD-10-CM | POA: Diagnosis not present

## 2018-08-14 DIAGNOSIS — I10 Essential (primary) hypertension: Secondary | ICD-10-CM | POA: Diagnosis not present

## 2018-08-14 DIAGNOSIS — E785 Hyperlipidemia, unspecified: Secondary | ICD-10-CM | POA: Diagnosis not present

## 2018-08-14 DIAGNOSIS — I2510 Atherosclerotic heart disease of native coronary artery without angina pectoris: Secondary | ICD-10-CM | POA: Diagnosis not present

## 2018-08-15 ENCOUNTER — Ambulatory Visit (INDEPENDENT_AMBULATORY_CARE_PROVIDER_SITE_OTHER): Payer: Medicare HMO | Admitting: Family Medicine

## 2018-08-15 ENCOUNTER — Encounter: Payer: Self-pay | Admitting: Family Medicine

## 2018-08-15 VITALS — BP 120/74 | HR 62 | Temp 97.9°F | Ht 68.0 in | Wt 197.2 lb

## 2018-08-15 DIAGNOSIS — N400 Enlarged prostate without lower urinary tract symptoms: Secondary | ICD-10-CM | POA: Diagnosis not present

## 2018-08-15 DIAGNOSIS — M79604 Pain in right leg: Secondary | ICD-10-CM

## 2018-08-15 DIAGNOSIS — M109 Gout, unspecified: Secondary | ICD-10-CM | POA: Insufficient documentation

## 2018-08-15 DIAGNOSIS — E785 Hyperlipidemia, unspecified: Secondary | ICD-10-CM | POA: Diagnosis not present

## 2018-08-15 DIAGNOSIS — C439 Malignant melanoma of skin, unspecified: Secondary | ICD-10-CM | POA: Insufficient documentation

## 2018-08-15 DIAGNOSIS — Z0001 Encounter for general adult medical examination with abnormal findings: Secondary | ICD-10-CM | POA: Diagnosis not present

## 2018-08-15 DIAGNOSIS — Z23 Encounter for immunization: Secondary | ICD-10-CM

## 2018-08-15 DIAGNOSIS — M79605 Pain in left leg: Secondary | ICD-10-CM | POA: Diagnosis not present

## 2018-08-15 HISTORY — DX: Pain in right leg: M79.604

## 2018-08-15 NOTE — Assessment & Plan Note (Signed)
Concern for claudication.  He has sluggish, but intact distal cap refill.  No appreciable PT or DP pulses bilaterally.  We will check ABIs bilaterally to rule out PAD.  If negative, would consider further work-up for neurogenic claudication versus possible referral to physical therapy.

## 2018-08-15 NOTE — Assessment & Plan Note (Signed)
LDL 70 about 3 months ago.  Continue Crestor 20 mg daily.  Recheck lipid panel in 1 year.

## 2018-08-15 NOTE — Progress Notes (Signed)
Subjective:  Vincent Stevens is a 72 y.o. male who presents today for his annual comprehensive physical exam and to transfer care to this office.  HPI:  He has no acute complaints today. He has several chronic conditions outlined below:  1. Leg Pain.  Started 2 months ago.  Feels like a burning sensation in upper extremities and knees.  Occurs after working out.  Sometimes feels like a strain.  Occasionally has numbness and tingling in both of his feet.  2.  BPH.  On Flomax.  Tolerating well without side effects.  3.  Dyslipidemia/CAD status post CABG. On daily aspirin and Crestor.  No reported chest pain or shortness of breath.  Lifestyle Diet: No specific diets. Exercise: Goes to cardiopulmonary rehab.   Depression screen PHQ 2/9 06/21/2018  Decreased Interest 0  Down, Depressed, Hopeless 0  PHQ - 2 Score 0    Health Maintenance Due  Topic Date Due  . INFLUENZA VACCINE  06/29/2018     ROS: Per HPI, otherwise a complete review of systems was negative.   PMH:  The following were reviewed and entered/updated in epic: Past Medical History:  Diagnosis Date  . Allergy   . Benign prostatic hypertrophy   . BENIGN PROSTATIC HYPERTROPHY 09/19/2009   Qualifier: Diagnosis of  By: Burnice Logan  MD, Doretha Sou   . Chest pain 02/23/2018  . Dyslipidemia 08/14/2010   Qualifier: Diagnosis of  By: Burnice Logan  MD, Doretha Sou   . Essential hypertension 01/17/2009   Qualifier: Diagnosis of  By: Burnice Logan  MD, Doretha Sou   . Gout   . GOUT, ACUTE 11/07/2009   Qualifier: Diagnosis of  By: Burnice Logan  MD, Doretha Sou   . History of colonic polyps 09/27/2008   Qualifier: Diagnosis of  By: Burnice Logan  MD, Doretha Sou  Initial colonoscopy 2003.  Hyperplastic polyps only Followup colonoscopy 2008.  Normal  10 year interval recommended   . Hyperlipidemia   . Hypertension   . Impaired glucose tolerance 02/20/2013  . Left main coronary artery disease   . Melanoma ([place])   . SOB (shortness of breath)  02/23/2018   Patient Active Problem List   Diagnosis Date Noted  . Pain in both lower extremities 08/15/2018  . Gout   . Melanoma ([place])   . Angina, class III ([place]) 03/01/2018  . Hx of CABG 03/01/2018  . Coronary artery disease involving native coronary artery of native heart with angina pectoris ([place])   . Impaired glucose tolerance 02/20/2013  . Dyslipidemia 08/14/2010  . BPH (benign prostatic hyperplasia) 09/19/2009  . Essential hypertension 01/17/2009  . History of colonic polyps 09/27/2008   Past Surgical History:  Procedure Laterality Date  . CORONARY ARTERY BYPASS GRAFT N/A 03/01/2018   Procedure: CORONARY ARTERY BYPASS GRAFTING (CABG) times three using the right saphaneous vein. Harvested endoscopicly; and left internal mammary artery.;  Surgeon: Gaye Pollack, MD;  Location: [facility] OR;  Service: Open Heart Surgery;  Laterality: N/A;  . HERNIA REPAIR    . LEFT HEART CATH AND CORONARY ANGIOGRAPHY N/A 03/01/2018   Procedure: LEFT HEART CATH AND CORONARY ANGIOGRAPHY;  Surgeon: Leonie Man, MD;  Location: [facility];  Service: Cardiovascular;  Laterality: N/A;  . melanoma removal    . TEE WITHOUT CARDIOVERSION N/A 03/01/2018   Procedure: TRANSESOPHAGEAL ECHOCARDIOGRAM (TEE);  Surgeon: Gaye Pollack, MD;  Location: [facility];  Service: Open Heart Surgery;  Laterality: N/A;    Family History  Problem Relation Age of Onset  .  Diabetes Unknown   . Hypertension Unknown   . Heart attack Father   . Dementia Mother   . Cancer Sister        Skin    Medications- reviewed and updated Current Outpatient Medications  Medication Sig Dispense Refill  . aspirin EC 325 MG EC tablet Take 1 tablet (325 mg total) by mouth daily. 30 tablet 0  . Multiple Vitamin (MULTIVITAMIN) tablet Take 1 tablet by mouth daily. 90 tablet 3  . tamsulosin (FLOMAX) 0.4 MG CAPS capsule TAKE 1 CAPSULE (0.4 MG TOTAL) BY MOUTH DAILY. 90 capsule 1  . rosuvastatin (CRESTOR) 20 MG tablet Take 1 tablet (20 mg total)  by mouth daily. 90 tablet 3   No current facility-administered medications for this visit.     Allergies-reviewed and updated Allergies  Allergen Reactions  . Sulfamethoxazole Hives and Itching    Social History   Socioeconomic History  . Marital status: Married    Spouse name: Not on file  . Number of children: 1  . Years of education: Not on file  . Highest education level: Not on file  Occupational History  . Not on file  Social Needs  . Financial resource strain: Not on file  . Food insecurity:    Worry: Not on file    Inability: Not on file  . Transportation needs:    Medical: Not on file    Non-medical: Not on file  Tobacco Use  . Smoking status: Former Smoker    Last attempt to quit: 11/29/1980    Years since quitting: 37.7  . Smokeless tobacco: Never Used  Substance and Sexual Activity  . Alcohol use: No  . Drug use: No  . Sexual activity: Not on file  Lifestyle  . Physical activity:    Days per week: Not on file    Minutes per session: Not on file  . Stress: Not on file  Relationships  . Social connections:    Talks on phone: Not on file    Gets together: Not on file    Attends religious service: Not on file    Active member of club or organization: Not on file    Attends meetings of clubs or organizations: Not on file    Relationship status: Not on file  Other Topics Concern  . Not on file  Social History Narrative  . Not on file    Objective:  Physical Exam: BP 120/74 (BP Location: Left Arm, Patient Position: Sitting, Cuff Size: Normal)   Pulse 62   Temp 97.9 F (36.6 C) (Oral)   Ht 5\' 8"  (1.727 m)   Wt 197 lb 3.2 oz (89.4 kg)   SpO2 97%   BMI 29.98 kg/m   Body mass index is 29.98 kg/m. Wt Readings from Last 3 Encounters:  08/15/18 197 lb 3.2 oz (89.4 kg)  06/26/18 197 lb 12 oz (89.7 kg)  06/15/18 196 lb 13.9 oz (89.3 kg)   Gen: NAD, resting comfortably HEENT: TMs normal bilaterally. OP clear. No thyromegaly noted.  CV: RRR with no  murmurs appreciated Pulm: NWOB, CTAB with no crackles, wheezes, or rhonchi GI: Normal bowel sounds present. Soft, Nontender, Nondistended. MSK: no edema, cyanosis, or clubbing noted. LE with sluggish distal cap refill. No palpable PT or DP pulses Skin: warm, dry Neuro: CN2-12 grossly intact. Strength 5/5 in upper and lower extremities. Reflexes symmetric and intact bilaterally.  Psych: Normal affect and thought content  Assessment/Plan:  Pain in both lower extremities Concern for claudication.  He has sluggish, but intact distal cap refill.  No appreciable PT or DP pulses bilaterally.  We will check ABIs bilaterally to rule out PAD.  If negative, would consider further work-up for neurogenic claudication versus possible referral to physical therapy.  Melanoma ([place]) Stable.  Follows up with dermatology every 6 months.  BPH (benign prostatic hyperplasia) Stable on Flomax 0.4 mg daily.  Dyslipidemia LDL 70 about 3 months ago.  Continue Crestor 20 mg daily.  Recheck lipid panel in 1 year.  Preventative Healthcare: Flu shot given today.  Patient Counseling(The following topics were reviewed and/or handout was given):  -Nutrition: Stressed importance of moderation in sodium/caffeine intake, saturated fat and cholesterol, caloric balance, sufficient intake of fresh fruits, vegetables, and fiber.  -Stressed the importance of regular exercise.   -Substance Abuse: Discussed cessation/primary prevention of tobacco, alcohol, or other drug use; driving or other dangerous activities under the influence; availability of treatment for abuse.   -Injury prevention: Discussed safety belts, safety helmets, smoke detector, smoking near bedding or upholstery.   -Sexuality: Discussed sexually transmitted diseases, partner selection, use of condoms, avoidance of unintended pregnancy and contraceptive alternatives.   -Dental health: Discussed importance of regular tooth brushing, flossing, and dental visits.   -Health maintenance and immunizations reviewed. Please refer to Health maintenance section.  Return to care in 1 year for next preventative visit.   Algis Greenhouse. Jerline Pain, MD 08/15/2018 3:09 PM

## 2018-08-15 NOTE — Patient Instructions (Signed)
It was very nice to see you today!  Keep up the good work!  We will check your legs to make sure you are getting good blood flow.   If your blood flow is normal, we will get you in to see our physical therapists.   Come back to see me in 1 year, or sooner as needed.  Take care, Dr Jerline Pain   Preventive Care 25 Years and Older, Male Preventive care refers to lifestyle choices and visits with your health care provider that can promote health and wellness. What does preventive care include?  A yearly physical exam. This is also called an annual well check.  Dental exams once or twice a year.  Routine eye exams. Ask your health care provider how often you should have your eyes checked.  Personal lifestyle choices, including: ? Daily care of your teeth and gums. ? Regular physical activity. ? Eating a healthy diet. ? Avoiding tobacco and drug use. ? Limiting alcohol use. ? Practicing safe sex. ? Taking low doses of aspirin every day. ? Taking vitamin and mineral supplements as recommended by your health care provider. What happens during an annual well check? The services and screenings done by your health care provider during your annual well check will depend on your age, overall health, lifestyle risk factors, and family history of disease. Counseling Your health care provider may ask you questions about your:  Alcohol use.  Tobacco use.  Drug use.  Emotional well-being.  Home and relationship well-being.  Sexual activity.  Eating habits.  History of falls.  Memory and ability to understand (cognition).  Work and work Statistician.  Screening You may have the following tests or measurements:  Height, weight, and BMI.  Blood pressure.  Lipid and cholesterol levels. These may be checked every 5 years, or more frequently if you are over 72 years old.  Skin check.  Lung cancer screening. You may have this screening every year starting at age 46 if you have  a 30-pack-year history of smoking and currently smoke or have quit within the past 15 years.  Fecal occult blood test (FOBT) of the stool. You may have this test every year starting at age 11.  Flexible sigmoidoscopy or colonoscopy. You may have a sigmoidoscopy every 5 years or a colonoscopy every 10 years starting at age 50.  Prostate cancer screening. Recommendations will vary depending on your family history and other risks.  Hepatitis C blood test.  Hepatitis B blood test.  Sexually transmitted disease (STD) testing.  Diabetes screening. This is done by checking your blood sugar (glucose) after you have not eaten for a while (fasting). You may have this done every 1-3 years.  Abdominal aortic aneurysm (AAA) screening. You may need this if you are a current or former smoker.  Osteoporosis. You may be screened starting at age 19 if you are at high risk.  Talk with your health care provider about your test results, treatment options, and if necessary, the need for more tests. Vaccines Your health care provider may recommend certain vaccines, such as:  Influenza vaccine. This is recommended every year.  Tetanus, diphtheria, and acellular pertussis (Tdap, Td) vaccine. You may need a Td booster every 10 years.  Varicella vaccine. You may need this if you have not been vaccinated.  Zoster vaccine. You may need this after age 41.  Measles, mumps, and rubella (MMR) vaccine. You may need at least one dose of MMR if you were born in 1957 or  later. You may also need a second dose.  Pneumococcal 13-valent conjugate (PCV13) vaccine. One dose is recommended after age 78.  Pneumococcal polysaccharide (PPSV23) vaccine. One dose is recommended after age 39.  Meningococcal vaccine. You may need this if you have certain conditions.  Hepatitis A vaccine. You may need this if you have certain conditions or if you travel or work in places where you may be exposed to hepatitis A.  Hepatitis B  vaccine. You may need this if you have certain conditions or if you travel or work in places where you may be exposed to hepatitis B.  Haemophilus influenzae type b (Hib) vaccine. You may need this if you have certain risk factors.  Talk to your health care provider about which screenings and vaccines you need and how often you need them. This information is not intended to replace advice given to you by your health care provider. Make sure you discuss any questions you have with your health care provider. Document Released: 12/12/2015 Document Revised: 08/04/2016 Document Reviewed: 09/16/2015 Elsevier Interactive Patient Education  [employer].

## 2018-08-15 NOTE — Assessment & Plan Note (Signed)
Stable on Flomax 0.4 mg daily. 

## 2018-08-15 NOTE — Assessment & Plan Note (Signed)
Stable.  Follows up with dermatology every 6 months.

## 2018-08-16 ENCOUNTER — Encounter (HOSPITAL_COMMUNITY): Payer: Medicare HMO

## 2018-08-16 ENCOUNTER — Encounter (HOSPITAL_COMMUNITY)
Admission: RE | Admit: 2018-08-16 | Discharge: 2018-08-16 | Disposition: A | Discharge status: Home / Self Care | Payer: Medicare HMO | Source: Ambulatory Visit | Attending: Cardiology | Admitting: Cardiology

## 2018-08-16 DIAGNOSIS — Z87891 Personal history of nicotine dependence: Secondary | ICD-10-CM | POA: Diagnosis not present

## 2018-08-16 DIAGNOSIS — E785 Hyperlipidemia, unspecified: Secondary | ICD-10-CM | POA: Diagnosis not present

## 2018-08-16 DIAGNOSIS — Z951 Presence of aortocoronary bypass graft: Secondary | ICD-10-CM

## 2018-08-16 DIAGNOSIS — I10 Essential (primary) hypertension: Secondary | ICD-10-CM | POA: Diagnosis not present

## 2018-08-16 DIAGNOSIS — I2510 Atherosclerotic heart disease of native coronary artery without angina pectoris: Secondary | ICD-10-CM | POA: Diagnosis not present

## 2018-08-18 ENCOUNTER — Ambulatory Visit: Payer: Medicare HMO | Admitting: Cardiology

## 2018-08-18 ENCOUNTER — Encounter: Payer: Self-pay | Admitting: Cardiology

## 2018-08-18 ENCOUNTER — Encounter (HOSPITAL_COMMUNITY)
Admission: RE | Admit: 2018-08-18 | Discharge: 2018-08-18 | Disposition: A | Discharge status: Home / Self Care | Payer: Medicare HMO | Source: Ambulatory Visit | Attending: Cardiology | Admitting: Cardiology

## 2018-08-18 ENCOUNTER — Encounter (HOSPITAL_COMMUNITY): Payer: Medicare HMO

## 2018-08-18 VITALS — BP 106/68 | HR 57 | Ht 68.0 in | Wt 195.1 lb

## 2018-08-18 DIAGNOSIS — E785 Hyperlipidemia, unspecified: Secondary | ICD-10-CM | POA: Diagnosis not present

## 2018-08-18 DIAGNOSIS — Z789 Other specified health status: Secondary | ICD-10-CM

## 2018-08-18 DIAGNOSIS — Z951 Presence of aortocoronary bypass graft: Secondary | ICD-10-CM | POA: Diagnosis not present

## 2018-08-18 DIAGNOSIS — I2510 Atherosclerotic heart disease of native coronary artery without angina pectoris: Secondary | ICD-10-CM | POA: Diagnosis not present

## 2018-08-18 DIAGNOSIS — Z87891 Personal history of nicotine dependence: Secondary | ICD-10-CM | POA: Diagnosis not present

## 2018-08-18 DIAGNOSIS — I25119 Atherosclerotic heart disease of native coronary artery with unspecified angina pectoris: Secondary | ICD-10-CM

## 2018-08-18 DIAGNOSIS — I10 Essential (primary) hypertension: Secondary | ICD-10-CM

## 2018-08-18 MED ORDER — ASPIRIN EC 81 MG PO TBEC: 81.0000 mg | DELAYED_RELEASE_TABLET | Freq: Every day | ORAL | Status: AC

## 2018-08-18 NOTE — Progress Notes (Signed)
Cardiology Office Note:    Date:  08/18/2018   [name], DOB May 27, 1946, MRN [id number]  PCP:  Vivi Barrack, MD  Cardiologist:  Shirlee More, MD    Referring MD: Marletta Lor, MD    ASSESSMENT:    1. Coronary artery disease involving native coronary artery of native heart with angina pectoris ([place])   2. Essential hypertension   3. Dyslipidemia    PLAN:    In order of problems listed above:  1. Stable continue medical therapy is having no anginal discomfort I will plan to see back in my office 1 year 2. Stable blood pressure target this time does not require antihypertensives 3. Worsened intolerant of high intensity statin will withdraw for 2 to 3 weeks and if muscle symptoms resolved resume his simvastatin previously well tolerated check lipids a month later and have additional therapy as needed and Zetia.  Check CPK although I doubt he has clinical rhabdomyolysis   Next appointment: One year   Medication Adjustments/Labs and Tests Ordered: Current medicines are reviewed at length with the patient today.  Concerns regarding medicines are outlined above.  No orders of the defined types were placed in this encounter.  No orders of the defined types were placed in this encounter.   Chief Complaint  Patient presents with  . Coronary Artery Disease    History of Present Illness:    Vincent Stevens is a 72 y.o. male with a hx of CAD and CABG, hyperlipidemia 03/20/18 last seen 03/20/18. He is having muscle aching with his high intensity statin. Compliance with diet, lifestyle and medications: Yes  He is quite bothered by muscle aching in the lower extremities but despite this continues to exercise and otherwise is pleased with the quality of his life no chest pain dyspnea palpitation or syncope.  He wonders if his high intensity statin is the problem.  He brought records for exercise tolerance heart rate blood pressure arrhythmia to show slow steady  improvement and no difficulty from a cardiac rehab program that are reviewed with the patient during visit Past Medical History:  Diagnosis Date  . Allergy   . Benign prostatic hypertrophy   . BENIGN PROSTATIC HYPERTROPHY 09/19/2009   Qualifier: Diagnosis of  By: Burnice Logan  MD, Doretha Sou   . Chest pain 02/23/2018  . Dyslipidemia 08/14/2010   Qualifier: Diagnosis of  By: Burnice Logan  MD, Doretha Sou   . Essential hypertension 01/17/2009   Qualifier: Diagnosis of  By: Burnice Logan  MD, Doretha Sou   . Gout   . GOUT, ACUTE 11/07/2009   Qualifier: Diagnosis of  By: Burnice Logan  MD, Doretha Sou   . History of colonic polyps 09/27/2008   Qualifier: Diagnosis of  By: Burnice Logan  MD, Doretha Sou  Initial colonoscopy 2003.  Hyperplastic polyps only Followup colonoscopy 2008.  Normal  10 year interval recommended   . Hyperlipidemia   . Hypertension   . Impaired glucose tolerance 02/20/2013  . Left main coronary artery disease   . Melanoma ([place])   . SOB (shortness of breath) 02/23/2018    Past Surgical History:  Procedure Laterality Date  . CORONARY ARTERY BYPASS GRAFT N/A 03/01/2018   Procedure: CORONARY ARTERY BYPASS GRAFTING (CABG) times three using the right saphaneous vein. Harvested endoscopicly; and left internal mammary artery.;  Surgeon: Gaye Pollack, MD;  Location: [facility];  Service: Open Heart Surgery;  Laterality: N/A;  . HERNIA REPAIR    . LEFT HEART CATH AND  CORONARY ANGIOGRAPHY N/A 03/01/2018   Procedure: LEFT HEART CATH AND CORONARY ANGIOGRAPHY;  Surgeon: Leonie Man, MD;  Location: [facility];  Service: Cardiovascular;  Laterality: N/A;  . melanoma removal    . TEE WITHOUT CARDIOVERSION N/A 03/01/2018   Procedure: TRANSESOPHAGEAL ECHOCARDIOGRAM (TEE);  Surgeon: Gaye Pollack, MD;  Location: [facility];  Service: Open Heart Surgery;  Laterality: N/A;    Current Medications: Current Meds  Medication Sig  . aspirin EC 325 MG EC tablet Take 1 tablet (325 mg total) by mouth daily.  Marland Kitchen  ibuprofen (ADVIL,MOTRIN) 200 MG tablet Take 200 mg by mouth 2 (two) times daily as needed for mild pain or cramping.  . Multiple Vitamin (MULTIVITAMIN) tablet Take 1 tablet by mouth daily.  . tamsulosin (FLOMAX) 0.4 MG CAPS capsule TAKE 1 CAPSULE (0.4 MG TOTAL) BY MOUTH DAILY.  . [DISCONTINUED] rosuvastatin (CRESTOR) 20 MG tablet Take 1 tablet (20 mg total) by mouth daily.     Allergies:   Sulfamethoxazole   Social History   Socioeconomic History  . Marital status: Married    Spouse name: Not on file  . Number of children: 1  . Years of education: Not on file  . Highest education level: Not on file  Occupational History  . Not on file  Social Needs  . Financial resource strain: Not on file  . Food insecurity:    Worry: Not on file    Inability: Not on file  . Transportation needs:    Medical: Not on file    Non-medical: Not on file  Tobacco Use  . Smoking status: Former Smoker    Last attempt to quit: 11/29/1980    Years since quitting: 37.7  . Smokeless tobacco: Never Used  Substance and Sexual Activity  . Alcohol use: No  . Drug use: No  . Sexual activity: Not on file  Lifestyle  . Physical activity:    Days per week: Not on file    Minutes per session: Not on file  . Stress: Not on file  Relationships  . Social connections:    Talks on phone: Not on file    Gets together: Not on file    Attends religious service: Not on file    Active member of club or organization: Not on file    Attends meetings of clubs or organizations: Not on file    Relationship status: Not on file  Other Topics Concern  . Not on file  Social History Narrative  . Not on file     Family History: The patient's family history includes Cancer in his sister; Dementia in his mother; Diabetes in his unknown relative; Heart attack in his father; Hypertension in his unknown relative. ROS:   Please see the history of present illness.    All other systems reviewed and are  negative.  EKGs/Labs/Other Studies Reviewed:    The following studies were reviewed today:  EKG:  EKG ordered today.  The ekg ordered today demonstrates shows sinus rhythm 54 bpm normal  Recent Labs: 03/02/2018: Magnesium 2.6 03/20/2018: Hemoglobin 13.9; Platelets 544 04/19/2018: BUN 14; Creatinine, Ser 0.94; Potassium 4.8; Sodium 141  Recent Lipid Panel    Component Value Date/Time   CHOL 134 04/19/2018 0810   TRIG 105 04/19/2018 0810   HDL 43 04/19/2018 0810   CHOLHDL 3.1 04/19/2018 0810   CHOLHDL 3 06/24/2017 1226   VLDL 20.8 06/24/2017 1226   LDLCALC 70 04/19/2018 0810    Physical Exam:  VS:  BP 106/68 (BP Location: Right Arm, Patient Position: Sitting, Cuff Size: Normal)   Pulse (!) 57   Ht 5\' 8"  (1.727 m)   Wt 195 lb 1.9 oz (88.5 kg)   SpO2 97%   BMI 29.67 kg/m     Wt Readings from Last 3 Encounters:  08/18/18 195 lb 1.9 oz (88.5 kg)  08/15/18 197 lb 3.2 oz (89.4 kg)  06/26/18 197 lb 12 oz (89.7 kg)     GEN:  Well nourished, well developed in no acute distress HEENT: Normal NECK: No JVD; No carotid bruits LYMPHATICS: No lymphadenopathy CARDIAC: RRR, no murmurs, rubs, gallops RESPIRATORY:  Clear to auscultation without rales, wheezing or rhonchi  ABDOMEN: Soft, non-tender, non-distended MUSCULOSKELETAL:  No edema; No deformity  SKIN: Warm and dry NEUROLOGIC:  Alert and oriented x 3 PSYCHIATRIC:  Normal affect    Signed, Shirlee More, MD  08/18/2018 8:54 AM    [place]

## 2018-08-18 NOTE — Patient Instructions (Addendum)
Medication Instructions:  Your physician has recommended you make the following change in your medication:   STOP rosuvastatin (crestor) **Call our office in 2-3 weeks to let us know if your symptoms have improved. If they have we will restart simvastatin (lipitor) 20 mg daily.   DECREASE aspirin 81 mg daily  Labwork: Your physician recommends that you return for lab work today: CPK.   Testing/Procedures: You had an EKG today.   Follow-Up: Your physician wants you to follow-up in: 1 year. You will receive a reminder letter in the mail two months in advance. If you don't receive a letter, please call our office to schedule the follow-up appointment.   If you need a refill on your cardiac medications before your next appointment, please call your pharmacy.   Thank you for choosing [facility]! Robyne Peers, RN [phone]   Creatine Kinase Test Why am I having this test? The creatine kinase (CK) test is performed to determine if there has been damage to muscle tissue in your body. This test can be used to help diagnose heart attack, neurologic diseases, or skeletal diseases. Three different forms of CK are present in your body. They are referred to as isoenzymes:  CK-MM is found in your skeletal muscles and heart.  CK-MB is found mostly in your heart.  CK-BB is found mostly in your brain.  What kind of sample is taken? A blood sample is required for this test. It is usually collected by inserting a needle into a vein. How do I prepare for this test? There is no preparation required for this test. Be aware that your health care provider may require blood samples to be taken at regular intervals for up to 1 week. What are the reference ranges? Reference ranges are considered healthy ranges established after testing a large group of healthy people. Reference ranges may vary among different people, labs, and hospitals. It is your responsibility to obtain your test  results. Ask the lab or department performing the test when and how you will get your results. The reference ranges for the CK test are as follows: Total CK:  Adult or elderly (values are higher after exercise): ? Male: 55-170 units/L or 55-170 units/L (SI units). ? Male: 30-135 units/L or 30-135 units/L (SI units).  Newborn: 68-580 units/L (SI units). Isoenzymes:  CK-MM: 100%.  CK-MB: 0%.  CK-BB: 0%. What do the results mean? Levels of total CK that are above the reference ranges may indicate injury or diseases affecting the heart, skeletal muscle, or brain. Increased levels of CK-MM isoenzyme may indicate:  Certain diseases affecting the skeletal muscle.  Recent surgery, trauma, or injury.  Conditions that cause convulsions.  Increased levels of CK-MB isoenzyme may indicate:  Recent heart attack.  Other conditions that cause injury to the heart muscle.  Increased levels of CK-BB isoenzyme may indicate:  Diseases that affect the central nervous system.  Certain psychiatric therapies.  Certain types of cancer.  Injury to the lungs.  Talk with your health care provider to discuss your results, treatment options, and if necessary, the need for more tests. Talk with your health care provider if you have any questions about your results. Talk with your health care provider to discuss your results, treatment options, and if necessary, the need for more tests. Talk with your health care provider if you have any questions about your results. This information is not intended to replace advice given to you by your health care provider. Make sure you discuss  any questions you have with your health care provider. Document Released: 12/16/2004 Document Revised: 07/20/2016 Document Reviewed: 04/11/2014 Elsevier Interactive Patient Education  [employer].

## 2018-08-19 LAB — CK: Total CK: 84 U/L (ref 24–204)

## 2018-08-21 ENCOUNTER — Encounter (HOSPITAL_COMMUNITY): Payer: Medicare HMO

## 2018-08-21 ENCOUNTER — Encounter (HOSPITAL_COMMUNITY)
Admission: RE | Admit: 2018-08-21 | Discharge: 2018-08-21 | Disposition: A | Discharge status: Home / Self Care | Payer: Medicare HMO | Source: Ambulatory Visit | Attending: Cardiology | Admitting: Cardiology

## 2018-08-21 DIAGNOSIS — Z87891 Personal history of nicotine dependence: Secondary | ICD-10-CM | POA: Diagnosis not present

## 2018-08-21 DIAGNOSIS — I10 Essential (primary) hypertension: Secondary | ICD-10-CM | POA: Diagnosis not present

## 2018-08-21 DIAGNOSIS — I2510 Atherosclerotic heart disease of native coronary artery without angina pectoris: Secondary | ICD-10-CM | POA: Diagnosis not present

## 2018-08-21 DIAGNOSIS — Z951 Presence of aortocoronary bypass graft: Secondary | ICD-10-CM

## 2018-08-21 DIAGNOSIS — E785 Hyperlipidemia, unspecified: Secondary | ICD-10-CM | POA: Diagnosis not present

## 2018-08-22 ENCOUNTER — Ambulatory Visit (HOSPITAL_COMMUNITY): Admission: RE | Admit: 2018-08-22 | Payer: Medicare HMO | Source: Ambulatory Visit

## 2018-08-23 ENCOUNTER — Encounter (HOSPITAL_COMMUNITY): Payer: Medicare HMO

## 2018-08-23 ENCOUNTER — Encounter (HOSPITAL_COMMUNITY)
Admission: RE | Admit: 2018-08-23 | Discharge: 2018-08-23 | Disposition: A | Discharge status: Home / Self Care | Payer: Medicare HMO | Source: Ambulatory Visit | Attending: Cardiology | Admitting: Cardiology

## 2018-08-23 DIAGNOSIS — Z951 Presence of aortocoronary bypass graft: Secondary | ICD-10-CM

## 2018-08-23 DIAGNOSIS — Z87891 Personal history of nicotine dependence: Secondary | ICD-10-CM | POA: Diagnosis not present

## 2018-08-23 DIAGNOSIS — I2510 Atherosclerotic heart disease of native coronary artery without angina pectoris: Secondary | ICD-10-CM | POA: Diagnosis not present

## 2018-08-23 DIAGNOSIS — E785 Hyperlipidemia, unspecified: Secondary | ICD-10-CM | POA: Diagnosis not present

## 2018-08-23 DIAGNOSIS — I10 Essential (primary) hypertension: Secondary | ICD-10-CM | POA: Diagnosis not present

## 2018-08-25 ENCOUNTER — Encounter (HOSPITAL_COMMUNITY): Payer: Medicare HMO

## 2018-08-25 ENCOUNTER — Encounter (HOSPITAL_COMMUNITY)
Admission: RE | Admit: 2018-08-25 | Discharge: 2018-08-25 | Disposition: A | Discharge status: Home / Self Care | Payer: Medicare HMO | Source: Ambulatory Visit | Attending: Cardiology | Admitting: Cardiology

## 2018-08-25 DIAGNOSIS — D1801 Hemangioma of skin and subcutaneous tissue: Secondary | ICD-10-CM | POA: Diagnosis not present

## 2018-08-25 DIAGNOSIS — I2510 Atherosclerotic heart disease of native coronary artery without angina pectoris: Secondary | ICD-10-CM | POA: Diagnosis not present

## 2018-08-25 DIAGNOSIS — Z951 Presence of aortocoronary bypass graft: Secondary | ICD-10-CM

## 2018-08-25 DIAGNOSIS — Z87891 Personal history of nicotine dependence: Secondary | ICD-10-CM | POA: Diagnosis not present

## 2018-08-25 DIAGNOSIS — L821 Other seborrheic keratosis: Secondary | ICD-10-CM | POA: Diagnosis not present

## 2018-08-25 DIAGNOSIS — L814 Other melanin hyperpigmentation: Secondary | ICD-10-CM | POA: Diagnosis not present

## 2018-08-25 DIAGNOSIS — E785 Hyperlipidemia, unspecified: Secondary | ICD-10-CM | POA: Diagnosis not present

## 2018-08-25 DIAGNOSIS — Z85820 Personal history of malignant melanoma of skin: Secondary | ICD-10-CM | POA: Diagnosis not present

## 2018-08-25 DIAGNOSIS — D229 Melanocytic nevi, unspecified: Secondary | ICD-10-CM | POA: Diagnosis not present

## 2018-08-25 DIAGNOSIS — L570 Actinic keratosis: Secondary | ICD-10-CM | POA: Diagnosis not present

## 2018-08-25 DIAGNOSIS — I10 Essential (primary) hypertension: Secondary | ICD-10-CM | POA: Diagnosis not present

## 2018-08-25 DIAGNOSIS — L819 Disorder of pigmentation, unspecified: Secondary | ICD-10-CM | POA: Diagnosis not present

## 2018-08-28 ENCOUNTER — Encounter (HOSPITAL_COMMUNITY)
Admission: RE | Admit: 2018-08-28 | Discharge: 2018-08-28 | Disposition: A | Discharge status: Home / Self Care | Payer: Medicare HMO | Source: Ambulatory Visit | Attending: Cardiology | Admitting: Cardiology

## 2018-08-28 ENCOUNTER — Encounter (HOSPITAL_COMMUNITY): Payer: Medicare HMO

## 2018-08-28 DIAGNOSIS — Z951 Presence of aortocoronary bypass graft: Secondary | ICD-10-CM | POA: Diagnosis not present

## 2018-08-28 DIAGNOSIS — I10 Essential (primary) hypertension: Secondary | ICD-10-CM | POA: Diagnosis not present

## 2018-08-28 DIAGNOSIS — Z87891 Personal history of nicotine dependence: Secondary | ICD-10-CM | POA: Diagnosis not present

## 2018-08-28 DIAGNOSIS — E785 Hyperlipidemia, unspecified: Secondary | ICD-10-CM | POA: Diagnosis not present

## 2018-08-28 DIAGNOSIS — I2510 Atherosclerotic heart disease of native coronary artery without angina pectoris: Secondary | ICD-10-CM | POA: Diagnosis not present

## 2018-08-30 ENCOUNTER — Encounter (HOSPITAL_COMMUNITY): Payer: Medicare HMO

## 2018-08-30 ENCOUNTER — Encounter (HOSPITAL_COMMUNITY): Payer: Self-pay

## 2018-08-30 ENCOUNTER — Encounter (HOSPITAL_COMMUNITY)
Admission: RE | Admit: 2018-08-30 | Discharge: 2018-08-30 | Disposition: A | Discharge status: Home / Self Care | Payer: Medicare HMO | Source: Ambulatory Visit | Attending: Cardiology | Admitting: Cardiology

## 2018-08-30 DIAGNOSIS — I10 Essential (primary) hypertension: Secondary | ICD-10-CM | POA: Diagnosis not present

## 2018-08-30 DIAGNOSIS — E785 Hyperlipidemia, unspecified: Secondary | ICD-10-CM | POA: Diagnosis not present

## 2018-08-30 DIAGNOSIS — Z951 Presence of aortocoronary bypass graft: Secondary | ICD-10-CM | POA: Insufficient documentation

## 2018-08-30 DIAGNOSIS — I2510 Atherosclerotic heart disease of native coronary artery without angina pectoris: Secondary | ICD-10-CM | POA: Diagnosis not present

## 2018-08-30 DIAGNOSIS — Z87891 Personal history of nicotine dependence: Secondary | ICD-10-CM | POA: Insufficient documentation

## 2018-08-31 NOTE — Progress Notes (Signed)
Cardiac Individual Treatment Plan  Patient Details  Name: Vincent Stevens MRN: [id number] Date of Birth: Oct 12, 1946 Referring Provider:     [name] from 06/15/2018 in [facility]  Referring Provider  [name]       Initial Encounter Date:    CARDIAC REHAB PHASE II ORIENTATION from 06/15/2018 in [facility]  Date  06/15/18      Visit Diagnosis: 03/11/18 S/P CABG x 3  Patient's Home Medications on Admission:  Current Outpatient Medications:  .  aspirin EC 81 MG tablet, Take 1 tablet (81 mg total) by mouth daily., Disp: , Rfl:  .  ibuprofen (ADVIL,MOTRIN) 200 MG tablet, Take 200 mg by mouth 2 (two) times daily as needed for mild pain or cramping., Disp: , Rfl:  .  Multiple Vitamin (MULTIVITAMIN) tablet, Take 1 tablet by mouth daily., Disp: 90 tablet, Rfl: 3 .  tamsulosin (FLOMAX) 0.4 MG CAPS capsule, TAKE 1 CAPSULE (0.4 MG TOTAL) BY MOUTH DAILY., Disp: 90 capsule, Rfl: 1  Past Medical History: Past Medical History:  Diagnosis Date  . Allergy   . Benign prostatic hypertrophy   . BENIGN PROSTATIC HYPERTROPHY 09/19/2009   Qualifier: Diagnosis of  By: Burnice Logan  MD, Doretha Sou   . Chest pain 02/23/2018  . Dyslipidemia 08/14/2010   Qualifier: Diagnosis of  By: Burnice Logan  MD, Doretha Sou   . Essential hypertension 01/17/2009   Qualifier: Diagnosis of  By: Burnice Logan  MD, Doretha Sou   . Gout   . GOUT, ACUTE 11/07/2009   Qualifier: Diagnosis of  By: Burnice Logan  MD, Doretha Sou   . History of colonic polyps 09/27/2008   Qualifier: Diagnosis of  By: Burnice Logan  MD, Doretha Sou  Initial colonoscopy 2003.  Hyperplastic polyps only Followup colonoscopy 2008.  Normal  10 year interval recommended   . Hyperlipidemia   . Hypertension   . Impaired glucose tolerance 02/20/2013  . Left main coronary artery disease   . Melanoma ([place])   . SOB (shortness of breath) 02/23/2018    Tobacco Use: Social History    Tobacco Use  Smoking Status Former Smoker  . Last attempt to quit: 11/29/1980  . Years since quitting: 37.7  Smokeless Tobacco Never Used    Labs: Recent Review Flowsheet Data    Labs for ITP Cardiac and Pulmonary Rehab Latest Ref Rng & Units 03/02/2018 03/02/2018 03/02/2018 03/20/2018 04/19/2018   Cholestrol 100 - 199 mg/dL - - - 144 134   LDLCALC 0 - 99 mg/dL - - - 78 70   HDL >39 mg/dL - - - 40 43   Trlycerides 0 - 149 mg/dL - - - 129 105   Hemoglobin A1c 4.6 - 6.5 % - - - - -   PHART 7.350 - 7.450 7.336(L) 7.336(L) - - -   PCO2ART 32.0 - 48.0 mmHg 39.4 40.5 - - -   HCO3 20.0 - 28.0 mmol/L 21.0 21.6 - - -   TCO2 22 - 32 mmol/L 22 23 23  - -   ACIDBASEDEF 0.0 - 2.0 mmol/L 4.0(H) 4.0(H) - - -   O2SAT % 99.0 98.0 - - -      Capillary Blood Glucose: Lab Results  Component Value Date   GLUCAP 99 03/05/2018   GLUCAP 113 (H) 03/05/2018   GLUCAP 104 (H) 03/04/2018   GLUCAP 106 (H) 03/04/2018   GLUCAP 110 (H) 03/04/2018     Exercise Target Goals: Exercise Program  Goal: Individual exercise prescription set using results from initial 6 min walk test and THRR while considering  patient's activity barriers and safety.   Exercise Prescription Goal: Initial exercise prescription builds to 30-45 minutes a day of aerobic activity, 2-3 days per week.  Home exercise guidelines will be given to patient during program as part of exercise prescription that the participant will acknowledge.  Activity Barriers & Risk Stratification: Activity Barriers & Cardiac Risk Stratification - 06/15/18 0938      Activity Barriers & Cardiac Risk Stratification   Comments  B knee discomfort, L shoulder pain/limitations       6 Minute Walk: 6 Minute Walk    Row Name 06/15/18 0927         6 Minute Walk   Phase  Initial     Distance  1725 feet     Walk Time  6 minutes     # of Rest Breaks  0     MPH  3.26     METS  3.36     RPE  12     Perceived Dyspnea   0     VO2 Peak  11.77     Symptoms  Yes  (comment)     Comments  Left Knee Pain +6     Resting HR  71 bpm     Resting BP  102/70     Resting Oxygen Saturation   97 %     Exercise Oxygen Saturation  during 6 min walk  96 %     Max Ex. HR  109 bpm     Max Ex. BP  122/80     2 Minute Post BP  122/70        Oxygen Initial Assessment:   Oxygen Re-Evaluation:   Oxygen Discharge (Final Oxygen Re-Evaluation):   Initial Exercise Prescription: Initial Exercise Prescription - 06/15/18 0900      Date of Initial Exercise RX and Referring Provider   Date  06/15/18    Referring Provider  [name]     Expected Discharge Date  09/22/18      Bike   Level  0.8    Minutes  10    METs  2.68      NuStep   Level  2    SPM  75    Minutes  10    METs  2.5      Track   Laps  13    Minutes  10    METs  3.3      Prescription Details   Frequency (times per week)  3x    Duration  Progress to 30 minutes of continuous aerobic without signs/symptoms of physical distress      Intensity   THRR 40-80% of Max Heartrate  59-118    Ratings of Perceived Exertion  11-13    Perceived Dyspnea  0-4      Progression   Progression  Continue progressive overload as per policy without signs/symptoms or physical distress.      Resistance Training   Training Prescription  Yes    Weight  3lbs    Reps  10-15       Perform Capillary Blood Glucose checks as needed.  Exercise Prescription Changes: Exercise Prescription Changes    Row Name 06/21/18 1000 07/03/18 1455 07/19/18 1544 07/28/18 1725 08/16/18 1400     Response to Exercise   Blood Pressure (Admit)  136/70  126/80  112/70  102/60  122/70   Blood Pressure (Exercise)  120/78  126/70  110/62  120/70  140/80   Blood Pressure (Exit)  118/70  120/72  102/60  112/76  118/64   Heart Rate (Admit)  73 bpm  82 bpm  84 bpm  65 bpm  80 bpm   Heart Rate (Exercise)  105 bpm  106 bpm  102 bpm  100 bpm  101 bpm   Heart Rate (Exit)  68 bpm  82 bpm  74 bpm  76 bpm  80 bpm   Rating of  Perceived Exertion (Exercise)  13  13  11  12  12    Perceived Dyspnea (Exercise)  0  0  0  0  0   Symptoms  None   None   None  None  None   Comments  Pt oriented to exercise equipment   -  None   -  None   Duration  Progress to 30 minutes of  aerobic without signs/symptoms of physical distress  Continue with 30 min of aerobic exercise without signs/symptoms of physical distress.  Continue with 30 min of aerobic exercise without signs/symptoms of physical distress.  Continue with 30 min of aerobic exercise without signs/symptoms of physical distress.  Continue with 30 min of aerobic exercise without signs/symptoms of physical distress.   Intensity  THRR New  THRR unchanged  THRR unchanged  THRR unchanged  THRR unchanged     Progression   Progression  Continue to progress workloads to maintain intensity without signs/symptoms of physical distress.  Continue to progress workloads to maintain intensity without signs/symptoms of physical distress.  Continue to progress workloads to maintain intensity without signs/symptoms of physical distress.  Continue to progress workloads to maintain intensity without signs/symptoms of physical distress.  Continue to progress workloads to maintain intensity without signs/symptoms of physical distress.   Average METs  2.8  3.1  3.13  3.29  3.51     Resistance Training   Training Prescription  No  Yes  No  Yes  No   Weight  -  3lbs  -  3lbs  -   Reps  -  10-15  -  10-15  -   Time  -  10 Minutes  -  10 Minutes  -     Interval Training   Interval Training  No  No  No  No  No     Bike   Level  0.8  0.8  0.8  0.8  1.2   Minutes  10  10  10  10  10    METs  2.69  2.69  2.69  2.69  3.56     NuStep   Level  2  3  4  4  5    SPM  85  95  95  95  95   Minutes  10  10  10  10  10    METs  2.1  2.9  3.1  3.4  3.2     Track   Laps  15  16  15  15  16    Minutes  10  10  10  10  10    METs  3.61  3.78  3.61  3.61  3.78     Home Exercise Plan   Plans to continue  exercise at  -  -  Home (comment) Walking  Home (comment) Walking  Home (comment) Walking   Frequency  -  -  Add 2 additional days  to program exercise sessions.  Add 2 additional days to program exercise sessions.  Add 2 additional days to program exercise sessions.   Initial Home Exercises Provided  -  -  07/12/18  07/12/18  07/12/18   Row Name 08/23/18 1600 08/30/18 1700           Response to Exercise   Blood Pressure (Admit)  118/74  140/78      Blood Pressure (Exercise)  -  132/68      Blood Pressure (Exit)  112/64  120/78      Heart Rate (Admit)  79 bpm  71 bpm      Heart Rate (Exercise)  108 bpm  97 bpm      Heart Rate (Exit)  79 bpm  71 bpm      Rating of Perceived Exertion (Exercise)  12  12      Perceived Dyspnea (Exercise)  0  0      Symptoms  None  None      Comments  Non  None      Duration  Continue with 30 min of aerobic exercise without signs/symptoms of physical distress.  Continue with 30 min of aerobic exercise without signs/symptoms of physical distress.      Intensity  THRR unchanged  THRR unchanged        Progression   Progression  Continue to progress workloads to maintain intensity without signs/symptoms of physical distress.  Continue to progress workloads to maintain intensity without signs/symptoms of physical distress.      Average METs  3.8  3.8        Resistance Training   Training Prescription  No  No        Interval Training   Interval Training  No  -        Bike   Level  1.2  1.2      Minutes  10  10      METs  3.53  3.53        NuStep   Level  5  5      SPM  100  100      Minutes  10  10      METs  4.2  4.1        Track   Laps  16  16      Minutes  10  10      METs  3.78  3.78        Home Exercise Plan   Plans to continue exercise at  Home (comment) Walking  Home (comment) Walking      Frequency  Add 2 additional days to program exercise sessions.  Add 2 additional days to program exercise sessions.      Initial Home Exercises  Provided  07/12/18  07/12/18         Exercise Comments: Exercise Comments    Row Name 06/21/18 0959 07/05/18 1456 07/19/18 1037 08/23/18 1642 08/23/18 1645   Exercise Comments  Pt's first day of exercise. Pt oriented to exercise equipment. Pt responded well to exercise prescription. Will continue to monitor and progress pt as tolerated.   Pt is continuing to respond well to exercise prescription. Will follow up with pt regarding home exercise plans. Pt states he is enjoying rehab and will stay in program longer than the 1 month he intended to. Will continue to monitor pt.   Reviewed HEP with pt. Pt is currently walking with family for exercise. Pt  will work to increase walking time to 30 mins. Will continue to monitor and progress pt as tolerated.   Reviewed METs and Goals with pt. Pt states he is feeling better since being in rehab.   Reviewed METs and Goals with pt. Pt states he is feeling better since being in rehab. Will continue to monitor and progress pt as tolerated.       Exercise Goals and Review: Exercise Goals    Row Name 06/15/18 0160             Exercise Goals   Increase Physical Activity  Yes       Intervention  Provide advice, education, support and counseling about physical activity/exercise needs.;Develop an individualized exercise prescription for aerobic and resistive training based on initial evaluation findings, risk stratification, comorbidities and participant's personal goals.       Expected Outcomes  Short Term: Attend rehab on a regular basis to increase amount of physical activity.;Long Term: Add in home exercise to make exercise part of routine and to increase amount of physical activity.;Long Term: Exercising regularly at least 3-5 days a week.       Increase Strength and Stamina  Yes increase muscle tone       Intervention  Provide advice, education, support and counseling about physical activity/exercise needs.;Develop an individualized exercise prescription for  aerobic and resistive training based on initial evaluation findings, risk stratification, comorbidities and participant's personal goals.       Expected Outcomes  Short Term: Increase workloads from initial exercise prescription for resistance, speed, and METs.;Short Term: Perform resistance training exercises routinely during rehab and add in resistance training at home;Long Term: Improve cardiorespiratory fitness, muscular endurance and strength as measured by increased METs and functional capacity (6MWT)       Able to understand and use rate of perceived exertion (RPE) scale  Yes       Intervention  Provide education and explanation on how to use RPE scale       Expected Outcomes  Short Term: Able to use RPE daily in rehab to express subjective intensity level;Long Term:  Able to use RPE to guide intensity level when exercising independently       Knowledge and understanding of Target Heart Rate Range (THRR)  Yes       Intervention  Provide education and explanation of THRR including how the numbers were predicted and where they are located for reference       Expected Outcomes  Short Term: Able to state/look up THRR;Long Term: Able to use THRR to govern intensity when exercising independently;Short Term: Able to use daily as guideline for intensity in rehab       Able to check pulse independently  Yes       Intervention  Provide education and demonstration on how to check pulse in carotid and radial arteries.;Review the importance of being able to check your own pulse for safety during independent exercise       Expected Outcomes  Short Term: Able to explain why pulse checking is important during independent exercise;Long Term: Able to check pulse independently and accurately          Exercise Goals Re-Evaluation : Exercise Goals Re-Evaluation    Row Name 07/19/18 1039 08/23/18 1645           Exercise Goal Re-Evaluation   Exercise Goals Review  Increase Physical Activity;Able to understand  and use rate of perceived exertion (RPE) scale;Knowledge and understanding of Target Heart Rate  Range (THRR);Understanding of Exercise Prescription;Increase Strength and Stamina;Able to check pulse independently;Improve claudication pain tolerance and improve walking ability  Increase Physical Activity;Understanding of Exercise Prescription      Comments  Reviewed HEP with pt. Also reviewed THRR, RPE Scale, weather precautions, endpoints of exercise, NTG use, warmup and cool down.   Pt is continuing to respond well to workloads. Pt states he is has been taken of his statin medication and that has help improve his walking. Pt is working at a level 5 on Nustep. Will continue to progress pt as tolerated.       Expected Outcomes  Pt will continue to walk 2-3 days a week for 20-25 minutes. Pt will continue to build stamina and strength. Will continue to monitor.   Pt will continue to walk 2-3 days a week., with wife and daughter. Pt will continue to work up to 30 minutes at a time. Will continue to monitor.          Discharge Exercise Prescription (Final Exercise Prescription Changes): Exercise Prescription Changes - 08/30/18 1700      Response to Exercise   Blood Pressure (Admit)  140/78    Blood Pressure (Exercise)  132/68    Blood Pressure (Exit)  120/78    Heart Rate (Admit)  71 bpm    Heart Rate (Exercise)  97 bpm    Heart Rate (Exit)  71 bpm    Rating of Perceived Exertion (Exercise)  12    Perceived Dyspnea (Exercise)  0    Symptoms  None    Comments  None    Duration  Continue with 30 min of aerobic exercise without signs/symptoms of physical distress.    Intensity  THRR unchanged      Progression   Progression  Continue to progress workloads to maintain intensity without signs/symptoms of physical distress.    Average METs  3.8      Resistance Training   Training Prescription  No      Bike   Level  1.2    Minutes  10    METs  3.53      NuStep   Level  5    SPM  100     Minutes  10    METs  4.1      Track   Laps  16    Minutes  10    METs  3.78      Home Exercise Plan   Plans to continue exercise at  Home (comment)   Walking   Frequency  Add 2 additional days to program exercise sessions.    Initial Home Exercises Provided  07/12/18       Nutrition:  Target Goals: Understanding of nutrition guidelines, daily intake of sodium 1500mg , cholesterol 200mg , calories 30% from fat and 7% or less from saturated fats, daily to have 5 or more servings of fruits and vegetables.  Biometrics: Pre Biometrics - 06/15/18 0928      Pre Biometrics   Height  5\' 8"  (1.727 m)    Weight  89.3 kg    Waist Circumference  42.5 inches    Hip Circumference  41.25 inches    Waist to Hip Ratio  1.03 %    BMI (Calculated)  29.94    Triceps Skinfold  26 mm    % Body Fat  32 %    Grip Strength  35 kg    Flexibility  0 in    Single Leg Stand  2.12  seconds        Nutrition Therapy Plan and Nutrition Goals: Nutrition Therapy & Goals - 06/15/18 1124      Nutrition Therapy   Diet  consistent carbohydrate heart healthy      Personal Nutrition Goals   Nutrition Goal  Pt to identify food quantities necessary to achieve weight loss of 6-24 lbs. at graduation from cardiac rehab. Goal wt of 10 lb desired.     Personal Goal #2  Pt to identify and limit food sources of saturated fat, trans fat, and sodium      Intervention Plan   Intervention  Prescribe, educate and counsel regarding individualized specific dietary modifications aiming towards targeted core components such as weight, hypertension, lipid management, diabetes, heart failure and other comorbidities.    Expected Outcomes  Short Term Goal: Understand basic principles of dietary content, such as calories, fat, sodium, cholesterol and nutrients.       Nutrition Assessments: Nutrition Assessments - 06/15/18 1115      MEDFICTS Scores   Pre Score  18       Nutrition Goals Re-Evaluation: Nutrition Goals  Re-Evaluation    [place] Name 06/15/18 1114 06/15/18 1124           Goals   Current Weight  196 lb 13.9 oz (89.3 kg)  196 lb 13.9 oz (89.3 kg)         Nutrition Goals Re-Evaluation: Nutrition Goals Re-Evaluation    [place] Name 06/15/18 1114 06/15/18 1124           Goals   Current Weight  196 lb 13.9 oz (89.3 kg)  196 lb 13.9 oz (89.3 kg)         Nutrition Goals Discharge (Final Nutrition Goals Re-Evaluation): Nutrition Goals Re-Evaluation - 06/15/18 1124      Goals   Current Weight  196 lb 13.9 oz (89.3 kg)       Psychosocial: Target Goals: Acknowledge presence or absence of significant depression and/or stress, maximize coping skills, provide positive support system. Participant is able to verbalize types and ability to use techniques and skills needed for reducing stress and depression.  Initial Review & Psychosocial Screening: Initial Psych Review & Screening - 06/15/18 0838      Initial Review   Current issues with  None Identified      Family Dynamics   Good Support System?  Yes   Aldon details his wife and daughter as sources of support.      Barriers   Psychosocial barriers to participate in program  There are no identifiable barriers or psychosocial needs.      Screening Interventions   Interventions  Encouraged to exercise       Quality of Life Scores: Quality of Life - 06/15/18 0836      Quality of Life   Select  Quality of Life      Quality of Life Scores   Health/Function Pre  26.4 %    Socioeconomic Pre  29.14 %    Psych/Spiritual Pre  25.71 %    Family Pre  28.5 %    GLOBAL Pre  27.13 %      Scores of 19 and below usually indicate a poorer quality of life in these areas.  A difference of  2-3 points is a clinically meaningful difference.  A difference of 2-3 points in the total score of the Quality of Life Index has been associated with significant improvement in overall quality of life, self-image, physical symptoms, and  general health in  studies assessing change in quality of life.  PHQ-9: Recent Review Flowsheet Data    Depression screen [facility] 2/9 06/21/2018 06/21/2016 06/09/2015   Decreased Interest 0 0 0   Down, Depressed, Hopeless 0 0 0   PHQ - 2 Score 0 0 0     Interpretation of Total Score  Total Score Depression Severity:  1-4 = Minimal depression, 5-9 = Mild depression, 10-14 = Moderate depression, 15-19 = Moderately severe depression, 20-27 = Severe depression   Psychosocial Evaluation and Intervention: Psychosocial Evaluation - 06/21/18 0747      Psychosocial Evaluation & Interventions   Interventions  Encouraged to exercise with the program and follow exercise prescription    Comments  No psychosocial needs identified.  No interventions necessary.     Expected Outcomes  Pt will exhibit a positive outlook with good coping skills.    Continue Psychosocial Services   No Follow up required       Psychosocial Re-Evaluation: Psychosocial Re-Evaluation    [place] Name 07/05/18 1116 07/28/18 1620 08/30/18 1738         Psychosocial Re-Evaluation   Current issues with  None Identified  None Identified  None Identified     Comments  No psychosocial needs identified.   No psychosocial needs identified.   No psychosocial needs identified.      Expected Outcomes  [name] will maintain a positive outlook with good coping skills.   [name] will maintain a positive outlook with good coping skills.   [name] will maintain a positive outlook with good coping skills.      Interventions  Encouraged to attend Cardiac Rehabilitation for the exercise  Encouraged to attend Cardiac Rehabilitation for the exercise  Encouraged to attend Cardiac Rehabilitation for the exercise     Continue Psychosocial Services   No Follow up required  No Follow up required  No Follow up required        Psychosocial Discharge (Final Psychosocial Re-Evaluation): Psychosocial Re-Evaluation - 08/30/18 1738      Psychosocial Re-Evaluation   Current issues with   None Identified    Comments  No psychosocial needs identified.     Expected Outcomes  [name] will maintain a positive outlook with good coping skills.     Interventions  Encouraged to attend Cardiac Rehabilitation for the exercise    Continue Psychosocial Services   No Follow up required       Vocational Rehabilitation: Provide vocational rehab assistance to qualifying candidates.   Vocational Rehab Evaluation & Intervention: Vocational Rehab - 06/15/18 0836      Initial Vocational Rehab Evaluation & Intervention   Assessment shows need for Vocational Rehabilitation  No       Education: Education Goals: Education classes will be provided on a weekly basis, covering required topics. Participant will state understanding/return demonstration of topics presented.  Learning Barriers/Preferences: Learning Barriers/Preferences - 06/15/18 3419      Learning Barriers/Preferences   Learning Barriers  Sight    Learning Preferences  Skilled Demonstration       Education Topics: Count Your Pulse:  -Group instruction provided by verbal instruction, demonstration, patient participation and written materials to support subject.  Instructors address importance of being able to find your pulse and how to count your pulse when at home without a heart monitor.  Patients get hands on experience counting their pulse with staff help and individually.   Heart Attack, Angina, and Risk Factor Modification:  -Group instruction provided by verbal instruction, video,  and written materials to support subject.  Instructors address signs and symptoms of angina and heart attacks.    Also discuss risk factors for heart disease and how to make changes to improve heart health risk factors.   Functional Fitness:  -Group instruction provided by verbal instruction, demonstration, patient participation, and written materials to support subject.  Instructors address safety measures for doing things around the house.   Discuss how to get up and down off the floor, how to pick things up properly, how to safely get out of a chair without assistance, and balance training.   Meditation and Mindfulness:  -Group instruction provided by verbal instruction, patient participation, and written materials to support subject.  Instructor addresses importance of mindfulness and meditation practice to help reduce stress and improve awareness.  Instructor also leads participants through a meditation exercise.    Stretching for Flexibility and Mobility:  -Group instruction provided by verbal instruction, patient participation, and written materials to support subject.  Instructors lead participants through series of stretches that are designed to increase flexibility thus improving mobility.  These stretches are additional exercise for major muscle groups that are typically performed during regular warm up and cool down.   Hands Only CPR:  -Group verbal, video, and participation provides a basic overview of AHA guidelines for community CPR. Role-play of emergencies allow participants the opportunity to practice calling for help and chest compression technique with discussion of AED use.   Hypertension: -Group verbal and written instruction that provides a basic overview of hypertension including the most recent diagnostic guidelines, risk factor reduction with self-care instructions and medication management.    Nutrition I class: Heart Healthy Eating:  -Group instruction provided by PowerPoint slides, verbal discussion, and written materials to support subject matter. The instructor gives an explanation and review of the Therapeutic Lifestyle Changes diet recommendations, which includes a discussion on lipid goals, dietary fat, sodium, fiber, plant stanol/sterol esters, sugar, and the components of a well-balanced, healthy diet.   Nutrition II class: Lifestyle Skills:  -Group instruction provided by PowerPoint slides,  verbal discussion, and written materials to support subject matter. The instructor gives an explanation and review of label reading, grocery shopping for heart health, heart healthy recipe modifications, and ways to make healthier choices when eating out.   Diabetes Question & Answer:  -Group instruction provided by PowerPoint slides, verbal discussion, and written materials to support subject matter. The instructor gives an explanation and review of diabetes co-morbidities, pre- and post-prandial blood glucose goals, pre-exercise blood glucose goals, signs, symptoms, and treatment of hypoglycemia and hyperglycemia, and foot care basics.   Diabetes Blitz:  -Group instruction provided by PowerPoint slides, verbal discussion, and written materials to support subject matter. The instructor gives an explanation and review of the physiology behind type 1 and type 2 diabetes, diabetes medications and rational behind using different medications, pre- and post-prandial blood glucose recommendations and Hemoglobin A1c goals, diabetes diet, and exercise including blood glucose guidelines for exercising safely.    Portion Distortion:  -Group instruction provided by PowerPoint slides, verbal discussion, written materials, and food models to support subject matter. The instructor gives an explanation of serving size versus portion size, changes in portions sizes over the last 20 years, and what consists of a serving from each food group.   Stress Management:  -Group instruction provided by verbal instruction, video, and written materials to support subject matter.  Instructors review role of stress in heart disease and how to cope with stress positively.  Exercising on Your Own:  -Group instruction provided by verbal instruction, power point, and written materials to support subject.  Instructors discuss benefits of exercise, components of exercise, frequency and intensity of exercise, and end points for  exercise.  Also discuss use of nitroglycerin and activating EMS.  Review options of places to exercise outside of rehab.  Review guidelines for sex with heart disease.   Cardiac Drugs I:  -Group instruction provided by verbal instruction and written materials to support subject.  Instructor reviews cardiac drug classes: antiplatelets, anticoagulants, beta blockers, and statins.  Instructor discusses reasons, side effects, and lifestyle considerations for each drug class.   CARDIAC REHAB PHASE II EXERCISE from 08/09/2018 in [place]  Date  08/09/18  Instruction Review Code  2- Demonstrated Understanding      Cardiac Drugs II:  -Group instruction provided by verbal instruction and written materials to support subject.  Instructor reviews cardiac drug classes: angiotensin converting enzyme inhibitors (ACE-I), angiotensin II receptor blockers (ARBs), nitrates, and calcium channel blockers.  Instructor discusses reasons, side effects, and lifestyle considerations for each drug class.   Anatomy and Physiology of the Circulatory System:  Group verbal and written instruction and models provide basic cardiac anatomy and physiology, with the coronary electrical and arterial systems. Review of: AMI, Angina, Valve disease, Heart Failure, Peripheral Artery Disease, Cardiac Arrhythmia, Pacemakers, and the ICD.   Other Education:  -Group or individual verbal, written, or video instructions that support the educational goals of the cardiac rehab program.   Holiday Eating Survival Tips:  -Group instruction provided by PowerPoint slides, verbal discussion, and written materials to support subject matter. The instructor gives patients tips, tricks, and techniques to help them not only survive but enjoy the holidays despite the onslaught of food that accompanies the holidays.   Knowledge Questionnaire Score: Knowledge Questionnaire Score - 06/15/18 0836      Knowledge  Questionnaire Score   Pre Score  21/24       Core Components/Risk Factors/Patient Goals at Admission: Personal Goals and Risk Factors at Admission - 06/15/18 0941      Core Components/Risk Factors/Patient Goals on Admission    Weight Management  Weight Loss       Core Components/Risk Factors/Patient Goals Review:  Goals and Risk Factor Review    Row Name 06/21/18 0743 07/05/18 1115 07/28/18 1620 08/30/18 1738       Core Components/Risk Factors/Patient Goals Review   Personal Goals Review  Weight Management/Obesity;Lipids  Weight Management/Obesity;Lipids  Weight Management/Obesity;Lipids  Weight Management/Obesity;Lipids    Review  Bill with few CAD RFs willing to participate in CR exercise. Bill would like to rebuild muscles.  Bill with few CAD RFs willing to participate in CR exercise. [name] is tolerating exercise well.  He originally had mentioned only participating in program for a month but since starting [name] has changed his mind and would like to continue.   Bill with few CAD RFs willing to participate in CR exercise. [name] is tolerating exercise well.  He feels that he is increasing his strength.  He has great attendance and feels that he is establishing an exercise routine.   Bill with few CAD RFs willing to participate in CR exercise. [name] is tolerating exercise well.  He feels that he is increasing his strength.  He has great attendance and will graduate soon.     Expected Outcomes  Pt will continue to participate in CR exercise, nutriton, and lifestyle opportunities.   Pt will  continue to participate in CR exercise, nutriton, and lifestyle opportunities.   Pt will continue to participate in CR exercise, nutriton, and lifestyle opportunities.   Pt will continue to participate in CR exercise, nutriton, and lifestyle opportunities.        Core Components/Risk Factors/Patient Goals at Discharge (Final Review):  Goals and Risk Factor Review - 08/30/18 1738      Core Components/Risk  Factors/Patient Goals Review   Personal Goals Review  Weight Management/Obesity;Lipids    Review  Bill with few CAD RFs willing to participate in CR exercise. [name] is tolerating exercise well.  He feels that he is increasing his strength.  He has great attendance and will graduate soon.     Expected Outcomes  Pt will continue to participate in CR exercise, nutriton, and lifestyle opportunities.        ITP Comments: ITP Comments    Row Name 06/15/18 0810 06/21/18 0743 07/05/18 1114 07/28/18 1620 08/30/18 1736   ITP Comments  Dr.. Fransico Him, Medical Director  Bill started exercise today and he tolerated exercise well.   30 Day ITP Review.  [name] is tolerating exercise well with his recent start.    30 Day ITP Review.  [name] is tolerating exercise well.  He feels that he is increasing his strength and establishing a routine.  He has great attendance.   30 Day ITP Review.  [name] is tolerating exercise well.  He continues to feel that he is increasing his strength. He is enjoying CR.      Comments: See ITP Comments.

## 2018-09-01 ENCOUNTER — Encounter (HOSPITAL_COMMUNITY)
Admission: RE | Admit: 2018-09-01 | Discharge: 2018-09-01 | Disposition: A | Discharge status: Home / Self Care | Payer: Medicare HMO | Source: Ambulatory Visit | Attending: Cardiology | Admitting: Cardiology

## 2018-09-01 ENCOUNTER — Encounter (HOSPITAL_COMMUNITY): Payer: Medicare HMO

## 2018-09-01 VITALS — Ht 68.0 in | Wt 197.5 lb

## 2018-09-01 DIAGNOSIS — Z87891 Personal history of nicotine dependence: Secondary | ICD-10-CM | POA: Diagnosis not present

## 2018-09-01 DIAGNOSIS — Z951 Presence of aortocoronary bypass graft: Secondary | ICD-10-CM

## 2018-09-01 DIAGNOSIS — E785 Hyperlipidemia, unspecified: Secondary | ICD-10-CM | POA: Diagnosis not present

## 2018-09-01 DIAGNOSIS — I2510 Atherosclerotic heart disease of native coronary artery without angina pectoris: Secondary | ICD-10-CM | POA: Diagnosis not present

## 2018-09-01 DIAGNOSIS — I10 Essential (primary) hypertension: Secondary | ICD-10-CM | POA: Diagnosis not present

## 2018-09-04 ENCOUNTER — Encounter (HOSPITAL_COMMUNITY): Payer: Medicare HMO

## 2018-09-04 ENCOUNTER — Encounter (HOSPITAL_COMMUNITY)
Admission: RE | Admit: 2018-09-04 | Discharge: 2018-09-04 | Disposition: A | Discharge status: Home / Self Care | Payer: Medicare HMO | Source: Ambulatory Visit | Attending: Cardiology | Admitting: Cardiology

## 2018-09-04 DIAGNOSIS — E785 Hyperlipidemia, unspecified: Secondary | ICD-10-CM | POA: Diagnosis not present

## 2018-09-04 DIAGNOSIS — I2510 Atherosclerotic heart disease of native coronary artery without angina pectoris: Secondary | ICD-10-CM | POA: Diagnosis not present

## 2018-09-04 DIAGNOSIS — I10 Essential (primary) hypertension: Secondary | ICD-10-CM | POA: Diagnosis not present

## 2018-09-04 DIAGNOSIS — Z951 Presence of aortocoronary bypass graft: Secondary | ICD-10-CM

## 2018-09-04 DIAGNOSIS — Z87891 Personal history of nicotine dependence: Secondary | ICD-10-CM | POA: Diagnosis not present

## 2018-09-06 ENCOUNTER — Encounter (HOSPITAL_COMMUNITY): Payer: Medicare HMO

## 2018-09-06 ENCOUNTER — Encounter (HOSPITAL_COMMUNITY)
Admission: RE | Admit: 2018-09-06 | Discharge: 2018-09-06 | Disposition: A | Discharge status: Home / Self Care | Payer: Medicare HMO | Source: Ambulatory Visit | Attending: Cardiology | Admitting: Cardiology

## 2018-09-06 DIAGNOSIS — I10 Essential (primary) hypertension: Secondary | ICD-10-CM | POA: Diagnosis not present

## 2018-09-06 DIAGNOSIS — Z87891 Personal history of nicotine dependence: Secondary | ICD-10-CM | POA: Diagnosis not present

## 2018-09-06 DIAGNOSIS — I2510 Atherosclerotic heart disease of native coronary artery without angina pectoris: Secondary | ICD-10-CM | POA: Diagnosis not present

## 2018-09-06 DIAGNOSIS — E785 Hyperlipidemia, unspecified: Secondary | ICD-10-CM | POA: Diagnosis not present

## 2018-09-06 DIAGNOSIS — Z951 Presence of aortocoronary bypass graft: Secondary | ICD-10-CM | POA: Diagnosis not present

## 2018-09-08 ENCOUNTER — Encounter (HOSPITAL_COMMUNITY): Payer: Medicare HMO

## 2018-09-08 ENCOUNTER — Encounter (HOSPITAL_COMMUNITY)
Admission: RE | Admit: 2018-09-08 | Discharge: 2018-09-08 | Disposition: A | Discharge status: Home / Self Care | Payer: Medicare HMO | Source: Ambulatory Visit | Attending: Cardiology | Admitting: Cardiology

## 2018-09-08 DIAGNOSIS — I10 Essential (primary) hypertension: Secondary | ICD-10-CM | POA: Diagnosis not present

## 2018-09-08 DIAGNOSIS — Z87891 Personal history of nicotine dependence: Secondary | ICD-10-CM | POA: Diagnosis not present

## 2018-09-08 DIAGNOSIS — E785 Hyperlipidemia, unspecified: Secondary | ICD-10-CM | POA: Diagnosis not present

## 2018-09-08 DIAGNOSIS — Z951 Presence of aortocoronary bypass graft: Secondary | ICD-10-CM | POA: Diagnosis not present

## 2018-09-08 DIAGNOSIS — I2510 Atherosclerotic heart disease of native coronary artery without angina pectoris: Secondary | ICD-10-CM | POA: Diagnosis not present

## 2018-09-11 ENCOUNTER — Encounter (HOSPITAL_COMMUNITY): Payer: Self-pay

## 2018-09-11 ENCOUNTER — Encounter (HOSPITAL_COMMUNITY)
Admission: RE | Admit: 2018-09-11 | Discharge: 2018-09-11 | Disposition: A | Discharge status: Home / Self Care | Payer: Medicare HMO | Source: Ambulatory Visit | Attending: Cardiology | Admitting: Cardiology

## 2018-09-11 ENCOUNTER — Encounter (HOSPITAL_COMMUNITY): Payer: Medicare HMO

## 2018-09-11 DIAGNOSIS — Z951 Presence of aortocoronary bypass graft: Secondary | ICD-10-CM

## 2018-09-11 DIAGNOSIS — E785 Hyperlipidemia, unspecified: Secondary | ICD-10-CM | POA: Diagnosis not present

## 2018-09-11 DIAGNOSIS — I10 Essential (primary) hypertension: Secondary | ICD-10-CM | POA: Diagnosis not present

## 2018-09-11 DIAGNOSIS — I2510 Atherosclerotic heart disease of native coronary artery without angina pectoris: Secondary | ICD-10-CM | POA: Diagnosis not present

## 2018-09-11 DIAGNOSIS — Z87891 Personal history of nicotine dependence: Secondary | ICD-10-CM | POA: Diagnosis not present

## 2018-09-13 ENCOUNTER — Encounter (HOSPITAL_COMMUNITY): Payer: Medicare HMO

## 2018-09-15 ENCOUNTER — Encounter (HOSPITAL_COMMUNITY): Payer: Medicare HMO

## 2018-09-15 NOTE — Progress Notes (Signed)
Discharge Progress Report  Patient Details  Name: Vincent Stevens MRN: [id number] Date of Birth: 02-11-1946 Referring Provider:     [name] from 06/15/2018 in [place]  Referring Provider  Vincent Priest MD        Number of Visits: 36  Reason for Discharge:  Patient reached a stable level of exercise. Patient independent in their exercise. Patient has met program and personal goals.  Smoking History:  Social History   Tobacco Use  Smoking Status Former Smoker  . Last attempt to quit: 11/29/1980  . 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  ADL UCSD:   Initial Exercise Prescription: Initial Exercise Prescription - 06/15/18 0900      Date of Initial Exercise RX and Referring Provider   Date  06/15/18    Referring Provider  Vincent Priest MD     Expected Discharge Date  09/22/18      Bike   Level  0.8    Minutes  10    METs  2.68      NuStep   Level  2    SPM  75    Minutes  10    METs  2.5      Track   Laps  13    Minutes  10    METs  3.3      Prescription Details   Frequency (times per week)  3x    Duration  Progress to 30 minutes of continuous aerobic without signs/symptoms of physical distress      Intensity   THRR 40-80% of Max Heartrate  59-118    Ratings of Perceived Exertion  11-13    Perceived Dyspnea  0-4      Progression   Progression  Continue progressive overload as per policy without signs/symptoms or physical distress.      Resistance Training   Training Prescription  Yes    Weight  3lbs    Reps  10-15       Discharge Exercise Prescription (Final Exercise Prescription Changes): Exercise Prescription Changes - 09/11/18 0648      Response to Exercise   Blood Pressure (Admit)  116/70    Blood Pressure (Exercise)  138/70    Blood Pressure (Exit)  104/70    Heart Rate (Admit)  88 bpm    Heart Rate (Exercise)  112 bpm     Heart Rate (Exit)  77 bpm    Rating of Perceived Exertion (Exercise)  12    Perceived Dyspnea (Exercise)  0    Symptoms  None    Comments  Pt graduated from Cardiac Rehab     Duration  Continue with 30 min of aerobic exercise without signs/symptoms of physical distress.    Intensity  THRR unchanged      Progression   Progression  Continue to progress workloads to maintain intensity without signs/symptoms of physical distress.    Average METs  3.7      Resistance Training   Training Prescription  Yes    Weight  4lbs    Reps  10-15    Time  10 Minutes      Interval Training   Interval Training  No      Bike   Level  1.2    Minutes  10    METs  3.53      NuStep  Level  5    SPM  100    Minutes  10    METs  3.8      Track   Laps  16    Minutes  10    METs  3.78      Home Exercise Plan   Plans to continue exercise at  [facility] (comment)   Walking   Frequency  Add 3 additional days to program exercise sessions.    Initial Home Exercises Provided  07/12/18       Functional Capacity: 6 Minute Walk    Row Name 06/15/18 0927 09/01/18 0934       6 Minute Walk   Phase  Initial  Discharge    Distance  1725 feet  1895 feet    Distance % Change  -  9.86 %    Distance Feet Change  -  170 ft    Walk Time  6 minutes  6 minutes    # of Rest Breaks  0  0    MPH  3.26  3.59    METS  3.36  3.83    RPE  12  12    Perceived Dyspnea   0  0    VO2 Peak  11.77  13.39    Symptoms  Yes (comment)  No    Comments  Left Knee Pain +6  Left Knee Pain +8    Resting HR  71 bpm  78 bpm    Resting BP  102/70  120/70    Resting Oxygen Saturation   97 %  -    Exercise Oxygen Saturation  during 6 min walk  96 %  -    Max Ex. HR  109 bpm  110 bpm    Max Ex. BP  122/80  142/80    2 Minute Post BP  122/70  112/70       Psychological, QOL, Others - Outcomes: PHQ 2/9: Depression screen [facility] 2/9 09/11/2018 06/21/2018 06/21/2016 06/09/2015  Decreased Interest 0 0 0 0  Down,  Depressed, Hopeless 0 0 0 0  PHQ - 2 Score 0 0 0 0    Quality of Life: Quality of Life - 09/14/18 0639      Quality of Life   Select  Quality of Life      Quality of Life Scores   Health/Function Pre  26.4 %    Health/Function Post  25.53 %    Health/Function % Change  -3.3 %    Socioeconomic Pre  29.14 %    Socioeconomic Post  30 %    Socioeconomic % Change   2.95 %    Psych/Spiritual Pre  25.71 %    Psych/Spiritual Post  27.86 %    Psych/Spiritual % Change  8.36 %    Family Pre  28.5 %    Family Post  28 %    Family % Change  -1.75 %    GLOBAL Pre  27.13 %    GLOBAL Post  27.37 %    GLOBAL % Change  0.88 %       Personal Goals: Goals established at orientation with interventions provided to work toward goal. Personal Goals and Risk Factors at Admission - 06/15/18 0941      Core Components/Risk Factors/Patient Goals on Admission    Weight Management  Weight Loss        Personal Goals Discharge: Goals and Risk Factor Review    Row Name 06/21/18  3748 07/05/18 1115 07/28/18 1620 08/30/18 1738 09/11/18 0750     Core Components/Risk Factors/Patient Goals Review   Personal Goals Review  Weight Management/Obesity;Lipids  Weight Management/Obesity;Lipids  Weight Management/Obesity;Lipids  Weight Management/Obesity;Lipids  Weight Management/Obesity;Lipids   Review  Vincent Stevens with few CAD RFs willing to participate in CR exercise. Vincent Stevens would like to rebuild muscles.  Vincent Stevens with few CAD RFs willing to participate in CR exercise. Vincent Stevens is tolerating exercise well.  He originally had mentioned only participating in program for a month but since starting Vincent Stevens has changed his mind and would like to continue.   Vincent Stevens with few CAD RFs willing to participate in CR exercise. Vincent Stevens is tolerating exercise well.  He feels that he is increasing his strength.  He has great attendance and feels that he is establishing an exercise routine.   Vincent Stevens with few CAD RFs willing to participate in CR exercise. Vincent Stevens  is tolerating exercise well.  He feels that he is increasing his strength.  He has great attendance and will graduate soon.   Vincent Stevens has graduated from the cardiac rehab program with 36 sessions.  He increased his strength and motivation.     Expected Outcomes  Pt will continue to participate in CR exercise, nutriton, and lifestyle opportunities.   Pt will continue to participate in CR exercise, nutriton, and lifestyle opportunities.   Pt will continue to participate in CR exercise, nutriton, and lifestyle opportunities.   Pt will continue to participate in CR exercise, nutriton, and lifestyle opportunities.   Pt will continue to participate in exercise, nutriton, and lifestyle opportunities. Vincent Stevens plans to utilize [employer] to work out with his wife.      Exercise Goals and Review: Exercise Goals    Row Name 06/15/18 2707             Exercise Goals   Increase Physical Activity  Yes       Intervention  Provide advice, education, support and counseling about physical activity/exercise needs.;Develop an individualized exercise prescription for aerobic and resistive training based on initial evaluation findings, risk stratification, comorbidities and participant's personal goals.       Expected Outcomes  Short Term: Attend rehab on a regular basis to increase amount of physical activity.;Long Term: Add in home exercise to make exercise part of routine and to increase amount of physical activity.;Long Term: Exercising regularly at least 3-5 days a week.       Increase Strength and Stamina  Yes increase muscle tone       Intervention  Provide advice, education, support and counseling about physical activity/exercise needs.;Develop an individualized exercise prescription for aerobic and resistive training based on initial evaluation findings, risk stratification, comorbidities and participant's personal goals.       Expected Outcomes  Short Term: Increase workloads from initial exercise  prescription for resistance, speed, and METs.;Short Term: Perform resistance training exercises routinely during rehab and add in resistance training at home;Long Term: Improve cardiorespiratory fitness, muscular endurance and strength as measured by increased METs and functional capacity (6MWT)       Able to understand and use rate of perceived exertion (RPE) scale  Yes       Intervention  Provide education and explanation on how to use RPE scale       Expected Outcomes  Short Term: Able to use RPE daily in rehab to express subjective intensity level;Long Term:  Able to use RPE to guide intensity level when exercising independently  Knowledge and understanding of Target Heart Rate Range (THRR)  Yes       Intervention  Provide education and explanation of THRR including how the numbers were predicted and where they are located for reference       Expected Outcomes  Short Term: Able to state/look up THRR;Long Term: Able to use THRR to govern intensity when exercising independently;Short Term: Able to use daily as guideline for intensity in rehab       Able to check pulse independently  Yes       Intervention  Provide education and demonstration on how to check pulse in carotid and radial arteries.;Review the importance of being able to check your own pulse for safety during independent exercise       Expected Outcomes  Short Term: Able to explain why pulse checking is important during independent exercise;Long Term: Able to check pulse independently and accurately          Nutrition & Weight - Outcomes: Pre Biometrics - 06/15/18 0928      Pre Biometrics   Height  5' 8" (1.727 m)    Weight  89.3 kg    Waist Circumference  42.5 inches    Hip Circumference  41.25 inches    Waist to Hip Ratio  1.03 %    BMI (Calculated)  29.94    Triceps Skinfold  26 mm    % Body Fat  32 %    Grip Strength  35 kg    Flexibility  0 in    Single Leg Stand  2.12 seconds      Post Biometrics - 09/01/18 0935        Post  Biometrics   Height  5' 8" (1.727 m)    Weight  89.6 kg    Waist Circumference  42 inches    Hip Circumference  41 inches    Waist to Hip Ratio  1.02 %    BMI (Calculated)  30.04    Triceps Skinfold  18 mm    % Body Fat  30.3 %    Grip Strength  36 kg    Flexibility  14 in    Single Leg Stand  1.66 seconds       Nutrition: Nutrition Therapy & Goals - 06/15/18 1124      Nutrition Therapy   Diet  consistent carbohydrate heart healthy      Personal Nutrition Goals   Nutrition Goal  Pt to identify food quantities necessary to achieve weight loss of 6-24 lbs. at graduation from cardiac rehab. Goal wt of 10 lb desired.     Personal Goal #2  Pt to identify and limit food sources of saturated fat, trans fat, and sodium      Intervention Plan   Intervention  Prescribe, educate and counsel regarding individualized specific dietary modifications aiming towards targeted core components such as weight, hypertension, lipid management, diabetes, heart failure and other comorbidities.    Expected Outcomes  Short Term Goal: Understand basic principles of dietary content, such as calories, fat, sodium, cholesterol and nutrients.       Nutrition Discharge: Nutrition Assessments - 09/15/18 1211      MEDFICTS Scores   Pre Score  18    Post Score  13    Score Difference  -5       Education Questionnaire Score: Knowledge Questionnaire Score - 09/14/18 0639      Knowledge Questionnaire Score   Pre Score  21/24  Post Score  21/24       Goals reviewed with patient; copy given to patient.

## 2018-09-18 ENCOUNTER — Encounter (HOSPITAL_COMMUNITY): Payer: Medicare HMO

## 2018-09-20 ENCOUNTER — Encounter (HOSPITAL_COMMUNITY): Payer: Medicare HMO

## 2018-09-21 ENCOUNTER — Telehealth: Payer: Self-pay | Admitting: *Deleted

## 2018-09-21 NOTE — Telephone Encounter (Signed)
Pt phoned to say has been off of cholesterol meds for 3 weeks. Has Simvastatin left if you want him to go back on this med. The Rosuvastatin caused legs to ache and cramping. Since he has been off of it pain has gotten much better. Send in 90 day supply of cholesterol medicine you would like him to take to [facility].

## 2018-09-21 NOTE — Telephone Encounter (Signed)
Simvastatin 20 mg day # 90 refill 3

## 2018-09-22 ENCOUNTER — Encounter (HOSPITAL_COMMUNITY): Payer: Medicare HMO

## 2018-09-22 MED ORDER — SIMVASTATIN 20 MG PO TABS: 20.0000 mg | ORAL_TABLET | Freq: Every day | ORAL | 3 refills | Status: DC

## 2018-09-22 NOTE — Addendum Note (Signed)
Addended by: Austin Miles on: 09/22/2018 08:59 AM   Modules accepted: Orders

## 2018-09-22 NOTE — Telephone Encounter (Signed)
Patient informed to restart simvastatin 20 mg daily per Dr. Bettina Gavia. Patient agreeable, refill sent to [place] as requested. No further questions.

## 2018-12-08 ENCOUNTER — Other Ambulatory Visit: Payer: Self-pay | Admitting: Internal Medicine

## 2019-01-02 ENCOUNTER — Other Ambulatory Visit: Payer: Self-pay

## 2019-01-02 ENCOUNTER — Telehealth: Payer: Self-pay | Admitting: Family Medicine

## 2019-01-02 MED ORDER — TAMSULOSIN HCL 0.4 MG PO CAPS: 0.4000 mg | ORAL_CAPSULE | Freq: Every day | ORAL | 1 refills | Status: DC

## 2019-01-02 NOTE — Telephone Encounter (Signed)
MEDICATION: tamsulosin (FLOMAX) 0.4 MG CAPS capsule  PHARMACY:   [place], [place] [phone] ([phone] (Fax)    Patient says that fax number for [employer] is [phone]  IS THIS A 90 DAY SUPPLY : Yes  IS PATIENT OUT OF MEDICATION: No  IF NOT; HOW MUCH IS LEFT: About enough for another week  LAST APPOINTMENT DATE: @9 /17/2019  NEXT APPOINTMENT DATE:@Visit  date not found  OTHER COMMENTS:    **Let patient know to contact pharmacy at the end of the day to make sure medication is ready. **  ** Please notify patient to allow 48-72 hours to process**  **Encourage patient to contact the pharmacy for refills or they can request refills through [facility]**

## 2019-01-02 NOTE — Telephone Encounter (Signed)
Rx sent to pharmacy   

## 2019-01-08 ENCOUNTER — Telehealth: Payer: Self-pay | Admitting: Family Medicine

## 2019-01-08 NOTE — Telephone Encounter (Signed)
Patient would like tamsulosin (FLOMAX) 0.4 MG CAPS capsule to be sent to  [place], [place] [phone] (Phone) [phone] (Fax)  He said it [facility] but would rather it be sent to [facility] its cheaper for him.

## 2019-01-15 ENCOUNTER — Other Ambulatory Visit: Payer: Self-pay

## 2019-01-15 MED ORDER — TAMSULOSIN HCL 0.4 MG PO CAPS: 0.4000 mg | ORAL_CAPSULE | Freq: Every day | ORAL | 1 refills | Status: DC

## 2019-01-15 NOTE — Telephone Encounter (Signed)
Pt states he was given a fax number to [facility]:  [phone]

## 2019-01-15 NOTE — Telephone Encounter (Signed)
See note

## 2019-01-15 NOTE — Telephone Encounter (Signed)
Rx sent to pharmacy   

## 2019-05-22 ENCOUNTER — Telehealth: Payer: Self-pay | Admitting: Cardiology

## 2019-05-22 DIAGNOSIS — I10 Essential (primary) hypertension: Secondary | ICD-10-CM

## 2019-05-22 DIAGNOSIS — I25119 Atherosclerotic heart disease of native coronary artery with unspecified angina pectoris: Secondary | ICD-10-CM

## 2019-05-22 DIAGNOSIS — Z951 Presence of aortocoronary bypass graft: Secondary | ICD-10-CM

## 2019-05-22 NOTE — Telephone Encounter (Signed)
Set up echo ASAP, see me Thursday

## 2019-05-22 NOTE — Telephone Encounter (Signed)
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 Last seen 07/2018. He stated this is due by 05/27/2019. I told him he would more than likely need to be seen since it had been almost a year but that was up to the doctor. Please let me know how you would like to handle. I have the paperwork at my desk.

## 2019-05-22 NOTE — Telephone Encounter (Signed)
Echocardiogram order has been placed. Please schedule testing and follow up appointment with Dr. Bettina Gavia as recommended. Thanks!

## 2019-05-23 ENCOUNTER — Ambulatory Visit (HOSPITAL_COMMUNITY)
Admission: RE | Admit: 2019-05-23 | Discharge: 2019-05-23 | Disposition: A | Discharge status: Home / Self Care | Payer: Medicare HMO | Source: Ambulatory Visit | Attending: Cardiology | Admitting: Cardiology

## 2019-05-23 ENCOUNTER — Other Ambulatory Visit: Payer: Self-pay

## 2019-05-23 DIAGNOSIS — I25119 Atherosclerotic heart disease of native coronary artery with unspecified angina pectoris: Secondary | ICD-10-CM

## 2019-05-23 DIAGNOSIS — Z951 Presence of aortocoronary bypass graft: Secondary | ICD-10-CM | POA: Diagnosis not present

## 2019-05-23 DIAGNOSIS — I25810 Atherosclerosis of coronary artery bypass graft(s) without angina pectoris: Secondary | ICD-10-CM | POA: Diagnosis not present

## 2019-05-23 DIAGNOSIS — E785 Hyperlipidemia, unspecified: Secondary | ICD-10-CM | POA: Diagnosis not present

## 2019-05-23 DIAGNOSIS — Z87891 Personal history of nicotine dependence: Secondary | ICD-10-CM | POA: Diagnosis not present

## 2019-05-23 DIAGNOSIS — I371 Nonrheumatic pulmonary valve insufficiency: Secondary | ICD-10-CM | POA: Insufficient documentation

## 2019-05-23 DIAGNOSIS — I10 Essential (primary) hypertension: Secondary | ICD-10-CM | POA: Insufficient documentation

## 2019-05-23 NOTE — Progress Notes (Signed)
Cardiology Office Note:    Date:  05/24/2019   [name], DOB [birth year], MRN [id number]  PCP:  Vivi Barrack, MD  Cardiologist:  Shirlee More, MD    Referring MD: Vivi Barrack, MD    ASSESSMENT:    CAD Hypertension Hyperlipidemia PLAN:    In order of problems listed above:  1. 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 2. Stable blood pressure target continue current treatment 3. Hyperlipidemia stable continue statin check liver function lipid profile   Next appointment: 1 year   Medication Adjustments/Labs and Tests Ordered: Current medicines are reviewed at length with the patient today.  Concerns regarding medicines are outlined above.  Orders Placed This Encounter  Procedures  . CT CORONARY FRACTIONAL FLOW RESERVE DATA PREP  . CT CORONARY FRACTIONAL FLOW RESERVE FLUID ANALYSIS  . CT CORONARY MORPH W/CTA COR W/SCORE W/CA W/CM &/OR WO/CM  . Basic Metabolic Panel (BMET)   Meds ordered this encounter  Medications  . metoprolol tartrate (LOPRESSOR) 50 MG tablet    Sig: Take 2 hours prior to CT    Dispense:  2 tablet    Refill:  0  . nitroGLYCERIN (NITROSTAT) 0.4 MG SL tablet    Sig: Place 1 tablet (0.4 mg total) under the tongue every 5 (five) minutes as needed for chest pain.    Dispense:  30 tablet    Refill:  3    No chief complaint on file.   History of Present Illness:    Vincent Stevens is a 73 y.o. 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  He is doing very well from a cardiac perspective no angina dyspnea palpitation or syncope and compliant with treatment including aspirin statin beta-blocker.  His echocardiogram was normal. Past Medical History:   Diagnosis Date  . Allergy   . Benign prostatic hypertrophy   . BENIGN PROSTATIC HYPERTROPHY 09/19/2009   Qualifier: Diagnosis of  By: Burnice Logan  MD, Doretha Sou   . Chest pain 02/23/2018  . Dyslipidemia 08/14/2010   Qualifier: Diagnosis of  By: Burnice Logan  MD, Doretha Sou   . Essential hypertension 01/17/2009   Qualifier: Diagnosis of  By: Burnice Logan  MD, Doretha Sou   . Gout   . GOUT, ACUTE 11/07/2009   Qualifier: Diagnosis of  By: Burnice Logan  MD, Doretha Sou   . History of colonic polyps 09/27/2008   Qualifier: Diagnosis of  By: Burnice Logan  MD, Doretha Sou  Initial colonoscopy 2003.  Hyperplastic polyps only Followup colonoscopy 2008.  Normal  10 year interval recommended   . Hyperlipidemia   . Hypertension   . Impaired glucose tolerance 02/20/2013  . Left main coronary artery disease   . Melanoma ([place])   . SOB (shortness of breath) 02/23/2018    Past Surgical History:  Procedure Laterality Date  . CORONARY ARTERY BYPASS GRAFT N/A 03/01/2018   Procedure: CORONARY ARTERY BYPASS GRAFTING (CABG) times three using the right saphaneous vein. Harvested endoscopicly; and left internal mammary artery.;  Surgeon: Gaye Pollack, MD;  Location: [facility] OR;  Service: Open Heart Surgery;  Laterality: N/A;  . HERNIA REPAIR    . LEFT HEART CATH AND CORONARY ANGIOGRAPHY N/A 03/01/2018   Procedure: LEFT  HEART CATH AND CORONARY ANGIOGRAPHY;  Surgeon: Leonie Man, MD;  Location: [facility];  Service: Cardiovascular;  Laterality: N/A;  . melanoma removal    . TEE WITHOUT CARDIOVERSION N/A 03/01/2018   Procedure: TRANSESOPHAGEAL ECHOCARDIOGRAM (TEE);  Surgeon: Gaye Pollack, MD;  Location: [facility];  Service: Open Heart Surgery;  Laterality: N/A;    Current Medications: Current Meds  Medication Sig  . aspirin EC 81 MG tablet Take 1 tablet (81 mg total) by mouth daily.  Marland Kitchen ibuprofen (ADVIL,MOTRIN) 200 MG tablet Take 200 mg by mouth 2 (two) times daily as needed for mild pain or cramping.  . Multiple Vitamin  (MULTIVITAMIN) tablet Take 1 tablet by mouth daily.  . tamsulosin (FLOMAX) 0.4 MG CAPS capsule Take 1 capsule (0.4 mg total) by mouth daily.     Allergies:   Sulfamethoxazole   Social History   Socioeconomic History  . Marital status: Married    Spouse name: Not on file  . Number of children: 1  . Years of education: Not on file  . Highest education level: Not on file  Occupational History  . Not on file  Social Needs  . Financial resource strain: Not on file  . Food insecurity    Worry: Not on file    Inability: Not on file  . Transportation needs    Medical: Not on file    Non-medical: Not on file  Tobacco Use  . Smoking status: Former Smoker    Quit date: 11/29/1980    Years since quitting: 38.5  . Smokeless tobacco: Never Used  Substance and Sexual Activity  . Alcohol use: No  . Drug use: No  . Sexual activity: Not on file  Lifestyle  . Physical activity    Days per week: Not on file    Minutes per session: Not on file  . Stress: Not on file  Relationships  . Social Herbalist on phone: Not on file    Gets together: Not on file    Attends religious service: Not on file    Active member of club or organization: Not on file    Attends meetings of clubs or organizations: Not on file    Relationship status: Not on file  Other Topics Concern  . Not on file  Social History Narrative  . Not on file     Family History: The patient's family history includes Cancer in his sister; Dementia in his mother; Diabetes in an other family member; Heart attack in his father; Hypertension in an other family member. ROS:   Please see the history of present illness.    All other systems reviewed and are negative.  EKGs/Labs/Other Studies Reviewed:    The following studies were reviewed today:  EKG:  EKG PERFORMED 08/18/1989 SHOWED SINUS BRADYCARDIA 54 BPM OTHERWISE NORMAL  ECHO 05/23/2019:  1. The left ventricle has normal systolic function, with an ejection  fraction of 55-60%. The cavity size was normal. Left ventricular diastolic Doppler parameters are consistent with impaired relaxation.  2. The right ventricle has normal systolic function. The cavity was normal.  3. The mitral valve is grossly normal.  4. The tricuspid valve is grossly normal.  5. The aortic valve is tricuspid. Mild thickening of the aortic valve. No stenosis of the aortic valve.  6. Normal LV systolic function; mild diastoilc dysfunction; trace MR.   Recent Labs: No results found for requested labs within last 8760 hours.  Recent Lipid Panel  Component Value Date/Time   CHOL 134 04/19/2018 0810   TRIG 105 04/19/2018 0810   HDL 43 04/19/2018 0810   CHOLHDL 3.1 04/19/2018 0810   CHOLHDL 3 06/24/2017 1226   VLDL 20.8 06/24/2017 1226   LDLCALC 70 04/19/2018 0810    Physical Exam:    VS:  BP (!) 164/88 (BP Location: Right Arm, Patient Position: Sitting, Cuff Size: Normal)   Pulse (!) 56   Temp 98.1 F (36.7 C)   Ht 5\' 8"  (1.727 m)   Wt 200 lb 1.9 oz (90.8 kg)   SpO2 98%   BMI 30.43 kg/m     Wt Readings from Last 3 Encounters:  05/24/19 200 lb 1.9 oz (90.8 kg)  09/01/18 197 lb 8.5 oz (89.6 kg)  08/18/18 195 lb 1.9 oz (88.5 kg)     GEN:  Well nourished, well developed in no acute distress HEENT: Normal NECK: No JVD; No carotid bruits LYMPHATICS: No lymphadenopathy CARDIAC: RRR, no murmurs, rubs, gallops RESPIRATORY:  Clear to auscultation without rales, wheezing or rhonchi  ABDOMEN: Soft, non-tender, non-distended MUSCULOSKELETAL:  No edema; No deformity  SKIN: Warm and dry NEUROLOGIC:  Alert and oriented x 3 PSYCHIATRIC:  Normal affect    Signed, Shirlee More, MD  05/24/2019 2:32 PM    [facility]

## 2019-05-23 NOTE — Progress Notes (Signed)
  Echocardiogram 2D Echocardiogram has been performed.  Vincent Stevens 05/23/2019, 11:36 AM

## 2019-05-24 ENCOUNTER — Encounter: Payer: Self-pay | Admitting: Cardiology

## 2019-05-24 ENCOUNTER — Other Ambulatory Visit: Payer: Self-pay

## 2019-05-24 ENCOUNTER — Ambulatory Visit (INDEPENDENT_AMBULATORY_CARE_PROVIDER_SITE_OTHER): Payer: Medicare HMO | Admitting: Cardiology

## 2019-05-24 VITALS — BP 164/88 | HR 56 | Temp 98.1°F | Ht 68.0 in | Wt 200.1 lb

## 2019-05-24 DIAGNOSIS — I25119 Atherosclerotic heart disease of native coronary artery with unspecified angina pectoris: Secondary | ICD-10-CM | POA: Diagnosis not present

## 2019-05-24 DIAGNOSIS — I10 Essential (primary) hypertension: Secondary | ICD-10-CM

## 2019-05-24 DIAGNOSIS — E785 Hyperlipidemia, unspecified: Secondary | ICD-10-CM | POA: Diagnosis not present

## 2019-05-24 DIAGNOSIS — I209 Angina pectoris, unspecified: Secondary | ICD-10-CM | POA: Diagnosis not present

## 2019-05-24 MED ORDER — NITROGLYCERIN 0.4 MG SL SUBL: 0.4000 mg | SUBLINGUAL_TABLET | SUBLINGUAL | 3 refills | Status: DC | PRN

## 2019-05-24 MED ORDER — METOPROLOL TARTRATE 50 MG PO TABS: ORAL_TABLET | ORAL | 0 refills | Status: DC

## 2019-05-24 MED FILL — METOPROLOL TARTRATE 50 MG T: 50 | 1 days supply | Qty: 2 | Fill #0

## 2019-05-24 MED FILL — NITROGLYCERIN 0.4 MG TAB SL: 0.4 | 8 days supply | Qty: 25 | Fill #0

## 2019-05-24 NOTE — Patient Instructions (Addendum)
Medication Instructions:  Your physician recommends that you continue on your current medications as directed. Please refer to the Current Medication list given to you today.  If you need a refill on your cardiac medications before your next appointment, please call your pharmacy.   Lab work: Your physician recommends that you return for lab work today: CMP, lipid panel.   If you have labs (blood work) drawn today and your tests are completely normal, you will receive your results only by: Marland Kitchen MyChart Message (if you have MyChart) OR . A paper copy in the mail If you have any lab test that is abnormal or we need to change your treatment, we will call you to review the results.  Testing/Procedures: None   Follow-Up: At [facility], you and your health needs are our priority.  As part of our continuing mission to provide you with exceptional heart care, we have created designated Provider Care Teams.  These Care Teams include your primary Cardiologist (physician) and Advanced Practice Providers (APPs -  Physician Assistants and Nurse Practitioners) who all work together to provide you with the care you need, when you need it. You will need a follow up appointment in 1 years.

## 2019-05-25 LAB — COMPREHENSIVE METABOLIC PANEL
ALT: 21 IU/L (ref 0–44)
AST: 30 IU/L (ref 0–40)
Albumin/Globulin Ratio: 2.1 (ref 1.2–2.2)
Albumin: 4.9 g/dL — ABNORMAL HIGH (ref 3.7–4.7)
Alkaline Phosphatase: 66 IU/L (ref 39–117)
BUN/Creatinine Ratio: 14 (ref 10–24)
BUN: 15 mg/dL (ref 8–27)
Bilirubin Total: 0.7 mg/dL (ref 0.0–1.2)
CO2: 20 mmol/L (ref 20–29)
Calcium: 9.6 mg/dL (ref 8.6–10.2)
Chloride: 102 mmol/L (ref 96–106)
Creatinine, Ser: 1.05 mg/dL (ref 0.76–1.27)
GFR calc Af Amer: 80 mL/min/{1.73_m2} (ref >59)
GFR calc non Af Amer: 70 mL/min/{1.73_m2} (ref >59)
Globulin, Total: 2.3 g/dL (ref 1.5–4.5)
Glucose: 101 mg/dL — ABNORMAL HIGH (ref 65–99)
Potassium: 4.8 mmol/L (ref 3.5–5.2)
Sodium: 139 mmol/L (ref 134–144)
Total Protein: 7.2 g/dL (ref 6.0–8.5)

## 2019-05-25 LAB — LIPID PANEL
Chol/HDL Ratio: 3.5 ratio (ref 0.0–5.0)
Cholesterol, Total: 180 mg/dL (ref 100–199)
HDL: 52 mg/dL (ref >39)
LDL Calculated: 105 mg/dL — ABNORMAL HIGH (ref 0–99)
Triglycerides: 113 mg/dL (ref 0–149)
VLDL Cholesterol Cal: 23 mg/dL (ref 5–40)

## 2019-05-29 ENCOUNTER — Telehealth: Payer: Self-pay | Admitting: *Deleted

## 2019-05-29 DIAGNOSIS — E785 Hyperlipidemia, unspecified: Secondary | ICD-10-CM

## 2019-05-29 MED ORDER — SIMVASTATIN 20 MG PO TABS: 40.0000 mg | ORAL_TABLET | Freq: Every day | ORAL | 3 refills | Status: DC

## 2019-05-29 NOTE — Telephone Encounter (Signed)
Patient's wife, Arbie Cookey, per [facility] informed of lab results and advised to double patient's dose of simvastatin from 20 mg to 40 mg daily at bedtime. Arbie Cookey verbalized understanding and denies needing this medication refilled at this time. Informed Arbie Cookey to have patient return to our [facility] office in 6 weeks for repeat lab work. No appointment needed, fast beforehand. Arbie Cookey is agreeable. No further questions.

## 2019-05-29 NOTE — Telephone Encounter (Signed)
-----   Message from Park Liter, MD sent at 05/28/2019  8:23 AM EDT ----- ldl still elevated,  Double simvastatin  flp in 6 weeks

## 2019-05-30 ENCOUNTER — Ambulatory Visit: Payer: Medicare HMO | Admitting: Cardiology

## 2019-06-06 ENCOUNTER — Other Ambulatory Visit: Payer: Self-pay | Admitting: Family Medicine

## 2019-07-09 DIAGNOSIS — E785 Hyperlipidemia, unspecified: Secondary | ICD-10-CM | POA: Diagnosis not present

## 2019-07-10 LAB — LIPID PANEL
Chol/HDL Ratio: 3 ratio (ref 0.0–5.0)
Cholesterol, Total: 148 mg/dL (ref 100–199)
HDL: 49 mg/dL (ref >39)
LDL Calculated: 82 mg/dL (ref 0–99)
Triglycerides: 86 mg/dL (ref 0–149)
VLDL Cholesterol Cal: 17 mg/dL (ref 5–40)

## 2019-07-11 ENCOUNTER — Telehealth: Payer: Self-pay | Admitting: Emergency Medicine

## 2019-07-11 DIAGNOSIS — E785 Hyperlipidemia, unspecified: Secondary | ICD-10-CM

## 2019-07-11 MED ORDER — SIMVASTATIN 80 MG PO TABS: 80.0000 mg | ORAL_TABLET | Freq: Every day | ORAL | 1 refills | Status: DC

## 2019-07-11 NOTE — Telephone Encounter (Signed)
Patients wife advised of patients lab results and for patient to increase simvastatin to 80 mg daily and have labs redrawn in 6 weeks. She verbally understood. No further questions.

## 2019-07-16 LAB — HEMOGLOBIN A1C: Hemoglobin A1C: 6.6

## 2019-07-19 MED ORDER — SIMVASTATIN 80 MG PO TABS: 80.0000 mg | ORAL_TABLET | Freq: Every day | ORAL | 2 refills | Status: DC

## 2019-07-19 NOTE — Telephone Encounter (Signed)
Refill for simvastatin sent to [facility] as requested.

## 2019-07-19 NOTE — Addendum Note (Signed)
Addended by: Austin Miles on: 07/19/2019 04:54 PM   Modules accepted: Orders

## 2019-07-19 NOTE — Telephone Encounter (Signed)
Patient is asking for his Simvastatin script to be sent to [facility]

## 2019-07-30 ENCOUNTER — Other Ambulatory Visit: Payer: Self-pay

## 2019-07-30 MED ORDER — SIMVASTATIN 80 MG PO TABS: 80.0000 mg | ORAL_TABLET | Freq: Every day | ORAL | 2 refills | Status: DC

## 2019-07-31 ENCOUNTER — Telehealth: Payer: Self-pay | Admitting: Cardiology

## 2019-07-31 NOTE — Telephone Encounter (Signed)
Patient is asking for [facility] pharmacy to be called to clarify his simvastatin. Pharmacy states they are questioning the 80mg  tablet.

## 2019-07-31 NOTE — Telephone Encounter (Signed)
Spoke with [facility] and they did receive fax from last week with Dr Joya Gaskins approval of Simvastatin 80mg  one tablet daily.  Medication was mailed yesterday and patient should be receiving it in the mail today or tomorrow.   Phone call to patient and he is aware that Simvastatin has been mailed.  He will contact our office if he does not receive in the next few days. Patient agreed to plan and verbalized understanding. No further questions.

## 2019-08-27 DIAGNOSIS — D1801 Hemangioma of skin and subcutaneous tissue: Secondary | ICD-10-CM | POA: Diagnosis not present

## 2019-08-27 DIAGNOSIS — L819 Disorder of pigmentation, unspecified: Secondary | ICD-10-CM | POA: Diagnosis not present

## 2019-08-27 DIAGNOSIS — I8393 Asymptomatic varicose veins of bilateral lower extremities: Secondary | ICD-10-CM | POA: Diagnosis not present

## 2019-08-27 DIAGNOSIS — D229 Melanocytic nevi, unspecified: Secondary | ICD-10-CM | POA: Diagnosis not present

## 2019-08-27 DIAGNOSIS — L821 Other seborrheic keratosis: Secondary | ICD-10-CM | POA: Diagnosis not present

## 2019-08-27 DIAGNOSIS — L814 Other melanin hyperpigmentation: Secondary | ICD-10-CM | POA: Diagnosis not present

## 2019-08-27 DIAGNOSIS — Z85820 Personal history of malignant melanoma of skin: Secondary | ICD-10-CM | POA: Diagnosis not present

## 2019-09-14 ENCOUNTER — Other Ambulatory Visit: Payer: Self-pay

## 2019-09-14 ENCOUNTER — Ambulatory Visit (INDEPENDENT_AMBULATORY_CARE_PROVIDER_SITE_OTHER): Payer: Medicare HMO

## 2019-09-14 VITALS — BP 122/64 | Temp 97.7°F | Ht 68.0 in | Wt 203.0 lb

## 2019-09-14 DIAGNOSIS — Z0000 Encounter for general adult medical examination without abnormal findings: Secondary | ICD-10-CM

## 2019-09-14 NOTE — Patient Instructions (Signed)
Vincent Stevens , Thank you for taking time to come for your Medicare Wellness Visit. I appreciate your ongoing commitment to your health goals. Please review the following plan we discussed and let me know if I can assist you in the future.   Screening recommendations/referrals: Colorectal Screening: completed 06/06/17 with Dr. Watt Climes   Vision and Dental Exams: Recommended annual ophthalmology exams for early detection of glaucoma and other disorders of the eye Recommended annual dental exams for proper oral hygiene  Vaccinations: Influenza vaccine: completed 08/08/19 Pneumococcal vaccine: up to date; last 06/09/15 Tdap vaccine: recommended; Please call your insurance company to determine your out of pocket expense. You may also receive this vaccine at your local pharmacy or Health Dept. Shingles vaccine: Please call your insurance company to determine your out of pocket expense for the Shingrix vaccine. You may receive this vaccine at your local pharmacy.  Advanced directives: Advance directives discussed with you today. I have provided a copy for you to complete at home and have notarized. Once this is complete please bring a copy in to our office so we can scan it into your chart.  Goals: Recommend to drink at least 6-8 8oz glasses of water per day.  Next appointment: Please schedule your Annual Wellness Visit with your Nurse Health Advisor in one year.  Please schedule a follow up visit with Dr. Jerline Pain for February   Preventive Care 24 Years and Older, Male Preventive care refers to lifestyle choices and visits with your health care provider that can promote health and wellness. What does preventive care include?  A yearly physical exam. This is also called an annual well check.  Dental exams once or twice a year.  Routine eye exams. Ask your health care provider how often you should have your eyes checked.  Personal lifestyle choices, including:  Daily care of your teeth and  gums.  Regular physical activity.  Eating a healthy diet.  Avoiding tobacco and drug use.  Limiting alcohol use.  Practicing safe sex.  Taking low doses of aspirin every day if recommended by your health care provider..  Taking vitamin and mineral supplements as recommended by your health care provider. What happens during an annual well check? The services and screenings done by your health care provider during your annual well check will depend on your age, overall health, lifestyle risk factors, and family history of disease. Counseling  Your health care provider may ask you questions about your:  Alcohol use.  Tobacco use.  Drug use.  Emotional well-being.  Home and relationship well-being.  Sexual activity.  Eating habits.  History of falls.  Memory and ability to understand (cognition).  Work and work Statistician. Screening  You may have the following tests or measurements:  Height, weight, and BMI.  Blood pressure.  Lipid and cholesterol levels. These may be checked every 5 years, or more frequently if you are over 70 years old.  Skin check.  Lung cancer screening. You may have this screening every year starting at age 32 if you have a 30-pack-year history of smoking and currently smoke or have quit within the past 15 years.  Fecal occult blood test (FOBT) of the stool. You may have this test every year starting at age 64.  Flexible sigmoidoscopy or colonoscopy. You may have a sigmoidoscopy every 5 years or a colonoscopy every 10 years starting at age 72.  Prostate cancer screening. Recommendations will vary depending on your family history and other risks.  Hepatitis C blood  test.  Hepatitis B blood test.  Sexually transmitted disease (STD) testing.  Diabetes screening. This is done by checking your blood sugar (glucose) after you have not eaten for a while (fasting). You may have this done every 1-3 years.  Abdominal aortic aneurysm (AAA)  screening. You may need this if you are a current or former smoker.  Osteoporosis. You may be screened starting at age 77 if you are at high risk. Talk with your health care provider about your test results, treatment options, and if necessary, the need for more tests. Vaccines  Your health care provider may recommend certain vaccines, such as:  Influenza vaccine. This is recommended every year.  Tetanus, diphtheria, and acellular pertussis (Tdap, Td) vaccine. You may need a Td booster every 10 years.  Zoster vaccine. You may need this after age 79.  Pneumococcal 13-valent conjugate (PCV13) vaccine. One dose is recommended after age 77.  Pneumococcal polysaccharide (PPSV23) vaccine. One dose is recommended after age 87. Talk to your health care provider about which screenings and vaccines you need and how often you need them. This information is not intended to replace advice given to you by your health care provider. Make sure you discuss any questions you have with your health care provider. Document Released: 12/12/2015 Document Revised: 08/04/2016 Document Reviewed: 09/16/2015 Elsevier Interactive Patient Education  2017 [place] Prevention in the Home Falls can cause injuries. They can happen to people of all ages. There are many things you can do to make your home safe and to help prevent falls. What can I do on the outside of my home?  Regularly fix the edges of walkways and driveways and fix any cracks.  Remove anything that might make you trip as you walk through a door, such as a raised step or threshold.  Trim any bushes or trees on the path to your home.  Use bright outdoor lighting.  Clear any walking paths of anything that might make someone trip, such as rocks or tools.  Regularly check to see if handrails are loose or broken. Make sure that both sides of any steps have handrails.  Any raised decks and porches should have guardrails on the  edges.  Have any leaves, snow, or ice cleared regularly.  Use sand or salt on walking paths during winter.  Clean up any spills in your garage right away. This includes oil or grease spills. What can I do in the bathroom?  Use night lights.  Install grab bars by the toilet and in the tub and shower. Do not use towel bars as grab bars.  Use non-skid mats or decals in the tub or shower.  If you need to sit down in the shower, use a plastic, non-slip stool.  Keep the floor dry. Clean up any water that spills on the floor as soon as it happens.  Remove soap buildup in the tub or shower regularly.  Attach bath mats securely with double-sided non-slip rug tape.  Do not have throw rugs and other things on the floor that can make you trip. What can I do in the bedroom?  Use night lights.  Make sure that you have a light by your bed that is easy to reach.  Do not use any sheets or blankets that are too big for your bed. They should not hang down onto the floor.  Have a firm chair that has side arms. You can use this for support while you get dressed.  Do not have throw rugs and other things on the floor that can make you trip. What can I do in the kitchen?  Clean up any spills right away.  Avoid walking on wet floors.  Keep items that you use a lot in easy-to-reach places.  If you need to reach something above you, use a strong step stool that has a grab bar.  Keep electrical cords out of the way.  Do not use floor polish or wax that makes floors slippery. If you must use wax, use non-skid floor wax.  Do not have throw rugs and other things on the floor that can make you trip. What can I do with my stairs?  Do not leave any items on the stairs.  Make sure that there are handrails on both sides of the stairs and use them. Fix handrails that are broken or loose. Make sure that handrails are as long as the stairways.  Check any carpeting to make sure that it is firmly  attached to the stairs. Fix any carpet that is loose or worn.  Avoid having throw rugs at the top or bottom of the stairs. If you do have throw rugs, attach them to the floor with carpet tape.  Make sure that you have a light switch at the top of the stairs and the bottom of the stairs. If you do not have them, ask someone to add them for you. What else can I do to help prevent falls?  Wear shoes that:  Do not have high heels.  Have rubber bottoms.  Are comfortable and fit you well.  Are closed at the toe. Do not wear sandals.  If you use a stepladder:  Make sure that it is fully opened. Do not climb a closed stepladder.  Make sure that both sides of the stepladder are locked into place.  Ask someone to hold it for you, if possible.  Clearly mark and make sure that you can see:  Any grab bars or handrails.  First and last steps.  Where the edge of each step is.  Use tools that help you move around (mobility aids) if they are needed. These include:  Canes.  Walkers.  Scooters.  Crutches.  Turn on the lights when you go into a dark area. Replace any light bulbs as soon as they burn out.  Set up your furniture so you have a clear path. Avoid moving your furniture around.  If any of your floors are uneven, fix them.  If there are any pets around you, be aware of where they are.  Review your medicines with your doctor. Some medicines can make you feel dizzy. This can increase your chance of falling. Ask your doctor what other things that you can do to help prevent falls. This information is not intended to replace advice given to you by your health care provider. Make sure you discuss any questions you have with your health care provider. Document Released: 09/11/2009 Document Revised: 04/22/2016 Document Reviewed: 12/20/2014 Elsevier Interactive Patient Education  2017 [employer].

## 2019-09-14 NOTE — Progress Notes (Signed)
I have personally reviewed the Medicare Annual Wellness Visit and agree with the assessment and plan.  Algis Greenhouse. Jerline Pain, MD 09/14/2019 9:06 AM

## 2019-09-14 NOTE — Progress Notes (Signed)
Subjective:   Vincent Stevens is a 73 y.o. male who presents for Medicare Annual/Subsequent preventive examination.  Review of Systems:   Cardiac Risk Factors include: advanced age (>59men, >70 women);dyslipidemia;male gender     Objective:    Vitals: BP 122/64 (BP Location: Left Arm, Patient Position: Sitting, Cuff Size: Normal)   Temp 97.7 F (36.5 C)   Ht 5\' 8"  (1.727 m)   Wt 203 lb (92.1 kg)   BMI 30.87 kg/m   Body mass index is 30.87 kg/m.  Advanced Directives 09/14/2019 03/01/2018 03/01/2018  Does Patient Have a Medical Advance Directive? No No No  Would patient like information on creating a medical advance directive? Yes (MAU/Ambulatory/Procedural Areas - Information given) No - Patient declined No - Patient declined    Tobacco Social History   Tobacco Use  Smoking Status Former Smoker  . Quit date: 11/29/1980  . Years since quitting: 38.8  Smokeless Tobacco Never Used     Counseling given: Not Answered   Clinical Intake:  Pre-visit preparation completed: Yes  Pain : No/denies pain  Diabetes: No(last A1C was 6.6 on 07/09/19)  How often do you need to have someone help you when you read instructions, pamphlets, or other written materials from your doctor or pharmacy?: 1 - Never  Interpreter Needed?: No  Information entered by :: Denman George LPN  Past Medical History:  Diagnosis Date  . Allergy   . Benign prostatic hypertrophy   . BENIGN PROSTATIC HYPERTROPHY 09/19/2009   Qualifier: Diagnosis of  By: Burnice Logan  MD, Doretha Sou   . Chest pain 02/23/2018  . Dyslipidemia 08/14/2010   Qualifier: Diagnosis of  By: Burnice Logan  MD, Doretha Sou   . Essential hypertension 01/17/2009   Qualifier: Diagnosis of  By: Burnice Logan  MD, Doretha Sou   . Gout   . GOUT, ACUTE 11/07/2009   Qualifier: Diagnosis of  By: Burnice Logan  MD, Doretha Sou   . History of colonic polyps 09/27/2008   Qualifier: Diagnosis of  By: Burnice Logan  MD, Doretha Sou  Initial colonoscopy 2003.   Hyperplastic polyps only Followup colonoscopy 2008.  Normal  10 year interval recommended   . Hyperlipidemia   . Hypertension   . Impaired glucose tolerance 02/20/2013  . Left main coronary artery disease   . Melanoma ([place])   . SOB (shortness of breath) 02/23/2018   Past Surgical History:  Procedure Laterality Date  . CORONARY ARTERY BYPASS GRAFT N/A 03/01/2018   Procedure: CORONARY ARTERY BYPASS GRAFTING (CABG) times three using the right saphaneous vein. Harvested endoscopicly; and left internal mammary artery.;  Surgeon: Gaye Pollack, MD;  Location: [facility];  Service: Open Heart Surgery;  Laterality: N/A;  . HERNIA REPAIR    . LEFT HEART CATH AND CORONARY ANGIOGRAPHY N/A 03/01/2018   Procedure: LEFT HEART CATH AND CORONARY ANGIOGRAPHY;  Surgeon: Leonie Man, MD;  Location: [facility];  Service: Cardiovascular;  Laterality: N/A;  . melanoma removal    . TEE WITHOUT CARDIOVERSION N/A 03/01/2018   Procedure: TRANSESOPHAGEAL ECHOCARDIOGRAM (TEE);  Surgeon: Gaye Pollack, MD;  Location: [facility];  Service: Open Heart Surgery;  Laterality: N/A;   Family History  Problem Relation Age of Onset  . Diabetes Other   . Hypertension Other   . Heart attack Father   . Dementia Mother   . Cancer Sister        Skin   Social History   Socioeconomic History  . Marital status: Married  Spouse name: Not on file  . Number of children: 1  . Years of education: Not on file  . Highest education level: Not on file  Occupational History  . Occupation: Courier     Comment: Part time   Social Needs  . Financial resource strain: Not on file  . Food insecurity    Worry: Not on file    Inability: Not on file  . Transportation needs    Medical: Not on file    Non-medical: Not on file  Tobacco Use  . Smoking status: Former Smoker    Quit date: 11/29/1980    Years since quitting: 38.8  . Smokeless tobacco: Never Used  Substance and Sexual Activity  . Alcohol use: No  . Drug use: No  .  Sexual activity: Not on file  Lifestyle  . Physical activity    Days per week: Not on file    Minutes per session: Not on file  . Stress: Not on file  Relationships  . Social Herbalist on phone: Not on file    Gets together: Not on file    Attends religious service: Not on file    Active member of club or organization: Not on file    Attends meetings of clubs or organizations: Not on file    Relationship status: Not on file  Other Topics Concern  . Not on file  Social History Narrative  . Not on file    Outpatient Encounter Medications as of 09/14/2019  Medication Sig  . aspirin EC 81 MG tablet Take 1 tablet (81 mg total) by mouth daily.  Marland Kitchen ibuprofen (ADVIL,MOTRIN) 200 MG tablet Take 200 mg by mouth 2 (two) times daily as needed for mild pain or cramping.  . Multiple Vitamin (MULTIVITAMIN) tablet Take 1 tablet by mouth daily.  . simvastatin (ZOCOR) 80 MG tablet Take 1 tablet (80 mg total) by mouth at bedtime.  . tamsulosin (FLOMAX) 0.4 MG CAPS capsule TAKE 1 CAPSULE (0.4 MG TOTAL) BY MOUTH DAILY.   No facility-administered encounter medications on file as of 09/14/2019.     Activities of Daily Living In your present state of health, do you have any difficulty performing the following activities: 09/14/2019  Hearing? N  Vision? N  Difficulty concentrating or making decisions? N  Walking or climbing stairs? N  Dressing or bathing? N  Doing errands, shopping? N  Preparing Food and eating ? N  Using the Toilet? N  In the past six months, have you accidently leaked urine? N  Do you have problems with loss of bowel control? N  Managing your Medications? N  Managing your Finances? N  Housekeeping or managing your Housekeeping? N  Some recent data might be hidden    Patient Care Team: Vivi Barrack, MD as PCP - General (Family Medicine)   Assessment:   This is a routine wellness examination for [name].  Exercise Activities and Dietary recommendations  Current Exercise Habits: The patient has a physically strenuous job, but has no regular exercise apart from work.  Goals    . DIET - DECREASE SODA OR JUICE INTAKE     Currently drinking a lot of diet Pepsi and Mt Dew        Fall Risk Fall Risk  09/14/2019 06/21/2016 06/09/2015  Falls in the past year? 0 No No  Injury with Fall? 0 - -  Follow up Falls evaluation completed;Education provided;Falls prevention discussed - -   Is the patient's  home free of loose throw rugs in walkways, pet beds, electrical cords, etc?   yes      Grab bars in the bathroom? yes      Handrails on the stairs?   yes      Adequate lighting?   yes  Timed Get Up and Go Performed: completed and within normal timeframe; no gait abnormalities noted   Depression Screen PHQ 2/9 Scores 09/14/2019 09/11/2018 06/21/2018 06/21/2016  PHQ - 2 Score 0 0 0 0    Cognitive Function-no cognitive concerns at this time    6CIT Screen 09/14/2019  What Year? 0 points  What month? 0 points  What time? 0 points  Count back from 20 0 points  Months in reverse 0 points  Repeat phrase 0 points  Total Score 0    Immunization History  Administered Date(s) Administered  . Fluad Quad(high Dose 65+) 08/08/2019  . Influenza Whole 08/12/2009  . Influenza, High Dose Seasonal PF 09/01/2013, 09/16/2014, 09/01/2016, 08/28/2017  . Influenza,inj,Quad PF,6+ Mos 08/15/2018  . Influenza-Unspecified 09/01/2016, 08/28/2017  . Pneumococcal Conjugate-13 04/15/2014  . Pneumococcal Polysaccharide-23 04/15/2009, 06/09/2015  . Tetanus 04/15/2014    Qualifies for Shingles Vaccine? Discussed and patient will check with pharmacy for coverage.  Patient education handout provided   Screening Tests Health Maintenance  Topic Date Due  . INFLUENZA VACCINE  06/30/2019  . TETANUS/TDAP  04/15/2024  . COLONOSCOPY  06/07/2027  . Hepatitis C Screening  Completed  . PNA vac Low Risk Adult  Completed   Cancer Screenings: Lung: Low Dose CT Chest  recommended if Age 62-80 years, 30 pack-year currently smoking OR have quit w/in 15years. Patient does not qualify. Colorectal: colonoscopy 06/06/17 with Dr. Watt Climes    Plan:  I have personally reviewed and addressed the Medicare Annual Wellness questionnaire and have noted the following in the patient's chart:  A. Medical and social history B. Use of alcohol, tobacco or illicit drugs  C. Current medications and supplements D. Functional ability and status E.  Nutritional status F.  Physical activity G. Advance directives H. List of other physicians I.  Hospitalizations, surgeries, and ER visits in previous 12 months J.  [place] such as hearing and vision if needed, cognitive and depression L. Referrals, records requested, and appointments- none   In addition, I have reviewed and discussed with patient certain preventive protocols, quality metrics, and best practice recommendations. A written personalized care plan for preventive services as well as general preventive health recommendations were provided to patient.   Signed,  Denman George, LPN  Nurse Health Advisor   Nurse Notes: no additional

## 2019-12-03 ENCOUNTER — Other Ambulatory Visit: Payer: Self-pay

## 2019-12-04 ENCOUNTER — Encounter: Payer: Self-pay | Admitting: Family Medicine

## 2019-12-04 ENCOUNTER — Ambulatory Visit (INDEPENDENT_AMBULATORY_CARE_PROVIDER_SITE_OTHER): Payer: Medicare HMO | Admitting: Family Medicine

## 2019-12-04 ENCOUNTER — Telehealth: Payer: Self-pay | Admitting: Family Medicine

## 2019-12-04 VITALS — BP 148/80 | HR 59 | Temp 97.7°F | Ht 68.0 in | Wt 204.2 lb

## 2019-12-04 DIAGNOSIS — I25119 Atherosclerotic heart disease of native coronary artery with unspecified angina pectoris: Secondary | ICD-10-CM

## 2019-12-04 DIAGNOSIS — R739 Hyperglycemia, unspecified: Secondary | ICD-10-CM | POA: Diagnosis not present

## 2019-12-04 DIAGNOSIS — C439 Malignant melanoma of skin, unspecified: Secondary | ICD-10-CM

## 2019-12-04 DIAGNOSIS — Z0001 Encounter for general adult medical examination with abnormal findings: Secondary | ICD-10-CM

## 2019-12-04 DIAGNOSIS — N400 Enlarged prostate without lower urinary tract symptoms: Secondary | ICD-10-CM

## 2019-12-04 DIAGNOSIS — E785 Hyperlipidemia, unspecified: Secondary | ICD-10-CM

## 2019-12-04 LAB — COMPREHENSIVE METABOLIC PANEL
ALT: 24 U/L (ref 0–53)
AST: 27 U/L (ref 0–37)
Albumin: 4.5 g/dL (ref 3.5–5.2)
Alkaline Phosphatase: 55 U/L (ref 39–117)
BUN: 20 mg/dL (ref 6–23)
CO2: 28 mEq/L (ref 19–32)
Calcium: 9.4 mg/dL (ref 8.4–10.5)
Chloride: 103 mEq/L (ref 96–112)
Creatinine, Ser: 1.02 mg/dL (ref 0.40–1.50)
GFR: 71.23 mL/min (ref >60.00)
Glucose, Bld: 114 mg/dL — ABNORMAL HIGH (ref 70–99)
Potassium: 4.6 mEq/L (ref 3.5–5.1)
Sodium: 139 mEq/L (ref 135–145)
Total Bilirubin: 0.6 mg/dL (ref 0.2–1.2)
Total Protein: 6.7 g/dL (ref 6.0–8.3)

## 2019-12-04 LAB — CBC
HCT: 49.7 % (ref 39.0–52.0)
Hemoglobin: 16.4 g/dL (ref 13.0–17.0)
MCHC: 33.1 g/dL (ref 30.0–36.0)
MCV: 89.3 fl (ref 78.0–100.0)
Platelets: 172 10*3/uL (ref 150.0–400.0)
RBC: 5.56 Mil/uL (ref 4.22–5.81)
RDW: 14.1 % (ref 11.5–15.5)
WBC: 5.4 10*3/uL (ref 4.0–10.5)

## 2019-12-04 LAB — LIPID PANEL
Cholesterol: 145 mg/dL (ref 0–200)
HDL: 50.9 mg/dL (ref >39.00)
LDL Cholesterol: 80 mg/dL (ref 0–99)
NonHDL: 93.61
Total CHOL/HDL Ratio: 3
Triglycerides: 69 mg/dL (ref 0.0–149.0)
VLDL: 13.8 mg/dL (ref 0.0–40.0)

## 2019-12-04 LAB — HEMOGLOBIN A1C: Hgb A1c MFr Bld: 6.1 % (ref 4.6–6.5)

## 2019-12-04 LAB — TSH: TSH: 3.07 u[IU]/mL (ref 0.35–4.50)

## 2019-12-04 NOTE — Telephone Encounter (Signed)
Pt called returning missed call.

## 2019-12-04 NOTE — Progress Notes (Signed)
3

## 2019-12-04 NOTE — Patient Instructions (Signed)
It was very nice to see you today!  Keep up the good work!  We will check blood work today.  Come back in 1 year for your next check up, or sooner if needed.  Take care, Dr Jerline Pain  Please try these tips to maintain a healthy lifestyle:   Eat at least 3 REAL meals and 1-2 snacks per day.  Aim for no more than 5 hours between eating.  If you eat breakfast, please do so within one hour of getting up.    Each meal should contain half fruits/vegetables, one quarter protein, and one quarter carbs (no bigger than a computer mouse)   Cut down on sweet beverages. This includes juice, soda, and sweet tea.     Drink at least 1 glass of water with each meal and aim for at least 8 glasses per day   Exercise at least 150 minutes every week.    Preventive Care 75 Years and Older, Male Preventive care refers to lifestyle choices and visits with your health care provider that can promote health and wellness. This includes:  A yearly physical exam. This is also called an annual well check.  Regular dental and eye exams.  Immunizations.  Screening for certain conditions.  Healthy lifestyle choices, such as diet and exercise. What can I expect for my preventive care visit? Physical exam Your health care provider will check:  Height and weight. These may be used to calculate body mass index (BMI), which is a measurement that tells if you are at a healthy weight.  Heart rate and blood pressure.  Your skin for abnormal spots. Counseling Your health care provider may ask you questions about:  Alcohol, tobacco, and drug use.  Emotional well-being.  Home and relationship well-being.  Sexual activity.  Eating habits.  History of falls.  Memory and ability to understand (cognition).  Work and work Statistician. What immunizations do I need?  Influenza (flu) vaccine  This is recommended every year. Tetanus, diphtheria, and pertussis (Tdap) vaccine  You may need a Td  booster every 10 years. Varicella (chickenpox) vaccine  You may need this vaccine if you have not already been vaccinated. Zoster (shingles) vaccine  You may need this after age 41. Pneumococcal conjugate (PCV13) vaccine  One dose is recommended after age 74. Pneumococcal polysaccharide (PPSV23) vaccine  One dose is recommended after age 51. Measles, mumps, and rubella (MMR) vaccine  You may need at least one dose of MMR if you were born in 1957 or later. You may also need a second dose. Meningococcal conjugate (MenACWY) vaccine  You may need this if you have certain conditions. Hepatitis A vaccine  You may need this if you have certain conditions or if you travel or work in places where you may be exposed to hepatitis A. Hepatitis B vaccine  You may need this if you have certain conditions or if you travel or work in places where you may be exposed to hepatitis B. Haemophilus influenzae type b (Hib) vaccine  You may need this if you have certain conditions. You may receive vaccines as individual doses or as more than one vaccine together in one shot (combination vaccines). Talk with your health care provider about the risks and benefits of combination vaccines. What tests do I need? Blood tests  Lipid and cholesterol levels. These may be checked every 5 years, or more frequently depending on your overall health.  Hepatitis C test.  Hepatitis B test. Screening  Lung cancer screening. You  may have this screening every year starting at age 68 if you have a 30-pack-year history of smoking and currently smoke or have quit within the past 15 years.  Colorectal cancer screening. All adults should have this screening starting at age 31 and continuing until age 78. Your health care provider may recommend screening at age 73 if you are at increased risk. You will have tests every 1-10 years, depending on your results and the type of screening test.  Prostate cancer screening.  Recommendations will vary depending on your family history and other risks.  Diabetes screening. This is done by checking your blood sugar (glucose) after you have not eaten for a while (fasting). You may have this done every 1-3 years.  Abdominal aortic aneurysm (AAA) screening. You may need this if you are a current or former smoker.  Sexually transmitted disease (STD) testing. Follow these instructions at home: Eating and drinking  Eat a diet that includes fresh fruits and vegetables, whole grains, lean protein, and low-fat dairy products. Limit your intake of foods with high amounts of sugar, saturated fats, and salt.  Take vitamin and mineral supplements as recommended by your health care provider.  Do not drink alcohol if your health care provider tells you not to drink.  If you drink alcohol: ? Limit how much you have to 0-2 drinks a day. ? Be aware of how much alcohol is in your drink. In the [place] one drink equals one 12 oz bottle of beer (355 mL), one 5 oz glass of wine (148 mL), or one 1 oz glass of hard liquor (44 mL). Lifestyle  Take daily care of your teeth and gums.  Stay active. Exercise for at least 30 minutes on 5 or more days each week.  Do not use any products that contain nicotine or tobacco, such as cigarettes, e-cigarettes, and chewing tobacco. If you need help quitting, ask your health care provider.  If you are sexually active, practice safe sex. Use a condom or other form of protection to prevent STIs (sexually transmitted infections).  Talk with your health care provider about taking a low-dose aspirin or statin. What's next?  Visit your health care provider once a year for a well check visit.  Ask your health care provider how often you should have your eyes and teeth checked.  Stay up to date on all vaccines. This information is not intended to replace advice given to you by your health care provider. Make sure you discuss any questions you have with  your health care provider. Document Revised: 11/09/2018 Document Reviewed: 11/09/2018 Elsevier Patient Education  2020 [employer].

## 2019-12-04 NOTE — Progress Notes (Signed)
Chief Complaint:  Vincent Stevens is a 74 y.o. male who presents today for his annual comprehensive physical exam.    Assessment/Plan:  # Dyslipidemia / CAD s/p CABG - Follows with cardiology - On simvastatin 80mg  daily and tolerating well - ROS: No reported side myalgias - A/P. Stable. Continue current treatment plan. Check lipids, CBC, CMET, and TSH.   # BPH - On flomax 0.4mg  daily and tolerating well - A/P. Stable. Continue current treatment plan.   # Melanoma - Follows with dermatology - A/P. Stable. Continue current treatment plan.   Preventative Healthcare: UTD on vaccines. Due for colonoscopy in 2028.   Patient Counseling(The following topics were reviewed and/or handout was given):  -Nutrition: Stressed importance of moderation in sodium/caffeine intake, saturated fat and cholesterol, caloric balance, sufficient intake of fresh fruits, vegetables, and fiber.  -Stressed the importance of regular exercise.   -Substance Abuse: Discussed cessation/primary prevention of tobacco, alcohol, or other drug use; driving or other dangerous activities under the influence; availability of treatment for abuse.   -Injury prevention: Discussed safety belts, safety helmets, smoke detector, smoking near bedding or upholstery.   -Sexuality: Discussed sexually transmitted diseases, partner selection, use of condoms, avoidance of unintended pregnancy and contraceptive alternatives.   -Dental health: Discussed importance of regular tooth brushing, flossing, and dental visits.  -Health maintenance and immunizations reviewed. Please refer to Health maintenance section.  Return to care in 1 year for next preventative visit.     Subjective:  HPI:  He has no acute complaints today.   Lifestyle Diet: None specific.  Exercise: Limited due to covid. Likes to go walking  Depression screen [facility] 2/9 12/04/2019  Decreased Interest 0  Down, Depressed, Hopeless 0  PHQ - 2 Score 0   PMH:  The  following were reviewed and entered/updated in epic: Past Medical History:  Diagnosis Date  . Allergy   . Benign prostatic hypertrophy   . BENIGN PROSTATIC HYPERTROPHY 09/19/2009   Qualifier: Diagnosis of  By: Burnice Logan  MD, Doretha Sou   . Chest pain 02/23/2018  . Dyslipidemia 08/14/2010   Qualifier: Diagnosis of  By: Burnice Logan  MD, Doretha Sou   . Essential hypertension 01/17/2009   Qualifier: Diagnosis of  By: Burnice Logan  MD, Doretha Sou   . Gout   . GOUT, ACUTE 11/07/2009   Qualifier: Diagnosis of  By: Burnice Logan  MD, Doretha Sou   . History of colonic polyps 09/27/2008   Qualifier: Diagnosis of  By: Burnice Logan  MD, Doretha Sou  Initial colonoscopy 2003.  Hyperplastic polyps only Followup colonoscopy 2008.  Normal  10 year interval recommended   . Hyperlipidemia   . Hypertension   . Impaired glucose tolerance 02/20/2013  . Left main coronary artery disease   . Melanoma ([place])   . SOB (shortness of breath) 02/23/2018   Patient Active Problem List   Diagnosis Date Noted  . Pain in both lower extremities 08/15/2018  . Gout   . Melanoma ([place])   . Angina, class III ([place]) 03/01/2018  . Hx of CABG 03/01/2018  . Coronary artery disease involving native coronary artery of native heart with angina pectoris ([place])   . Impaired glucose tolerance 02/20/2013  . Dyslipidemia 08/14/2010  . BPH (benign prostatic hyperplasia) 09/19/2009  . Essential hypertension 01/17/2009  . History of colonic polyps 09/27/2008   Past Surgical History:  Procedure Laterality Date  . CORONARY ARTERY BYPASS GRAFT N/A 03/01/2018   Procedure: CORONARY ARTERY BYPASS GRAFTING (CABG) times three using the right  saphaneous vein. Harvested endoscopicly; and left internal mammary artery.;  Surgeon: Gaye Pollack, MD;  Location: [facility];  Service: Open Heart Surgery;  Laterality: N/A;  . HERNIA REPAIR    . LEFT HEART CATH AND CORONARY ANGIOGRAPHY N/A 03/01/2018   Procedure: LEFT HEART CATH AND CORONARY ANGIOGRAPHY;  Surgeon: Leonie Man, MD;  Location: [facility];  Service: Cardiovascular;  Laterality: N/A;  . melanoma removal    . TEE WITHOUT CARDIOVERSION N/A 03/01/2018   Procedure: TRANSESOPHAGEAL ECHOCARDIOGRAM (TEE);  Surgeon: Gaye Pollack, MD;  Location: [facility];  Service: Open Heart Surgery;  Laterality: N/A;    Family History  Problem Relation Age of Onset  . Diabetes Other   . Hypertension Other   . Heart attack Father   . Dementia Mother   . Cancer Sister        Skin    Medications- reviewed and updated Current Outpatient Medications  Medication Sig Dispense Refill  . aspirin EC 81 MG tablet Take 1 tablet (81 mg total) by mouth daily.    Marland Kitchen ibuprofen (ADVIL,MOTRIN) 200 MG tablet Take 200 mg by mouth 2 (two) times daily as needed for mild pain or cramping.    . Multiple Vitamin (MULTIVITAMIN) tablet Take 1 tablet by mouth daily. 90 tablet 3  . simvastatin (ZOCOR) 80 MG tablet Take 1 tablet (80 mg total) by mouth at bedtime. 90 tablet 2  . tamsulosin (FLOMAX) 0.4 MG CAPS capsule TAKE 1 CAPSULE (0.4 MG TOTAL) BY MOUTH DAILY. 90 capsule 1   No current facility-administered medications for this visit.    Allergies-reviewed and updated Allergies  Allergen Reactions  . Sulfamethoxazole Hives and Itching    Social History   Socioeconomic History  . Marital status: Married    Spouse name: Not on file  . Number of children: 1  . Years of education: Not on file  . Highest education level: Not on file  Occupational History  . Occupation: Courier     Comment: Part time   Tobacco Use  . Smoking status: Former Smoker    Quit date: 11/29/1980    Years since quitting: 39.0  . Smokeless tobacco: Never Used  Substance and Sexual Activity  . Alcohol use: No  . Drug use: No  . Sexual activity: Not on file  Other Topics Concern  . Not on file  Social History Narrative  . Not on file   Social Determinants of Health   Financial Resource Strain:   . Difficulty of Paying Living Expenses: Not  on file  Food Insecurity:   . Worried About Charity fundraiser in the Last Year: Not on file  . Ran Out of Food in the Last Year: Not on file  Transportation Needs:   . Lack of Transportation (Medical): Not on file  . Lack of Transportation (Non-Medical): Not on file  Physical Activity:   . Days of Exercise per Week: Not on file  . Minutes of Exercise per Session: Not on file  Stress:   . Feeling of Stress : Not on file  Social Connections:   . Frequency of Communication with Friends and Family: Not on file  . Frequency of Social Gatherings with Friends and Family: Not on file  . Attends Religious Services: Not on file  . Active Member of Clubs or Organizations: Not on file  . Attends Archivist Meetings: Not on file  . Marital Status: Not on file  Objective:  Physical Exam: BP (!) 148/80   Pulse (!) 59   Temp 97.7 F (36.5 C)   Ht 5\' 8"  (1.727 m)   Wt 204 lb 4 oz (92.6 kg)   SpO2 96%   BMI 31.06 kg/m   Body mass index is 31.06 kg/m. Wt Readings from Last 3 Encounters:  12/04/19 204 lb 4 oz (92.6 kg)  09/14/19 203 lb (92.1 kg)  05/24/19 200 lb 1.9 oz (90.8 kg)   Gen: NAD, resting comfortably HEENT: TMs normal bilaterally. OP clear. No thyromegaly noted.  CV: RRR with no murmurs appreciated Pulm: NWOB, CTAB with no crackles, wheezes, or rhonchi GI: Normal bowel sounds present. Soft, Nontender, Nondistended. MSK: no edema, cyanosis, or clubbing noted Skin: warm, dry Neuro: CN2-12 grossly intact. Strength 5/5 in upper and lower extremities. Reflexes symmetric and intact bilaterally.  Psych: Normal affect and thought content     Comfort Iversen M. Jerline Pain, MD 12/04/2019 8:29 AM

## 2019-12-04 NOTE — Progress Notes (Signed)
Please inform patient of the following:  Blood sugar is borderline diabetic. Cholesterol is better. Everything else is normal. Do not need to start any additional meds but recommend he continue current meds and work on his diet/exercise and we can recheck in a year or so.

## 2019-12-05 ENCOUNTER — Other Ambulatory Visit: Payer: Self-pay | Admitting: Family Medicine

## 2019-12-05 NOTE — Telephone Encounter (Signed)
Patient notified of lab results,voices understanding

## 2019-12-06 NOTE — Telephone Encounter (Signed)
Last OV 12/04/19 Last refill 06/07/19 #90/1 Next OV 12/04/20

## 2020-02-02 ENCOUNTER — Ambulatory Visit: Payer: Medicare HMO | Attending: Internal Medicine

## 2020-02-02 DIAGNOSIS — Z23 Encounter for immunization: Secondary | ICD-10-CM | POA: Insufficient documentation

## 2020-02-02 NOTE — Progress Notes (Signed)
   Covid-19 Vaccination [facility]  Name:  Vincent Stevens    MRN: [id number] DOB: 1946-06-29  02/02/2020  [name] was observed post Covid-19 immunization for 15 minutes without incident. He was provided with Vaccine Information Sheet and instruction to access the V-Safe system.   [name] was instructed to call 911 with any severe reactions post vaccine: Marland Kitchen Difficulty breathing  . Swelling of face and throat  . A fast heartbeat  . A bad rash all over body  . Dizziness and weakness   Immunizations Administered    Name Date Dose VIS Date Route   Pfizer COVID-19 Vaccine 02/02/2020  1:32 PM 0.3 mL 11/09/2019 Intramuscular   Manufacturer: [place]   Lot: [id number]   [place]: [id number]

## 2020-02-11 ENCOUNTER — Other Ambulatory Visit: Payer: Self-pay | Admitting: Cardiology

## 2020-03-04 ENCOUNTER — Ambulatory Visit: Payer: Medicare HMO | Attending: Internal Medicine

## 2020-03-04 DIAGNOSIS — Z23 Encounter for immunization: Secondary | ICD-10-CM

## 2020-03-04 NOTE — Progress Notes (Signed)
   Covid-19 Vaccination Clinic  Name:  Vincent Stevens    MRN: [id number] DOB: 1946/04/13  03/04/2020  [name] was observed post Covid-19 immunization for 15 minutes without incident. He was provided with Vaccine Information Sheet and instruction to access the V-Safe system.   [name] was instructed to call 911 with any severe reactions post vaccine: Marland Kitchen Difficulty breathing  . Swelling of face and throat  . A fast heartbeat  . A bad rash all over body  . Dizziness and weakness   Immunizations Administered    Name Date Dose VIS Date Route   Pfizer COVID-19 Vaccine 03/04/2020  8:57 AM 0.3 mL 11/09/2019 Intramuscular   Manufacturer: [place]   Lot: [id number]   [place]: [id number]

## 2020-05-08 NOTE — Progress Notes (Signed)
Cardiology Office Note:    Date:  05/09/2020   [name], DOB 1946-09-13, MRN [id number]  PCP:  Vivi Barrack, MD  Cardiologist:  Shirlee More, MD    Referring MD: Vivi Barrack, MD    ASSESSMENT:    1. Coronary artery disease involving native coronary artery of native heart with angina pectoris ([place])   2. Hx of CABG   3. Essential hypertension   4. Dyslipidemia    PLAN:    In order of problems listed above:  1. Stable New [place] Heart Association class I, he is having no angina on current medical therapy after bypass surgery and continue aspirin and simvastatin. 2. BP at target does not require an antihypertensive agent 3. We will continue statin recheck liver function lipid profile in his case goal LDL less than 100 4. Is a relatively slow heart rate 54 bpm I told him in the future when physicians approach your prescriptions remind people about that especially for things like antihypertensive agents and eyedrops.   Next appointment: 1 year   Medication Adjustments/Labs and Tests Ordered: Current medicines are reviewed at length with the patient today.  Concerns regarding medicines are outlined above.  No orders of the defined types were placed in this encounter.  No orders of the defined types were placed in this encounter.   Chief Complaint  Patient presents with  . Follow-up  . Coronary Artery Disease  . Hypertension  . Hyperlipidemia    History of Present Illness:    Vincent Stevens is a 74 y.o. male with a hx of CAD and CABG, hyperlipidemia  last seen 05/19/2019. Compliance with diet, lifestyle and medications: Yes  He has had a good year he is not as active and [facility] has had [facility] and we have reopened.  He has had no angina dyspnea palpitation or syncope and his only real complaint is that he bruises easily he has some mild ecchymoses.  I asked him to add a multivitamin for skin integrity to his diet as he  takes aspirin.  At this time his lipids are at target LDL less than 100 he is having no angina and I would not advise an ischemia evaluation Past Medical History:  Diagnosis Date  . Allergy   . Benign prostatic hypertrophy   . BENIGN PROSTATIC HYPERTROPHY 09/19/2009   Qualifier: Diagnosis of  By: Burnice Logan  MD, Doretha Sou   . Chest pain 02/23/2018  . Dyslipidemia 08/14/2010   Qualifier: Diagnosis of  By: Burnice Logan  MD, Doretha Sou   . Essential hypertension 01/17/2009   Qualifier: Diagnosis of  By: Burnice Logan  MD, Doretha Sou   . Gout   . GOUT, ACUTE 11/07/2009   Qualifier: Diagnosis of  By: Burnice Logan  MD, Doretha Sou   . History of colonic polyps 09/27/2008   Qualifier: Diagnosis of  By: Burnice Logan  MD, Doretha Sou  Initial colonoscopy 2003.  Hyperplastic polyps only Followup colonoscopy 2008.  Normal  10 year interval recommended   . Hyperlipidemia   . Hypertension   . Impaired glucose tolerance 02/20/2013  . Left main coronary artery disease   . Melanoma ([place])   . SOB (shortness of breath) 02/23/2018    Past Surgical History:  Procedure Laterality Date  . CORONARY ARTERY BYPASS GRAFT N/A 03/01/2018   Procedure: CORONARY ARTERY BYPASS GRAFTING (CABG) times three using the right saphaneous vein. Harvested endoscopicly; and left internal mammary artery.;  Surgeon: Gaye Pollack, MD;  Location: [facility];  Service: Open Heart Surgery;  Laterality: N/A;  . HERNIA REPAIR    . LEFT HEART CATH AND CORONARY ANGIOGRAPHY N/A 03/01/2018   Procedure: LEFT HEART CATH AND CORONARY ANGIOGRAPHY;  Surgeon: Leonie Man, MD;  Location: [facility];  Service: Cardiovascular;  Laterality: N/A;  . melanoma removal    . TEE WITHOUT CARDIOVERSION N/A 03/01/2018   Procedure: TRANSESOPHAGEAL ECHOCARDIOGRAM (TEE);  Surgeon: Gaye Pollack, MD;  Location: [facility];  Service: Open Heart Surgery;  Laterality: N/A;    Current Medications: Current Meds  Medication Sig  . aspirin EC 81 MG tablet Take 1 tablet (81 mg  total) by mouth daily.  Marland Kitchen ibuprofen (ADVIL,MOTRIN) 200 MG tablet Take 200 mg by mouth 2 (two) times daily as needed for mild pain or cramping.  . Multiple Vitamin (MULTIVITAMIN) tablet Take 1 tablet by mouth daily.  . simvastatin (ZOCOR) 80 MG tablet TAKE 1 TABLET AT BEDTIME  . tamsulosin (FLOMAX) 0.4 MG CAPS capsule TAKE 1 CAPSULE (0.4 MG TOTAL) BY MOUTH DAILY.     Allergies:   Sulfamethoxazole   Social History   Socioeconomic History  . Marital status: Married    Spouse name: Not on file  . Number of children: 1  . Years of education: Not on file  . Highest education level: Not on file  Occupational History  . Occupation: Courier     Comment: Part time   Tobacco Use  . Smoking status: Former Smoker    Quit date: 11/29/1980    Years since quitting: 39.4  . Smokeless tobacco: Never Used  Vaping Use  . Vaping Use: Never used  Substance and Sexual Activity  . Alcohol use: No  . Drug use: No  . Sexual activity: Not on file  Other Topics Concern  . Not on file  Social History Narrative  . Not on file   Social Determinants of Health   Financial Resource Strain:   . Difficulty of Paying Living Expenses:   Food Insecurity:   . Worried About Charity fundraiser in the Last Year:   . Arboriculturist in the Last Year:   Transportation Needs:   . Film/video editor (Medical):   Marland Kitchen Lack of Transportation (Non-Medical):   Physical Activity:   . Days of Exercise per Week:   . Minutes of Exercise per Session:   Stress:   . Feeling of Stress :   Social Connections:   . Frequency of Communication with Friends and Family:   . Frequency of Social Gatherings with Friends and Family:   . Attends Religious Services:   . Active Member of Clubs or Organizations:   . Attends Archivist Meetings:   Marland Kitchen Marital Status:      Family History: The patient's family history includes Cancer in his sister; Dementia in his mother; Diabetes in an other family member; Heart attack in  his father; Hypertension in an other family member. ROS:   Please see the history of present illness.    All other systems reviewed and are negative.  EKGs/Labs/Other Studies Reviewed:    The following studies were reviewed today:  EKG:  EKG ordered today and personally reviewed.  The ekg ordered today demonstrates sinus rhythm and is normal  Recent Labs: 12/04/2019: ALT 24; BUN 20; Creatinine, Ser 1.02; Hemoglobin 16.4; Platelets 172.0; Potassium 4.6; Sodium 139; TSH 3.07  Recent Lipid Panel    Component Value  Date/Time   CHOL 145 12/04/2019 0826   CHOL 148 07/09/2019 0903   TRIG 69.0 12/04/2019 0826   HDL 50.90 12/04/2019 0826   HDL 49 07/09/2019 0903   CHOLHDL 3 12/04/2019 0826   VLDL 13.8 12/04/2019 0826   LDLCALC 80 12/04/2019 0826   LDLCALC 82 07/09/2019 0903    Physical Exam:    VS:  BP 124/64   Pulse (!) 54   Ht 5\' 8"  (1.727 m)   Wt 197 lb (89.4 kg)   SpO2 97%   BMI 29.95 kg/m     Wt Readings from Last 3 Encounters:  05/09/20 197 lb (89.4 kg)  12/04/19 204 lb 4 oz (92.6 kg)  09/14/19 203 lb (92.1 kg)     GEN:  Well nourished, well developed in no acute distress HEENT: Normal NECK: No JVD; No carotid bruits LYMPHATICS: No lymphadenopathy CARDIAC: RRR, no murmurs, rubs, gallops RESPIRATORY:  Clear to auscultation without rales, wheezing or rhonchi  ABDOMEN: Soft, non-tender, non-distended MUSCULOSKELETAL:  No edema; No deformity  SKIN: Warm and dry he has a few ecchymoses NEUROLOGIC:  Alert and oriented x 3 PSYCHIATRIC:  Normal affect    Signed, Shirlee More, MD  05/09/2020 8:35 AM    [place]

## 2020-05-09 ENCOUNTER — Other Ambulatory Visit: Payer: Self-pay

## 2020-05-09 ENCOUNTER — Ambulatory Visit: Payer: Medicare HMO | Admitting: Cardiology

## 2020-05-09 ENCOUNTER — Encounter: Payer: Self-pay | Admitting: Cardiology

## 2020-05-09 VITALS — BP 124/64 | HR 54 | Ht 68.0 in | Wt 197.0 lb

## 2020-05-09 DIAGNOSIS — E785 Hyperlipidemia, unspecified: Secondary | ICD-10-CM | POA: Diagnosis not present

## 2020-05-09 DIAGNOSIS — I10 Essential (primary) hypertension: Secondary | ICD-10-CM | POA: Diagnosis not present

## 2020-05-09 DIAGNOSIS — I25119 Atherosclerotic heart disease of native coronary artery with unspecified angina pectoris: Secondary | ICD-10-CM

## 2020-05-09 DIAGNOSIS — Z951 Presence of aortocoronary bypass graft: Secondary | ICD-10-CM

## 2020-05-09 NOTE — Patient Instructions (Signed)

## 2020-05-10 LAB — LIPID PANEL
Chol/HDL Ratio: 3 ratio (ref 0.0–5.0)
Cholesterol, Total: 138 mg/dL (ref 100–199)
HDL: 46 mg/dL (ref >39)
LDL Chol Calc (NIH): 77 mg/dL (ref 0–99)
Triglycerides: 74 mg/dL (ref 0–149)
VLDL Cholesterol Cal: 15 mg/dL (ref 5–40)

## 2020-05-10 LAB — COMPREHENSIVE METABOLIC PANEL
ALT: 16 IU/L (ref 0–44)
AST: 22 IU/L (ref 0–40)
Albumin/Globulin Ratio: 2 (ref 1.2–2.2)
Albumin: 4.5 g/dL (ref 3.7–4.7)
Alkaline Phosphatase: 64 IU/L (ref 48–121)
BUN/Creatinine Ratio: 16 (ref 10–24)
BUN: 16 mg/dL (ref 8–27)
Bilirubin Total: 0.5 mg/dL (ref 0.0–1.2)
CO2: 24 mmol/L (ref 20–29)
Calcium: 9.6 mg/dL (ref 8.6–10.2)
Chloride: 104 mmol/L (ref 96–106)
Creatinine, Ser: 1.02 mg/dL (ref 0.76–1.27)
GFR calc Af Amer: 83 mL/min/{1.73_m2} (ref >59)
GFR calc non Af Amer: 72 mL/min/{1.73_m2} (ref >59)
Globulin, Total: 2.2 g/dL (ref 1.5–4.5)
Glucose: 98 mg/dL (ref 65–99)
Potassium: 5.1 mmol/L (ref 3.5–5.2)
Sodium: 140 mmol/L (ref 134–144)
Total Protein: 6.7 g/dL (ref 6.0–8.5)

## 2020-05-12 ENCOUNTER — Other Ambulatory Visit: Payer: Self-pay | Admitting: Cardiology

## 2020-05-12 ENCOUNTER — Telehealth: Payer: Self-pay

## 2020-05-12 NOTE — Telephone Encounter (Signed)
Spoke with patient regarding results and recommendation.  Patient verbalizes understanding and is agreeable to plan of care. Advised patient to call back with any issues or concerns.  

## 2020-05-12 NOTE — Telephone Encounter (Signed)
-----   Message from Richardo Priest, MD sent at 05/10/2020 12:46 PM EDT ----- Normal or stable result  Lipids are at target no changes

## 2020-09-02 DIAGNOSIS — D229 Melanocytic nevi, unspecified: Secondary | ICD-10-CM | POA: Diagnosis not present

## 2020-09-02 DIAGNOSIS — L814 Other melanin hyperpigmentation: Secondary | ICD-10-CM | POA: Diagnosis not present

## 2020-09-02 DIAGNOSIS — L819 Disorder of pigmentation, unspecified: Secondary | ICD-10-CM | POA: Diagnosis not present

## 2020-09-02 DIAGNOSIS — D485 Neoplasm of uncertain behavior of skin: Secondary | ICD-10-CM | POA: Diagnosis not present

## 2020-09-02 DIAGNOSIS — L570 Actinic keratosis: Secondary | ICD-10-CM | POA: Diagnosis not present

## 2020-09-02 DIAGNOSIS — C44219 Basal cell carcinoma of skin of left ear and external auricular canal: Secondary | ICD-10-CM | POA: Diagnosis not present

## 2020-09-02 DIAGNOSIS — I8393 Asymptomatic varicose veins of bilateral lower extremities: Secondary | ICD-10-CM | POA: Diagnosis not present

## 2020-09-02 DIAGNOSIS — L821 Other seborrheic keratosis: Secondary | ICD-10-CM | POA: Diagnosis not present

## 2020-09-02 DIAGNOSIS — D1801 Hemangioma of skin and subcutaneous tissue: Secondary | ICD-10-CM | POA: Diagnosis not present

## 2020-09-02 DIAGNOSIS — L905 Scar conditions and fibrosis of skin: Secondary | ICD-10-CM | POA: Diagnosis not present

## 2020-09-16 DIAGNOSIS — C44219 Basal cell carcinoma of skin of left ear and external auricular canal: Secondary | ICD-10-CM | POA: Diagnosis not present

## 2020-09-18 ENCOUNTER — Other Ambulatory Visit: Payer: Self-pay | Admitting: Cardiology

## 2020-10-20 ENCOUNTER — Emergency Department (HOSPITAL_COMMUNITY): Payer: Medicare HMO

## 2020-10-20 ENCOUNTER — Ambulatory Visit: Payer: Medicare HMO

## 2020-10-20 ENCOUNTER — Other Ambulatory Visit: Payer: Self-pay

## 2020-10-20 ENCOUNTER — Emergency Department (HOSPITAL_COMMUNITY)
Admission: EM | Admit: 2020-10-20 | Discharge: 2020-10-20 | Disposition: A | Discharge status: Home / Self Care | Payer: Medicare HMO | Attending: Emergency Medicine | Admitting: Emergency Medicine

## 2020-10-20 ENCOUNTER — Encounter (HOSPITAL_COMMUNITY): Payer: Self-pay | Admitting: Emergency Medicine

## 2020-10-20 DIAGNOSIS — Z955 Presence of coronary angioplasty implant and graft: Secondary | ICD-10-CM | POA: Insufficient documentation

## 2020-10-20 DIAGNOSIS — Z0000 Encounter for general adult medical examination without abnormal findings: Secondary | ICD-10-CM

## 2020-10-20 DIAGNOSIS — I10 Essential (primary) hypertension: Secondary | ICD-10-CM | POA: Insufficient documentation

## 2020-10-20 DIAGNOSIS — R270 Ataxia, unspecified: Secondary | ICD-10-CM | POA: Diagnosis not present

## 2020-10-20 DIAGNOSIS — Y9301 Activity, walking, marching and hiking: Secondary | ICD-10-CM | POA: Insufficient documentation

## 2020-10-20 DIAGNOSIS — S0990XA Unspecified injury of head, initial encounter: Secondary | ICD-10-CM | POA: Diagnosis not present

## 2020-10-20 DIAGNOSIS — W228XXA Striking against or struck by other objects, initial encounter: Secondary | ICD-10-CM | POA: Diagnosis not present

## 2020-10-20 DIAGNOSIS — Z87891 Personal history of nicotine dependence: Secondary | ICD-10-CM | POA: Insufficient documentation

## 2020-10-20 DIAGNOSIS — H81399 Other peripheral vertigo, unspecified ear: Secondary | ICD-10-CM | POA: Diagnosis not present

## 2020-10-20 DIAGNOSIS — Y92003 Bedroom of unspecified non-institutional (private) residence as the place of occurrence of the external cause: Secondary | ICD-10-CM | POA: Insufficient documentation

## 2020-10-20 DIAGNOSIS — Z7982 Long term (current) use of aspirin: Secondary | ICD-10-CM | POA: Insufficient documentation

## 2020-10-20 DIAGNOSIS — I25110 Atherosclerotic heart disease of native coronary artery with unstable angina pectoris: Secondary | ICD-10-CM | POA: Diagnosis not present

## 2020-10-20 DIAGNOSIS — R9082 White matter disease, unspecified: Secondary | ICD-10-CM | POA: Diagnosis not present

## 2020-10-20 DIAGNOSIS — R42 Dizziness and giddiness: Secondary | ICD-10-CM | POA: Diagnosis present

## 2020-10-20 LAB — BASIC METABOLIC PANEL
Anion gap: 11 (ref 5–15)
BUN: 16 mg/dL (ref 8–23)
CO2: 23 mmol/L (ref 22–32)
Calcium: 9.2 mg/dL (ref 8.9–10.3)
Chloride: 107 mmol/L (ref 98–111)
Creatinine, Ser: 1.06 mg/dL (ref 0.61–1.24)
GFR, Estimated: >60 mL/min (ref >60)
Glucose, Bld: 140 mg/dL — ABNORMAL HIGH (ref 70–99)
Potassium: 4 mmol/L (ref 3.5–5.1)
Sodium: 141 mmol/L (ref 135–145)

## 2020-10-20 LAB — URINALYSIS, ROUTINE W REFLEX MICROSCOPIC
Bilirubin Urine: NEGATIVE
Glucose, UA: NEGATIVE mg/dL
Hgb urine dipstick: NEGATIVE
Ketones, ur: NEGATIVE mg/dL
Leukocytes,Ua: NEGATIVE
Nitrite: NEGATIVE
Protein, ur: NEGATIVE mg/dL
Specific Gravity, Urine: 1.02 (ref 1.005–1.030)
pH: 6 (ref 5.0–8.0)

## 2020-10-20 LAB — CBC
HCT: 52 % (ref 39.0–52.0)
Hemoglobin: 16.5 g/dL (ref 13.0–17.0)
MCH: 28.6 pg (ref 26.0–34.0)
MCHC: 31.7 g/dL (ref 30.0–36.0)
MCV: 90.3 fL (ref 80.0–100.0)
Platelets: 182 10*3/uL (ref 150–400)
RBC: 5.76 MIL/uL (ref 4.22–5.81)
RDW: 13.4 % (ref 11.5–15.5)
WBC: 6.7 10*3/uL (ref 4.0–10.5)
nRBC: 0 % (ref 0.0–0.2)

## 2020-10-20 MED ORDER — ONDANSETRON 4 MG PO TBDP: 4.0000 mg | ORAL_TABLET | Freq: Three times a day (TID) | ORAL | 0 refills | Status: DC | PRN

## 2020-10-20 MED ORDER — MECLIZINE HCL 25 MG PO TABS: 25.0000 mg | ORAL_TABLET | Freq: Three times a day (TID) | ORAL | 0 refills | Status: DC | PRN

## 2020-10-20 NOTE — ED Provider Notes (Addendum)
[facility] EMERGENCY DEPARTMENT Provider Note   CSN: [id number] Arrival date & time: 10/20/20  4742     History Chief Complaint  Patient presents with  . Loss of Consciousness    Vincent Stevens is a 74 y.o. male.  HPI   This patient is a 74 year old male, he has a history of prior hypertension, hyperlipidemia, melanoma to the left side of his face as well as a prior of cardiac history.  He reports having a bypass surgery in the past.  This morning he was in his usual state of health when he started to walk back from the bathroom, he reports that everything felt dizzy all of a sudden like the room was spinning, he fell to the ground, tried to get up and fell again, he struck the left side of his face and forehead on the footboard of his bed.  The family members helped him get into the bed, he arrives to the hospital for evaluation of this dizziness which she states has essentially resolved.  He gets worse when he sits up and walks, better when he is laying down, associated with a little bit of ringing of the ears which occurred a couple of weeks ago but resolved.  He has no loss of hearing that is new, he has no chest pain or shortness of breath, no palpitations, no swelling of the legs, no numbness or weakness of the arms.  He denies any difficulty speaking.  The symptoms are intermittent, seem to be associated with moving the head.  Past Medical History:  Diagnosis Date  . Allergy   . Benign prostatic hypertrophy   . BENIGN PROSTATIC HYPERTROPHY 09/19/2009   Qualifier: Diagnosis of  By: Burnice Logan  MD, Doretha Sou   . Chest pain 02/23/2018  . Dyslipidemia 08/14/2010   Qualifier: Diagnosis of  By: Burnice Logan  MD, Doretha Sou   . Essential hypertension 01/17/2009   Qualifier: Diagnosis of  By: Burnice Logan  MD, Doretha Sou   . Gout   . GOUT, ACUTE 11/07/2009   Qualifier: Diagnosis of  By: Burnice Logan  MD, Doretha Sou   . History of colonic polyps 09/27/2008   Qualifier:  Diagnosis of  By: Burnice Logan  MD, Doretha Sou  Initial colonoscopy 2003.  Hyperplastic polyps only Followup colonoscopy 2008.  Normal  10 year interval recommended   . Hyperlipidemia   . Hypertension   . Impaired glucose tolerance 02/20/2013  . Left main coronary artery disease   . Melanoma ([place])   . SOB (shortness of breath) 02/23/2018    Patient Active Problem List   Diagnosis Date Noted  . Pain in both lower extremities 08/15/2018  . Gout   . Melanoma ([place])   . Angina, class III ([place]) 03/01/2018  . Hx of CABG 03/01/2018  . Coronary artery disease involving native coronary artery of native heart with angina pectoris ([place])   . Impaired glucose tolerance 02/20/2013  . Dyslipidemia 08/14/2010  . BPH (benign prostatic hyperplasia) 09/19/2009  . Essential hypertension 01/17/2009  . History of colonic polyps 09/27/2008    Past Surgical History:  Procedure Laterality Date  . CORONARY ARTERY BYPASS GRAFT N/A 03/01/2018   Procedure: CORONARY ARTERY BYPASS GRAFTING (CABG) times three using the right saphaneous vein. Harvested endoscopicly; and left internal mammary artery.;  Surgeon: Gaye Pollack, MD;  [facility];  Service: Open Heart Surgery;  Laterality: N/A;  . HERNIA REPAIR    . LEFT HEART CATH AND CORONARY ANGIOGRAPHY N/A 03/01/2018  Procedure: LEFT HEART CATH AND CORONARY ANGIOGRAPHY;  Surgeon: Leonie Man, MD;  Location: [facility];  Service: Cardiovascular;  Laterality: N/A;  . melanoma removal    . TEE WITHOUT CARDIOVERSION N/A 03/01/2018   Procedure: TRANSESOPHAGEAL ECHOCARDIOGRAM (TEE);  Surgeon: Gaye Pollack, MD;  Location: [facility];  Service: Open Heart Surgery;  Laterality: N/A;       Family History  Problem Relation Age of Onset  . Diabetes Other   . Hypertension Other   . Heart attack Father   . Dementia Mother   . Cancer Sister        Skin    Social History   Tobacco Use  . Smoking status: Former Smoker    Quit date: 11/29/1980    Years since  quitting: 39.9  . Smokeless tobacco: Never Used  Vaping Use  . Vaping Use: Never used  Substance Use Topics  . Alcohol use: No  . Drug use: No    Home Medications Prior to Admission medications   Medication Sig Start Date End Date Taking? Authorizing Provider  aspirin EC 81 MG tablet Take 1 tablet (81 mg total) by mouth daily. 08/18/18  Yes Richardo Priest, MD  ibuprofen (ADVIL,MOTRIN) 200 MG tablet Take 200 mg by mouth 2 (two) times daily as needed (arthritis apin).    Yes [provider]  Multiple Vitamin (MULTIVITAMIN WITH MINERALS) TABS tablet Take 1 tablet by mouth at bedtime.   Yes [provider]  simvastatin (ZOCOR) 80 MG tablet TAKE 1 TABLET AT BEDTIME Patient taking differently: Take 80 mg by mouth at bedtime.  09/19/20  Yes Richardo Priest, MD  tamsulosin (FLOMAX) 0.4 MG CAPS capsule TAKE 1 CAPSULE (0.4 MG TOTAL) BY MOUTH DAILY. Patient taking differently: Take 0.4 mg by mouth at bedtime.  12/06/19  Yes Vivi Barrack, MD  meclizine (ANTIVERT) 25 MG tablet Take 1 tablet (25 mg total) by mouth 3 (three) times daily as needed for dizziness. 10/20/20   Noemi Chapel, MD  ondansetron (ZOFRAN ODT) 4 MG disintegrating tablet Take 1 tablet (4 mg total) by mouth every 8 (eight) hours as needed for nausea or vomiting. 10/20/20   Noemi Chapel, MD    Allergies    Sulfamethoxazole  Review of Systems   Review of Systems  All other systems reviewed and are negative.   Physical Exam Updated Vital Signs BP (!) 173/75   Pulse (!) 59   Temp 97.6 F (36.4 C) (Oral)   Resp 20   Ht 1.753 m (5\' 9" )   Wt 92.1 kg   SpO2 100%   BMI 29.98 kg/m   Physical Exam Vitals and nursing note reviewed.  Constitutional:      General: He is not in acute distress.    Appearance: He is well-developed.  HENT:     Head: Normocephalic and atraumatic.     Mouth/Throat:     Pharynx: No oropharyngeal exudate.  Eyes:     General: No scleral icterus.       Right eye: No discharge.         Left eye: No discharge.     Conjunctiva/sclera: Conjunctivae normal.     Pupils: Pupils are equal, round, and reactive to light.  Neck:     Thyroid: No thyromegaly.     Vascular: No JVD.     Comments: No carotid bruit either side Cardiovascular:     Rate and Rhythm: Normal rate and regular rhythm.  Heart sounds: Normal heart sounds. No murmur heard.  No friction rub. No gallop.   Pulmonary:     Effort: Pulmonary effort is normal. No respiratory distress.     Breath sounds: Normal breath sounds. No wheezing or rales.  Abdominal:     General: Bowel sounds are normal. There is no distension.     Palpations: Abdomen is soft. There is no mass.     Tenderness: There is no abdominal tenderness.  Musculoskeletal:        General: No tenderness. Normal range of motion.     Cervical back: Normal range of motion and neck supple.  Lymphadenopathy:     Cervical: No cervical adenopathy.  Skin:    General: Skin is warm and dry.     Findings: No erythema or rash.  Neurological:     Mental Status: He is alert.     Coordination: Coordination normal.     Comments: Neurologic exam:  Speech clear, pupils equal round reactive to light, extraocular movements intact  Normal peripheral visual fields Cranial nerves III through XII normal including no facial droop Follows commands, moves all extremities x4, normal strength to bilateral upper and lower extremities at all major muscle groups including grip Sensation normal to light touch and pinprick Coordination intact, no limb ataxia, finger-nose-finger normal, heel shin normal bilaterally Rapid alternating movements normal No pronator drift Gait normal Can heal and toe walk without weakness.  He gets vertiginous and has nystagmus with sitting up and looking left in supine position - goes away with looking Right.  Psychiatric:        Behavior: Behavior normal.     ED Results / Procedures / Treatments   Labs (all labs ordered are  listed, but only abnormal results are displayed) Labs Reviewed  BASIC METABOLIC PANEL - Abnormal; Notable for the following components:      Result Value   Glucose, Bld 140 (*)    All other components within normal limits  CBC  URINALYSIS, ROUTINE W REFLEX MICROSCOPIC    EKG EKG Interpretation  Date/Time:  Monday October 20 2020 09:21:34 EST Ventricular Rate:  57 PR Interval:  122 QRS Duration: 86 QT Interval:  452 QTC Calculation: 439 R Axis:   -14 Text Interpretation: Sinus bradycardia with Premature atrial complexes Minimal voltage criteria for LVH, may be normal variant ( R in aVL ) Borderline ECG since last tracing no significant change Confirmed by Noemi Chapel [phone]) on 10/20/2020 2:26:59 PM   Radiology No results found.  Procedures Procedures (including critical care time)  Medications Ordered in ED Medications - No data to display  ED Course  I have reviewed the triage vital signs and the nursing notes.  Pertinent labs & imaging results that were available during my care of the patient were reviewed by me and considered in my medical decision making (see chart for details).    MDM Rules/Calculators/A&P                          VS unremarkable, labs look unremarkable - EKG without acute findings He did fall and struck his head - will get CT to r/o traumatic injury or other IC findings - pt agreeable  lilkley peripheral vertigo  Change of shift - care signed out to Dr. Ayesha Rumpf to f/u results and disposition accordingly.  MRI ordered instead of CT given some slight ataxia with walking   Final Clinical Impression(s) / ED Diagnoses Final diagnoses:  Peripheral  vertigo, unspecified laterality    Rx / DC Orders ED Discharge Orders         Ordered    meclizine (ANTIVERT) 25 MG tablet  3 times daily PRN        10/20/20 1457    ondansetron (ZOFRAN ODT) 4 MG disintegrating tablet  Every 8 hours PRN        10/20/20 1458           Noemi Chapel,  MD 10/20/20 1516    Noemi Chapel, MD 10/20/20 1530

## 2020-10-20 NOTE — ED Triage Notes (Signed)
Pt reports syncopal episode when walking to the restroom this morning, states he then woke up, went back to bed, got up and had another syncopal episode, fell and struck his head on his nightstand. Endorses dizziness prior to syncope. A/ox4, speech clear, face symmetrical, no neuro deficits noted. Reports feeling well last night before bed.

## 2020-10-20 NOTE — Discharge Instructions (Addendum)
Your lab work and EKG were reassuring and normal, the MRI showed no signs of strokes or tumors.  Please take meclizine every 8 hours as needed for dizziness.  Zofran as needed for nausea - Meclizine is an antihistamine and will cause some drowsiness and sleepiness.  You may find that you continue to have dizziness when you look to the left or sit up, movement usually makes it worse, this should get gradually better over the next couple of days however if you are still having symptoms within 48 hours see your family doctor.  If you develop severe or worsening symptoms come back to the emergency department immediately.

## 2020-10-20 NOTE — Progress Notes (Signed)
Pt had appt with Myself today and during visit stated he had fallen twice and hit is head on the left side along with feeling dizzy and unable to get his barring, Vital sign were 190/100B/P  Temp 97.2 resp 20 O2 sat 96% and pulse at 71. Pt along with daughter expressed verbal understanding of cancelling AWV appointment and reschedule at a later date. Pt was advise to go directly to ER for a Ct scan and examination per Dr Jerline Pain advice.    Charlott Rakes, LPN [facility]

## 2020-10-20 NOTE — ED Notes (Signed)
Family at bedside. Patient resting. 

## 2020-10-27 ENCOUNTER — Encounter: Payer: Self-pay | Admitting: Family Medicine

## 2020-10-27 ENCOUNTER — Other Ambulatory Visit: Payer: Self-pay

## 2020-10-27 ENCOUNTER — Ambulatory Visit (INDEPENDENT_AMBULATORY_CARE_PROVIDER_SITE_OTHER): Payer: Medicare HMO | Admitting: Family Medicine

## 2020-10-27 VITALS — BP 130/75 | HR 63 | Temp 97.6°F | Ht 69.0 in | Wt 200.6 lb

## 2020-10-27 DIAGNOSIS — N400 Enlarged prostate without lower urinary tract symptoms: Secondary | ICD-10-CM | POA: Diagnosis not present

## 2020-10-27 DIAGNOSIS — I10 Essential (primary) hypertension: Secondary | ICD-10-CM

## 2020-10-27 DIAGNOSIS — H81399 Other peripheral vertigo, unspecified ear: Secondary | ICD-10-CM

## 2020-10-27 NOTE — Assessment & Plan Note (Signed)
At goal today.  Not currently on any medications.  Was concern for elevated blood pressure last week however this was in setting of acute illness.  Will continue home monitoring with goal 150/90 or lower.

## 2020-10-27 NOTE — Patient Instructions (Signed)
It was very nice to see you today!  I am glad that you are feeling better.  You should continue to improve over the next week or so.  Please let me know if you symptoms return.  I will see you back next month for your annual physical.  Please come back to see me sooner if needed.  Take care, Dr Jerline Pain  Please try these tips to maintain a healthy lifestyle:   Eat at least 3 REAL meals and 1-2 snacks per day.  Aim for no more than 5 hours between eating.  If you eat breakfast, please do so within one hour of getting up.    Each meal should contain half fruits/vegetables, one quarter protein, and one quarter carbs (no bigger than a computer mouse)   Cut down on sweet beverages. This includes juice, soda, and sweet tea.     Drink at least 1 glass of water with each meal and aim for at least 8 glasses per day   Exercise at least 150 minutes every week.

## 2020-10-27 NOTE — Assessment & Plan Note (Signed)
On Flomax 0.4 mg daily.  Potentially could be causing some orthostasis but blood pressure today is at goal at describes symptoms of room spinning not consistent with typical orthostasis.  If has recurrent issues in the future or presyncopal episodes would consider decreasing or stopping Flomax.

## 2020-10-27 NOTE — Progress Notes (Signed)
   Vincent Stevens is a 74 y.o. male who presents today for an office visit.  Assessment/Plan:  New/Acute Problems: Vertigo Brain MRI was normal.  Seems to be recovering normally.  Do not think he needs referral at this point given the symptoms are resolving.  Discussed reasons to return to care.  Chronic Problems Addressed Today: BPH (benign prostatic hyperplasia) On Flomax 0.4 mg daily.  Potentially could be causing some orthostasis but blood pressure today is at goal at describes symptoms of room spinning not consistent with typical orthostasis.  If has recurrent issues in the future or presyncopal episodes would consider decreasing or stopping Flomax.  Essential hypertension At goal today.  Not currently on any medications.  Was concern for elevated blood pressure last week however this was in setting of acute illness.  Will continue home monitoring with goal 150/90 or lower.     Subjective:  HPI:  Patient is here for ED follow-up.  Was here a week ago for annual wellness visit.  The morning of his appointment he had a couple of falls associated dizziness and room spinning sensation.  He fell and struck the left side of his face on the frame.  He was directed to go to emergency room.  In the ED ended up getting an MRI which showed no acute abnormality but did show some chronic changes.  He was diagnosed with peripheral vertigo and discharged home with meclizine.   Has not had any recurrent vertigo since being discharged home.  Symptoms seem to be gradual improving.  Still feels a little bit unsteady on his feet.  No syncope.  No weakness or numbness.         Objective:  Physical Exam: BP 130/75   Pulse 63   Temp 97.6 F (36.4 C) (Temporal)   Ht 5\' 9"  (1.753 m)   Wt 200 lb 9.6 oz (91 kg)   SpO2 99%   BMI 29.62 kg/m   Gen: No acute distress, resting comfortably CV: Regular rate and rhythm with no murmurs appreciated Pulm: Normal work of breathing, clear to auscultation  bilaterally with no crackles, wheezes, or rhonchi Neuro: Grossly normal, moves all extremities Psych: Normal affect and thought content      Blake Vetrano M. Jerline Pain, MD 10/27/2020 3:58 PM

## 2020-12-04 ENCOUNTER — Encounter: Payer: Medicare HMO | Admitting: Family Medicine

## 2020-12-08 ENCOUNTER — Other Ambulatory Visit: Payer: Self-pay

## 2020-12-08 ENCOUNTER — Encounter: Payer: Self-pay | Admitting: Family Medicine

## 2020-12-08 ENCOUNTER — Ambulatory Visit (INDEPENDENT_AMBULATORY_CARE_PROVIDER_SITE_OTHER): Payer: Medicare HMO | Admitting: Family Medicine

## 2020-12-08 VITALS — BP 135/70 | HR 61 | Temp 97.3°F | Ht 69.0 in | Wt 200.6 lb

## 2020-12-08 DIAGNOSIS — R7302 Impaired glucose tolerance (oral): Secondary | ICD-10-CM

## 2020-12-08 DIAGNOSIS — I25119 Atherosclerotic heart disease of native coronary artery with unspecified angina pectoris: Secondary | ICD-10-CM

## 2020-12-08 DIAGNOSIS — E785 Hyperlipidemia, unspecified: Secondary | ICD-10-CM | POA: Diagnosis not present

## 2020-12-08 DIAGNOSIS — Z0001 Encounter for general adult medical examination with abnormal findings: Secondary | ICD-10-CM | POA: Diagnosis not present

## 2020-12-08 DIAGNOSIS — C439 Malignant melanoma of skin, unspecified: Secondary | ICD-10-CM | POA: Diagnosis not present

## 2020-12-08 DIAGNOSIS — I10 Essential (primary) hypertension: Secondary | ICD-10-CM | POA: Diagnosis not present

## 2020-12-08 LAB — LIPID PANEL
Cholesterol: 134 mg/dL (ref 0–200)
HDL: 48.1 mg/dL (ref >39.00)
LDL Cholesterol: 68 mg/dL (ref 0–99)
NonHDL: 85.73
Total CHOL/HDL Ratio: 3
Triglycerides: 90 mg/dL (ref 0.0–149.0)
VLDL: 18 mg/dL (ref 0.0–40.0)

## 2020-12-08 LAB — HEMOGLOBIN A1C: Hgb A1c MFr Bld: 6.1 % (ref 4.6–6.5)

## 2020-12-08 MED ORDER — BENZONATATE 200 MG PO CAPS: 200.0000 mg | ORAL_CAPSULE | Freq: Two times a day (BID) | ORAL | 1 refills | Status: DC | PRN

## 2020-12-08 MED ORDER — TAMSULOSIN HCL 0.4 MG PO CAPS
0.4000 mg | ORAL_CAPSULE | Freq: Every day | ORAL | 3 refills | Status: DC
Start: 2020-12-08 — End: 2021-01-14

## 2020-12-08 NOTE — Progress Notes (Signed)
Chief Complaint:  Vincent Stevens is a 75 y.o. male who presents today for his annual comprehensive physical exam.    Assessment/Plan:  Chronic Problems Addressed Today: Essential hypertension At goal off meds.  Dyslipidemia Continue simvastatin 80 mg daily.  Check lipids today.  Impaired glucose tolerance Check A1c.  Melanoma ([place]) Stable.  Continue management per dermatology.  Coronary artery disease involving native coronary artery of native heart with angina pectoris (HCC) Stable.  Continue management per cardiology.  Preventative Healthcare: Up-to-date on vaccines and screenings.  Due for next colonoscopy 2028.  Check lipids and A1c today.  Recently had CBC and CMET.  Patient Counseling(The following topics were reviewed and/or handout was given):  -Nutrition: Stressed importance of moderation in sodium/caffeine intake, saturated fat and cholesterol, caloric balance, sufficient intake of fresh fruits, vegetables, and fiber.  -Stressed the importance of regular exercise.   -Substance Abuse: Discussed cessation/primary prevention of tobacco, alcohol, or other drug use; driving or other dangerous activities under the influence; availability of treatment for abuse.   -Injury prevention: Discussed safety belts, safety helmets, smoke detector, smoking near bedding or upholstery.   -Sexuality: Discussed sexually transmitted diseases, partner selection, use of condoms, avoidance of unintended pregnancy and contraceptive alternatives.   -Dental health: Discussed importance of regular tooth brushing, flossing, and dental visits.  -Health maintenance and immunizations reviewed. Please refer to Health maintenance section.  Return to care in 1 year for next preventative visit.     Subjective:  HPI:  He has no acute complaints today. See A/p for status of chronic conditions.   Lifestyle Diet: None specific.  Exercise: Limited.   Depression screen [facility] 2/9 12/08/2020  Decreased  Interest 0  Down, Depressed, Hopeless 0  PHQ - 2 Score 0   ROS: Per HPI, otherwise a complete review of systems was negative.   PMH:  The following were reviewed and entered/updated in epic: Past Medical History:  Diagnosis Date  . Allergy   . Benign prostatic hypertrophy   . BENIGN PROSTATIC HYPERTROPHY 09/19/2009   Qualifier: Diagnosis of  By: Burnice Logan  MD, Doretha Sou   . Chest pain 02/23/2018  . Dyslipidemia 08/14/2010   Qualifier: Diagnosis of  By: Burnice Logan  MD, Doretha Sou   . Essential hypertension 01/17/2009   Qualifier: Diagnosis of  By: Burnice Logan  MD, Doretha Sou   . Gout   . GOUT, ACUTE 11/07/2009   Qualifier: Diagnosis of  By: Burnice Logan  MD, Doretha Sou   . History of colonic polyps 09/27/2008   Qualifier: Diagnosis of  By: Burnice Logan  MD, Doretha Sou  Initial colonoscopy 2003.  Hyperplastic polyps only Followup colonoscopy 2008.  Normal  10 year interval recommended   . Hyperlipidemia   . Hypertension   . Impaired glucose tolerance 02/20/2013  . Left main coronary artery disease   . Melanoma ([place])   . SOB (shortness of breath) 02/23/2018   Patient Active Problem List   Diagnosis Date Noted  . Pain in both lower extremities 08/15/2018  . Gout   . Melanoma ([place])   . Angina, class III ([place]) 03/01/2018  . Hx of CABG 03/01/2018  . Coronary artery disease involving native coronary artery of native heart with angina pectoris ([place])   . Impaired glucose tolerance 02/20/2013  . Dyslipidemia 08/14/2010  . BPH (benign prostatic hyperplasia) 09/19/2009  . Essential hypertension 01/17/2009  . History of colonic polyps 09/27/2008   Past Surgical History:  Procedure Laterality Date  . CORONARY ARTERY BYPASS GRAFT N/A 03/01/2018  Procedure: CORONARY ARTERY BYPASS GRAFTING (CABG) times three using the right saphaneous vein. Harvested endoscopicly; and left internal mammary artery.;  Surgeon: Gaye Pollack, MD;  Location: [facility];  Service: Open Heart Surgery;  Laterality: N/A;  . HERNIA  REPAIR    . LEFT HEART CATH AND CORONARY ANGIOGRAPHY N/A 03/01/2018   Procedure: LEFT HEART CATH AND CORONARY ANGIOGRAPHY;  Surgeon: Leonie Man, MD;  Location: [facility];  Service: Cardiovascular;  Laterality: N/A;  . melanoma removal    . TEE WITHOUT CARDIOVERSION N/A 03/01/2018   Procedure: TRANSESOPHAGEAL ECHOCARDIOGRAM (TEE);  Surgeon: Gaye Pollack, MD;  Location: [facility];  Service: Open Heart Surgery;  Laterality: N/A;    Family History  Problem Relation Age of Onset  . Diabetes Other   . Hypertension Other   . Heart attack Father   . Dementia Mother   . Cancer Sister        Skin    Medications- reviewed and updated Current Outpatient Medications  Medication Sig Dispense Refill  . benzonatate (TESSALON) 200 MG capsule Take 1 capsule (200 mg total) by mouth 2 (two) times daily as needed for cough. 60 capsule 1  . aspirin EC 81 MG tablet Take 1 tablet (81 mg total) by mouth daily.    Marland Kitchen ibuprofen (ADVIL,MOTRIN) 200 MG tablet Take 200 mg by mouth 2 (two) times daily as needed (arthritis apin).     . meclizine (ANTIVERT) 25 MG tablet Take 1 tablet (25 mg total) by mouth 3 (three) times daily as needed for dizziness. 30 tablet 0  . Multiple Vitamin (MULTIVITAMIN WITH MINERALS) TABS tablet Take 1 tablet by mouth at bedtime.    . ondansetron (ZOFRAN ODT) 4 MG disintegrating tablet Take 1 tablet (4 mg total) by mouth every 8 (eight) hours as needed for nausea or vomiting. 10 tablet 0  . simvastatin (ZOCOR) 80 MG tablet TAKE 1 TABLET AT BEDTIME (Patient taking differently: Take 80 mg by mouth at bedtime. ) 90 tablet 1  . tamsulosin (FLOMAX) 0.4 MG CAPS capsule Take 1 capsule (0.4 mg total) by mouth daily. 90 capsule 3   No current facility-administered medications for this visit.    Allergies-reviewed and updated Allergies  Allergen Reactions  . Sulfamethoxazole Hives and Itching    Social History   Socioeconomic History  . Marital status: Married    Spouse name: Not  on file  . Number of children: 1  . Years of education: Not on file  . Highest education level: Not on file  Occupational History  . Occupation: Courier     Comment: Part time   Tobacco Use  . Smoking status: Former Smoker    Quit date: 11/29/1980    Years since quitting: 40.0  . Smokeless tobacco: Never Used  Vaping Use  . Vaping Use: Never used  Substance and Sexual Activity  . Alcohol use: No  . Drug use: No  . Sexual activity: Not on file  Other Topics Concern  . Not on file  Social History Narrative  . Not on file   Social Determinants of Health   Financial Resource Strain: Not on file  Food Insecurity: Not on file  Transportation Needs: Not on file  Physical Activity: Not on file  Stress: Not on file  Social Connections: Not on file        Objective:  Physical Exam: BP 135/70   Pulse 61   Temp (!) 97.3 F (36.3 C) (Temporal)   Ht 5'  9" (1.753 m)   Wt 200 lb 9.6 oz (91 kg)   SpO2 99%   BMI 29.62 kg/m   Body mass index is 29.62 kg/m. Wt Readings from Last 3 Encounters:  12/08/20 200 lb 9.6 oz (91 kg)  10/27/20 200 lb 9.6 oz (91 kg)  10/20/20 203 lb (92.1 kg)  Gen: NAD, resting comfortably HEENT: TMs normal bilaterally. OP clear. No thyromegaly noted.  CV: RRR with no murmurs appreciated Pulm: NWOB, CTAB with no crackles, wheezes, or rhonchi GI: Normal bowel sounds present. Soft, Nontender, Nondistended. MSK: no edema, cyanosis, or clubbing noted Skin: warm, dry Neuro: CN2-12 grossly intact. Strength 5/5 in upper and lower extremities. Reflexes symmetric and intact bilaterally.  Psych: Normal affect and thought content     Yusuf Yu M. Jerline Pain, MD 12/08/2020 10:25 AM

## 2020-12-08 NOTE — Assessment & Plan Note (Signed)
Stable.  Continue management per cardiology. 

## 2020-12-08 NOTE — Assessment & Plan Note (Signed)
At goal off meds.

## 2020-12-08 NOTE — Assessment & Plan Note (Signed)
Stable.  Continue management per dermatology. 

## 2020-12-08 NOTE — Patient Instructions (Signed)
It was very nice to see you today!  We will check blood work today.  I will refill your medications.  Please continue working on diet and exercise.  I will see you back in a year for your next physical.  Please select to see me sooner if needed.  Take care, Dr Jerline Pain  Please try these tips to maintain a healthy lifestyle:   Eat at least 3 REAL meals and 1-2 snacks per day.  Aim for no more than 5 hours between eating.  If you eat breakfast, please do so within one hour of getting up.    Each meal should contain half fruits/vegetables, one quarter protein, and one quarter carbs (no bigger than a computer mouse)   Cut down on sweet beverages. This includes juice, soda, and sweet tea.     Drink at least 1 glass of water with each meal and aim for at least 8 glasses per day   Exercise at least 150 minutes every week.

## 2020-12-08 NOTE — Assessment & Plan Note (Signed)
Check A1c. 

## 2020-12-08 NOTE — Assessment & Plan Note (Signed)
Continue simvastatin 80 mg daily.  Check lipids today.

## 2020-12-09 NOTE — Progress Notes (Signed)
Please inform patient of the following:  Blood sugar and cholesterol levels are stable. Would like for him to keep up the good work with diet and exercise and we can recheck in a year or so.  Algis Greenhouse. Jerline Pain, MD 12/09/2020 9:02 AM

## 2020-12-22 ENCOUNTER — Telehealth: Payer: Self-pay

## 2020-12-22 NOTE — Telephone Encounter (Signed)
err

## 2020-12-23 ENCOUNTER — Encounter: Payer: Self-pay | Admitting: Family Medicine

## 2020-12-23 ENCOUNTER — Telehealth (INDEPENDENT_AMBULATORY_CARE_PROVIDER_SITE_OTHER): Payer: Medicare HMO | Admitting: Family Medicine

## 2020-12-23 VITALS — Ht 69.0 in | Wt 200.0 lb

## 2020-12-23 DIAGNOSIS — R059 Cough, unspecified: Secondary | ICD-10-CM | POA: Diagnosis not present

## 2020-12-23 MED ORDER — GUAIFENESIN-CODEINE 100-10 MG/5ML PO SOLN
5.0000 mL | Freq: Three times a day (TID) | ORAL | 0 refills | Status: DC | PRN
Start: 2020-12-23 — End: 2020-12-29

## 2020-12-23 NOTE — Progress Notes (Signed)
   Vincent Stevens is a 75 y.o. male who presents today for a telephone visit.  Assessment/Plan:  New/Acute Problems: Cough Covid test is pending.  No red flags.  Will send in guaifenesin-codeine cough syrup.  And continue using over-the-counter meds.  Encourage good oral hydration.  Discussed reasons to return to care.  He will let us know the results of his Covid test once they come back.    Subjective:  HPI:  Symptoms started 5 days ago. Patient with cough and chest congestion. Worse with laying down. Cough is productive. Has tried taking cough medication which has not helped. No fever. Some malaise. No sick contacts. Was tested for covid at [facility] center. Test has not come back yet. No sinus congestion or sinus pressure.        Objective/Observations   NAD  Telephone Visit   I connected with Vincent Stevens on 12/23/20 at  4:00 PM EST via telephone and verified that I am speaking with the correct person using two identifiers. I discussed the limitations of evaluation and management by telemedicine and the availability of in person appointments. The patient expressed understanding and agreed to proceed.   Patient location: Home Provider location: [place] participating in the virtual visit: Myself and Patient  A total of 7 minutes were spent on medical discussion.      Algis Greenhouse. Jerline Pain, MD 12/23/2020 9:39 AM

## 2020-12-29 ENCOUNTER — Telehealth: Payer: Self-pay

## 2020-12-29 MED ORDER — GUAIFENESIN-CODEINE 100-10 MG/5ML PO SOLN: 5.0000 mL | Freq: Three times a day (TID) | ORAL | 0 refills | Status: DC | PRN

## 2020-12-29 NOTE — Telephone Encounter (Signed)
Pt called to update Dr. Jerline Pain. His covid test came back negative. Pt is also in need of more of the coughing medicine that Dr. Jerline Pain gave him

## 2020-12-29 NOTE — Telephone Encounter (Signed)
Pt called asking if that prescription can be sent in to [id number] - [place], [place]

## 2020-12-29 NOTE — Addendum Note (Signed)
Addended by: Vivi Barrack on: 12/29/2020 01:44 PM   Modules accepted: Orders

## 2020-12-29 NOTE — Telephone Encounter (Signed)
See below

## 2020-12-31 NOTE — Telephone Encounter (Signed)
Rx was send to [facility], called pharmacy patient pick up same day it was send.

## 2021-01-02 IMAGING — MR MR HEAD W/O CM
9 of 10 series · 37 of 48 positions shown · non-contrast
Comparison: None.

CLINICAL DATA: Ataxia and head trauma

EXAM:
MRI HEAD WITHOUT CONTRAST
TECHNIQUE: Multiplanar, multiecho pulse sequences of the brain and surrounding
structures were obtained without intravenous contrast.

[Series 4: DWI · axial · 3.0mm · 1.09mm/px · z∈[-54,+99]mm · 8 of 104 slices shown (1 of 4)]
[im 1/104]
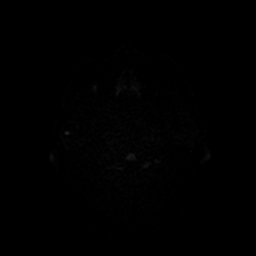
[im 12/104]
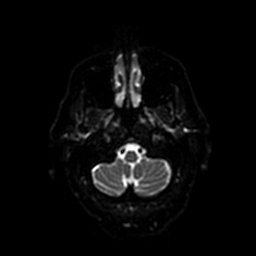
[im 35/104]
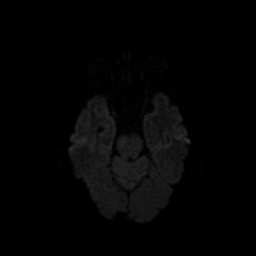
[im 46/104]
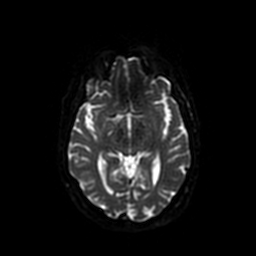
[im 58/104]
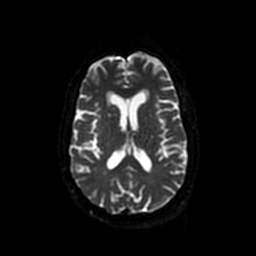
[im 69/104]
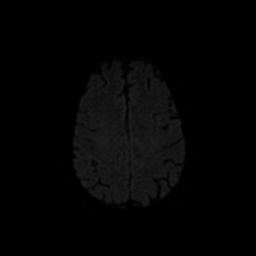
[im 92/104]
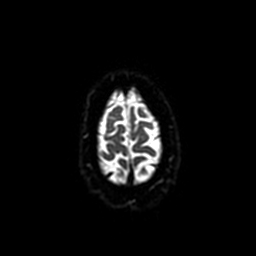
[im 104/104]
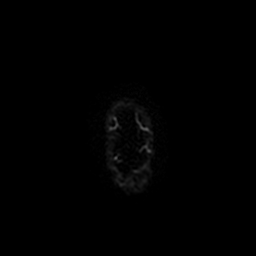

[Series 5: DWI · coronal · 5.0mm · 1.09mm/px · 8 of 80 slices shown (2 of 4)]
[im 1/80]
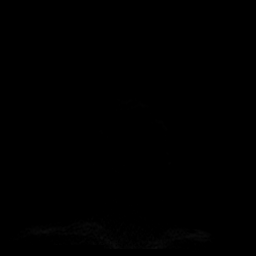
[im 12/80]
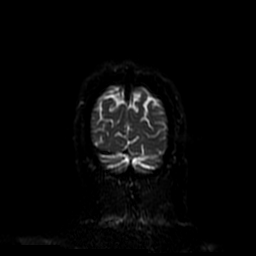
[im 23/80]
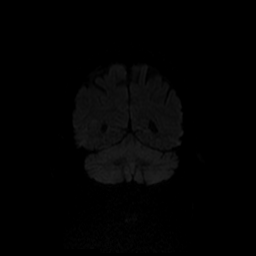
[im 34/80]
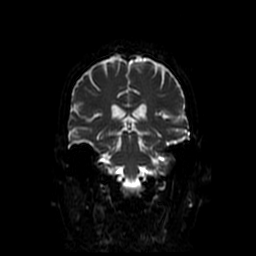
[im 46/80]
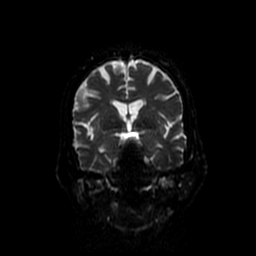
[im 57/80]
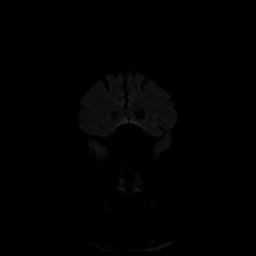
[im 68/80]
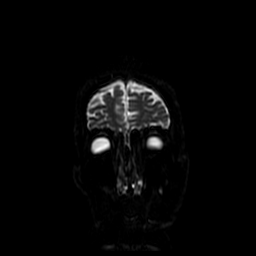
[im 80/80]
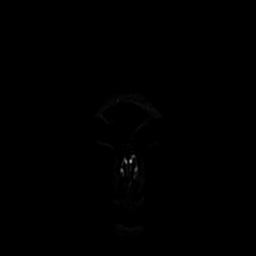

[Series 6: T1 · sagittal · 5.0mm · 0.47mm/px · 2 of 25 slices shown (1 of 2)]
[im 1/25]
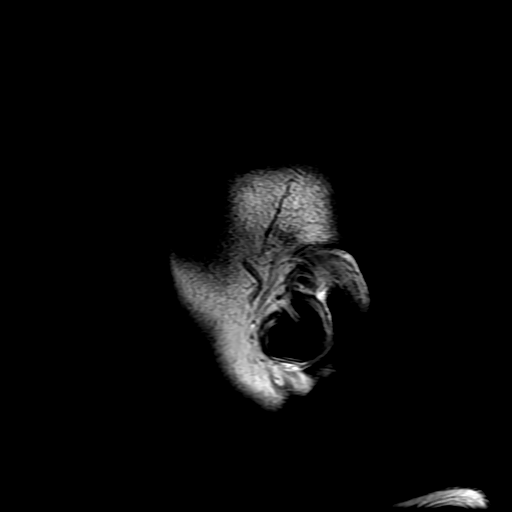
[im 25/25]
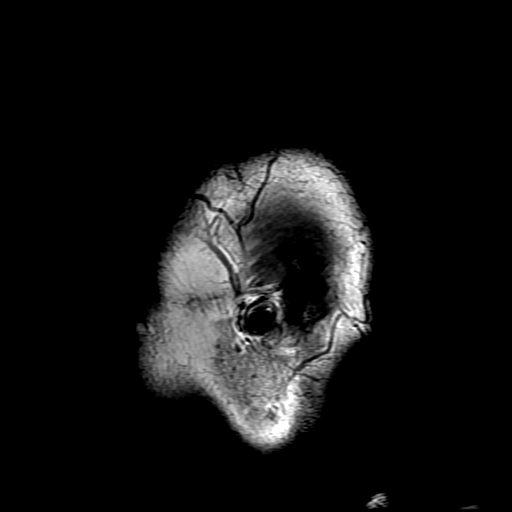

[Series 7: T2 · axial · 5.0mm · 0.47mm/px · z∈[-49,+101]mm · 2 of 26 slices shown (1 of 2)]
[im 1/26]
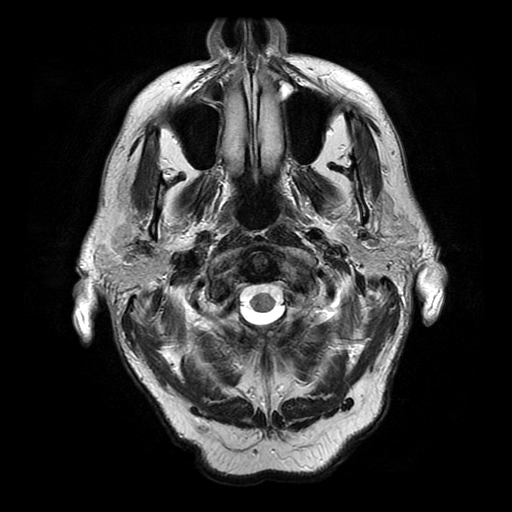
[im 26/26]
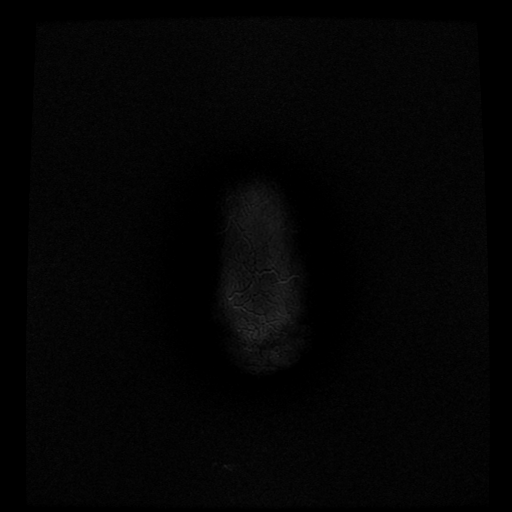

[Series 8: FLAIR · axial · 3.0mm · 0.43mm/px · z∈[-49,+101]mm · 2 of 26 slices shown]
[im 1/26]
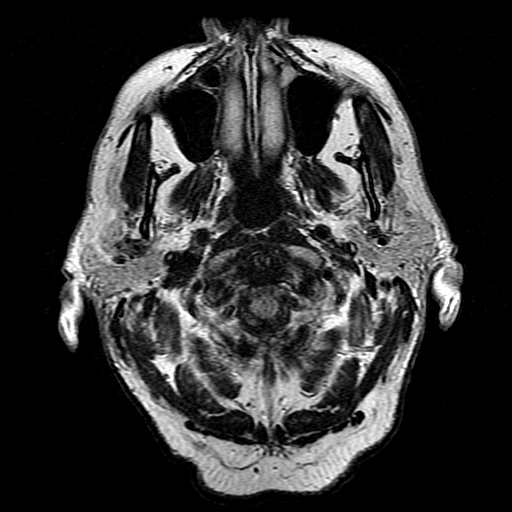
[im 26/26]
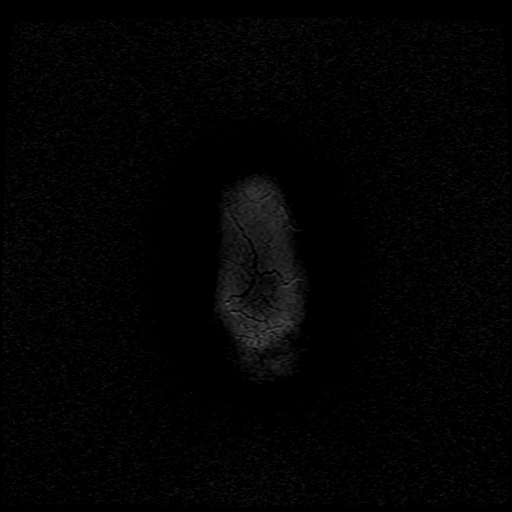

[Series 10: T2 · coronal · 5.0mm · 0.39mm/px · 3 of 31 slices shown (2 of 2)]
[im 1/31]
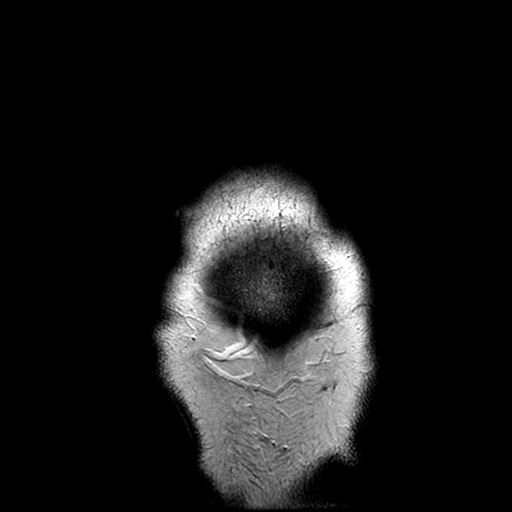
[im 16/31]
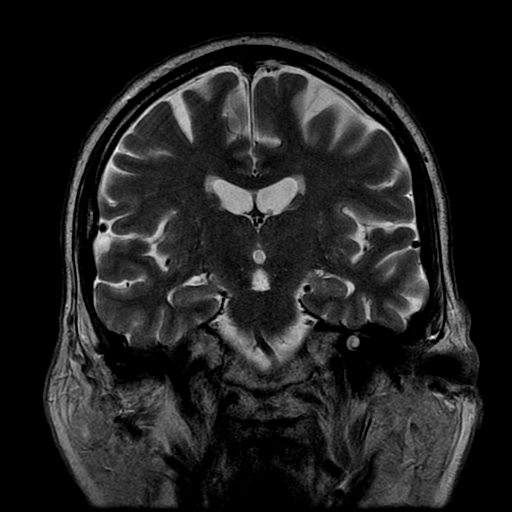
[im 31/31]
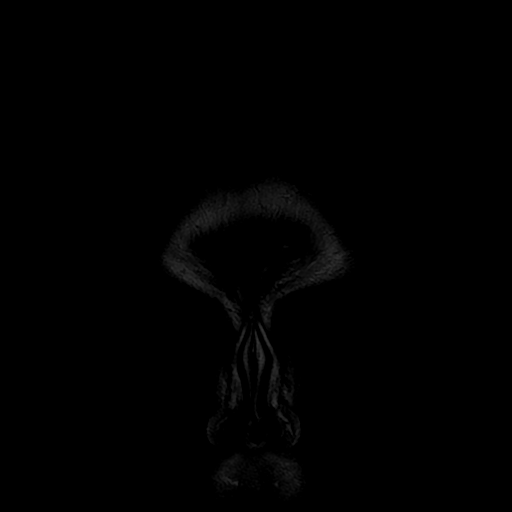

[Series 11: T1 · axial · 3.0mm · 0.47mm/px · z∈[-50,+1]mm · 3 of 104 slices shown (2 of 2)]
[im 1/104]
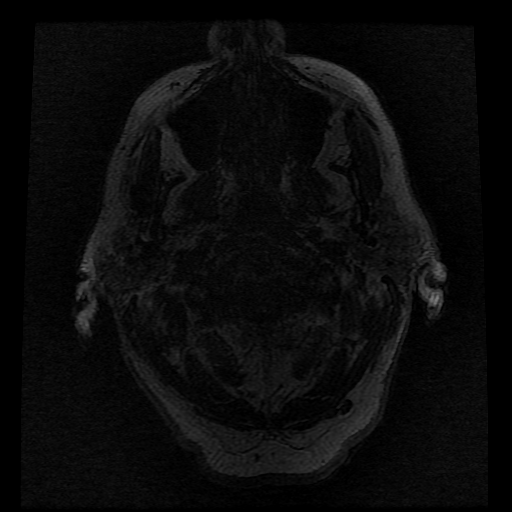
[im 12/104]
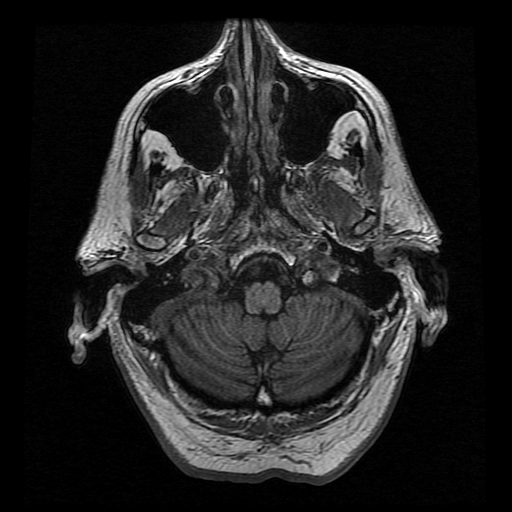
[im 35/104]
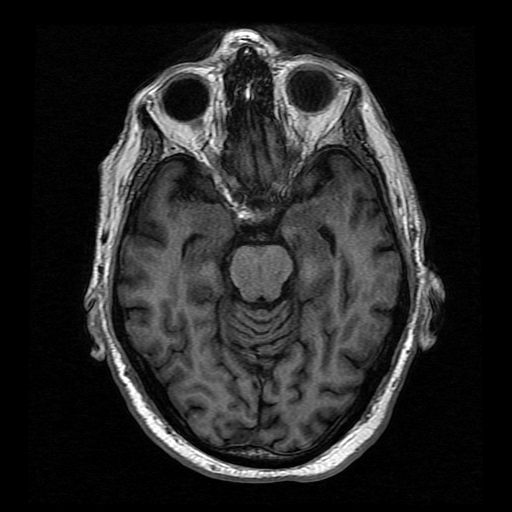

[Series 400: DWI · axial · 3.0mm · 1.09mm/px · z∈[-54,+99]mm · 5 of 52 slices shown (3 of 4)]
[im 1/52]
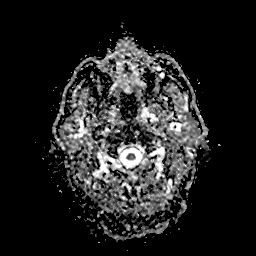
[im 13/52]
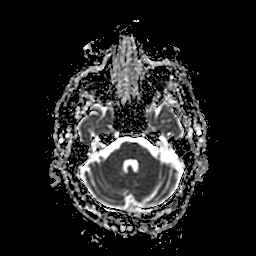
[im 26/52]
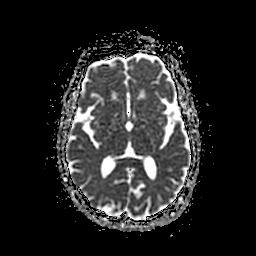
[im 39/52]
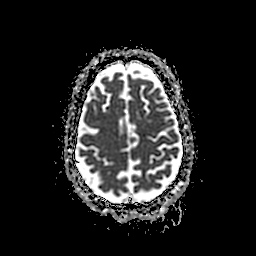
[im 52/52]
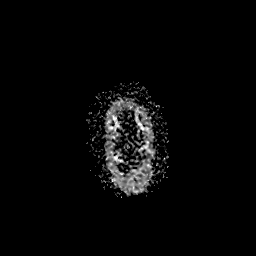

[Series 500: DWI · coronal · 5.0mm · 1.09mm/px · 4 of 40 slices shown (4 of 4)]
[im 1/40]
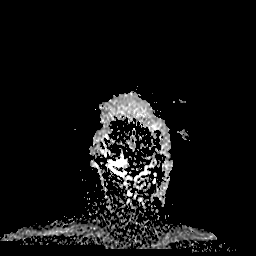
[im 14/40]
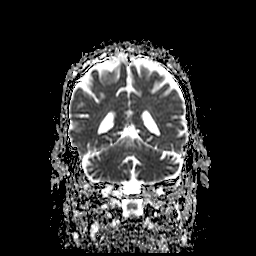
[im 27/40]
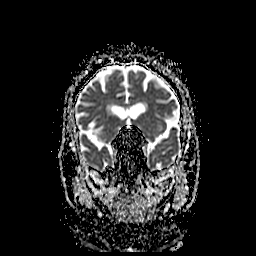
[im 40/40]
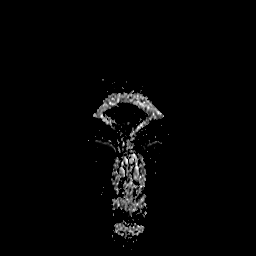

[37 of 48 positions shown; findings below may reference images not displayed]

FINDINGS: Brain: No acute infarct, acute hemorrhage or extra-axial collection.
Multifocal hyperintense T2-weighted signal within the white matter.
Normal volume of CSF spaces. No chronic microhemorrhage. Normal
midline structures.

Vascular: Normal flow voids.

Skull and upper cervical spine: Normal marrow signal.

Sinuses/Orbits: Negative.

Other: None.
IMPRESSION: 1. No acute intracranial abnormality.
2. Multifocal hyperintense T2-weighted signal within the white
matter, nonspecific, but most commonly chronic small vessel
ischemia.

## 2021-01-14 ENCOUNTER — Other Ambulatory Visit: Payer: Self-pay | Admitting: Family Medicine

## 2021-03-13 ENCOUNTER — Other Ambulatory Visit: Payer: Self-pay | Admitting: Cardiology

## 2021-03-13 NOTE — Telephone Encounter (Signed)
Simvastatin approved and sent

## 2021-04-16 DIAGNOSIS — L814 Other melanin hyperpigmentation: Secondary | ICD-10-CM | POA: Diagnosis not present

## 2021-04-16 DIAGNOSIS — L821 Other seborrheic keratosis: Secondary | ICD-10-CM | POA: Diagnosis not present

## 2021-04-16 DIAGNOSIS — D229 Melanocytic nevi, unspecified: Secondary | ICD-10-CM | POA: Diagnosis not present

## 2021-04-16 DIAGNOSIS — L905 Scar conditions and fibrosis of skin: Secondary | ICD-10-CM | POA: Diagnosis not present

## 2021-04-16 DIAGNOSIS — Z85820 Personal history of malignant melanoma of skin: Secondary | ICD-10-CM | POA: Diagnosis not present

## 2021-04-16 DIAGNOSIS — L570 Actinic keratosis: Secondary | ICD-10-CM | POA: Diagnosis not present

## 2021-04-16 DIAGNOSIS — Z85828 Personal history of other malignant neoplasm of skin: Secondary | ICD-10-CM | POA: Diagnosis not present

## 2021-06-10 ENCOUNTER — Other Ambulatory Visit: Payer: Self-pay | Admitting: Cardiology

## 2021-06-17 DIAGNOSIS — J309 Allergic rhinitis, unspecified: Secondary | ICD-10-CM | POA: Insufficient documentation

## 2021-06-17 DIAGNOSIS — T7840XA Allergy, unspecified, initial encounter: Secondary | ICD-10-CM | POA: Insufficient documentation

## 2021-06-17 HISTORY — DX: Allergic rhinitis, unspecified: J30.9

## 2021-06-29 NOTE — Progress Notes (Signed)
Cardiology Office Note:    Date:  06/30/2021   [name], DOB 03-26-1946, MRN [id number]  PCP:  Vivi Barrack, MD  Cardiologist:  Shirlee More, MD    Referring MD: Vivi Barrack, MD    ASSESSMENT:    1. Coronary artery disease involving native coronary artery of native heart with angina pectoris ([place])   2. Hx of CABG   3. Essential hypertension   4. Dyslipidemia    PLAN:    In order of problems listed above:  Stable CAD, having no angina following CABG on current treatment continue medical therapy of aspirin statin and reinstitute antihypertensive therapy.  At this time he does not require an ischemia evaluation Reinstitute antihypertensive therapy ARB purchase arm blood pressure cuff contact us 2 weeks systolics remain greater than 140 at home Continue statin check lipid profile liver function   Next appointment: 6 months   Medication Adjustments/Labs and Tests Ordered: Current medicines are reviewed at length with the patient today.  Concerns regarding medicines are outlined above.  No orders of the defined types were placed in this encounter.  No orders of the defined types were placed in this encounter.   Follow-up CAD   History of Present Illness:    Vincent Stevens is a 75 y.o. male with a hx of CAD with CABG in 2019 and hyperlipidemia last seen 05/09/2021.  Compliance with diet, lifestyle and medications: Yes  In general is done well he said no chest pain edema shortness of breath or syncope. He is bothered that his pulse seems irregular at times of pauses he has had heart rates less than 50 and is concerned of his heart rhythm.  He has never had documented atrial fibrillation or flutter and takes no rate suppressing medication.  His EKG today is sinus bradycardia 56 bpm with APCs. On blood pressure is elevated at times but he is using a wrist wrist cuff and gets erratic measures. Repeat blood pressure by me 162/80 in my office He has a history  of previous hypertension prior to bypass surgery and I Georgina Peer place him on a low-dose of an ARB asked him to purchase an arm blood pressure cuff and contact us in 2 weeks if systolics remain greater than 140 He is tolerating a statin without muscle pain or weakness Past Medical History:  Diagnosis Date   Allergy    Benign prostatic hypertrophy    BENIGN PROSTATIC HYPERTROPHY 09/19/2009   Qualifier: Diagnosis of  By: Burnice Logan  MD, Doretha Sou    Chest pain 02/23/2018   Dyslipidemia 08/14/2010   Qualifier: Diagnosis of  By: Burnice Logan  MD, Doretha Sou    Essential hypertension 01/17/2009   Qualifier: Diagnosis of  By: Burnice Logan  MD, Doretha Sou    Gout    GOUT, ACUTE 11/07/2009   Qualifier: Diagnosis of  By: Burnice Logan  MD, Doretha Sou    History of colonic polyps 09/27/2008   Qualifier: Diagnosis of  By: Burnice Logan  MD, Doretha Sou  Initial colonoscopy 2003.  Hyperplastic polyps only Followup colonoscopy 2008.  Normal  10 year interval recommended    Hyperlipidemia    Hypertension    Impaired glucose tolerance 02/20/2013   Left main coronary artery disease    Melanoma ([facility])    SOB (shortness of breath) 02/23/2018    Past Surgical History:  Procedure Laterality Date   CORONARY ARTERY BYPASS GRAFT N/A 03/01/2018   Procedure: CORONARY ARTERY BYPASS GRAFTING (CABG) times three using the  right saphaneous vein. Harvested endoscopicly; and left internal mammary artery.;  Surgeon: Gaye Pollack, MD;  Location: [facility];  Service: Open Heart Surgery;  Laterality: N/A;   HERNIA REPAIR     LEFT HEART CATH AND CORONARY ANGIOGRAPHY N/A 03/01/2018   Procedure: LEFT HEART CATH AND CORONARY ANGIOGRAPHY;  Surgeon: Leonie Man, MD;  Location: [facility];  Service: Cardiovascular;  Laterality: N/A;   melanoma removal     TEE WITHOUT CARDIOVERSION N/A 03/01/2018   Procedure: TRANSESOPHAGEAL ECHOCARDIOGRAM (TEE);  Surgeon: Gaye Pollack, MD;  Location: [facility];  Service: Open Heart Surgery;  Laterality: N/A;     Current Medications: Current Meds  Medication Sig   aspirin EC 81 MG tablet Take 1 tablet (81 mg total) by mouth daily.   ibuprofen (ADVIL,MOTRIN) 200 MG tablet Take 200 mg by mouth 2 (two) times daily as needed (arthritis apin).    Multiple Vitamin (MULTIVITAMIN WITH MINERALS) TABS tablet Take 1 tablet by mouth at bedtime.   simvastatin (ZOCOR) 80 MG tablet TAKE 1 TABLET AT BEDTIME   tamsulosin (FLOMAX) 0.4 MG CAPS capsule TAKE 1 CAPSULE EVERY DAY     Allergies:   Sulfamethoxazole   Social History   Socioeconomic History   Marital status: Married    Spouse name: Not on file   Number of children: 1   Years of education: Not on file   Highest education level: Not on file  Occupational History   Occupation: Courier     Comment: Part time   Tobacco Use   Smoking status: Former    Types: Cigarettes    Quit date: 11/29/1980    Years since quitting: 40.6   Smokeless tobacco: Never  Vaping Use   Vaping Use: Never used  Substance and Sexual Activity   Alcohol use: No   Drug use: No   Sexual activity: Not on file  Other Topics Concern   Not on file  Social History Narrative   Not on file   Social Determinants of Health   Financial Resource Strain: Not on file  Food Insecurity: Not on file  Transportation Needs: Not on file  Physical Activity: Not on file  Stress: Not on file  Social Connections: Not on file     Family History: The patient's family history includes Cancer in his sister; Dementia in his mother; Diabetes in an other family member; Heart attack in his father; Hypertension in an other family member. ROS:   Please see the history of present illness.    All other systems reviewed and are negative.  EKGs/Labs/Other Studies Reviewed:    The following studies were reviewed today:  EKG:  EKG ordered today and personally reviewed.  The ekg ordered today demonstrates sinus bradycardia with APCs  Recent Labs: 10/20/2020: BUN 16; Creatinine, Ser 1.06;  Hemoglobin 16.5; Platelets 182; Potassium 4.0; Sodium 141  Recent Lipid Panel    Component Value Date/Time   CHOL 134 12/08/2020 1023   CHOL 138 05/09/2020 0840   TRIG 90.0 12/08/2020 1023   HDL 48.10 12/08/2020 1023   HDL 46 05/09/2020 0840   CHOLHDL 3 12/08/2020 1023   VLDL 18.0 12/08/2020 1023   LDLCALC 68 12/08/2020 1023   LDLCALC 77 05/09/2020 0840    Physical Exam:    VS:  BP (!) 155/76 (BP Location: Right Arm, Patient Position: Sitting, Cuff Size: Normal)   Pulse (!) 56   Ht '5\' 9"'$  (1.753 m)   Wt 201 lb 1.3 oz (91.2 kg)  SpO2 98%   BMI 29.69 kg/m     Wt Readings from Last 3 Encounters:  06/30/21 201 lb 1.3 oz (91.2 kg)  12/23/20 200 lb (90.7 kg)  12/08/20 200 lb 9.6 oz (91 kg)  Repeat blood pressure by me 162/80 right arm sitting  GEN:  Well nourished, well developed in no acute distress HEENT: Normal NECK: No JVD; No carotid bruits LYMPHATICS: No lymphadenopathy CARDIAC: RRR, no murmurs, rubs, gallops RESPIRATORY:  Clear to auscultation without rales, wheezing or rhonchi  ABDOMEN: Soft, non-tender, non-distended MUSCULOSKELETAL:  No edema; No deformity  SKIN: Warm and dry NEUROLOGIC:  Alert and oriented x 3 PSYCHIATRIC:  Normal affect    Signed, Shirlee More, MD  06/30/2021 8:41 AM    [place]

## 2021-06-30 ENCOUNTER — Encounter: Payer: Self-pay | Admitting: Cardiology

## 2021-06-30 ENCOUNTER — Ambulatory Visit (INDEPENDENT_AMBULATORY_CARE_PROVIDER_SITE_OTHER): Payer: Medicare HMO

## 2021-06-30 ENCOUNTER — Ambulatory Visit: Payer: Medicare HMO | Admitting: Cardiology

## 2021-06-30 ENCOUNTER — Other Ambulatory Visit: Payer: Self-pay

## 2021-06-30 ENCOUNTER — Telehealth: Payer: Self-pay | Admitting: Cardiology

## 2021-06-30 VITALS — BP 155/76 | HR 56 | Ht 69.0 in | Wt 201.1 lb

## 2021-06-30 DIAGNOSIS — I10 Essential (primary) hypertension: Secondary | ICD-10-CM | POA: Diagnosis not present

## 2021-06-30 DIAGNOSIS — R002 Palpitations: Secondary | ICD-10-CM | POA: Diagnosis not present

## 2021-06-30 DIAGNOSIS — I25119 Atherosclerotic heart disease of native coronary artery with unspecified angina pectoris: Secondary | ICD-10-CM | POA: Diagnosis not present

## 2021-06-30 DIAGNOSIS — Z951 Presence of aortocoronary bypass graft: Secondary | ICD-10-CM

## 2021-06-30 DIAGNOSIS — E785 Hyperlipidemia, unspecified: Secondary | ICD-10-CM

## 2021-06-30 MED ORDER — LOSARTAN POTASSIUM 50 MG PO TABS: 50.0000 mg | ORAL_TABLET | Freq: Every day | ORAL | 3 refills | Status: DC

## 2021-06-30 NOTE — Telephone Encounter (Signed)
New Message:     Patient said he had a Monitor put on this morning, He wants to know how long is he supposed to wear it?

## 2021-06-30 NOTE — Telephone Encounter (Signed)
Called patient to let hi know he is to wear the monitor for 7 days. He verbalized understanding and thanked me for calling him.

## 2021-06-30 NOTE — Addendum Note (Signed)
Addended by: Jerl Santos R on: 06/30/2021 09:03 AM   Modules accepted: Orders

## 2021-06-30 NOTE — Patient Instructions (Addendum)
Medication Instructions:  Your physician has recommended you make the following change in your medication:  START LOSARTAN '50MG'$  ONCE DAILY  *If you need a refill on your cardiac medications before your next appointment, please call your pharmacy*   Lab Work: Your physician recommends that you return for lab work in: [place]  If you have labs (blood work) drawn today and your tests are completely normal, you will receive your results only by: [place] (if you have MyChart) OR A paper copy in the mail If you have any lab test that is abnormal or we need to change your treatment, we will call you to review the results.   Testing/Procedures: EKG ZIO XT- Long Term Monitor Instructions  Your physician has requested you wear a ZIO patch monitor for 14 days.  This is a single patch monitor. [employer] supplies one patch monitor per enrollment. Additional stickers are not available. Please do not apply patch if you will be having a Nuclear Stress Test,  Echocardiogram, Cardiac CT, MRI, or Chest Xray during the period you would be wearing the  monitor. The patch cannot be worn during these tests. You cannot remove and re-apply the  ZIO XT patch monitor.  Your ZIO patch monitor will be mailed 3 day USPS to your address on file. It may take 3-5 days  to receive your monitor after you have been enrolled.  Once you have received your monitor, please review the enclosed instructions. Your monitor  has already been registered assigning a specific monitor serial # to you.  Billing and Patient Assistance Program Information  We have supplied [employer] with any of your insurance information on file for billing purposes. [employer] offers a sliding scale Patient Assistance Program for patients that do not have  insurance, or whose insurance does not completely cover the cost of the ZIO monitor.  You must apply for the Patient Assistance Program to qualify for  this discounted rate.  To apply, please call [employer] at [phone], select option 4, select option 2, ask to apply for  Patient Assistance Program. Theodore Demark will ask your household income, and how many people  are in your household. They will quote your out-of-pocket cost based on that information.  [employer] will also be able to set up a 47-month interest-free payment plan if needed.  Applying the monitor   Shave hair from upper left chest.  Hold abrader disc by orange tab. Rub abrader in 40 strokes over the upper left chest as  indicated in your monitor instructions.  Clean area with 4 enclosed alcohol pads. Let dry.  Apply patch as indicated in monitor instructions. Patch will be placed under collarbone on left  side of chest with arrow pointing upward.  Rub patch adhesive wings for 2 minutes. Remove white label marked "1". Remove the white  label marked "2". Rub patch adhesive wings for 2 additional minutes.  While looking in a mirror, press and release button in center of patch. A small green light will  flash 3-4 times. This will be your only indicator that the monitor has been turned on.  Do not shower for the first 24 hours. You may shower after the first 24 hours.  Press the button if you feel a symptom. You will hear a small click. Record Date, Time and  Symptom in the Patient Logbook.  When you are ready to remove the patch, follow instructions on the last 2 pages of Patient  Logbook. Stick  patch monitor onto the last page of Patient Logbook.  Place Patient Logbook in the blue and white box. Use locking tab on box and tape box closed  securely. The blue and white box has prepaid postage on it. Please place it in the mailbox as  soon as possible. Your physician should have your test results approximately 7 days after the  monitor has been mailed back to [facility].  Call [place] at [phone] if you have questions regarding  your ZIO XT patch monitor.  Call them immediately if you see an orange light blinking on your  monitor.  If your monitor falls off in less than 4 days, contact our Monitor department at [phone].  If your monitor becomes loose or falls off after 4 days call [employer] at [phone] for  suggestions on securing your monitor    Follow-Up: At [facility], you and your health needs are our priority.  As part of our continuing mission to provide you with exceptional heart care, we have created designated Provider Care Teams.  These Care Teams include your primary Cardiologist (physician) and Advanced Practice Providers (APPs -  Physician Assistants and Nurse Practitioners) who all work together to provide you with the care you need, when you need it.  We recommend signing up for the patient portal called "MyChart".  Sign up information is provided on this After Visit Summary.  MyChart is used to connect with patients for Virtual Visits (Telemedicine).  Patients are able to view lab/test results, encounter notes, upcoming appointments, etc.  Non-urgent messages can be sent to your provider as well.   To learn more about what you can do with MyChart, go to [email or web address].    Your next appointment:   1 year(s)  The format for your next appointment:   In Person  Provider:   Shirlee More, MD   Other Instructions    Losartan Tablets What is this medication? LOSARTAN (loe SAR tan) treats high blood pressure. It may also be used to prevent a stroke in people with heart disease and high blood pressure. It can be used to prevent kidney damage in people with diabetes. It works by relaxing the blood vessels, which helps decrease the amount of work your heart has todo. It belongs to a group of medications called ARBs. This medicine may be used for other purposes; ask your health care provider orpharmacist if you have questions. COMMON BRAND NAME(S): Cozaar What should I tell my care team before I take this  medication? They need to know if you have any of these conditions: Heart failure Kidney disease Liver disease An unusual or allergic reaction to losartan, other medications, foods, dyes, or preservatives Pregnant or trying to get pregnant Breast-feeding How should I use this medication? Take this medication by mouth. Take it as directed on the prescription label at the same time every day. You can take it with or without food. If it upsets your stomach, take it with food. Keep taking it unless your care team tells youto stop. Talk to your care team about the use of this medication in children. While it may be prescribed for children as young as 6 for selected conditions,precautions do apply. Overdosage: If you think you have taken too much of this medicine contact apoison control center or emergency room at once. NOTE: This medicine is only for you. Do not share this medicine with others. What if I miss a dose? If you miss a dose, take it as soon  as you can. If it is almost time for yournext dose, take only that dose. Do not take double or extra doses. What may interact with this medication? Aliskiren ACE inhibitors, like enalapril or lisinopril Diuretics, especially amiloride, eplerenone, spironolactone, or triamterene Lithium NSAIDs, medications for pain and inflammation, like ibuprofen or naproxen Potassium salts or potassium supplements This list may not describe all possible interactions. Give your health care provider a list of all the medicines, herbs, non-prescription drugs, or dietary supplements you use. Also tell them if you smoke, drink alcohol, or use illegaldrugs. Some items may interact with your medicine. What should I watch for while using this medication? Visit your care team for regular check ups. Check your blood pressure as directed. Ask your care team what your blood pressure should be. Also, find outwhen you should contact them. Do not treat yourself for coughs, colds,  or pain while you are using this medication without asking your care team for advice. Some medications mayincrease your blood pressure. Women should inform their care team if they wish to become pregnant or think they might be pregnant. There is a potential for serious side effects to anunborn child. Talk to your care team for more information. You may get drowsy or dizzy. Do not drive, use machinery, or do anything that needs mental alertness until you know how this medication affects you. Do not stand or sit up quickly, especially if you are an older patient. This reduces the risk of dizzy or fainting spells. Alcohol can make you more drowsy anddizzy. Avoid alcoholic drinks. Avoid salt substitutes unless you are told otherwise by your care team. What side effects may I notice from receiving this medication? Side effects that you should report to your care team as soon as possible: Allergic reactions-skin rash, itching, hives, swelling of the face, lips, tongue, or throat High potassium level-muscle weakness, fast or irregular heartbeat Kidney injury-decrease in the amount of urine, swelling of the ankles, hands, or feet Low blood pressure-dizziness, feeling faint or lightheaded, blurry vision Side effects that usually do not require medical attention (report to your careteam if they continue or are bothersome): Dizziness Headache Runny or stuffy nose This list may not describe all possible side effects. Call your doctor for medical advice about side effects. You may report side effects to FDA at1-800-FDA-1088. Where should I keep my medication? Keep out of the reach of children and pets. Store at room temperature between 20 and 25 degrees C (68 and 77 degrees F). Protect from light. Keep the container tightly closed. Get rid of any unusedmedication after the expiration date. To get rid of medications that are no longer needed or have expired: Take the medication to a medication take-back  program. Check with your pharmacy or law enforcement to find a location. If you cannot return the medication, check the label or package insert to see if the medication should be thrown out in the garbage or flushed down the toilet. If you are not sure, ask your care team. If it is safe to put in the trash, empty the medication out of the container. Mix the medication with cat litter, dirt, coffee grounds, or other unwanted substance. Seal the mixture in a bag or container. Put it in the trash. NOTE: This sheet is a summary. It may not cover all possible information. If you have questions about this medicine, talk to your doctor, pharmacist, orhealth care provider.  2022 Elsevier/Gold Standard (2020-10-08 13:49:17)

## 2021-07-01 ENCOUNTER — Telehealth: Payer: Self-pay

## 2021-07-01 LAB — LIPID PANEL
Chol/HDL Ratio: 3.1 ratio (ref 0.0–5.0)
Cholesterol, Total: 156 mg/dL (ref 100–199)
HDL: 50 mg/dL (ref >39)
LDL Chol Calc (NIH): 87 mg/dL (ref 0–99)
Triglycerides: 102 mg/dL (ref 0–149)
VLDL Cholesterol Cal: 19 mg/dL (ref 5–40)

## 2021-07-01 LAB — COMPREHENSIVE METABOLIC PANEL
ALT: 16 IU/L (ref 0–44)
AST: 24 IU/L (ref 0–40)
Albumin/Globulin Ratio: 2.1 (ref 1.2–2.2)
Albumin: 4.8 g/dL — ABNORMAL HIGH (ref 3.7–4.7)
Alkaline Phosphatase: 72 IU/L (ref 44–121)
BUN/Creatinine Ratio: 14 (ref 10–24)
BUN: 14 mg/dL (ref 8–27)
Bilirubin Total: 0.6 mg/dL (ref 0.0–1.2)
CO2: 22 mmol/L (ref 20–29)
Calcium: 9.4 mg/dL (ref 8.6–10.2)
Chloride: 102 mmol/L (ref 96–106)
Creatinine, Ser: 1.03 mg/dL (ref 0.76–1.27)
Globulin, Total: 2.3 g/dL (ref 1.5–4.5)
Glucose: 103 mg/dL — ABNORMAL HIGH (ref 65–99)
Potassium: 4.7 mmol/L (ref 3.5–5.2)
Sodium: 141 mmol/L (ref 134–144)
Total Protein: 7.1 g/dL (ref 6.0–8.5)
eGFR: 75 mL/min/{1.73_m2} (ref >59)

## 2021-07-01 NOTE — Telephone Encounter (Signed)
-----   Message from Richardo Priest, MD sent at 07/01/2021  8:09 AM EDT ----- Good result no change in treatment

## 2021-07-01 NOTE — Telephone Encounter (Signed)
Spoke with patient regarding results and recommendation.  Patient verbalizes understanding and is agreeable to plan of care. Advised patient to call back with any issues or concerns.  

## 2021-07-06 DIAGNOSIS — R002 Palpitations: Secondary | ICD-10-CM | POA: Diagnosis not present

## 2021-07-15 ENCOUNTER — Telehealth: Payer: Self-pay | Admitting: Cardiology

## 2021-07-15 DIAGNOSIS — R002 Palpitations: Secondary | ICD-10-CM | POA: Diagnosis not present

## 2021-07-15 NOTE — Telephone Encounter (Signed)
[place] from [place] calling with abnormal Zio patch results.

## 2021-07-15 NOTE — Telephone Encounter (Signed)
Spoke to [place]. Posted to [employer]. For 6 days patient had 190 episodes SVT, symptomatic brady on strip 5, 39 bpm, lasted 30 secs.

## 2021-07-20 ENCOUNTER — Telehealth: Payer: Self-pay

## 2021-07-20 NOTE — Telephone Encounter (Signed)
-----   Message from Richardo Priest, MD sent at 07/19/2021 12:52 PM EDT ----- His monitor shows no severe arrhythmia.  There are no episodes of atrial fibrillation or flutter.  He is aware when he has extra beats  There is no severe slowing of the heart rhythm.  At this time I would ask him to continue to trend his heart rate and blood pressures at home no new medications are needed.

## 2021-07-20 NOTE — Telephone Encounter (Signed)
Patient notified of results and recommendations and agree with plan

## 2021-07-29 ENCOUNTER — Other Ambulatory Visit: Payer: Self-pay

## 2021-07-29 MED ORDER — LOSARTAN POTASSIUM 50 MG PO TABS: 50.0000 mg | ORAL_TABLET | Freq: Every day | ORAL | 3 refills | Status: DC

## 2021-07-29 NOTE — Telephone Encounter (Signed)
Refill of Losartan 50 mg sent to [facility].

## 2021-08-15 ENCOUNTER — Ambulatory Visit (INDEPENDENT_AMBULATORY_CARE_PROVIDER_SITE_OTHER): Payer: Medicare HMO | Admitting: Cardiology

## 2021-08-15 ENCOUNTER — Encounter: Payer: Self-pay | Admitting: Cardiology

## 2021-08-15 DIAGNOSIS — Z0000 Encounter for general adult medical examination without abnormal findings: Secondary | ICD-10-CM | POA: Diagnosis not present

## 2021-08-15 NOTE — Patient Instructions (Addendum)
Health Maintenance, Male Adopting a healthy lifestyle and getting preventive care are important in promoting health and wellness. Ask your health care provider about: The right schedule for you to have regular tests and exams. Things you can do on your own to prevent diseases and keep yourself healthy. What should I know about diet, weight, and exercise? Eat a healthy diet  Eat a diet that includes plenty of vegetables, fruits, low-fat dairy products, and lean protein. Do not eat a lot of foods that are high in solid fats, added sugars, or sodium. Maintain a healthy weight Body mass index (BMI) is a measurement that can be used to identify possible weight problems. It estimates body fat based on height and weight. Your health care provider can help determine your BMI and help you achieve or maintain a healthy weight. Get regular exercise Get regular exercise. This is one of the most important things you can do for your health. Most adults should: Exercise for at least 150 minutes each week. The exercise should increase your heart rate and make you sweat (moderate-intensity exercise). Do strengthening exercises at least twice a week. This is in addition to the moderate-intensity exercise. Spend less time sitting. Even light physical activity can be beneficial. Watch cholesterol and blood lipids Have your blood tested for lipids and cholesterol at 76 years of age, then have this test every 5 years. You may need to have your cholesterol levels checked more often if: Your lipid or cholesterol levels are high. You are older than 76 years of age. You are at high risk for heart disease. What should I know about cancer screening? Many types of cancers can be detected early and may often be prevented. Depending on your health history and family history, you may need to have cancer screening at various ages. This may include screening for: Colorectal cancer. Prostate cancer. Skin cancer. Lung  cancer. What should I know about heart disease, diabetes, and high blood pressure? Blood pressure and heart disease High blood pressure causes heart disease and increases the risk of stroke. This is more likely to develop in people who have high blood pressure readings, are of African descent, or are overweight. Talk with your health care provider about your target blood pressure readings. Have your blood pressure checked: Every 3-5 years if you are 18-39 years of age. Every year if you are 40 years old or older. If you are between the ages of 65 and 75 and are a current or former smoker, ask your health care provider if you should have a one-time screening for abdominal aortic aneurysm (AAA). Diabetes Have regular diabetes screenings. This checks your fasting blood sugar level. Have the screening done: Once every three years after age 45 if you are at a normal weight and have a low risk for diabetes. More often and at a younger age if you are overweight or have a high risk for diabetes. What should I know about preventing infection? Hepatitis B If you have a higher risk for hepatitis B, you should be screened for this virus. Talk with your health care provider to find out if you are at risk for hepatitis B infection. Hepatitis C Blood testing is recommended for: Everyone born from 1945 through 1965. Anyone with known risk factors for hepatitis C. Sexually transmitted infections (STIs) You should be screened each year for STIs, including gonorrhea and chlamydia, if: You are sexually active and are younger than 76 years of age. You are older than 76 years   of age and your health care provider tells you that you are at risk for this type of infection. Your sexual activity has changed since you were last screened, and you are at increased risk for chlamydia or gonorrhea. Ask your health care provider if you are at risk. Ask your health care provider about whether you are at high risk for HIV.  Your health care provider may recommend a prescription medicine to help prevent HIV infection. If you choose to take medicine to prevent HIV, you should first get tested for HIV. You should then be tested every 3 months for as long as you are taking the medicine. Follow these instructions at home: Lifestyle Do not use any products that contain nicotine or tobacco, such as cigarettes, e-cigarettes, and chewing tobacco. If you need help quitting, ask your health care provider. Do not use street drugs. Do not share needles. Ask your health care provider for help if you need support or information about quitting drugs. Alcohol use Do not drink alcohol if your health care provider tells you not to drink. If you drink alcohol: Limit how much you have to 0-2 drinks a day. Be aware of how much alcohol is in your drink. In the [place] one drink equals one 12 oz bottle of beer (355 mL), one 5 oz glass of wine (148 mL), or one 1 oz glass of hard liquor (44 mL). General instructions Schedule regular health, dental, and eye exams. Stay current with your vaccines. Tell your health care provider if: You often feel depressed. You have ever been abused or do not feel safe at home. Summary Adopting a healthy lifestyle and getting preventive care are important in promoting health and wellness. Follow your health care provider's instructions about healthy diet, exercising, and getting tested or screened for diseases. Follow your health care provider's instructions on monitoring your cholesterol and blood pressure. This information is not intended to replace advice given to you by your health care provider. Make sure you discuss any questions you have with your health care provider. Document Revised: 01/23/2021 Document Reviewed: 11/08/2018 Elsevier Patient Education  2022 [place] Maintenance, Male Adopting a healthy lifestyle and getting preventive care are important in promoting health and  wellness. Ask your health care provider about: The right schedule for you to have regular tests and exams. Things you can do on your own to prevent diseases and keep yourself healthy. What should I know about diet, weight, and exercise? Eat a healthy diet  Eat a diet that includes plenty of vegetables, fruits, low-fat dairy products, and lean protein. Do not eat a lot of foods that are high in solid fats, added sugars, or sodium. Maintain a healthy weight Body mass index (BMI) is a measurement that can be used to identify possible weight problems. It estimates body fat based on height and weight. Your health care provider can help determine your BMI and help you achieve or maintain a healthy weight. Get regular exercise Get regular exercise. This is one of the most important things you can do for your health. Most adults should: Exercise for at least 150 minutes each week. The exercise should increase your heart rate and make you sweat (moderate-intensity exercise). Do strengthening exercises at least twice a week. This is in addition to the moderate-intensity exercise. Spend less time sitting. Even light physical activity can be beneficial. Watch cholesterol and blood lipids Have your blood tested for lipids and cholesterol at 76 years of age, then have  this test every 5 years. You may need to have your cholesterol levels checked more often if: Your lipid or cholesterol levels are high. You are older than 76 years of age. You are at high risk for heart disease. What should I know about cancer screening? Many types of cancers can be detected early and may often be prevented. Depending on your health history and family history, you may need to have cancer screening at various ages. This may include screening for: Colorectal cancer. Prostate cancer. Skin cancer. Lung cancer. What should I know about heart disease, diabetes, and high blood pressure? Blood pressure and heart disease High  blood pressure causes heart disease and increases the risk of stroke. This is more likely to develop in people who have high blood pressure readings, are of African descent, or are overweight. Talk with your health care provider about your target blood pressure readings. Have your blood pressure checked: Every 3-5 years if you are 78-59 years of age. Every year if you are 39 years old or older. If you are between the ages of 13 and 23 and are a current or former smoker, ask your health care provider if you should have a one-time screening for abdominal aortic aneurysm (AAA). Diabetes Have regular diabetes screenings. This checks your fasting blood sugar level. Have the screening done: Once every three years after age 64 if you are at a normal weight and have a low risk for diabetes. More often and at a younger age if you are overweight or have a high risk for diabetes. What should I know about preventing infection? Hepatitis B If you have a higher risk for hepatitis B, you should be screened for this virus. Talk with your health care provider to find out if you are at risk for hepatitis B infection. Hepatitis C Blood testing is recommended for: Everyone born from [birth year] through 1965. Anyone with known risk factors for hepatitis C. Sexually transmitted infections (STIs) You should be screened each year for STIs, including gonorrhea and chlamydia, if: You are sexually active and are younger than 76 years of age. You are older than 76 years of age and your health care provider tells you that you are at risk for this type of infection. Your sexual activity has changed since you were last screened, and you are at increased risk for chlamydia or gonorrhea. Ask your health care provider if you are at risk. Ask your health care provider about whether you are at high risk for HIV. Your health care provider may recommend a prescription medicine to help prevent HIV infection. If you choose to take medicine  to prevent HIV, you should first get tested for HIV. You should then be tested every 3 months for as long as you are taking the medicine. Follow these instructions at home: Lifestyle Do not use any products that contain nicotine or tobacco, such as cigarettes, e-cigarettes, and chewing tobacco. If you need help quitting, ask your health care provider. Do not use street drugs. Do not share needles. Ask your health care provider for help if you need support or information about quitting drugs. Alcohol use Do not drink alcohol if your health care provider tells you not to drink. If you drink alcohol: Limit how much you have to 0-2 drinks a day. Be aware of how much alcohol is in your drink. In the [place] one drink equals one 12 oz bottle of beer (355 mL), one 5 oz glass of wine (148 mL), or one  1 oz glass of hard liquor (44 mL). General instructions Schedule regular health, dental, and eye exams. Stay current with your vaccines. Tell your health care provider if: You often feel depressed. You have ever been abused or do not feel safe at home. Summary Adopting a healthy lifestyle and getting preventive care are important in promoting health and wellness. Follow your health care provider's instructions about healthy diet, exercising, and getting tested or screened for diseases. Follow your health care provider's instructions on monitoring your cholesterol and blood pressure. This information is not intended to replace advice given to you by your health care provider. Make sure you discuss any questions you have with your health care provider. Document Revised: 01/23/2021 Document Reviewed: 11/08/2018 Elsevier Patient Education  2022 [employer].

## 2021-08-15 NOTE — Progress Notes (Addendum)
Subjective:   Vincent Stevens is a 75 y.o. male who presents for Medicare Annual/Subsequent preventive examination.  I connected with  Vincent Stevens on 08/15/21 by a audio enabled telemedicine application and verified that I am speaking with the correct person using two identifiers.   I discussed the limitations of evaluation and management by telemedicine. The patient expressed understanding and agreed to proceed.   Location of Patient: Home Location of Provider: Office  Review of Systems    Defer to PCO Cardiac Risk Factors include: advanced age (>45mn, >>43women);dyslipidemia;male gender     Objective:    There were no vitals filed for this visit. There is no height or weight on file to calculate BMI.  Advanced Directives 08/15/2021 10/20/2020 09/14/2019 03/01/2018 03/01/2018  Does Patient Have a Medical Advance Directive? No No No No No  Does patient want to make changes to medical advance directive? No - Patient declined - - - -  Would patient like information on creating a medical advance directive? No - Patient declined Yes (ED - Information included in AVS) Yes (MAU/Ambulatory/Procedural Areas - Information given) No - Patient declined No - Patient declined    Current Medications (verified) Outpatient Encounter Medications as of 08/15/2021  Medication Sig   aspirin EC 81 MG tablet Take 1 tablet (81 mg total) by mouth daily.   ibuprofen (ADVIL,MOTRIN) 200 MG tablet Take 200 mg by mouth 2 (two) times daily as needed (arthritis apin).    losartan (COZAAR) 50 MG tablet Take 1 tablet (50 mg total) by mouth daily.   Multiple Vitamin (MULTIVITAMIN WITH MINERALS) TABS tablet Take 1 tablet by mouth at bedtime.   simvastatin (ZOCOR) 80 MG tablet TAKE 1 TABLET AT BEDTIME   tamsulosin (FLOMAX) 0.4 MG CAPS capsule TAKE 1 CAPSULE EVERY DAY   [DISCONTINUED] benzonatate (TESSALON) 200 MG capsule Take 1 capsule (200 mg total) by mouth 2 (two) times daily as needed for cough. (Patient not  taking: Reported on 06/30/2021)   No facility-administered encounter medications on file as of 08/15/2021.    Allergies (verified) Sulfamethoxazole   History: Past Medical History:  Diagnosis Date   Allergy    BENIGN PROSTATIC HYPERTROPHY 09/19/2009   Qualifier: Diagnosis of  By: KBurnice Logan MD, PDoretha Sou   Chest pain 02/23/2018   Dyslipidemia 08/14/2010   Qualifier: Diagnosis of  By: KBurnice Logan MD, PDoretha Sou   Essential hypertension 01/17/2009   Qualifier: Diagnosis of  By: KBurnice Logan MD, PDoretha Sou   GOUT, ACUTE 11/07/2009   Qualifier: Diagnosis of  By: KBurnice Logan MD, PDoretha Sou   History of colonic polyps 09/27/2008   Qualifier: Diagnosis of  By: KBurnice Logan MD, PDoretha Sou Initial colonoscopy 2003.  Hyperplastic polyps only Followup colonoscopy 2008.  Normal  10 year interval recommended    Impaired glucose tolerance 02/20/2013   Left main coronary artery disease    Melanoma (HCC)    SOB (shortness of breath) 02/23/2018   Past Surgical History:  Procedure Laterality Date   CORONARY ARTERY BYPASS GRAFT N/A 03/01/2018   Procedure: CORONARY ARTERY BYPASS GRAFTING (CABG) times three using the right saphaneous vein. Harvested endoscopicly; and left internal mammary artery.;  Surgeon: BGaye Pollack MD;  Location: [facility];  Service: Open Heart Surgery;  Laterality: N/A;   HERNIA REPAIR     LEFT HEART CATH AND CORONARY ANGIOGRAPHY N/A 03/01/2018   Procedure: LEFT HEART CATH AND CORONARY ANGIOGRAPHY;  Surgeon: HLeonie Man MD;  Location: [place] [facility];  Service: Cardiovascular;  Laterality: N/A;   melanoma removal     TEE WITHOUT CARDIOVERSION N/A 03/01/2018   Procedure: TRANSESOPHAGEAL ECHOCARDIOGRAM (TEE);  Surgeon: Gaye Pollack, MD;  Location: [facility];  Service: Open Heart Surgery;  Laterality: N/A;   Family History  Problem Relation Age of Onset   Dementia Mother    Heart attack Father    Cancer Sister        Skin   Diabetes Other    Hypertension Other    Social  History   Socioeconomic History   Marital status: Married    Spouse name: Not on file   Number of children: 1   Years of education: Not on file   Highest education level: Not on file  Occupational History   Occupation: Courier     Comment: Part time   Tobacco Use   Smoking status: Former    Types: Cigarettes    Quit date: 11/29/1980    Years since quitting: 40.7   Smokeless tobacco: Never  Vaping Use   Vaping Use: Never used  Substance and Sexual Activity   Alcohol use: No   Drug use: No   Sexual activity: Not on file  Other Topics Concern   Not on file  Social History Narrative   Not on file   Social Determinants of Health   Financial Resource Strain: Low Risk    Difficulty of Paying Living Expenses: Not hard at all  Food Insecurity: No Food Insecurity   Worried About Charity fundraiser in the Last Year: Never true   [place] in the Last Year: Never true  Transportation Needs: No Transportation Needs   Lack of Transportation (Medical): No   Lack of Transportation (Non-Medical): No  Physical Activity: Inactive   Days of Exercise per Week: 0 days   Minutes of Exercise per Session: 0 min  Stress: No Stress Concern Present   Feeling of Stress : Not at all  Social Connections: Moderately Isolated   Frequency of Communication with Friends and Family: More than three times a week   Frequency of Social Gatherings with Friends and Family: Once a week   Attends Religious Services: Never   Marine scientist or Organizations: No   Attends Music therapist: Never   Marital Status: Married    Tobacco Counseling Counseling given: Not Answered   Clinical Intake:  Pre-visit preparation completed: Yes  Pain : No/denies pain     Nutritional Risks: None Diabetes: No  How often do you need to have someone help you when you read instructions, pamphlets, or other written materials from your doctor or pharmacy?: 1 - Never What is the last grade  level you completed in school?: 12 years  Diabetic?No  Interpreter Needed?: No  Information entered by :: Rennis Harding   Activities of Daily Living In your present state of health, do you have any difficulty performing the following activities: 08/15/2021 12/08/2020  Hearing? N N  Vision? N N  Difficulty concentrating or making decisions? N N  Walking or climbing stairs? N N  Comment - -  Dressing or bathing? N N  Doing errands, shopping? N N  Preparing Food and eating ? N -  Using the Toilet? N -  In the past six months, have you accidently leaked urine? N -  Do you have problems with loss of bowel control? N -  Managing your Medications? N -  Managing  your Finances? N -  Housekeeping or managing your Housekeeping? N -  Some recent data might be hidden    Patient Care Team: Vivi Barrack, MD as PCP - General (Family Medicine)  Indicate any recent Medical Services you may have received from other than [facility] providers in the past year (date may be approximate).     Assessment:   This is a routine wellness examination for Vincent Stevens.  Hearing/Vision screen No results found.  Dietary issues and exercise activities discussed: Current Exercise Habits: The patient does not participate in regular exercise at present, Exercise limited by: None identified   Goals Addressed   None   Depression Screen PHQ 2/9 Scores 08/15/2021 08/15/2021 12/08/2020 12/04/2019 09/14/2019 09/11/2018 06/21/2018  PHQ - 2 Score 0 0 0 0 0 0 0    Fall Risk Fall Risk  08/15/2021 12/08/2020 12/04/2019 09/14/2019 06/21/2016  Falls in the past year? 1 1 0 0 No  Number falls in past yr: 0 0 - - -  Injury with Fall? 0 0 - 0 -  Risk for fall due to : Other (Comment) - - - -  Risk for fall due to: Comment episode of vertigo - - - -  Follow up - - - Falls evaluation completed;Education provided;Falls prevention discussed -    FALL RISK PREVENTION PERTAINING TO THE HOME:  Any stairs in or around the home? Yes   If so, are there any without handrails? Yes  Home free of loose throw rugs in walkways, pet beds, electrical cords, etc? Yes Adequate lighting in your home to reduce risk of falls? Yes   ASSISTIVE DEVICES UTILIZED TO PREVENT FALLS:  Life alert? No  Use of a cane, walker or w/c? No  Grab bars in the bathroom? No  Shower chair or bench in shower? No  Elevated toilet seat or a handicapped toilet? No   TIMED UP AND GO:  Was the test performed?  N/A .  Length of time to ambulate 10 feet: N/A sec.     Cognitive Function:     6CIT Screen 08/15/2021 09/14/2019  What Year? 0 points 0 points  What month? 0 points 0 points  What time? 0 points 0 points  Count back from 20 0 points 0 points  Months in reverse 0 points 0 points  Repeat phrase 0 points 0 points  Total Score 0 0    Immunizations Immunization History  Administered Date(s) Administered   Fluad Quad(high Dose 65+) 08/08/2019   Influenza Whole 08/12/2009   Influenza, High Dose Seasonal PF 09/01/2013, 09/16/2014, 09/01/2016, 08/28/2017   Influenza,inj,Quad PF,6+ Mos 08/15/2018   Influenza-Unspecified 09/01/2016, 08/28/2017, 09/01/2020   PFIZER(Purple Top)SARS-COV-2 Vaccination 02/02/2020, 03/04/2020, 11/26/2020   Pneumococcal Conjugate-13 04/15/2014   Pneumococcal Polysaccharide-23 04/15/2009, 06/09/2015   Tetanus 04/15/2014    TDAP status: Up to date  Flu Vaccine status: Due, Education has been provided regarding the importance of this vaccine. Advised may receive this vaccine at local pharmacy or Health Dept. Aware to provide a copy of the vaccination record if obtained from local pharmacy or Health Dept. Verbalized acceptance and understanding.  Pneumococcal vaccine status: Due, Education has been provided regarding the importance of this vaccine. Advised may receive this vaccine at local pharmacy or Health Dept. Aware to provide a copy of the vaccination record if obtained from local pharmacy or Health Dept.  Verbalized acceptance and understanding.  Covid-19 vaccine status: Completed vaccines  Qualifies for Shingles Vaccine? Yes   Zostavax completed No   Shingrix  Completed?: No.    Education has been provided regarding the importance of this vaccine. Patient has been advised to call insurance company to determine out of pocket expense if they have not yet received this vaccine. Advised may also receive vaccine at local pharmacy or Health Dept. Verbalized acceptance and understanding.  Screening Tests Health Maintenance  Topic Date Due   Zoster Vaccines- Shingrix (1 of 2) Never done   COVID-19 Vaccine (4 - Booster for Pfizer series) 02/18/2021   INFLUENZA VACCINE  06/29/2021   TETANUS/TDAP  04/15/2024   Hepatitis C Screening  Completed   HPV VACCINES  Aged Out    Health Maintenance  Health Maintenance Due  Topic Date Due   Zoster Vaccines- Shingrix (1 of 2) Never done   COVID-19 Vaccine (4 - Booster for Pfizer series) 02/18/2021   INFLUENZA VACCINE  06/29/2021    Colorectal cancer screening: No longer required.   Lung Cancer Screening: (Low Dose CT Chest recommended if Age 34-80 years, 30 pack-year currently smoking OR have quit w/in 15years.) does not qualify.   Lung Cancer Screening Referral: N/A  Additional Screening:  Hepatitis C Screening: does qualify; Completed Yes  Vision Screening: Recommended annual ophthalmology exams for early detection of glaucoma and other disorders of the eye. Is the patient up to date with their annual eye exam?  No  Who is the provider or what is the name of the office in which the patient attends annual eye exams? Office near [employer] If pt is not established with a provider, would they like to be referred to a provider to establish care? No .   Dental Screening: Recommended annual dental exams for proper oral hygiene  Community Resource Referral / Chronic Care Management: CRR required this visit?  No   CCM required this visit?  No       Plan:     I have personally reviewed and noted the following in the patient's chart:   Medical and social history Use of alcohol, tobacco or illicit drugs  Current medications and supplements including opioid prescriptions. Patient is not currently taking opioid prescriptions. Functional ability and status Nutritional status Physical activity Advanced directives List of other physicians Hospitalizations, surgeries, and ER visits in previous 12 months Vitals Screenings to include cognitive, depression, and falls Referrals and appointments  In addition, I have reviewed and discussed with patient certain preventive protocols, quality metrics, and best practice recommendations. A written personalized care plan for preventive services as well as general preventive health recommendations were provided to patient.     Rennis Harding, RN   08/15/2021   Nurse Notes:  Non-Face to Face 40 minutes visit encounter   Vincent Stevens , Thank you for taking time to come for your Medicare Wellness Visit. I appreciate your ongoing commitment to your health goals. Please review the following plan we discussed and let me know if I can assist you in the future.   These are the goals we discussed:  Goals   None     This is a list of the screening recommended for you and due dates:  Health Maintenance  Topic Date Due   Zoster (Shingles) Vaccine (1 of 2) Never done   COVID-19 Vaccine (4 - Booster for Pfizer series) 02/18/2021   Flu Shot  06/29/2021   Tetanus Vaccine  04/15/2024   Hepatitis C Screening: USPSTF Recommendation to screen - Ages 18-79 yo.  Completed   HPV Vaccine  Aged Out

## 2021-09-09 ENCOUNTER — Other Ambulatory Visit: Payer: Self-pay | Admitting: Cardiology

## 2021-10-19 DIAGNOSIS — L814 Other melanin hyperpigmentation: Secondary | ICD-10-CM | POA: Diagnosis not present

## 2021-10-19 DIAGNOSIS — L821 Other seborrheic keratosis: Secondary | ICD-10-CM | POA: Diagnosis not present

## 2021-10-19 DIAGNOSIS — Z08 Encounter for follow-up examination after completed treatment for malignant neoplasm: Secondary | ICD-10-CM | POA: Diagnosis not present

## 2021-10-19 DIAGNOSIS — Z85820 Personal history of malignant melanoma of skin: Secondary | ICD-10-CM | POA: Diagnosis not present

## 2021-10-19 DIAGNOSIS — D225 Melanocytic nevi of trunk: Secondary | ICD-10-CM | POA: Diagnosis not present

## 2021-10-19 DIAGNOSIS — L570 Actinic keratosis: Secondary | ICD-10-CM | POA: Diagnosis not present

## 2021-10-19 DIAGNOSIS — L819 Disorder of pigmentation, unspecified: Secondary | ICD-10-CM | POA: Diagnosis not present

## 2021-10-19 DIAGNOSIS — Z85828 Personal history of other malignant neoplasm of skin: Secondary | ICD-10-CM | POA: Diagnosis not present

## 2022-01-11 ENCOUNTER — Other Ambulatory Visit: Payer: Self-pay | Admitting: Family Medicine

## 2022-04-09 ENCOUNTER — Telehealth: Payer: Self-pay | Admitting: Cardiology

## 2022-04-09 NOTE — Telephone Encounter (Signed)
Spoke with pt and for about 2 weeks has noted change in activity level  with little exertion has to take a break and also B/P crept up this week with reading of 170/90 this was the highest reading this week Pt notes right sided collar bone pain feels like " when eating ice cream fast cold and hot sensation" this goes away with rest Per pt prior to cath had SOB  does note some SOB with exertion .Instructed pt to go to ED for eval and tx if has worsening symptoms Will forward to Dr Bettina Gavia for review and recommendations ./cy ?

## 2022-04-09 NOTE — Telephone Encounter (Signed)
Pt c/o BP issue: STAT if pt c/o blurred vision, one-sided weakness or slurred speech ? ?1. What are your last 5 BP readings?  ?170/90 ? ?2. Are you having any other symptoms (ex. Dizziness, headache, blurred vision, passed out)? no ? ?3. What is your BP issue? Patient said any time he exerts himself (even walking to the mailbox and back) he has to take a break ?

## 2022-04-09 NOTE — Telephone Encounter (Signed)
Spoke with pt and will continue to monitor and will call back if needs to make a sooner appt than August Pt will also log some B/P and will forward those via mychart in about 7-10 days ./cy ?

## 2022-04-19 DIAGNOSIS — Z08 Encounter for follow-up examination after completed treatment for malignant neoplasm: Secondary | ICD-10-CM | POA: Diagnosis not present

## 2022-04-19 DIAGNOSIS — L570 Actinic keratosis: Secondary | ICD-10-CM | POA: Diagnosis not present

## 2022-04-19 DIAGNOSIS — Z85820 Personal history of malignant melanoma of skin: Secondary | ICD-10-CM | POA: Diagnosis not present

## 2022-04-19 DIAGNOSIS — D1801 Hemangioma of skin and subcutaneous tissue: Secondary | ICD-10-CM | POA: Diagnosis not present

## 2022-04-19 DIAGNOSIS — L578 Other skin changes due to chronic exposure to nonionizing radiation: Secondary | ICD-10-CM | POA: Diagnosis not present

## 2022-05-04 ENCOUNTER — Telehealth: Payer: Self-pay | Admitting: Cardiology

## 2022-05-04 NOTE — Telephone Encounter (Signed)
Patient was calling bout his [facility] paperwork that was suppose to be filled out. Please advise

## 2022-05-06 ENCOUNTER — Telehealth: Payer: Self-pay | Admitting: Cardiology

## 2022-05-06 NOTE — Telephone Encounter (Signed)
Pt calling back to f/u on DOT paperwork that was dropped off at office to be filled out. Pt states that it ran out yesterday and really needs that paper work in order for him to work. Please advise

## 2022-05-07 NOTE — Telephone Encounter (Signed)
Called patient and informed him that Dr. Bettina Gavia has not signed his DOT form and explained that I would bring it to his attention today. Patient stated that he would come by the office in [facility] on Tuesday (6/13) to pick up the form. I explained that we would do our best to have the form ready for him to pick up on 6/13. Patient had no further questions at this time.

## 2022-05-25 NOTE — Telephone Encounter (Signed)
Called patient and updated him on the status of his DOT paperwork. Patient was agreeable with the plan and had no further questions.

## 2022-06-11 ENCOUNTER — Telehealth: Payer: Self-pay | Admitting: Cardiology

## 2022-06-11 NOTE — Telephone Encounter (Signed)
Patient states he spoke with Delfino Lovett, RN regarding moving up 7/27 appointment with Dr. Bettina Gavia for DOT Physical. He states the RN informed him that Dr. Bettina Gavia was on vacation at the time and RN would follow up to let him know if he can be seen any sooner. Please advise.

## 2022-06-23 NOTE — Progress Notes (Unsigned)
Cardiology Office Note:    Date:  06/24/2022   [name], DOB 02-06-1946, MRN [id number]  PCP:  Vivi Barrack, MD  Cardiologist:  Shirlee More, MD    Referring MD: Vivi Barrack, MD    ASSESSMENT:    1. Coronary artery disease involving native coronary artery of native heart with angina pectoris ([place])   2. Hx of CABG   3. Dyslipidemia   4. Essential hypertension    PLAN:    In order of problems listed above:  He continues to do well after surgical revascularization for CAD, asymptomatic New York Heart Association class I continue medical therapy including aspirin his statin and ARB and is on no rate slowing medications and is asymptomatic with his atrial arrhythmia.  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 Check lipid profile liver function his last lipid profile 06/30/2021 had an LDL 87 cholesterol 156 A1c 6.1 creatinine 1.03 potassium 4.7 we will recheck those labs and continue a statin Stable CAD continue current treatment unfortunately as of first in the morning visit and I will ask him to trend and record blood pressures at home for his next visit   Next appointment: Cardiology 1 year   Medication Adjustments/Labs and Tests Ordered: Current medicines are reviewed at length with the patient today.  Concerns regarding medicines are outlined above.  No orders of the defined types were placed in this encounter.  No orders of the defined types were placed in this encounter.   CAD follow-up bypass surgery 2019   History of Present Illness:    Vincent Stevens is a 76 y.o. male with a hx of CAD with CABG in 2019 and hyper lipidemia last seen 06/30/2021 with prominent complaint of palpitation. He is an event monitor for 1 week reported 07/19/2021 which showed occasional supraventricular ectopy with symptomatic APCs and brief episodes of atrial tachycardia longest and fastest 22 complexes  at 125 bpm. There were no sinus node pauses or episodes of second or third-degree AV block.  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 and is having no symptoms of palpitation lightheadedness syncope shortness of breath chest pain or palpitation He tolerates his statin without muscle pain or weakness His EKG shows atrial bigeminy normal conduction He had carries a form for CDL and says that the examiner was concerned about irregular heart rhythm he does not have atrial fibrillation I reviewed his EKG pattern unchanged from 10/20/2020  Compliance with diet, lifestyle and medications: Yes Past Medical History:  Diagnosis Date   Allergy    Angina, class III ([place]) 03/01/2018   BPH (benign prostatic hyperplasia) 09/19/2009   Qualifier: Diagnosis of  By: Burnice Logan  MD, Doretha Sou    Coronary artery disease involving native coronary artery of native heart with angina pectoris ([place])    Dyslipidemia 08/14/2010   Qualifier: Diagnosis of  By: Burnice Logan  MD, Doretha Sou    Essential hypertension 01/17/2009   Qualifier: Diagnosis of  By: Burnice Logan  MD, Doretha Sou    Gout    History of colonic polyps 09/27/2008   Qualifier: Diagnosis of  By: Burnice Logan  MD, Doretha Sou  Initial colonoscopy 2003.  Hyperplastic polyps only Followup colonoscopy 2008.  Normal  10 year interval recommended    Hx of CABG 03/01/2018   Impaired glucose tolerance 02/20/2013   Left main coronary artery disease    Melanoma ([place])  Pain in both lower extremities 08/15/2018    Past Surgical History:  Procedure Laterality Date   CORONARY ARTERY BYPASS GRAFT N/A 03/01/2018   Procedure: CORONARY ARTERY BYPASS GRAFTING (CABG) times three using the right saphaneous vein. Harvested endoscopicly; and left internal mammary artery.;  Surgeon: Gaye Pollack, MD;  Location: [facility];  Service: Open Heart Surgery;  Laterality: N/A;   HERNIA REPAIR     LEFT HEART CATH AND CORONARY ANGIOGRAPHY N/A 03/01/2018   Procedure: LEFT HEART CATH  AND CORONARY ANGIOGRAPHY;  Surgeon: Leonie Man, MD;  Location: [place] [facility];  Service: Cardiovascular;  Laterality: N/A;   melanoma removal     TEE WITHOUT CARDIOVERSION N/A 03/01/2018   Procedure: TRANSESOPHAGEAL ECHOCARDIOGRAM (TEE);  Surgeon: Gaye Pollack, MD;  Location: [place];  Service: Open Heart Surgery;  Laterality: N/A;    Current Medications: Current Meds  Medication Sig   aspirin EC 81 MG tablet Take 1 tablet (81 mg total) by mouth daily.   ibuprofen (ADVIL,MOTRIN) 200 MG tablet Take 200 mg by mouth 2 (two) times daily as needed (arthritis apin).    losartan (COZAAR) 50 MG tablet Take 50 mg by mouth daily.   Multiple Vitamin (MULTIVITAMIN WITH MINERALS) TABS tablet Take 1 tablet by mouth at bedtime.   simvastatin (ZOCOR) 80 MG tablet Take 1 tablet (80 mg total) by mouth daily.   tamsulosin (FLOMAX) 0.4 MG CAPS capsule TAKE 1 CAPSULE EVERY DAY     Allergies:   Sulfamethoxazole   Social History   Socioeconomic History   Marital status: Married    Spouse name: Not on file   Number of children: 1   Years of education: Not on file   Highest education level: Not on file  Occupational History   Occupation: Courier     Comment: Part time   Tobacco Use   Smoking status: Former    Types: Cigarettes    Quit date: 11/29/1980    Years since quitting: 41.5   Smokeless tobacco: Never  Vaping Use   Vaping Use: Never used  Substance and Sexual Activity   Alcohol use: No   Drug use: No   Sexual activity: Not on file  Other Topics Concern   Not on file  Social History Narrative   Not on file   Social Determinants of Health   Financial Resource Strain: Low Risk  (08/15/2021)   Overall Financial Resource Strain (CARDIA)    Difficulty of Paying Living Expenses: Not hard at all  Food Insecurity: No Food Insecurity (08/15/2021)   Hunger Vital Sign    Worried About Running Out of Food in the Last Year: Never true    [place] in the Last Year: Never true   Transportation Needs: No Transportation Needs (08/15/2021)   PRAPARE - Hydrologist (Medical): No    Lack of Transportation (Non-Medical): No  Physical Activity: Inactive (08/15/2021)   Exercise Vital Sign    Days of Exercise per Week: 0 days    Minutes of Exercise per Session: 0 min  Stress: No Stress Concern Present (08/15/2021)   Vincent Stevens    Feeling of Stress : Not at all  Social Connections: Moderately Isolated (08/15/2021)   Social Connection and Isolation Panel [NHANES]    Frequency of Communication with Friends and Family: More than three times a week    Frequency of Social Gatherings with Friends and Family: Once a  week    Attends Religious Services: Never    Active Member of Clubs or Organizations: No    Attends Music therapist: Never    Marital Status: Married     Family History: The patient's family history includes Cancer in his sister; Dementia in his mother; Diabetes in an other family member; Heart attack in his father; Hypertension in an other family member. ROS:   Please see the history of present illness.    All other systems reviewed and are negative.  EKGs/Labs/Other Studies Reviewed:    The following studies were reviewed today:  EKG:  EKG ordered today and personally reviewed.  The ekg ordered today demonstrates sinus rhythm LVH voltage atrial bigeminy normal AV nodal conduction old anterior septal MI pattern is unchanged from his baseline  Recent Labs: 06/30/2021: ALT 16; BUN 14; Creatinine, Ser 1.03; Potassium 4.7; Sodium 141  Recent Lipid Panel    Component Value Date/Time   CHOL 156 06/30/2021 0905   TRIG 102 06/30/2021 0905   HDL 50 06/30/2021 0905   CHOLHDL 3.1 06/30/2021 0905   CHOLHDL 3 12/08/2020 1023   VLDL 18.0 12/08/2020 1023   LDLCALC 87 06/30/2021 0905    Physical Exam:    VS:  Pulse (!) 56   Ht 5' 9.6" (1.768 m)   Wt 196 lb  1.3 oz (88.9 kg)   SpO2 99%   BMI 28.46 kg/m    Blood pressure by me 146/80 Wt Readings from Last 3 Encounters:  06/24/22 196 lb 1.3 oz (88.9 kg)  06/30/21 201 lb 1.3 oz (91.2 kg)  12/23/20 200 lb (90.7 kg)     GEN:  Well nourished, well developed in no acute distress HEENT: Normal NECK: No JVD; No carotid bruits LYMPHATICS: No lymphadenopathy CARDIAC: RRR, no murmurs, rubs, gallops RESPIRATORY:  Clear to auscultation without rales, wheezing or rhonchi  ABDOMEN: Soft, non-tender, non-distended MUSCULOSKELETAL:  No edema; No deformity  SKIN: Warm and dry NEUROLOGIC:  Alert and oriented x 3 PSYCHIATRIC:  Normal affect    Signed, Shirlee More, MD  06/24/2022 8:14 AM    [place]

## 2022-06-24 ENCOUNTER — Encounter: Payer: Self-pay | Admitting: Cardiology

## 2022-06-24 ENCOUNTER — Ambulatory Visit: Payer: Medicare HMO | Admitting: Cardiology

## 2022-06-24 ENCOUNTER — Other Ambulatory Visit: Payer: Self-pay

## 2022-06-24 VITALS — BP 146/80 | HR 56 | Ht 69.6 in | Wt 196.1 lb

## 2022-06-24 DIAGNOSIS — Z951 Presence of aortocoronary bypass graft: Secondary | ICD-10-CM | POA: Diagnosis not present

## 2022-06-24 DIAGNOSIS — I10 Essential (primary) hypertension: Secondary | ICD-10-CM | POA: Diagnosis not present

## 2022-06-24 DIAGNOSIS — I25119 Atherosclerotic heart disease of native coronary artery with unspecified angina pectoris: Secondary | ICD-10-CM | POA: Diagnosis not present

## 2022-06-24 DIAGNOSIS — E785 Hyperlipidemia, unspecified: Secondary | ICD-10-CM | POA: Diagnosis not present

## 2022-06-24 NOTE — Patient Instructions (Signed)
Medication Instructions:  Your physician recommends that you continue on your current medications as directed. Please refer to the Current Medication list given to you today.  *If you need a refill on your cardiac medications before your next appointment, please call your pharmacy*   Lab Work: Your physician recommends that you return for lab work in:   Labs today: CMP, Lipids  If you have labs (blood work) drawn today and your tests are completely normal, you will receive your results only by: [place] (if you have [place]) OR A paper copy in the mail If you have any lab test that is abnormal or we need to change your treatment, we will call you to review the results.   Testing/Procedures: None   Follow-Up: At [facility], you and your health needs are our priority.  As part of our continuing mission to provide you with exceptional heart care, we have created designated Provider Care Teams.  These Care Teams include your primary Cardiologist (physician) and Advanced Practice Providers (APPs -  Physician Assistants and Nurse Practitioners) who all work together to provide you with the care you need, when you need it.  We recommend signing up for the patient portal called "MyChart".  Sign up information is provided on this After Visit Summary.  MyChart is used to connect with patients for Virtual Visits (Telemedicine).  Patients are able to view lab/test results, encounter notes, upcoming appointments, etc.  Non-urgent messages can be sent to your provider as well.   To learn more about what you can do with MyChart, go to [email or web address].    Your next appointment:   1 year(s)  The format for your next appointment:   In Person  Provider:   Shirlee More, MD    Other Instructions None  Important Information About Sugar

## 2022-06-25 ENCOUNTER — Telehealth: Payer: Self-pay

## 2022-06-25 LAB — COMPREHENSIVE METABOLIC PANEL
ALT: 20 IU/L (ref 0–44)
AST: 27 IU/L (ref 0–40)
Albumin/Globulin Ratio: 2.5 — ABNORMAL HIGH (ref 1.2–2.2)
Albumin: 4.7 g/dL (ref 3.8–4.8)
Alkaline Phosphatase: 64 IU/L (ref 44–121)
BUN/Creatinine Ratio: 14 (ref 10–24)
BUN: 15 mg/dL (ref 8–27)
Bilirubin Total: 0.6 mg/dL (ref 0.0–1.2)
CO2: 25 mmol/L (ref 20–29)
Calcium: 9.6 mg/dL (ref 8.6–10.2)
Chloride: 102 mmol/L (ref 96–106)
Creatinine, Ser: 1.04 mg/dL (ref 0.76–1.27)
Globulin, Total: 1.9 g/dL (ref 1.5–4.5)
Glucose: 101 mg/dL — ABNORMAL HIGH (ref 70–99)
Potassium: 5 mmol/L (ref 3.5–5.2)
Sodium: 140 mmol/L (ref 134–144)
Total Protein: 6.6 g/dL (ref 6.0–8.5)
eGFR: 74 mL/min/{1.73_m2} (ref >59)

## 2022-06-25 LAB — LIPID PANEL
Chol/HDL Ratio: 2.7 ratio (ref 0.0–5.0)
Cholesterol, Total: 138 mg/dL (ref 100–199)
HDL: 52 mg/dL (ref >39)
LDL Chol Calc (NIH): 69 mg/dL (ref 0–99)
Triglycerides: 87 mg/dL (ref 0–149)
VLDL Cholesterol Cal: 17 mg/dL (ref 5–40)

## 2022-06-25 NOTE — Telephone Encounter (Signed)
Patient had an appointment with Dr. 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 After the DOT paperwork was completed he had no further questions at this time.

## 2022-06-25 NOTE — Telephone Encounter (Signed)
Patient notified of results.

## 2022-06-25 NOTE — Telephone Encounter (Signed)
-----   Message from Richardo Priest, MD sent at 06/25/2022  8:45 AM EDT ----- Result are good and no changes

## 2022-08-23 ENCOUNTER — Encounter: Payer: Self-pay | Admitting: *Deleted

## 2022-08-30 ENCOUNTER — Other Ambulatory Visit: Payer: Self-pay | Admitting: Cardiology

## 2022-09-08 ENCOUNTER — Ambulatory Visit (INDEPENDENT_AMBULATORY_CARE_PROVIDER_SITE_OTHER): Payer: Medicare HMO

## 2022-09-08 DIAGNOSIS — Z23 Encounter for immunization: Secondary | ICD-10-CM | POA: Diagnosis not present

## 2022-09-09 ENCOUNTER — Ambulatory Visit (INDEPENDENT_AMBULATORY_CARE_PROVIDER_SITE_OTHER): Payer: Medicare HMO | Admitting: Family Medicine

## 2022-09-09 ENCOUNTER — Encounter: Payer: Self-pay | Admitting: Family Medicine

## 2022-09-09 VITALS — BP 147/72 | HR 55 | Temp 98.0°F | Ht 69.6 in | Wt 193.2 lb

## 2022-09-09 DIAGNOSIS — K219 Gastro-esophageal reflux disease without esophagitis: Secondary | ICD-10-CM | POA: Insufficient documentation

## 2022-09-09 DIAGNOSIS — I10 Essential (primary) hypertension: Secondary | ICD-10-CM

## 2022-09-09 DIAGNOSIS — N400 Enlarged prostate without lower urinary tract symptoms: Secondary | ICD-10-CM | POA: Diagnosis not present

## 2022-09-09 DIAGNOSIS — E785 Hyperlipidemia, unspecified: Secondary | ICD-10-CM

## 2022-09-09 DIAGNOSIS — Z0001 Encounter for general adult medical examination with abnormal findings: Secondary | ICD-10-CM | POA: Diagnosis not present

## 2022-09-09 DIAGNOSIS — R739 Hyperglycemia, unspecified: Secondary | ICD-10-CM

## 2022-09-09 HISTORY — DX: Gastro-esophageal reflux disease without esophagitis: K21.9

## 2022-09-09 LAB — COMPREHENSIVE METABOLIC PANEL
ALT: 18 U/L (ref 0–53)
AST: 22 U/L (ref 0–37)
Albumin: 4.1 g/dL (ref 3.5–5.2)
Alkaline Phosphatase: 50 U/L (ref 39–117)
BUN: 14 mg/dL (ref 6–23)
CO2: 30 mEq/L (ref 19–32)
Calcium: 9.2 mg/dL (ref 8.4–10.5)
Chloride: 103 mEq/L (ref 96–112)
Creatinine, Ser: 1.09 mg/dL (ref 0.40–1.50)
GFR: 65.42 mL/min (ref >60.00)
Glucose, Bld: 100 mg/dL — ABNORMAL HIGH (ref 70–99)
Potassium: 4.6 mEq/L (ref 3.5–5.1)
Sodium: 139 mEq/L (ref 135–145)
Total Bilirubin: 0.8 mg/dL (ref 0.2–1.2)
Total Protein: 6.9 g/dL (ref 6.0–8.3)

## 2022-09-09 LAB — CBC
HCT: 46 % (ref 39.0–52.0)
Hemoglobin: 15.3 g/dL (ref 13.0–17.0)
MCHC: 33.3 g/dL (ref 30.0–36.0)
MCV: 87.7 fl (ref 78.0–100.0)
Platelets: 210 10*3/uL (ref 150.0–400.0)
RBC: 5.25 Mil/uL (ref 4.22–5.81)
RDW: 14.3 % (ref 11.5–15.5)
WBC: 6.3 10*3/uL (ref 4.0–10.5)

## 2022-09-09 LAB — LIPID PANEL
Cholesterol: 135 mg/dL (ref 0–200)
HDL: 46.8 mg/dL (ref >39.00)
LDL Cholesterol: 70 mg/dL (ref 0–99)
NonHDL: 88.26
Total CHOL/HDL Ratio: 3
Triglycerides: 92 mg/dL (ref 0.0–149.0)
VLDL: 18.4 mg/dL (ref 0.0–40.0)

## 2022-09-09 LAB — HEMOGLOBIN A1C: Hgb A1c MFr Bld: 6.3 % (ref 4.6–6.5)

## 2022-09-09 LAB — TSH: TSH: 1.81 u[IU]/mL (ref 0.35–5.50)

## 2022-09-09 MED ORDER — PANTOPRAZOLE SODIUM 40 MG PO TBEC: 40.0000 mg | DELAYED_RELEASE_TABLET | Freq: Every day | ORAL | 3 refills | Status: DC

## 2022-09-09 NOTE — Progress Notes (Addendum)
Chief Complaint:  Vincent Stevens is a 76 y.o. male who presents today for his annual comprehensive physical exam.    Assessment/Plan:  Chronic Problems Addressed Today: BPH (benign prostatic hyperplasia) Stable on Flomax 0.4 mg daily.  Essential hypertension Slightly elevated today.  Has typically been well controlled.  We will continue losartan 50 mg daily.  He can continue home monitoring.  He will let us know if persistently elevated.  Dyslipidemia Check lipids.  He is on simvastatin 80 mg daily.  GERD (gastroesophageal reflux disease) Symptoms have been worsening recently.  Tums helps alleviate symptoms however still has frequent recurrences depending on what he eats.  We will try course of Protonix 40 mg daily for a few weeks.  He will let me know if not improving.  If continues to be an would consider referral to GI.  Preventative Healthcare: Check labs.  Can update on flu vaccine.  He can get shingles vaccine at the pharmacy.  Patient Counseling(The following topics were reviewed and/or handout was given):  -Nutrition: Stressed importance of moderation in sodium/caffeine intake, saturated fat and cholesterol, caloric balance, sufficient intake of fresh fruits, vegetables, and fiber.  -Stressed the importance of regular exercise.   -Substance Abuse: Discussed cessation/primary prevention of tobacco, alcohol, or other drug use; driving or other dangerous activities under the influence; availability of treatment for abuse.   -Injury prevention: Discussed safety belts, safety helmets, smoke detector, smoking near bedding or upholstery.   -Sexuality: Discussed sexually transmitted diseases, partner selection, use of condoms, avoidance of unintended pregnancy and contraceptive alternatives.   -Dental health: Discussed importance of regular tooth brushing, flossing, and dental visits.  -Health maintenance and immunizations reviewed. Please refer to Health maintenance  section.  Return to care in 1 year for next preventative visit.     Subjective:  HPI:  He has no acute complaints today. See A/p for status of chronic conditions.   He has been having worsening issues with acid reflux for the last few months.  Usually occurs after eating things that typically cause flareups.  He will take Tums or other antacid medications with relief in symptoms within about 5 minutes or so.  No associated shortness of breath.  No associated nausea or vomiting.  No symptoms worsening with exertion.  Lifestyle Diet: None specific. Trying to get plenty of fruits and vegetables.  Exercise: None specific.      09/09/2022   12:56 PM  Depression screen PHQ 2/9  Decreased Interest 0  Down, Depressed, Hopeless 0  PHQ - 2 Score 0   ROS: Per HPI, otherwise a complete review of systems was negative.   PMH:  The following were reviewed and entered/updated in epic: Past Medical History:  Diagnosis Date   Allergy    Angina, class III ([place]) 03/01/2018   BPH (benign prostatic hyperplasia) 09/19/2009   Qualifier: Diagnosis of  By: Burnice Logan  MD, Doretha Sou    Coronary artery disease involving native coronary artery of native heart with angina pectoris ([place])    Dyslipidemia 08/14/2010   Qualifier: Diagnosis of  By: Burnice Logan  MD, Doretha Sou    Essential hypertension 01/17/2009   Qualifier: Diagnosis of  By: Burnice Logan  MD, Doretha Sou    Gout    History of colonic polyps 09/27/2008   Qualifier: Diagnosis of  By: Burnice Logan  MD, Doretha Sou  Initial colonoscopy 2003.  Hyperplastic polyps only Followup colonoscopy 2008.  Normal  10 year interval recommended    Hx of CABG 03/01/2018  Impaired glucose tolerance 02/20/2013   Left main coronary artery disease    Melanoma (HCC)    Stevens in both lower extremities 08/15/2018   Patient Active Problem List   Diagnosis Date Noted   GERD (gastroesophageal reflux disease) 09/09/2022   Allergy 06/17/2021   Stevens in both lower extremities  08/15/2018   Gout    Melanoma ([place])    Angina, class III ([place]) 03/01/2018   Hx of CABG 03/01/2018   Coronary artery disease involving native coronary artery of native heart with angina pectoris ([place])    Impaired glucose tolerance 02/20/2013   Dyslipidemia 08/14/2010   GOUT, ACUTE 11/07/2009   BPH (benign prostatic hyperplasia) 09/19/2009   Essential hypertension 01/17/2009   History of colonic polyps 09/27/2008   Past Surgical History:  Procedure Laterality Date   CORONARY ARTERY BYPASS GRAFT N/A 03/01/2018   Procedure: CORONARY ARTERY BYPASS GRAFTING (CABG) times three using the right saphaneous vein. Harvested endoscopicly; and left internal mammary artery.;  Surgeon: Gaye Pollack, MD;  Location: [facility] OR;  Service: Open Heart Surgery;  Laterality: N/A;   HERNIA REPAIR     LEFT HEART CATH AND CORONARY ANGIOGRAPHY N/A 03/01/2018   Procedure: LEFT HEART CATH AND CORONARY ANGIOGRAPHY;  Surgeon: Leonie Man, MD;  Location: [place] [facility];  Service: Cardiovascular;  Laterality: N/A;   melanoma removal     TEE WITHOUT CARDIOVERSION N/A 03/01/2018   Procedure: TRANSESOPHAGEAL ECHOCARDIOGRAM (TEE);  Surgeon: Gaye Pollack, MD;  Location: [place];  Service: Open Heart Surgery;  Laterality: N/A;    Family History  Problem Relation Age of Onset   Dementia Mother    Heart attack Father    Cancer Sister        Skin   Diabetes Other    Hypertension Other     Medications- reviewed and updated Current Outpatient Medications  Medication Sig Dispense Refill   aspirin EC 81 MG tablet Take 1 tablet (81 mg total) by mouth daily.     ibuprofen (ADVIL,MOTRIN) 200 MG tablet Take 200 mg by mouth 2 (two) times daily as needed (arthritis apin).      losartan (COZAAR) 50 MG tablet TAKE 1 TABLET EVERY DAY 90 tablet 1   Multiple Vitamin (MULTIVITAMIN WITH MINERALS) TABS tablet Take 1 tablet by mouth at bedtime.     pantoprazole (PROTONIX) 40 MG tablet Take 1 tablet (40 mg total) by mouth daily. 30  tablet 3   simvastatin (ZOCOR) 80 MG tablet Take 1 tablet (80 mg total) by mouth daily. 90 tablet 2   tamsulosin (FLOMAX) 0.4 MG CAPS capsule TAKE 1 CAPSULE EVERY DAY 90 capsule 3   No current facility-administered medications for this visit.    Allergies-reviewed and updated Allergies  Allergen Reactions   Sulfamethoxazole Hives and Itching    Social History   Socioeconomic History   Marital status: Married    Spouse name: Not on file   Number of children: 1   Years of education: Not on file   Highest education level: Not on file  Occupational History   Occupation: Courier     Comment: Part time   Tobacco Use   Smoking status: Former    Types: Cigarettes    Quit date: 11/29/1980    Years since quitting: 41.8   Smokeless tobacco: Never  Vaping Use   Vaping Use: Never used  Substance and Sexual Activity   Alcohol use: No   Drug use: No   Sexual activity: Not on file  Other Topics Concern   Not on file  Social History Narrative   Not on file   Social Determinants of Health   Financial Resource Strain: Low Risk  (08/15/2021)   Overall Financial Resource Strain (CARDIA)    Difficulty of Paying Living Expenses: Not hard at all  Food Insecurity: No Food Insecurity (08/15/2021)   Hunger Vital Sign    Worried About Running Out of Food in the Last Year: Never true    Ran Out of Food in the Last Year: Never true  Transportation Needs: No Transportation Needs (08/15/2021)   PRAPARE - Hydrologist (Medical): No    Lack of Transportation (Non-Medical): No  Physical Activity: Inactive (08/15/2021)   Exercise Vital Sign    Days of Exercise per Week: 0 days    Minutes of Exercise per Session: 0 min  Stress: No Stress Concern Present (08/15/2021)   [place]    Feeling of Stress : Not at all  Social Connections: Moderately Isolated (08/15/2021)   Social Connection and Isolation Panel  [NHANES]    Frequency of Communication with Friends and Family: More than three times a week    Frequency of Social Gatherings with Friends and Family: Once a week    Attends Religious Services: Never    Marine scientist or Organizations: No    Attends Music therapist: Never    Marital Status: Married        Objective:  Physical Exam: BP (!) 147/72   Pulse (!) 55   Temp 98 F (36.7 C) (Temporal)   Ht 5' 9.6" (1.768 m)   Wt 193 lb 3.2 oz (87.6 kg)   SpO2 98%   BMI 28.04 kg/m   Body mass index is 28.04 kg/m. Wt Readings from Last 3 Encounters:  09/09/22 193 lb 3.2 oz (87.6 kg)  06/24/22 196 lb 1.3 oz (88.9 kg)  06/30/21 201 lb 1.3 oz (91.2 kg)   Gen: NAD, resting comfortably HEENT: TMs normal bilaterally. OP clear. No thyromegaly noted.  CV: RRR with no murmurs appreciated Pulm: NWOB, CTAB with no crackles, wheezes, or rhonchi GI: Normal bowel sounds present. Soft, Nontender, Nondistended. MSK: no edema, cyanosis, or clubbing noted Skin: warm, dry Neuro: CN2-12 grossly intact. Strength 5/5 in upper and lower extremities. Reflexes symmetric and intact bilaterally.  Psych: Normal affect and thought content     Vincent [place] M. Jerline Pain, MD 09/09/2022 1:27 PM

## 2022-09-09 NOTE — Assessment & Plan Note (Addendum)
Symptoms have been worsening recently.  Tums helps alleviate symptoms however still has frequent recurrences depending on what he eats.  We will try course of Protonix 40 mg daily for a few weeks.  He will let me know if not improving.  If continues to be an would consider referral to GI.

## 2022-09-09 NOTE — Patient Instructions (Signed)
It was very nice to see you today!  Please try the Protonix.  Let me know how this is working for you in the next few weeks.  We will check blood work today.  Please continue to work on diet and exercise.  I will see back in year for your next physical.  Come back sooner if needed.  Take care, Dr Jerline Pain  PLEASE NOTE:  If you had any lab tests please let us know if you have not heard back within a few days. You may see your results on mychart before we have a chance to review them but we will give you a call once they are reviewed by [place]. If we ordered any referrals today, please let us know if you have not heard from their office within the next week.   Please try these tips to maintain a healthy lifestyle:  Eat at least 3 REAL meals and 1-2 snacks per day.  Aim for no more than 5 hours between eating.  If you eat breakfast, please do so within one hour of getting up.   Each meal should contain half fruits/vegetables, one quarter protein, and one quarter carbs (no bigger than a computer mouse)  Cut down on sweet beverages. This includes juice, soda, and sweet tea.   Drink at least 1 glass of water with each meal and aim for at least 8 glasses per day  Exercise at least 150 minutes every week.    Preventive Care 70 Years and Older, Male Preventive care refers to lifestyle choices and visits with your health care provider that can promote health and wellness. Preventive care visits are also called wellness exams. What can I expect for my preventive care visit? Counseling During your preventive care visit, your health care provider may ask about your: Medical history, including: Past medical problems. Family medical history. History of falls. Current health, including: Emotional well-being. Home life and relationship well-being. Sexual activity. Memory and ability to understand (cognition). Lifestyle, including: Alcohol, nicotine or tobacco, and drug use. Access to  firearms. Diet, exercise, and sleep habits. Work and work Statistician. Sunscreen use. Safety issues such as seatbelt and bike helmet use. Physical exam Your health care provider will check your: Height and weight. These may be used to calculate your BMI (body mass index). BMI is a measurement that tells if you are at a healthy weight. Waist circumference. This measures the distance around your waistline. This measurement also tells if you are at a healthy weight and may help predict your risk of certain diseases, such as type 2 diabetes and high blood pressure. Heart rate and blood pressure. Body temperature. Skin for abnormal spots. What immunizations do I need?  Vaccines are usually given at various ages, according to a schedule. Your health care provider will recommend vaccines for you based on your age, medical history, and lifestyle or other factors, such as travel or where you work. What tests do I need? Screening Your health care provider may recommend screening tests for certain conditions. This may include: Lipid and cholesterol levels. Diabetes screening. This is done by checking your blood sugar (glucose) after you have not eaten for a while (fasting). Hepatitis C test. Hepatitis B test. HIV (human immunodeficiency virus) test. STI (sexually transmitted infection) testing, if you are at risk. Lung cancer screening. Colorectal cancer screening. Prostate cancer screening. Abdominal aortic aneurysm (AAA) screening. You may need this if you are a current or former smoker. Talk with your health care provider about  your test results, treatment options, and if necessary, the need for more tests. Follow these instructions at home: Eating and drinking  Eat a diet that includes fresh fruits and vegetables, whole grains, lean protein, and low-fat dairy products. Limit your intake of foods with high amounts of sugar, saturated fats, and salt. Take vitamin and mineral supplements as  recommended by your health care provider. Do not drink alcohol if your health care provider tells you not to drink. If you drink alcohol: Limit how much you have to 0-2 drinks a day. Know how much alcohol is in your drink. In the [place] one drink equals one 12 oz bottle of beer (355 mL), one 5 oz glass of wine (148 mL), or one 1 oz glass of hard liquor (44 mL). Lifestyle Brush your teeth every morning and night with fluoride toothpaste. Floss one time each day. Exercise for at least 30 minutes 5 or more days each week. Do not use any products that contain nicotine or tobacco. These products include cigarettes, chewing tobacco, and vaping devices, such as e-cigarettes. If you need help quitting, ask your health care provider. Do not use drugs. If you are sexually active, practice safe sex. Use a condom or other form of protection to prevent STIs. Take aspirin only as told by your health care provider. Make sure that you understand how much to take and what form to take. Work with your health care provider to find out whether it is safe and beneficial for you to take aspirin daily. Ask your health care provider if you need to take a cholesterol-lowering medicine (statin). Find healthy ways to manage stress, such as: Meditation, yoga, or listening to music. Journaling. Talking to a trusted person. Spending time with friends and family. Safety Always wear your seat belt while driving or riding in a vehicle. Do not drive: If you have been drinking alcohol. Do not ride with someone who has been drinking. When you are tired or distracted. While texting. If you have been using any mind-altering substances or drugs. Wear a helmet and other protective equipment during sports activities. If you have firearms in your house, make sure you follow all gun safety procedures. Minimize exposure to UV radiation to reduce your risk of skin cancer. What's next? Visit your health care provider once a year for  an annual wellness visit. Ask your health care provider how often you should have your eyes and teeth checked. Stay up to date on all vaccines. This information is not intended to replace advice given to you by your health care provider. Make sure you discuss any questions you have with your health care provider. Document Revised: 05/13/2021 Document Reviewed: 05/13/2021 Elsevier Patient Education  [employer].

## 2022-09-09 NOTE — Assessment & Plan Note (Signed)
Slightly elevated today.  Has typically been well controlled.  We will continue losartan 50 mg daily.  He can continue home monitoring.  He will let us know if persistently elevated.

## 2022-09-09 NOTE — Assessment & Plan Note (Signed)
Stable on Flomax 0.4 mg daily.

## 2022-09-09 NOTE — Assessment & Plan Note (Signed)
Check lipids.  He is on simvastatin 80 mg daily.

## 2022-09-13 ENCOUNTER — Encounter: Payer: Self-pay | Admitting: Family Medicine

## 2022-09-13 DIAGNOSIS — E088 Diabetes mellitus due to underlying condition with unspecified complications: Secondary | ICD-10-CM

## 2022-09-13 DIAGNOSIS — R7303 Prediabetes: Secondary | ICD-10-CM | POA: Insufficient documentation

## 2022-09-13 HISTORY — DX: Prediabetes: R73.03

## 2022-09-13 HISTORY — DX: Diabetes mellitus due to underlying condition with unspecified complications: E08.8

## 2022-09-13 NOTE — Progress Notes (Signed)
Please inform patient of the following:  His A1c is elevated into the prediabetic range. recommend starting metformin 500 mg daily to improve numbers and lower risk of progression to diabetes.  The rest of his labs are all stable.  I would like to see him back in about 6 months to recheck his A1c.

## 2022-09-14 ENCOUNTER — Other Ambulatory Visit: Payer: Self-pay

## 2022-09-14 MED ORDER — METFORMIN HCL 500 MG PO TABS: 500.0000 mg | ORAL_TABLET | Freq: Every day | ORAL | 1 refills | Status: DC

## 2022-09-23 ENCOUNTER — Other Ambulatory Visit: Payer: Self-pay | Admitting: Cardiology

## 2022-10-19 DIAGNOSIS — Z08 Encounter for follow-up examination after completed treatment for malignant neoplasm: Secondary | ICD-10-CM | POA: Diagnosis not present

## 2022-10-19 DIAGNOSIS — Z85820 Personal history of malignant melanoma of skin: Secondary | ICD-10-CM | POA: Diagnosis not present

## 2022-10-19 DIAGNOSIS — L814 Other melanin hyperpigmentation: Secondary | ICD-10-CM | POA: Diagnosis not present

## 2022-10-19 DIAGNOSIS — D225 Melanocytic nevi of trunk: Secondary | ICD-10-CM | POA: Diagnosis not present

## 2022-10-19 DIAGNOSIS — L905 Scar conditions and fibrosis of skin: Secondary | ICD-10-CM | POA: Diagnosis not present

## 2022-10-19 DIAGNOSIS — Z85828 Personal history of other malignant neoplasm of skin: Secondary | ICD-10-CM | POA: Diagnosis not present

## 2022-10-19 DIAGNOSIS — L570 Actinic keratosis: Secondary | ICD-10-CM | POA: Diagnosis not present

## 2022-10-19 DIAGNOSIS — L821 Other seborrheic keratosis: Secondary | ICD-10-CM | POA: Diagnosis not present

## 2022-11-01 ENCOUNTER — Other Ambulatory Visit: Payer: Self-pay | Admitting: Family Medicine

## 2023-01-04 DIAGNOSIS — Z0100 Encounter for examination of eyes and vision without abnormal findings: Secondary | ICD-10-CM | POA: Diagnosis not present

## 2023-01-04 DIAGNOSIS — H2513 Age-related nuclear cataract, bilateral: Secondary | ICD-10-CM | POA: Diagnosis not present

## 2023-01-26 ENCOUNTER — Other Ambulatory Visit: Payer: Self-pay | Admitting: *Deleted

## 2023-01-26 MED ORDER — METFORMIN HCL 500 MG PO TABS: 500.0000 mg | ORAL_TABLET | Freq: Every day | ORAL | 1 refills | Status: DC

## 2023-02-08 ENCOUNTER — Telehealth: Payer: Self-pay | Admitting: Family Medicine

## 2023-02-08 NOTE — Telephone Encounter (Signed)
Contacted Vincent Stevens to schedule their annual wellness visit. Appointment made for 02/15/2023.  [place] Direct Dial [phone]

## 2023-02-15 ENCOUNTER — Ambulatory Visit (INDEPENDENT_AMBULATORY_CARE_PROVIDER_SITE_OTHER): Payer: Medicare HMO

## 2023-02-15 VITALS — BP 118/62 | HR 68 | Temp 97.5°F | Wt 196.4 lb

## 2023-02-15 DIAGNOSIS — Z0000 Encounter for general adult medical examination without abnormal findings: Secondary | ICD-10-CM

## 2023-02-15 NOTE — Progress Notes (Signed)
Subjective:   Vincent Stevens is a 77 y.o. male who presents for Medicare Annual/Subsequent preventive examination.  Review of Systems     Cardiac Risk Factors include: advanced age (>32men, >64 women);hypertension;dyslipidemia;male gender     Objective:    Today's Vitals   02/15/23 0807  BP: 118/62  Pulse: 68  Temp: (!) 97.5 F (36.4 C)  SpO2: 99%  Weight: 196 lb 6.4 oz (89.1 kg)   Body mass index is 28.51 kg/m.     02/15/2023    8:11 AM 08/15/2021   10:43 AM 10/20/2020    2:44 PM 09/14/2019    8:41 AM 03/01/2018    6:25 PM 03/01/2018   10:18 AM  Advanced Directives  Does Patient Have a Medical Advance Directive? Yes No No No No No  Type of Paramedic of [place];Living will       Does patient want to make changes to medical advance directive?  No - Patient declined      Copy of [place] in Chart? No - copy requested       Would patient like information on creating a medical advance directive?  No - Patient declined Yes (ED - Information included in AVS) Yes (MAU/Ambulatory/Procedural Areas - Information given) No - Patient declined No - Patient declined    Current Medications (verified) Outpatient Encounter Medications as of 02/15/2023  Medication Sig   aspirin EC 81 MG tablet Take 1 tablet (81 mg total) by mouth daily.   ibuprofen (ADVIL,MOTRIN) 200 MG tablet Take 200 mg by mouth 2 (two) times daily as needed (arthritis apin).    losartan (COZAAR) 50 MG tablet TAKE 1 TABLET EVERY DAY   metFORMIN (GLUCOPHAGE) 500 MG tablet Take 1 tablet (500 mg total) by mouth daily with breakfast.   Multiple Vitamin (MULTIVITAMIN WITH MINERALS) TABS tablet Take 1 tablet by mouth at bedtime.   pantoprazole (PROTONIX) 40 MG tablet TAKE 1 TABLET EVERY DAY   simvastatin (ZOCOR) 80 MG tablet Take 1 tablet (80 mg total) by mouth daily.   tamsulosin (FLOMAX) 0.4 MG CAPS capsule TAKE 1 CAPSULE EVERY DAY   No facility-administered encounter  medications on file as of 02/15/2023.    Allergies (verified) Sulfamethoxazole   History: Past Medical History:  Diagnosis Date   Allergy    Angina, class III ([place]) 03/01/2018   BPH (benign prostatic hyperplasia) 09/19/2009   Qualifier: Diagnosis of  By: Burnice Logan  MD, Doretha Sou    Coronary artery disease involving native coronary artery of native heart with angina pectoris ([place])    Dyslipidemia 08/14/2010   Qualifier: Diagnosis of  By: Burnice Logan  MD, Doretha Sou    Essential hypertension 01/17/2009   Qualifier: Diagnosis of  By: Burnice Logan  MD, Doretha Sou    Gout    History of colonic polyps 09/27/2008   Qualifier: Diagnosis of  By: Burnice Logan  MD, Doretha Sou  Initial colonoscopy 2003.  Hyperplastic polyps only Followup colonoscopy 2008.  Normal  10 year interval recommended    Hx of CABG 03/01/2018   Impaired glucose tolerance 02/20/2013   Left main coronary artery disease    Melanoma ([place])    Pain in both lower extremities 08/15/2018   Past Surgical History:  Procedure Laterality Date   CORONARY ARTERY BYPASS GRAFT N/A 03/01/2018   Procedure: CORONARY ARTERY BYPASS GRAFTING (CABG) times three using the right saphaneous vein. Harvested endoscopicly; and left internal mammary artery.;  Surgeon: Gaye Pollack, MD;  Location: [facility]  OR;  Service: Open Heart Surgery;  Laterality: N/A;   HERNIA REPAIR     LEFT HEART CATH AND CORONARY ANGIOGRAPHY N/A 03/01/2018   Procedure: LEFT HEART CATH AND CORONARY ANGIOGRAPHY;  Surgeon: Leonie Man, MD;  Location: [place] [facility];  Service: Cardiovascular;  Laterality: N/A;   melanoma removal     TEE WITHOUT CARDIOVERSION N/A 03/01/2018   Procedure: TRANSESOPHAGEAL ECHOCARDIOGRAM (TEE);  Surgeon: Gaye Pollack, MD;  Location: [place];  Service: Open Heart Surgery;  Laterality: N/A;   Family History  Problem Relation Age of Onset   Dementia Mother    Heart attack Father    Cancer Sister        Skin   Diabetes Other    Hypertension Other    Social  History   Socioeconomic History   Marital status: Married    Spouse name: Not on file   Number of children: 1   Years of education: Not on file   Highest education level: Not on file  Occupational History   Occupation: Courier     Comment: Part time   Tobacco Use   Smoking status: Former    Types: Cigarettes    Quit date: 11/29/1980    Years since quitting: 42.2   Smokeless tobacco: Never  Vaping Use   Vaping Use: Never used  Substance and Sexual Activity   Alcohol use: No   Drug use: No   Sexual activity: Not on file  Other Topics Concern   Not on file  Social History Narrative   Not on file   Social Determinants of Health   Financial Resource Strain: Low Risk  (02/15/2023)   Overall Financial Resource Strain (CARDIA)    Difficulty of Paying Living Expenses: Not hard at all  Food Insecurity: No Food Insecurity (02/15/2023)   Hunger Vital Sign    Worried About Running Out of Food in the Last Year: Never true    [place] in the Last Year: Never true  Transportation Needs: No Transportation Needs (02/15/2023)   PRAPARE - Hydrologist (Medical): No    Lack of Transportation (Non-Medical): No  Physical Activity: Inactive (02/15/2023)   Exercise Vital Sign    Days of Exercise per Week: 0 days    Minutes of Exercise per Session: 0 min  Stress: No Stress Concern Present (02/15/2023)   [place]    Feeling of Stress : Not at all  Social Connections: Moderately Isolated (02/15/2023)   Social Connection and Isolation Panel [NHANES]    Frequency of Communication with Friends and Family: More than three times a week    Frequency of Social Gatherings with Friends and Family: More than three times a week    Attends Religious Services: Never    Marine scientist or Organizations: No    Attends Music therapist: Never    Marital Status: Married    Tobacco  Counseling Counseling given: Not Answered   Clinical Intake:  Pre-visit preparation completed: Yes  Pain : No/denies pain     BMI - recorded: 28.51 Nutritional Status: BMI 25 -29 Overweight Nutritional Risks: None Diabetes: No  How often do you need to have someone help you when you read instructions, pamphlets, or other written materials from your doctor or pharmacy?: 1 - Never  Diabetic?no  Interpreter Needed?: No  Information entered by :: Charlott Rakes, LPN   Activities of Daily  Living    02/15/2023    8:13 AM  In your present state of health, do you have any difficulty performing the following activities:  Hearing? 0  Vision? 0  Difficulty concentrating or making decisions? 0  Walking or climbing stairs? 0  Dressing or bathing? 0  Doing errands, shopping? 0  Preparing Food and eating ? N  Using the Toilet? N  In the past six months, have you accidently leaked urine? N  Do you have problems with loss of bowel control? N  Managing your Medications? N  Managing your Finances? N  Housekeeping or managing your Housekeeping? N    Patient Care Team: Vivi Barrack, MD as PCP - General (Family Medicine)  Indicate any recent Medical Services you may have received from other than [facility] providers in the past year (date may be approximate).     Assessment:   This is a routine wellness examination for Edgardo.  Hearing/Vision screen Hearing Screening - Comments:: Pt denies any hearing issues  Vision Screening - Comments:: Pt follows up with [facility] for annual eye exams   Dietary issues and exercise activities discussed: Current Exercise Habits: The patient has a physically strenuous job, but has no regular exercise apart from work.   Goals Addressed             This Visit's Progress    Patient Stated       Lose a little more weight        Depression Screen    02/15/2023    8:10 AM 09/09/2022   12:56 PM 08/15/2021   10:45 AM 08/15/2021    10:31 AM 12/08/2020    9:52 AM 12/04/2019    8:05 AM 09/14/2019    8:41 AM  PHQ 2/9 Scores  PHQ - 2 Score 0 0 0 0 0 0 0    Fall Risk    02/15/2023    8:12 AM 09/09/2022   12:57 PM 08/15/2021   10:36 AM 12/08/2020    9:53 AM 12/04/2019    8:05 AM  Fall Risk   Falls in the past year? 0 0 1 1 0  Number falls in past yr: 0 0 0 0   Injury with Fall? 0 0 0 0   Risk for fall due to : Impaired vision No Fall Risks Other (Comment)    Risk for fall due to: Comment   episode of vertigo    Follow up Falls prevention discussed        Ty Ty:  Any stairs in or around the home? Yes  If so, are there any without handrails? No  Home free of loose throw rugs in walkways, pet beds, electrical cords, etc? Yes  Adequate lighting in your home to reduce risk of falls? Yes   ASSISTIVE DEVICES UTILIZED TO PREVENT FALLS:  Life alert? No  Use of a cane, walker or w/c? No  Grab bars in the bathroom? No  Shower chair or bench in shower? No  Elevated toilet seat or a handicapped toilet? No   TIMED UP AND GO:  Was the test performed? Yes .  Length of time to ambulate 10 feet: 10 sec.   Gait steady and fast without use of assistive device  Cognitive Function:        02/15/2023    8:13 AM 08/15/2021   10:38 AM 09/14/2019    8:42 AM  6CIT Screen  What Year? 0 points 0 points  0 points  What month? 0 points 0 points 0 points  What time? 0 points 0 points 0 points  Count back from 20 0 points 0 points 0 points  Months in reverse 4 points 0 points 0 points  Repeat phrase 0 points 0 points 0 points  Total Score 4 points 0 points 0 points    Immunizations Immunization History  Administered Date(s) Administered   Fluad Quad(high Dose 65+) 08/08/2019, 09/08/2022   Influenza Whole 08/12/2009   Influenza, High Dose Seasonal PF 09/01/2013, 09/16/2014, 09/01/2016, 08/28/2017   Influenza,inj,Quad PF,6+ Mos 08/15/2018   Influenza-Unspecified 09/01/2016,  08/28/2017, 09/01/2020   PFIZER(Purple Top)SARS-COV-2 Vaccination 02/02/2020, 03/04/2020, 11/26/2020   Pneumococcal Conjugate-13 04/15/2014   Pneumococcal Polysaccharide-23 04/15/2009, 06/09/2015   Tetanus 04/15/2014    TDAP status: Up to date  Flu Vaccine status: Up to date  Pneumococcal vaccine status: Up to date  Covid-19 vaccine status: Completed vaccines  Qualifies for Shingles Vaccine? Yes   Zostavax completed No   Shingrix Completed?: No.    Education has been provided regarding the importance of this vaccine. Patient has been advised to call insurance company to determine out of pocket expense if they have not yet received this vaccine. Advised may also receive vaccine at local pharmacy or Health Dept. Verbalized acceptance and understanding.  Screening Tests Health Maintenance  Topic Date Due   Zoster Vaccines- Shingrix (1 of 2) Never done   DTaP/Tdap/Td (1 - Tdap) 04/16/2014   Medicare Annual Wellness (AWV)  02/15/2024   Pneumonia Vaccine 18+ Years old  Completed   INFLUENZA VACCINE  Completed   Hepatitis C Screening  Completed   HPV VACCINES  Aged Out   COLONOSCOPY (Pts 45-53yrs Insurance coverage will need to be confirmed)  Discontinued   COVID-19 Vaccine  Discontinued    Health Maintenance  Health Maintenance Due  Topic Date Due   Zoster Vaccines- Shingrix (1 of 2) Never done   DTaP/Tdap/Td (1 - Tdap) 04/16/2014    Colorectal cancer screening: No longer required.    Additional Screening:  Hepatitis C Screening: Completed 06/21/16  Vision Screening: Recommended annual ophthalmology exams for early detection of glaucoma and other disorders of the eye. Is the patient up to date with their annual eye exam?  Yes  Who is the provider or what is the name of the office in which the patient attends annual eye exams? [facility]  If pt is not established with a provider, would they like to be referred to a provider to establish care? No .   Dental Screening:  Recommended annual dental exams for proper oral hygiene  Community Resource Referral / Chronic Care Management: CRR required this visit?  No   CCM required this visit?  No      Plan:     I have personally reviewed and noted the following in the patient's chart:   Medical and social history Use of alcohol, tobacco or illicit drugs  Current medications and supplements including opioid prescriptions. Patient is not currently taking opioid prescriptions. Functional ability and status Nutritional status Physical activity Advanced directives List of other physicians Hospitalizations, surgeries, and ER visits in previous 12 months Vitals Screenings to include cognitive, depression, and falls Referrals and appointments  In addition, I have reviewed and discussed with patient certain preventive protocols, quality metrics, and best practice recommendations. A written personalized care plan for preventive services as well as general preventive health recommendations were provided to patient.     Willette Brace, LPN  02/15/2023   Nurse Notes: none

## 2023-02-15 NOTE — Patient Instructions (Signed)
Mr. Vincent Stevens , Thank you for taking time to come for your Medicare Wellness Visit. I appreciate your ongoing commitment to your health goals. Please review the following plan we discussed and let me know if I can assist you in the future.   These are the goals we discussed:  Goals   None     This is a list of the screening recommended for you and due dates:  Health Maintenance  Topic Date Due   Zoster (Shingles) Vaccine (1 of 2) Never done   DTaP/Tdap/Td vaccine (1 - Tdap) 04/16/2014   Medicare Annual Wellness Visit  08/15/2022   Pneumonia Vaccine  Completed   Flu Shot  Completed   Hepatitis C Screening: USPSTF Recommendation to screen - Ages 78-79 yo.  Completed   HPV Vaccine  Aged Out   Colon Cancer Screening  Discontinued   COVID-19 Vaccine  Discontinued    Advanced directives: Please bring a copy of your health care power of attorney and living will to the office at your convenience.  Conditions/risks identified: lose weight   Next appointment: Follow up in one year for your annual wellness visit.    Preventive Care 85 Years and Older, Male  Preventive care refers to lifestyle choices and visits with your health care provider that can promote health and wellness. What does preventive care include? A yearly physical exam. This is also called an annual well check. Dental exams once or twice a year. Routine eye exams. Ask your health care provider how often you should have your eyes checked. Personal lifestyle choices, including: Daily care of your teeth and gums. Regular physical activity. Eating a healthy diet. Avoiding tobacco and drug use. Limiting alcohol use. Practicing safe sex. Taking low doses of aspirin every day. Taking vitamin and mineral supplements as recommended by your health care provider. What happens during an annual well check? The services and screenings done by your health care provider during your annual well check will depend on your age, overall  health, lifestyle risk factors, and family history of disease. Counseling  Your health care provider may ask you questions about your: Alcohol use. Tobacco use. Drug use. Emotional well-being. Home and relationship well-being. Sexual activity. Eating habits. History of falls. Memory and ability to understand (cognition). Work and work Statistician. Screening  You may have the following tests or measurements: Height, weight, and BMI. Blood pressure. Lipid and cholesterol levels. These may be checked every 5 years, or more frequently if you are over 69 years old. Skin check. Lung cancer screening. You may have this screening every year starting at age 56 if you have a 30-pack-year history of smoking and currently smoke or have quit within the past 15 years. Fecal occult blood test (FOBT) of the stool. You may have this test every year starting at age 1. Flexible sigmoidoscopy or colonoscopy. You may have a sigmoidoscopy every 5 years or a colonoscopy every 10 years starting at age 72. Prostate cancer screening. Recommendations will vary depending on your family history and other risks. Hepatitis C blood test. Hepatitis B blood test. Sexually transmitted disease (STD) testing. Diabetes screening. This is done by checking your blood sugar (glucose) after you have not eaten for a while (fasting). You may have this done every 1-3 years. Abdominal aortic aneurysm (AAA) screening. You may need this if you are a current or former smoker. Osteoporosis. You may be screened starting at age 70 if you are at high risk. Talk with your health care provider  about your test results, treatment options, and if necessary, the need for more tests. Vaccines  Your health care provider may recommend certain vaccines, such as: Influenza vaccine. This is recommended every year. Tetanus, diphtheria, and acellular pertussis (Tdap, Td) vaccine. You may need a Td booster every 10 years. Zoster vaccine. You may  need this after age 75. Pneumococcal 13-valent conjugate (PCV13) vaccine. One dose is recommended after age 40. Pneumococcal polysaccharide (PPSV23) vaccine. One dose is recommended after age 54. Talk to your health care provider about which screenings and vaccines you need and how often you need them. This information is not intended to replace advice given to you by your health care provider. Make sure you discuss any questions you have with your health care provider. Document Released: 12/12/2015 Document Revised: 08/04/2016 Document Reviewed: 09/16/2015 Elsevier Interactive Patient Education  2017 [place] Prevention in the Home Falls can cause injuries. They can happen to people of all ages. There are many things you can do to make your home safe and to help prevent falls. What can I do on the outside of my home? Regularly fix the edges of walkways and driveways and fix any cracks. Remove anything that might make you trip as you walk through a door, such as a raised step or threshold. Trim any bushes or trees on the path to your home. Use bright outdoor lighting. Clear any walking paths of anything that might make someone trip, such as rocks or tools. Regularly check to see if handrails are loose or broken. Make sure that both sides of any steps have handrails. Any raised decks and porches should have guardrails on the edges. Have any leaves, snow, or ice cleared regularly. Use sand or salt on walking paths during winter. Clean up any spills in your garage right away. This includes oil or grease spills. What can I do in the bathroom? Use night lights. Install grab bars by the toilet and in the tub and shower. Do not use towel bars as grab bars. Use non-skid mats or decals in the tub or shower. If you need to sit down in the shower, use a plastic, non-slip stool. Keep the floor dry. Clean up any water that spills on the floor as soon as it happens. Remove soap buildup in  the tub or shower regularly. Attach bath mats securely with double-sided non-slip rug tape. Do not have throw rugs and other things on the floor that can make you trip. What can I do in the bedroom? Use night lights. Make sure that you have a light by your bed that is easy to reach. Do not use any sheets or blankets that are too big for your bed. They should not hang down onto the floor. Have a firm chair that has side arms. You can use this for support while you get dressed. Do not have throw rugs and other things on the floor that can make you trip. What can I do in the kitchen? Clean up any spills right away. Avoid walking on wet floors. Keep items that you use a lot in easy-to-reach places. If you need to reach something above you, use a strong step stool that has a grab bar. Keep electrical cords out of the way. Do not use floor polish or wax that makes floors slippery. If you must use wax, use non-skid floor wax. Do not have throw rugs and other things on the floor that can make you trip. What can I do  with my stairs? Do not leave any items on the stairs. Make sure that there are handrails on both sides of the stairs and use them. Fix handrails that are broken or loose. Make sure that handrails are as long as the stairways. Check any carpeting to make sure that it is firmly attached to the stairs. Fix any carpet that is loose or worn. Avoid having throw rugs at the top or bottom of the stairs. If you do have throw rugs, attach them to the floor with carpet tape. Make sure that you have a light switch at the top of the stairs and the bottom of the stairs. If you do not have them, ask someone to add them for you. What else can I do to help prevent falls? Wear shoes that: Do not have high heels. Have rubber bottoms. Are comfortable and fit you well. Are closed at the toe. Do not wear sandals. If you use a stepladder: Make sure that it is fully opened. Do not climb a closed  stepladder. Make sure that both sides of the stepladder are locked into place. Ask someone to hold it for you, if possible. Clearly mark and make sure that you can see: Any grab bars or handrails. First and last steps. Where the edge of each step is. Use tools that help you move around (mobility aids) if they are needed. These include: Canes. Walkers. Scooters. Crutches. Turn on the lights when you go into a dark area. Replace any light bulbs as soon as they burn out. Set up your furniture so you have a clear path. Avoid moving your furniture around. If any of your floors are uneven, fix them. If there are any pets around you, be aware of where they are. Review your medicines with your doctor. Some medicines can make you feel dizzy. This can increase your chance of falling. Ask your doctor what other things that you can do to help prevent falls. This information is not intended to replace advice given to you by your health care provider. Make sure you discuss any questions you have with your health care provider. Document Released: 09/11/2009 Document Revised: 04/22/2016 Document Reviewed: 12/20/2014 Elsevier Interactive Patient Education  2017 [employer].

## 2023-03-08 ENCOUNTER — Other Ambulatory Visit: Payer: Self-pay | Admitting: Family Medicine

## 2023-03-14 ENCOUNTER — Encounter: Payer: Self-pay | Admitting: Family Medicine

## 2023-03-14 ENCOUNTER — Ambulatory Visit (INDEPENDENT_AMBULATORY_CARE_PROVIDER_SITE_OTHER): Payer: Medicare HMO | Admitting: Family Medicine

## 2023-03-14 VITALS — BP 140/68 | HR 58 | Temp 97.8°F | Ht 69.6 in | Wt 194.4 lb

## 2023-03-14 DIAGNOSIS — K219 Gastro-esophageal reflux disease without esophagitis: Secondary | ICD-10-CM

## 2023-03-14 DIAGNOSIS — I25119 Atherosclerotic heart disease of native coronary artery with unspecified angina pectoris: Secondary | ICD-10-CM | POA: Diagnosis not present

## 2023-03-14 DIAGNOSIS — R0609 Other forms of dyspnea: Secondary | ICD-10-CM

## 2023-03-14 DIAGNOSIS — R7303 Prediabetes: Secondary | ICD-10-CM

## 2023-03-14 DIAGNOSIS — I10 Essential (primary) hypertension: Secondary | ICD-10-CM | POA: Diagnosis not present

## 2023-03-14 LAB — CBC
HCT: 46.3 % (ref 39.0–52.0)
Hemoglobin: 15.7 g/dL (ref 13.0–17.0)
MCHC: 33.9 g/dL (ref 30.0–36.0)
MCV: 88.7 fl (ref 78.0–100.0)
Platelets: 163 10*3/uL (ref 150.0–400.0)
RBC: 5.22 Mil/uL (ref 4.22–5.81)
RDW: 13.8 % (ref 11.5–15.5)
WBC: 5.4 10*3/uL (ref 4.0–10.5)

## 2023-03-14 LAB — COMPREHENSIVE METABOLIC PANEL
ALT: 12 U/L (ref 0–53)
AST: 21 U/L (ref 0–37)
Albumin: 4.5 g/dL (ref 3.5–5.2)
Alkaline Phosphatase: 52 U/L (ref 39–117)
BUN: 17 mg/dL (ref 6–23)
CO2: 28 mEq/L (ref 19–32)
Calcium: 9.5 mg/dL (ref 8.4–10.5)
Chloride: 103 mEq/L (ref 96–112)
Creatinine, Ser: 1.1 mg/dL (ref 0.40–1.50)
GFR: 64.47 mL/min (ref >60.00)
Glucose, Bld: 98 mg/dL (ref 70–99)
Potassium: 4.7 mEq/L (ref 3.5–5.1)
Sodium: 140 mEq/L (ref 135–145)
Total Bilirubin: 0.8 mg/dL (ref 0.2–1.2)
Total Protein: 6.7 g/dL (ref 6.0–8.3)

## 2023-03-14 LAB — POCT GLYCOSYLATED HEMOGLOBIN (HGB A1C): Hemoglobin A1C: 5.6 % (ref 4.0–5.6)

## 2023-03-14 LAB — TSH: TSH: 2.18 u[IU]/mL (ref 0.35–5.50)

## 2023-03-14 MED ORDER — TRIAMCINOLONE ACETONIDE 0.5 % EX OINT: 1.0000 | TOPICAL_OINTMENT | Freq: Two times a day (BID) | CUTANEOUS | 0 refills | Status: DC

## 2023-03-14 NOTE — Patient Instructions (Signed)
It was very nice to see you today!  Please call to schedule appoint with your cardiologist ASAP.  It is very important that we get your heart checked out.  Your A1c looks good today.  Please continue your metformin 500 mg daily.  I think you probably have a small amount of eczema.  Please use triamcinolone twice daily until the rash goes away.  Will see back in 6 months.  Come back sooner if needed.  Take care, Dr Jimmey Ralph  PLEASE NOTE:  If you had any lab tests, please let us know if you have not heard back within a few days. You may see your results on mychart before we have a chance to review them but we will give you a call once they are reviewed by [place].   If we ordered any referrals today, please let us know if you have not heard from their office within the next week.   If you had any urgent prescriptions sent in today, please check with the pharmacy within an hour of our visit to make sure the prescription was transmitted appropriately.   Please try these tips to maintain a healthy lifestyle:  Eat at least 3 REAL meals and 1-2 snacks per day.  Aim for no more than 5 hours between eating.  If you eat breakfast, please do so within one hour of getting up.   Each meal should contain half fruits/vegetables, one quarter protein, and one quarter carbs (no bigger than a computer mouse)  Cut down on sweet beverages. This includes juice, soda, and sweet tea.   Drink at least 1 glass of water with each meal and aim for at least 8 glasses per day  Exercise at least 150 minutes every week.

## 2023-03-14 NOTE — Assessment & Plan Note (Signed)
>>  ASSESSMENT AND PLAN FOR PREDIABETES WRITTEN ON 03/14/2023  9:48 AM BY PARKER, CALEB M, MD  A1c better at 5.6.  Congratulated patient.  Will continue metformin  500 mg daily.  Recheck in 6 months at CPE.

## 2023-03-14 NOTE — Assessment & Plan Note (Signed)
A1c better at 5.6.  Congratulated patient.  Will continue metformin 500 mg daily.  Recheck in 6 months at CPE.

## 2023-03-14 NOTE — Assessment & Plan Note (Signed)
140/68 on current regimen losartan 50 mg daily.  Check labs.  He will continue to monitor at home.

## 2023-03-14 NOTE — Progress Notes (Signed)
Vincent Stevens is a 77 y.o. male who presents today for an office visit.  Assessment/Plan:  New/Acute Problems: Dyspnea On Exertion  No current symptoms and feels well.  Normal exam today.  Concern for possible angina equivalent especially in light of his cardiac history including CABG 5 years ago.  Given that symptoms have been ongoing for the last 6 to 12 months and is not currently having any symptoms do not think we need to do any emergent evaluation at this point. EKG Today shows sinus bradycardia without any ischemic changes.  Will check labs today including CBC, c-Met, and TSH.  We discuss with patient that it is very important he see his cardiologist for further evaluation ASAP and he will call to schedule appointment this morning.  We discussed reasons to return to care or seek emergent care.  Rash No red flags.  Consistent with possible mild eczema.  Will start topical triamcinolone for the next couple of weeks.  He will let us know if not improving.  Chronic Problems Addressed Today: Prediabetes A1c better at 5.6.  Congratulated patient.  Will continue metformin 500 mg daily.  Recheck in 6 months at CPE.  Coronary artery disease involving native coronary artery of native heart with angina pectoris [facility]) Follows with cardiology.  On aspirin and statin.  Essential hypertension 140/68 on current regimen losartan 50 mg daily.  Check labs.  He will continue to monitor at home.     Subjective:  HPI:  See A/P for status of chronic conditions.  Patient is here today for follow-up.  We saw him 6 months ago for annual physical.  A1c was 6.3.  We started him on metformin 500 mg daily.  He has been tolerating well.  He has noticed that he is having more shortness of breath with exertion. This has been happening intermittently for the last 6-12 months.  States that he feels very fatigued afterwards and has to rest a lot before he can resume activity.  This seems to come and go.   Sometimes he does well and then sometimes he has symptoms.  No chest pain though does feel occasional heartburn/reflux symptoms.  No nausea or vomiting.  No lightheadedness or dizziness.  Currently feels well.  He also has a rash on his left flank. This has been present for several months. Comes and goes.  No treatments tried.  No obvious exposures.       Objective:  Physical Exam: BP (!) 140/68   Pulse (!) 58   Temp 97.8 F (36.6 C) (Temporal)   Ht 5' 9.6" (1.768 m)   Wt 194 lb 6.4 oz (88.2 kg)   SpO2 97%   BMI 28.22 kg/m   Gen: No acute distress, resting comfortably CV: Regular rate and rhythm with no murmurs appreciated Pulm: Normal work of breathing, clear to auscultation bilaterally with no crackles, wheezes, or rhonchi Skin: Approximately 10 x 3 cm faintly erythematous rash on left flank.  Surrounding dry skin noted. Neuro: Grossly normal, moves all extremities Psych: Normal affect and thought content  EKG: Sinus bradycardia.  Ventricular rate 51.  No ischemic changes.  Time Spent: 40 minutes of total time was spent on the date of the encounter performing the following actions: chart review prior to seeing the patient, obtaining history, performing a medically necessary exam, counseling on the treatment plan, placing orders, reviewing recent cardiology note and cardiac workup, and documenting in our EHR.  This does include time spent reviewing/interpreting his EKG  Vincent Stevens [name], MD 03/14/2023 10:19 AM

## 2023-03-14 NOTE — Assessment & Plan Note (Signed)
Follows with cardiology.  On aspirin and statin. 

## 2023-03-16 NOTE — Progress Notes (Signed)
Labs are all normal.  Do not need to make any changes to his treatment plan at this time.  He needs to see his cardiologist ASAP as we discussed at his office visit.  He should keep up the great work with diet and exercise and we can recheck everything next time when he comes in.

## 2023-03-17 ENCOUNTER — Other Ambulatory Visit: Payer: Self-pay | Admitting: Family Medicine

## 2023-04-18 ENCOUNTER — Other Ambulatory Visit: Payer: Self-pay | Admitting: Cardiology

## 2023-04-19 DIAGNOSIS — Z85828 Personal history of other malignant neoplasm of skin: Secondary | ICD-10-CM | POA: Diagnosis not present

## 2023-04-19 DIAGNOSIS — L814 Other melanin hyperpigmentation: Secondary | ICD-10-CM | POA: Diagnosis not present

## 2023-04-19 DIAGNOSIS — Z08 Encounter for follow-up examination after completed treatment for malignant neoplasm: Secondary | ICD-10-CM | POA: Diagnosis not present

## 2023-04-19 DIAGNOSIS — L821 Other seborrheic keratosis: Secondary | ICD-10-CM | POA: Diagnosis not present

## 2023-04-19 DIAGNOSIS — L905 Scar conditions and fibrosis of skin: Secondary | ICD-10-CM | POA: Diagnosis not present

## 2023-04-19 DIAGNOSIS — L570 Actinic keratosis: Secondary | ICD-10-CM | POA: Diagnosis not present

## 2023-04-19 DIAGNOSIS — D225 Melanocytic nevi of trunk: Secondary | ICD-10-CM | POA: Diagnosis not present

## 2023-04-19 DIAGNOSIS — Z85820 Personal history of malignant melanoma of skin: Secondary | ICD-10-CM | POA: Diagnosis not present

## 2023-06-22 ENCOUNTER — Other Ambulatory Visit: Payer: Self-pay | Admitting: Cardiology

## 2023-07-16 ENCOUNTER — Other Ambulatory Visit: Payer: Self-pay | Admitting: Family Medicine

## 2023-08-04 ENCOUNTER — Other Ambulatory Visit: Payer: Self-pay | Admitting: Cardiology

## 2023-08-25 ENCOUNTER — Other Ambulatory Visit: Payer: Self-pay | Admitting: Cardiology

## 2023-09-12 ENCOUNTER — Encounter: Payer: Self-pay | Admitting: Family Medicine

## 2023-09-12 ENCOUNTER — Ambulatory Visit: Payer: Medicare HMO | Admitting: Family Medicine

## 2023-09-12 VITALS — BP 163/78 | HR 50 | Temp 97.5°F | Ht 69.6 in | Wt 195.0 lb

## 2023-09-12 DIAGNOSIS — E785 Hyperlipidemia, unspecified: Secondary | ICD-10-CM | POA: Diagnosis not present

## 2023-09-12 DIAGNOSIS — I10 Essential (primary) hypertension: Secondary | ICD-10-CM

## 2023-09-12 DIAGNOSIS — K219 Gastro-esophageal reflux disease without esophagitis: Secondary | ICD-10-CM

## 2023-09-12 DIAGNOSIS — N400 Enlarged prostate without lower urinary tract symptoms: Secondary | ICD-10-CM | POA: Diagnosis not present

## 2023-09-12 DIAGNOSIS — Z0000 Encounter for general adult medical examination without abnormal findings: Secondary | ICD-10-CM

## 2023-09-12 DIAGNOSIS — Z23 Encounter for immunization: Secondary | ICD-10-CM | POA: Diagnosis not present

## 2023-09-12 DIAGNOSIS — R7303 Prediabetes: Secondary | ICD-10-CM | POA: Diagnosis not present

## 2023-09-12 LAB — TSH: TSH: 3.96 u[IU]/mL (ref 0.35–5.50)

## 2023-09-12 LAB — COMPREHENSIVE METABOLIC PANEL
ALT: 13 U/L (ref 0–53)
AST: 18 U/L (ref 0–37)
Albumin: 4.2 g/dL (ref 3.5–5.2)
Alkaline Phosphatase: 49 U/L (ref 39–117)
BUN: 13 mg/dL (ref 6–23)
CO2: 28 meq/L (ref 19–32)
Calcium: 9.5 mg/dL (ref 8.4–10.5)
Chloride: 105 meq/L (ref 96–112)
Creatinine, Ser: 1 mg/dL (ref 0.40–1.50)
GFR: 72.03 mL/min (ref >60.00)
Glucose, Bld: 96 mg/dL (ref 70–99)
Potassium: 4.6 meq/L (ref 3.5–5.1)
Sodium: 140 meq/L (ref 135–145)
Total Bilirubin: 0.9 mg/dL (ref 0.2–1.2)
Total Protein: 6.5 g/dL (ref 6.0–8.3)

## 2023-09-12 LAB — CBC
HCT: 46.1 % (ref 39.0–52.0)
Hemoglobin: 15 g/dL (ref 13.0–17.0)
MCHC: 32.6 g/dL (ref 30.0–36.0)
MCV: 89.2 fL (ref 78.0–100.0)
Platelets: 170 10*3/uL (ref 150.0–400.0)
RBC: 5.17 Mil/uL (ref 4.22–5.81)
RDW: 14.7 % (ref 11.5–15.5)
WBC: 7.4 10*3/uL (ref 4.0–10.5)

## 2023-09-12 LAB — LIPID PANEL
Cholesterol: 137 mg/dL (ref 0–200)
HDL: 53.7 mg/dL (ref >39.00)
LDL Cholesterol: 71 mg/dL (ref 0–99)
NonHDL: 83.62
Total CHOL/HDL Ratio: 3
Triglycerides: 64 mg/dL (ref 0.0–149.0)
VLDL: 12.8 mg/dL (ref 0.0–40.0)

## 2023-09-12 LAB — HEMOGLOBIN A1C: Hgb A1c MFr Bld: 6.1 % (ref 4.6–6.5)

## 2023-09-12 LAB — PSA: PSA: 0.43 ng/mL (ref 0.10–4.00)

## 2023-09-12 NOTE — Assessment & Plan Note (Signed)
Check A1c.  Continue metformin 500 mg daily. ?

## 2023-09-12 NOTE — Assessment & Plan Note (Signed)
On simvastatin 80 mg daily per cardiology.  Check lipids.

## 2023-09-12 NOTE — Assessment & Plan Note (Signed)
Stable on Flomax 0.4 mg daily.  Check PSA

## 2023-09-12 NOTE — Patient Instructions (Signed)
It was very nice to see you today!  Your blood pressure is very high today.  Please increase your losartan to 100 mg daily.  Monitor your blood pressure at home over the next few days and let us know if it is not improving.  Will check blood work today.  Return in about 1 year (around 09/11/2024) for Annual Physical.   Take care, Dr Jimmey Ralph  PLEASE NOTE:  If you had any lab tests, please let us know if you have not heard back within a few days. You may see your results on mychart before we have a chance to review them but we will give you a call once they are reviewed by [place].   If we ordered any referrals today, please let us know if you have not heard from their office within the next week.   If you had any urgent prescriptions sent in today, please check with the pharmacy within an hour of our visit to make sure the prescription was transmitted appropriately.   Please try these tips to maintain a healthy lifestyle:  Eat at least 3 REAL meals and 1-2 snacks per day.  Aim for no more than 5 hours between eating.  If you eat breakfast, please do so within one hour of getting up.   Each meal should contain half fruits/vegetables, one quarter protein, and one quarter carbs (no bigger than a computer mouse)  Cut down on sweet beverages. This includes juice, soda, and sweet tea.   Drink at least 1 glass of water with each meal and aim for at least 8 glasses per day  Exercise at least 150 minutes every week.    Preventive Care 35 Years and Older, Male Preventive care refers to lifestyle choices and visits with your health care provider that can promote health and wellness. Preventive care visits are also called wellness exams. What can I expect for my preventive care visit? Counseling During your preventive care visit, your health care provider may ask about your: Medical history, including: Past medical problems. Family medical history. History of falls. Current health,  including: Emotional well-being. Home life and relationship well-being. Sexual activity. Memory and ability to understand (cognition). Lifestyle, including: Alcohol, nicotine or tobacco, and drug use. Access to firearms. Diet, exercise, and sleep habits. Work and work Astronomer. Sunscreen use. Safety issues such as seatbelt and bike helmet use. Physical exam Your health care provider will check your: Height and weight. These may be used to calculate your BMI (body mass index). BMI is a measurement that tells if you are at a healthy weight. Waist circumference. This measures the distance around your waistline. This measurement also tells if you are at a healthy weight and may help predict your risk of certain diseases, such as type 2 diabetes and high blood pressure. Heart rate and blood pressure. Body temperature. Skin for abnormal spots. What immunizations do I need?  Vaccines are usually given at various ages, according to a schedule. Your health care provider will recommend vaccines for you based on your age, medical history, and lifestyle or other factors, such as travel or where you work. What tests do I need? Screening Your health care provider may recommend screening tests for certain conditions. This may include: Lipid and cholesterol levels. Diabetes screening. This is done by checking your blood sugar (glucose) after you have not eaten for a while (fasting). Hepatitis C test. Hepatitis B test. HIV (human immunodeficiency virus) test. STI (sexually transmitted infection) testing, if you are  at risk. Lung cancer screening. Colorectal cancer screening. Prostate cancer screening. Abdominal aortic aneurysm (AAA) screening. You may need this if you are a current or former smoker. Talk with your health care provider about your test results, treatment options, and if necessary, the need for more tests. Follow these instructions at home: Eating and drinking  Eat a diet that  includes fresh fruits and vegetables, whole grains, lean protein, and low-fat dairy products. Limit your intake of foods with high amounts of sugar, saturated fats, and salt. Take vitamin and mineral supplements as recommended by your health care provider. Do not drink alcohol if your health care provider tells you not to drink. If you drink alcohol: Limit how much you have to 0-2 drinks a day. Know how much alcohol is in your drink. In the [place] one drink equals one 12 oz bottle of beer (355 mL), one 5 oz glass of wine (148 mL), or one 1 oz glass of hard liquor (44 mL). Lifestyle Brush your teeth every morning and night with fluoride toothpaste. Floss one time each day. Exercise for at least 30 minutes 5 or more days each week. Do not use any products that contain nicotine or tobacco. These products include cigarettes, chewing tobacco, and vaping devices, such as e-cigarettes. If you need help quitting, ask your health care provider. Do not use drugs. If you are sexually active, practice safe sex. Use a condom or other form of protection to prevent STIs. Take aspirin only as told by your health care provider. Make sure that you understand how much to take and what form to take. Work with your health care provider to find out whether it is safe and beneficial for you to take aspirin daily. Ask your health care provider if you need to take a cholesterol-lowering medicine (statin). Find healthy ways to manage stress, such as: Meditation, yoga, or listening to music. Journaling. Talking to a trusted person. Spending time with friends and family. Safety Always wear your seat belt while driving or riding in a vehicle. Do not drive: If you have been drinking alcohol. Do not ride with someone who has been drinking. When you are tired or distracted. While texting. If you have been using any mind-altering substances or drugs. Wear a helmet and other protective equipment during sports  activities. If you have firearms in your house, make sure you follow all gun safety procedures. Minimize exposure to UV radiation to reduce your risk of skin cancer. What's next? Visit your health care provider once a year for an annual wellness visit. Ask your health care provider how often you should have your eyes and teeth checked. Stay up to date on all vaccines. This information is not intended to replace advice given to you by your health care provider. Make sure you discuss any questions you have with your health care provider. Document Revised: 05/13/2021 Document Reviewed: 05/13/2021 Elsevier Patient Education  2024 [employer].

## 2023-09-12 NOTE — Assessment & Plan Note (Signed)
>>  ASSESSMENT AND PLAN FOR PREDIABETES WRITTEN ON 09/12/2023 10:10 AM BY KENNYTH, CALEB M, MD  Check A1c.  Continue metformin  500 mg daily.

## 2023-09-12 NOTE — Assessment & Plan Note (Signed)
Blood pressure elevated to 181/72 on initial check today.  Currently asymptomatic.  He has also been elevated at home.  On recheck, 163/78. We will increase his losartan to 100 mg daily.  He will monitor at home.  He does have follow-up with cardiology in a couple of weeks though will follow-up with [place] in a few weeks via MyChart.

## 2023-09-12 NOTE — Assessment & Plan Note (Signed)
On Protonix 40 mg daily.  Still gets occasional breakthrough.  Discussed referral to GI however he declined.  He will let us know if he changes his mind.

## 2023-09-12 NOTE — Progress Notes (Signed)
Chief Complaint:  Vincent Stevens is a 77 y.o. male who presents today for his annual comprehensive physical exam.    Assessment/Plan:  Chronic Problems Addressed Today: Essential hypertension Blood pressure elevated to 181/72 on initial check today.  Currently asymptomatic.  He has also been elevated at home.  On recheck, 163/78. We will increase his losartan to 100 mg daily.  He will monitor at home.  He does have follow-up with cardiology in a couple of weeks though will follow-up with [place] in a few weeks via MyChart.  Dyslipidemia On simvastatin 80 mg daily per cardiology.  Check lipids.  BPH (benign prostatic hyperplasia) Stable on Flomax 0.4 mg daily.  Check PSA  Prediabetes Check A1c.  Continue metformin 500 mg daily.  GERD (gastroesophageal reflux disease) On Protonix 40 mg daily.  Still gets occasional breakthrough.  Discussed referral to GI however he declined.  He will let us know if he changes his mind.  Preventative Healthcare: Check labs.  Flu shot given today.  Patient Counseling(The following topics were reviewed and/or handout was given):  -Nutrition: Stressed importance of moderation in sodium/caffeine intake, saturated fat and cholesterol, caloric balance, sufficient intake of fresh fruits, vegetables, and fiber.  -Stressed the importance of regular exercise.   -Substance Abuse: Discussed cessation/primary prevention of tobacco, alcohol, or other drug use; driving or other dangerous activities under the influence; availability of treatment for abuse.   -Injury prevention: Discussed safety belts, safety helmets, smoke detector, smoking near bedding or upholstery.   -Sexuality: Discussed sexually transmitted diseases, partner selection, use of condoms, avoidance of unintended pregnancy and contraceptive alternatives.   -Dental health: Discussed importance of regular tooth brushing, flossing, and dental visits.  -Health maintenance and immunizations reviewed. Please  refer to Health maintenance section.  Return to care in 1 year for next preventative visit.     Subjective:  HPI:  He has no acute complaints today. He did stop his blood pressure medications about 2-3 weeks ago due to his blood pressure being low at home into the 90's/40s. After stopping his blood pressure started increasing and he restarted about a week ago.   Lifestyle Diet: None specific.  Exercise: Tries to walk to mailbox routinely.      09/12/2023    9:27 AM  Depression screen PHQ 2/9  Decreased Interest 0  Down, Depressed, Hopeless 0  PHQ - 2 Score 0    There are no preventive care reminders to display for this patient.   ROS: Per HPI, otherwise a complete review of systems was negative.   PMH:  The following were reviewed and entered/updated in epic: Past Medical History:  Diagnosis Date   Allergy    Angina, class III (HCC) 03/01/2018   BPH (benign prostatic hyperplasia) 09/19/2009   Qualifier: Diagnosis of  By: Amador Cunas  MD, Janett Labella    Coronary artery disease involving native coronary artery of native heart with angina pectoris (HCC)    Dyslipidemia 08/14/2010   Qualifier: Diagnosis of  By: Amador Cunas  MD, Janett Labella    Essential hypertension 01/17/2009   Qualifier: Diagnosis of  By: Amador Cunas  MD, Janett Labella    Gout    History of colonic polyps 09/27/2008   Qualifier: Diagnosis of  By: Amador Cunas  MD, Janett Labella  Initial colonoscopy 2003.  Hyperplastic polyps only Followup colonoscopy 2008.  Normal  10 year interval recommended    Hx of CABG 03/01/2018   Impaired glucose tolerance 02/20/2013   Left main coronary artery disease  Melanoma (HCC)    Pain in both lower extremities 08/15/2018   Patient Active Problem List   Diagnosis Date Noted   Prediabetes 09/13/2022   GERD (gastroesophageal reflux disease) 09/09/2022   Allergy 06/17/2021   Pain in both lower extremities 08/15/2018   Gout    Melanoma (HCC)    Angina, class III (HCC) 03/01/2018   Hx of CABG  03/01/2018   Coronary artery disease involving native coronary artery of native heart with angina pectoris (HCC)    Dyslipidemia 08/14/2010   GOUT, ACUTE 11/07/2009   BPH (benign prostatic hyperplasia) 09/19/2009   Essential hypertension 01/17/2009   History of colonic polyps 09/27/2008   Past Surgical History:  Procedure Laterality Date   CORONARY ARTERY BYPASS GRAFT N/A 03/01/2018   Procedure: CORONARY ARTERY BYPASS GRAFTING (CABG) times three using the right saphaneous vein. Harvested endoscopicly; and left internal mammary artery.;  Surgeon: Alleen Borne, MD;  Location: [facility] OR;  Service: Open Heart Surgery;  Laterality: N/A;   HERNIA REPAIR     LEFT HEART CATH AND CORONARY ANGIOGRAPHY N/A 03/01/2018   Procedure: LEFT HEART CATH AND CORONARY ANGIOGRAPHY;  Surgeon: Marykay Lex, MD;  Location: [facility] INVASIVE CV LAB;  Service: Cardiovascular;  Laterality: N/A;   melanoma removal     TEE WITHOUT CARDIOVERSION N/A 03/01/2018   Procedure: TRANSESOPHAGEAL ECHOCARDIOGRAM (TEE);  Surgeon: Alleen Borne, MD;  Location: [facility] OR;  Service: Open Heart Surgery;  Laterality: N/A;    Family History  Problem Relation Age of Onset   Dementia Mother    Heart attack Father    Cancer Sister        Skin   Diabetes Other    Hypertension Other     Medications- reviewed and updated Current Outpatient Medications  Medication Sig Dispense Refill   aspirin EC 81 MG tablet Take 1 tablet (81 mg total) by mouth daily.     ibuprofen (ADVIL,MOTRIN) 200 MG tablet Take 200 mg by mouth 2 (two) times daily as needed (arthritis apin).      losartan (COZAAR) 50 MG tablet Take 1 tablet (50 mg total) by mouth daily. Patient needs appointment for further refills. 1 st attempt 30 tablet 0   metFORMIN (GLUCOPHAGE) 500 MG tablet TAKE 1 TABLET EVERY DAY WITH BREAKFAST 90 tablet 3   Multiple Vitamin (MULTIVITAMIN WITH MINERALS) TABS tablet Take 1 tablet by mouth at bedtime.     pantoprazole (PROTONIX) 40 MG tablet TAKE 1  TABLET EVERY DAY 90 tablet 3   simvastatin (ZOCOR) 80 MG tablet Take 1 tablet (80 mg total) by mouth daily. Patient needs an appointment for further refills. 3 rd/final attempt 15 tablet 0   tamsulosin (FLOMAX) 0.4 MG CAPS capsule TAKE 1 CAPSULE EVERY DAY 90 capsule 3   triamcinolone ointment (KENALOG) 0.5 % APPLY 1 APPLICATION TOPICALLY 2 (TWO) TIMES DAILY. 30 g 5   No current facility-administered medications for this visit.    Allergies-reviewed and updated Allergies  Allergen Reactions   Sulfamethoxazole Hives and Itching    Social History   Socioeconomic History   Marital status: Married    Spouse name: Not on file   Number of children: 1   Years of education: Not on file   Highest education level: Not on file  Occupational History   Occupation: Courier     Comment: Part time   Tobacco Use   Smoking status: Former    Current packs/day: 0.00    Types: Cigarettes    Quit date:  11/29/1980    Years since quitting: 42.8   Smokeless tobacco: Never  Vaping Use   Vaping status: Never Used  Substance and Sexual Activity   Alcohol use: No   Drug use: No   Sexual activity: Not on file  Other Topics Concern   Not on file  Social History Narrative   Not on file   Social Determinants of Health   Financial Resource Strain: Low Risk  (02/15/2023)   Overall Financial Resource Strain (CARDIA)    Difficulty of Paying Living Expenses: Not hard at all  Food Insecurity: No Food Insecurity (02/15/2023)   Hunger Vital Sign    Worried About Running Out of Food in the Last Year: Never true    Ran Out of Food in the Last Year: Never true  Transportation Needs: No Transportation Needs (02/15/2023)   PRAPARE - Administrator, Civil Service (Medical): No    Lack of Transportation (Non-Medical): No  Physical Activity: Inactive (02/15/2023)   Exercise Vital Sign    Days of Exercise per Week: 0 days    Minutes of Exercise per Session: 0 min  Stress: No Stress Concern Present  (02/15/2023)   [employer] of Occupational Health - Occupational Stress Questionnaire    Feeling of Stress : Not at all  Social Connections: Moderately Isolated (02/15/2023)   Social Connection and Isolation Panel [NHANES]    Frequency of Communication with Friends and Family: More than three times a week    Frequency of Social Gatherings with Friends and Family: More than three times a week    Attends Religious Services: Never    Database administrator or Organizations: No    Attends Engineer, structural: Never    Marital Status: Married        Objective:  Physical Exam: BP (!) 163/78   Pulse (!) 50   Temp (!) 97.5 F (36.4 C) (Temporal)   Ht 5' 9.6" (1.768 m)   Wt 195 lb (88.5 kg)   SpO2 98%   BMI 28.30 kg/m   Body mass index is 28.3 kg/m. Wt Readings from Last 3 Encounters:  09/12/23 195 lb (88.5 kg)  03/14/23 194 lb 6.4 oz (88.2 kg)  02/15/23 196 lb 6.4 oz (89.1 kg)   Gen: NAD, resting comfortably HEENT: TMs normal bilaterally. OP clear. No thyromegaly noted.  CV: RRR with no murmurs appreciated Pulm: NWOB, CTAB with no crackles, wheezes, or rhonchi GI: Normal bowel sounds present. Soft, Nontender, Nondistended. MSK: no edema, cyanosis, or clubbing noted Skin: warm, dry Neuro: CN2-12 grossly intact. Strength 5/5 in upper and lower extremities. Reflexes symmetric and intact bilaterally.  Psych: Normal affect and thought content     Lance Huaracha M. Jimmey Ralph, MD 09/12/2023 10:11 AM

## 2023-09-14 NOTE — Progress Notes (Signed)
Blood sugar is up a little bit with A1c of 6.1 but the rest of his labs are all stable.  Do not need to make any changes to his treatment plan.  He should continue to work on diet and exercise.  He should come back in about 6 months or so to recheck his A1c and I would like for him to follow-up with [place] in a few weeks to let us know how his blood pressure is doing.  Katina Degree. Jimmey Ralph, MD 09/14/2023 7:29 AM

## 2023-09-17 ENCOUNTER — Other Ambulatory Visit: Payer: Self-pay | Admitting: Family Medicine

## 2023-09-19 ENCOUNTER — Telehealth: Payer: Self-pay | Admitting: Family Medicine

## 2023-09-19 ENCOUNTER — Other Ambulatory Visit: Payer: Self-pay | Admitting: *Deleted

## 2023-09-19 MED ORDER — PANTOPRAZOLE SODIUM 40 MG PO TBEC: 40.0000 mg | DELAYED_RELEASE_TABLET | Freq: Every day | ORAL | 3 refills | Status: DC

## 2023-09-19 NOTE — Telephone Encounter (Signed)
Please advise 

## 2023-09-19 NOTE — Telephone Encounter (Signed)
Patient states PCP increased dosage to taking 2 tablets daily of losartan (COZAAR) 50 MG tablet.  AND  States PCP increased dosage  to taking 2 tablets daily of pantoprazole (PROTONIX) 40 MG tablet     Requests 90 day supply RX's for the above medications with increased dosage be sent to:  [facility] - [place], [place] - [address] Phone: [phone]  Fax: [phone]

## 2023-09-20 ENCOUNTER — Other Ambulatory Visit: Payer: Self-pay | Admitting: *Deleted

## 2023-09-20 MED ORDER — LOSARTAN POTASSIUM 50 MG PO TABS: 50.0000 mg | ORAL_TABLET | Freq: Two times a day (BID) | ORAL | 1 refills | Status: DC

## 2023-09-20 MED ORDER — PANTOPRAZOLE SODIUM 40 MG PO TBEC: 40.0000 mg | DELAYED_RELEASE_TABLET | Freq: Two times a day (BID) | ORAL | 1 refills | Status: DC

## 2023-09-20 NOTE — Telephone Encounter (Signed)
Ok with me. Please place any necessary orders. 

## 2023-09-20 NOTE — Telephone Encounter (Signed)
Rx sedn to [facility]

## 2023-09-29 ENCOUNTER — Ambulatory Visit: Payer: Medicare HMO | Admitting: Cardiology

## 2023-10-20 ENCOUNTER — Ambulatory Visit: Payer: Medicare HMO | Attending: Cardiology | Admitting: Cardiology

## 2023-10-20 ENCOUNTER — Encounter: Payer: Self-pay | Admitting: Cardiology

## 2023-10-20 VITALS — BP 138/70 | HR 66 | Ht 69.0 in | Wt 194.0 lb

## 2023-10-20 DIAGNOSIS — I25119 Atherosclerotic heart disease of native coronary artery with unspecified angina pectoris: Secondary | ICD-10-CM | POA: Diagnosis not present

## 2023-10-20 DIAGNOSIS — R079 Chest pain, unspecified: Secondary | ICD-10-CM | POA: Insufficient documentation

## 2023-10-20 DIAGNOSIS — E088 Diabetes mellitus due to underlying condition with unspecified complications: Secondary | ICD-10-CM

## 2023-10-20 DIAGNOSIS — E785 Hyperlipidemia, unspecified: Secondary | ICD-10-CM

## 2023-10-20 DIAGNOSIS — Z951 Presence of aortocoronary bypass graft: Secondary | ICD-10-CM | POA: Diagnosis not present

## 2023-10-20 HISTORY — DX: Chest pain, unspecified: R07.9

## 2023-10-20 HISTORY — DX: Diabetes mellitus due to underlying condition with unspecified complications: E08.8

## 2023-10-20 MED ORDER — NITROGLYCERIN 0.4 MG SL SUBL: 0.4000 mg | SUBLINGUAL_TABLET | SUBLINGUAL | 6 refills | Status: DC | PRN

## 2023-10-20 NOTE — Patient Instructions (Addendum)
Medication Instructions:  Your physician has recommended you make the following change in your medication:   Use nitroglycerin 1 tablet placed under the tongue at the first sign of chest pain or an angina attack. 1 tablet may be used every 5 minutes as needed, for up to 15 minutes. Do not take more than 3 tablets in 15 minutes. If pain persist call 911 or go to the nearest ED.  *If you need a refill on your cardiac medications before your next appointment, please call your pharmacy*   Lab Work: none    Testing/Procedures:   [facility] at [facility] [address], [address] [place], [place] [address] Phone: [phone]   Please arrive 15 minutes prior to your appointment time for registration and insurance purposes.   The test will take approximately 3 to 4 hours to complete; you may bring reading material.  If someone comes with you to your appointment, they will need to remain in the main lobby due to limited space in the testing area. **If you are pregnant or breastfeeding, please notify the nuclear lab prior to your appointment**   How to prepare for your Myocardial Perfusion Test: Do not eat or drink 3 hours prior to your test, except you may have water. Do not consume products containing caffeine (regular or decaffeinated) 12 hours prior to your test. (ex: coffee, chocolate, sodas, tea). Do bring a list of your current medications with you.  If not listed below, you may take your medications as normal. Do wear comfortable clothes (no dresses or overalls) and walking shoes, tennis shoes preferred (No heels or open toe shoes are allowed). Do NOT wear cologne, perfume, aftershave, or lotions (deodorant is allowed). If these instructions are not followed, your test will have to be rescheduled.   Please report to [address] for your test.  If you have questions or concerns about your appointment, you can call the Nuclear Lab at  [phone].   If you cannot keep your appointment, please provide 24 hours notification to the Nuclear Lab, to avoid a possible $50 charge to your account.    Your physician has requested that you have an echocardiogram. Echocardiography is a painless test that uses sound waves to create images of your heart. It provides your doctor with information about the size and shape of your heart and how well your heart's chambers and valves are working. This procedure takes approximately one hour. There are no restrictions for this procedure. Please do NOT wear cologne, perfume, aftershave, or lotions (deodorant is allowed). Please arrive 15 minutes prior to your appointment time.  Please note: We ask at that you not bring children with you during ultrasound (echo/ vascular) testing. Due to room size and safety concerns, children are not allowed in the ultrasound rooms during exams. Our front office staff cannot provide observation of children in our lobby area while testing is being conducted. An adult accompanying a patient to their appointment will only be allowed in the ultrasound room at the discretion of the ultrasound technician under special circumstances. We apologize for any inconvenience.    Follow-Up: At [facility], you and your health needs are our priority.  As part of our continuing mission to provide you with exceptional heart care, we have created designated Provider Care Teams.  These Care Teams include your primary Cardiologist (physician) and Advanced Practice Providers (APPs -  Physician Assistants and Nurse Practitioners) who all work together to provide  you with the care you need, when you need it.  We recommend signing up for the patient portal called "MyChart".  Sign up information is provided on this After Visit Summary.  MyChart is used to connect with patients for Virtual Visits (Telemedicine).  Patients are able to view lab/test results, encounter notes, upcoming  appointments, etc.  Non-urgent messages can be sent to your provider as well.   To learn more about what you can do with MyChart, go to [email or web address].    Your next appointment:   12 month(s)  Provider:   Belva Crome, MD   Other Instructions  Cardiac Nuclear Scan A cardiac nuclear scan is a test that is done to check the flow of blood to your heart. It is done when you are resting and when you are exercising. The test looks for problems such as: Not enough blood reaching a portion of the heart. The heart muscle not working as it should. You may need this test if you have: Heart disease. Lab results that are not normal. Had heart surgery or a balloon procedure to open up blocked arteries (angioplasty) or a small mesh tube (stent). Chest pain. Shortness of breath. Had a heart attack. In this test, a special dye (tracer) is put into your bloodstream. The tracer will travel to your heart. A camera will then take pictures of your heart to see how the tracer moves through your heart. This test is usually done at a hospital and takes 2-4 hours. Tell a doctor about: Any allergies you have. All medicines you are taking, including vitamins, herbs, eye drops, creams, and over-the-counter medicines. Any bleeding problems you have. Any surgeries you have had. Any medical conditions you have. Whether you are pregnant or may be pregnant. Any history of asthma or long-term (chronic) lung disease. Any history of heart rhythm disorders or heart valve conditions. What are the risks? Your doctor will talk with you about risks. These may include: Serious chest pain and heart attack. This is only a risk if the stress portion of the test is done. Fast or uneven heartbeats (palpitations). A feeling of warmth in your chest. This feeling usually does not last long. Allergic reaction to the tracer. Shortness of breath or trouble breathing. What happens before the test? Ask your doctor about  changing or stopping your normal medicines. Follow instructions from your doctor about what you cannot eat or drink. Remove your jewelry on the day of the test. Ask your doctor if you need to avoid nicotine or caffeine. What happens during the test? An IV tube will be inserted into one of your veins. Your doctor will give you a small amount of tracer through the IV tube. You will wait for 20-40 minutes while the tracer moves through your bloodstream. Your heart will be monitored with an electrocardiogram (ECG). You will lie down on an exam table. Pictures of your heart will be taken for about 15-20 minutes. You may also have a stress test. For this test, one of these things may be done: You will be asked to exercise on a treadmill or a stationary bike. You will be given medicines that will make your heart work harder. This is done if you are unable to exercise. When blood flow to your heart has peaked, a tracer will again be given through the IV tube. After 20-40 minutes, you will get back on the exam table. More pictures will be taken of your heart. Depending on the tracer that is used,  more pictures may need to be taken 3-4 hours later. Your IV tube will be removed when the test is over. The test may vary among doctors and hospitals. What happens after the test? Ask your doctor: Whether you can return to your normal schedule, including diet, activities, travel, and medicines. Whether you should drink more fluids. This will help to remove the tracer from your body. Ask your doctor, or the department that is doing the test: When will my results be ready? How will I get my results? What are my treatment options? What other tests do I need? What are my next steps? This information is not intended to replace advice given to you by your health care provider. Make sure you discuss any questions you have with your health care provider. Document Revised: 04/13/2022 Document Reviewed:  04/13/2022 Elsevier Patient Education  2023 Elsevier Inc.  Echocardiogram An echocardiogram is a test that uses sound waves (ultrasound) to produce images of the heart. Images from an echocardiogram can provide important information about: Heart size and shape. The size and thickness and movement of your heart's walls. Heart muscle function and strength. Heart valve function or if you have stenosis. Stenosis is when the heart valves are too narrow. If blood is flowing backward through the heart valves (regurgitation). A tumor or infectious growth around the heart valves. Areas of heart muscle that are not working well because of poor blood flow or injury from a heart attack. Aneurysm detection. An aneurysm is a weak or damaged part of an artery wall. The wall bulges out from the normal force of blood pumping through the body. Tell a health care provider about: Any allergies you have. All medicines you are taking, including vitamins, herbs, eye drops, creams, and over-the-counter medicines. Any blood disorders you have. Any surgeries you have had. Any medical conditions you have. Whether you are pregnant or may be pregnant. What are the risks? Generally, this is a safe test. However, problems may occur, including an allergic reaction to dye (contrast) that may be used during the test. What happens before the test? No specific preparation is needed. You may eat and drink normally. What happens during the test?  You will take off your clothes from the waist up and put on a hospital gown. Electrodes or electrocardiogram (ECG)patches may be placed on your chest. The electrodes or patches are then connected to a device that monitors your heart rate and rhythm. You will lie down on a table for an ultrasound exam. A gel will be applied to your chest to help sound waves pass through your skin. A handheld device, called a transducer, will be pressed against your chest and moved over your heart.  The transducer produces sound waves that travel to your heart and bounce back (or "echo" back) to the transducer. These sound waves will be captured in real-time and changed into images of your heart that can be viewed on a video monitor. The images will be recorded on a computer and reviewed by your health care provider. You may be asked to change positions or hold your breath for a short time. This makes it easier to get different views or better views of your heart. In some cases, you may receive contrast through an IV in one of your veins. This can improve the quality of the pictures from your heart. The procedure may vary among health care providers and hospitals. What can I expect after the test? You may return to your normal, everyday  life, including diet, activities, and medicines, unless your health care provider tells you not to do that. Follow these instructions at home: It is up to you to get the results of your test. Ask your health care provider, or the department that is doing the test, when your results will be ready. Keep all follow-up visits. This is important. Summary An echocardiogram is a test that uses sound waves (ultrasound) to produce images of the heart. Images from an echocardiogram can provide important information about the size and shape of your heart, heart muscle function, heart valve function, and other possible heart problems. You do not need to do anything to prepare before this test. You may eat and drink normally. After the echocardiogram is completed, you may return to your normal, everyday life, unless your health care provider tells you not to do that. This information is not intended to replace advice given to you by your health care provider. Make sure you discuss any questions you have with your health care provider. Document Revised: 07/29/2021 Document Reviewed: 07/08/2020 Elsevier Patient Education  2023 Elsevier Inc.    Important Information About  [employer]

## 2023-10-20 NOTE — Progress Notes (Signed)
Cardiology Office Note:    Date:  10/20/2023   ID:  [name], DOB 1946-05-28, MRN [id number]  PCP:  Ardith Dark, MD  Cardiologist:  Garwin Brothers, MD   Referring MD: Ardith Dark, MD    ASSESSMENT:    1. Coronary artery disease involving native coronary artery of native heart with angina pectoris (HCC)   2. Hx of CABG   3. Dyslipidemia   4. Chest pain of uncertain etiology   5. Diabetes mellitus due to underlying condition with unspecified complications (HCC)    PLAN:    In order of problems listed above:  Chest pain: Coronary artery disease: I discussed my findings with the patient at length.  Occasionally has chest discomfort which is atypical however in view of established coronary artery disease I will put him in for an exercise stress Cardiolite.  He is agreeable.  Sublingual nitroglycerin prescription was sent, its protocol and 911 protocol explained and the patient vocalized understanding questions were answered to the patient's satisfaction Cardiac murmur: Echocardiogram will be done to assess murmur heard on auscultation. Essential hypertension: Blood pressure stable and diet was emphasized. Mixed dyslipidemia: On lipid-lowering medications send lipids from [facility] sheet were discussed.  Diet was explained for diabetes mellitus and he promises to do better. If the stress test is negative I told him to embark on a graded exercise program and eventually walk half an hour a day daily at least 5 days a week and he promises to do so. Patient will be seen in follow-up appointment in 9 months or earlier if the patient has any concerns.    Medication Adjustments/Labs and Tests Ordered: Current medicines are reviewed at length with the patient today.  Concerns regarding medicines are outlined above.  No orders of the defined types were placed in this encounter.  No orders of the defined types were placed in this encounter.    No chief complaint on file.     History of Present Illness:    Vincent Stevens is a 77 y.o. male.  Patient has past medical history of Neri artery disease, essential hypertension, mixed dyslipidemia and diabetes mellitus.  He denies any problems at this time.  He leads a sedentary lifestyle.  He occasionally complains of chest discomfort not related to exertion.  He cannot explain it in much detail..  No radiation to the neck or to the arms.  At the time of my evaluation, the patient is alert awake oriented and in no distress.  Past Medical History:  Diagnosis Date   Allergy    Angina, class III (HCC) 03/01/2018   BPH (benign prostatic hyperplasia) 09/19/2009   Qualifier: Diagnosis of  By: Vincent Cunas  MD, Vincent Stevens    Coronary artery disease involving native coronary artery of native heart with angina pectoris (HCC)    Dyslipidemia 08/14/2010   Qualifier: Diagnosis of  By: Vincent Cunas  MD, Vincent Stevens    Essential hypertension 01/17/2009   Qualifier: Diagnosis of  By: Vincent Cunas  MD, Vincent Stevens    GERD (gastroesophageal reflux disease) 09/09/2022   Gout    History of colonic polyps 09/27/2008   Qualifier: Diagnosis of  By: Vincent Cunas  MD, Vincent Stevens  Initial colonoscopy 2003.  Hyperplastic polyps only Followup colonoscopy 2008.  Normal  10 year interval recommended    Hx of CABG 03/01/2018   Left main coronary artery disease    Melanoma (HCC)    Pain in both lower extremities 08/15/2018   Prediabetes  09/13/2022    Past Surgical History:  Procedure Laterality Date   CORONARY ARTERY BYPASS GRAFT N/A 03/01/2018   Procedure: CORONARY ARTERY BYPASS GRAFTING (CABG) times three using the right saphaneous vein. Harvested endoscopicly; and left internal mammary artery.;  Surgeon: Vincent Borne, MD;  Location: [facility] OR;  Service: Open Heart Surgery;  Laterality: N/A;   HERNIA REPAIR     LEFT HEART CATH AND CORONARY ANGIOGRAPHY N/A 03/01/2018   Procedure: LEFT HEART CATH AND CORONARY ANGIOGRAPHY;  Surgeon: Vincent Lex, MD;   Location: [facility] INVASIVE CV LAB;  Service: Cardiovascular;  Laterality: N/A;   melanoma removal     TEE WITHOUT CARDIOVERSION N/A 03/01/2018   Procedure: TRANSESOPHAGEAL ECHOCARDIOGRAM (TEE);  Surgeon: Vincent Borne, MD;  Location: [facility] OR;  Service: Open Heart Surgery;  Laterality: N/A;    Current Medications: Current Meds  Medication Sig   aspirin EC 81 MG tablet Take 1 tablet (81 mg total) by mouth daily.   ibuprofen (ADVIL,MOTRIN) 200 MG tablet Take 200 mg by mouth 2 (two) times daily as needed (arthritis apin).    losartan (COZAAR) 50 MG tablet Take 1 tablet (50 mg total) by mouth 2 (two) times daily. Patient needs appointment for further refills. 1 st attempt   metFORMIN (GLUCOPHAGE) 500 MG tablet TAKE 1 TABLET EVERY DAY WITH BREAKFAST   Multiple Vitamin (MULTIVITAMIN WITH MINERALS) TABS tablet Take 1 tablet by mouth at bedtime.   pantoprazole (PROTONIX) 40 MG tablet Take 1 tablet (40 mg total) by mouth 2 (two) times daily.   simvastatin (ZOCOR) 80 MG tablet Take 1 tablet (80 mg total) by mouth daily. Patient needs an appointment for further refills. 3 rd/final attempt   tamsulosin (FLOMAX) 0.4 MG CAPS capsule TAKE 1 CAPSULE EVERY DAY   triamcinolone ointment (KENALOG) 0.5 % APPLY 1 APPLICATION TOPICALLY 2 (TWO) TIMES DAILY.     Allergies:   Sulfamethoxazole   Social History   Socioeconomic History   Marital status: Married    Spouse name: Not on file   Number of children: 1   Years of education: Not on file   Highest education level: Not on file  Occupational History   Occupation: Courier     Comment: Part time   Tobacco Use   Smoking status: Former    Current packs/day: 0.00    Types: Cigarettes    Quit date: 11/29/1980    Years since quitting: 42.9   Smokeless tobacco: Never  Vaping Use   Vaping status: Never Used  Substance and Sexual Activity   Alcohol use: No   Drug use: No   Sexual activity: Not on file  Other Topics Concern   Not on file  Social History  Narrative   Not on file   Social Determinants of Health   Financial Resource Strain: Low Risk  (02/15/2023)   Overall Financial Resource Strain (CARDIA)    Difficulty of Paying Living Expenses: Not hard at all  Food Insecurity: No Food Insecurity (02/15/2023)   Hunger Vital Sign    Worried About Running Out of Food in the Last Year: Never true    Ran Out of Food in the Last Year: Never true  Transportation Needs: No Transportation Needs (02/15/2023)   PRAPARE - Administrator, Civil Service (Medical): No    Lack of Transportation (Non-Medical): No  Physical Activity: Inactive (02/15/2023)   Exercise Vital Sign    Days of Exercise per Week: 0 days    Minutes of Exercise per  Session: 0 min  Stress: No Stress Concern Present (02/15/2023)   [employer] of Occupational Health - Occupational Stress Questionnaire    Feeling of Stress : Not at all  Social Connections: Moderately Isolated (02/15/2023)   Social Connection and Isolation Panel [NHANES]    Frequency of Communication with Friends and Family: More than three times a week    Frequency of Social Gatherings with Friends and Family: More than three times a week    Attends Religious Services: Never    Database administrator or Organizations: No    Attends Engineer, structural: Never    Marital Status: Married     Family History: The patient's family history includes Cancer in his sister; Dementia in his mother; Diabetes in an other family member; Heart attack in his father; Hypertension in an other family member.  ROS:   Please see the history of present illness.    All other systems reviewed and are negative.  EKGs/Labs/Other Studies Reviewed:    The following studies were reviewed today: I discussed my findings with the patient at length    Recent Labs: 09/12/2023: ALT 13; BUN 13; Creatinine, Ser 1.00; Hemoglobin 15.0; Platelets 170.0; Potassium 4.6; Sodium 140; TSH 3.96  Recent Lipid Panel     Component Value Date/Time   CHOL 137 09/12/2023 1021   CHOL 138 06/24/2022 0846   TRIG 64.0 09/12/2023 1021   HDL 53.70 09/12/2023 1021   HDL 52 06/24/2022 0846   CHOLHDL 3 09/12/2023 1021   VLDL 12.8 09/12/2023 1021   LDLCALC 71 09/12/2023 1021   LDLCALC 69 06/24/2022 0846    Physical Exam:    VS:  BP 138/70   Pulse 66   Ht 5\' 9"  (1.753 m)   Wt 194 lb (88 kg)   SpO2 97%   BMI 28.65 kg/m     Wt Readings from Last 3 Encounters:  10/20/23 194 lb (88 kg)  09/12/23 195 lb (88.5 kg)  03/14/23 194 lb 6.4 oz (88.2 kg)     GEN: Patient is in no acute distress HEENT: Normal NECK: No JVD; No carotid bruits LYMPHATICS: No lymphadenopathy CARDIAC: Hear sounds regular, 2/6 systolic murmur at the apex. RESPIRATORY:  Clear to auscultation without rales, wheezing or rhonchi  ABDOMEN: Soft, non-tender, non-distended MUSCULOSKELETAL:  No edema; No deformity  SKIN: Warm and dry NEUROLOGIC:  Alert and oriented x 3 PSYCHIATRIC:  Normal affect   Signed, Garwin Brothers, MD  10/20/2023 1:55 PM    [facility]

## 2023-10-21 ENCOUNTER — Telehealth (HOSPITAL_COMMUNITY): Payer: Self-pay | Admitting: *Deleted

## 2023-10-21 NOTE — Telephone Encounter (Signed)
Gave wife, Okey Regal detailed instructions for MPI study.

## 2023-10-24 DIAGNOSIS — L905 Scar conditions and fibrosis of skin: Secondary | ICD-10-CM | POA: Diagnosis not present

## 2023-10-24 DIAGNOSIS — L570 Actinic keratosis: Secondary | ICD-10-CM | POA: Diagnosis not present

## 2023-10-24 DIAGNOSIS — Z08 Encounter for follow-up examination after completed treatment for malignant neoplasm: Secondary | ICD-10-CM | POA: Diagnosis not present

## 2023-10-24 DIAGNOSIS — Z85820 Personal history of malignant melanoma of skin: Secondary | ICD-10-CM | POA: Diagnosis not present

## 2023-10-24 DIAGNOSIS — Z85828 Personal history of other malignant neoplasm of skin: Secondary | ICD-10-CM | POA: Diagnosis not present

## 2023-10-25 ENCOUNTER — Ambulatory Visit (HOSPITAL_COMMUNITY): Payer: Medicare HMO | Attending: Cardiology

## 2023-10-25 DIAGNOSIS — I25119 Atherosclerotic heart disease of native coronary artery with unspecified angina pectoris: Secondary | ICD-10-CM | POA: Diagnosis not present

## 2023-10-25 LAB — MYOCARDIAL PERFUSION IMAGING
LV dias vol: 82 mL (ref 62–150)
LV sys vol: 32 mL
Nuc Stress EF: 60 %
Peak HR: 85 {beats}/min
Rest HR: 52 {beats}/min
Rest Nuclear Isotope Dose: 10.4 mCi
SDS: 8
SRS: 3
SSS: 11
ST Depression (mm): 0 mm
Stress Nuclear Isotope Dose: 31.8 mCi
TID: 0.94

## 2023-10-25 MED ORDER — TECHNETIUM TC 99M TETROFOSMIN IV KIT
31.8000 | PACK | Freq: Once | INTRAVENOUS | Status: AC | PRN
  Administered 2023-10-25: 31.8 via INTRAVENOUS

## 2023-10-25 MED ORDER — REGADENOSON 0.4 MG/5ML IV SOLN
0.4000 mg | Freq: Once | INTRAVENOUS | Status: AC
  Administered 2023-10-25: 0.4 mg via INTRAVENOUS

## 2023-10-25 MED ORDER — TECHNETIUM TC 99M TETROFOSMIN IV KIT
10.4000 | PACK | Freq: Once | INTRAVENOUS | Status: AC | PRN
  Administered 2023-10-25: 10.4 via INTRAVENOUS

## 2023-11-01 ENCOUNTER — Other Ambulatory Visit: Payer: Self-pay

## 2023-11-02 ENCOUNTER — Encounter: Payer: Self-pay | Admitting: Cardiology

## 2023-11-02 ENCOUNTER — Ambulatory Visit: Payer: Medicare HMO | Attending: Cardiology | Admitting: Cardiology

## 2023-11-02 ENCOUNTER — Telehealth: Payer: Self-pay | Admitting: *Deleted

## 2023-11-02 VITALS — BP 126/68 | HR 59 | Ht 69.6 in | Wt 195.1 lb

## 2023-11-02 DIAGNOSIS — E088 Diabetes mellitus due to underlying condition with unspecified complications: Secondary | ICD-10-CM | POA: Diagnosis not present

## 2023-11-02 DIAGNOSIS — E785 Hyperlipidemia, unspecified: Secondary | ICD-10-CM

## 2023-11-02 DIAGNOSIS — Z951 Presence of aortocoronary bypass graft: Secondary | ICD-10-CM | POA: Diagnosis not present

## 2023-11-02 DIAGNOSIS — I209 Angina pectoris, unspecified: Secondary | ICD-10-CM

## 2023-11-02 DIAGNOSIS — I25119 Atherosclerotic heart disease of native coronary artery with unspecified angina pectoris: Secondary | ICD-10-CM | POA: Diagnosis not present

## 2023-11-02 HISTORY — DX: Angina pectoris, unspecified: I20.9

## 2023-11-02 NOTE — Patient Instructions (Signed)
Medication Instructions:  Your physician has recommended you make the following change in your medication:   Take 81 mg coated aspirin daily.  Use nitroglycerin 1 tablet placed under the tongue at the first sign of chest pain or an angina attack. 1 tablet may be used every 5 minutes as needed, for up to 15 minutes. Do not take more than 3 tablets in 15 minutes. If pain persist call 911 or go to the nearest ED.   *If you need a refill on your cardiac medications before your next appointment, please call your pharmacy*   Lab Work: Your physician recommends that you have a BMET and CBC today in the office for your upcoming procedure.  If you have labs (blood work) drawn today and your tests are completely normal, you will receive your results only by: MyChart Message (if you have MyChart) OR A paper copy in the mail If you have any lab test that is abnormal or we need to change your treatment, we will call you to review the results.   Testing/Procedures:  [name] A DEPT OF [facility] AT [facility] [place] [address] [place], [place] [address] [place] [address] Dept: [phone] Loc: [phone]  Vincent Stevens  11/02/2023  You are scheduled for a Cardiac Catheterization on Friday, December 6 with Dr.  Jacinto Halim .  1. Please arrive at the [facility] (Main Entrance A) at [facility]: [address] [place], [place] [address] at 5:30 AM (This time is 2 hour(s) before your procedure to ensure your preparation).   Free valet parking service is available. You will check in at ADMITTING. The support person will be asked to wait in the waiting room.  It is OK to have someone drop you off and come back when you are ready to be discharged.    Special note: Every effort is made to have your procedure done on time. Please understand that emergencies sometimes delay scheduled procedures.  2. Diet: Do not eat solid foods after  midnight.  The patient may have clear liquids until 5am upon the day of the procedure.  3. Labs: You had your labs done today.  4. Medication instructions in preparation for your procedure:   Contrast Allergy: No   Current Outpatient Medications (Endocrine & Metabolic):    metFORMIN (GLUCOPHAGE) 500 MG tablet, TAKE 1 TABLET EVERY DAY WITH BREAKFAST  Current Outpatient Medications (Cardiovascular):    losartan (COZAAR) 50 MG tablet, Take 1 tablet (50 mg total) by mouth 2 (two) times daily. Patient needs appointment for further refills. 1 st attempt   nitroGLYCERIN (NITROSTAT) 0.4 MG SL tablet, Place 0.4 mg under the tongue every 5 (five) minutes as needed for chest pain.   simvastatin (ZOCOR) 80 MG tablet, Take 1 tablet (80 mg total) by mouth daily. Patient needs an appointment for further refills. 3 rd/final attempt   Current Outpatient Medications (Analgesics):    aspirin EC 81 MG tablet, Take 1 tablet (81 mg total) by mouth daily.   ibuprofen (ADVIL,MOTRIN) 200 MG tablet, Take 200 mg by mouth 2 (two) times daily as needed (arthritis apin).    Current Outpatient Medications (Other):    Multiple Vitamin (MULTIVITAMIN WITH MINERALS) TABS tablet, Take 1 tablet by mouth at bedtime.   pantoprazole (PROTONIX) 40 MG tablet, Take 1 tablet (40 mg total) by mouth 2 (two) times daily.   tamsulosin (FLOMAX) 0.4 MG CAPS capsule, TAKE 1 CAPSULE EVERY DAY  triamcinolone ointment (KENALOG) 0.5 %, APPLY 1 APPLICATION TOPICALLY 2 (TWO) TIMES DAILY. *For reference purposes while preparing patient instructions.   Delete this med list prior to printing instructions for patient.*   Stop taking, Cozaar (Losartan) Friday, December 6,  Do not take Diabetes Med Glucophage (Metformin) on the day of the procedure and HOLD 48 HOURS AFTER THE PROCEDURE.  On the morning of your procedure, take your Aspirin 81 mg and any morning medicines NOT listed above.  You may use sips of water.  5. Plan to go home the  same day, you will only stay overnight if medically necessary. 6. Bring a current list of your medications and current insurance cards. 7. You MUST have a responsible person to drive you home. 8. Someone MUST be with you the first 24 hours after you arrive home or your discharge will be delayed. 9. Please wear clothes that are easy to get on and off and wear slip-on shoes.  Thank you for allowing [place] to care for you!   --  Invasive Cardiovascular services    Follow-Up: At [facility], you and your health needs are our priority.  As part of our continuing mission to provide you with exceptional heart care, we have created designated Provider Care Teams.  These Care Teams include your primary Cardiologist (physician) and Advanced Practice Providers (APPs -  Physician Assistants and Nurse Practitioners) who all work together to provide you with the care you need, when you need it.  We recommend signing up for the patient portal called "MyChart".  Sign up information is provided on this After Visit Summary.  MyChart is used to connect with patients for Virtual Visits (Telemedicine).  Patients are able to view lab/test results, encounter notes, upcoming appointments, etc.  Non-urgent messages can be sent to your provider as well.   To learn more about what you can do with MyChart, go to [email or web address].    Your next appointment:   4 month(s)  The format for your next appointment:   In Person  Provider:   Belva Crome, MD   Other Instructions  Coronary Angiogram With Stent Coronary angiogram with stent placement is a procedure to widen or open a narrow blood vessel of the heart (coronary artery). Arteries may become blocked by cholesterol buildup (plaques) in the lining of the artery wall. When a coronary artery becomes partially blocked, blood flow to that area decreases. This may lead to chest pain or a heart attack (myocardial infarction). A stent is a small piece  of metal that looks like mesh or spring. Stent placement may be done as treatment after a heart attack, or to prevent a heart attack if a blocked artery is found by a coronary angiogram. Let your health care provider know about: Any allergies you have, including allergies to medicines or contrast dye. All medicines you are taking, including vitamins, herbs, eye drops, creams, and over-the-counter medicines. Any problems you or family members have had with anesthetic medicines. Any blood disorders you have. Any surgeries you have had. Any medical conditions you have, including kidney problems or kidney failure. Whether you are pregnant or may be pregnant. Whether you are breastfeeding. What are the risks? Generally, this is a safe procedure. However, serious problems may occur, including: Damage to nearby structures or organs, such as the heart, blood vessels, or kidneys. A return of blockage. Bleeding, infection, or bruising at the insertion site. A collection of blood under the skin (hematoma) at the insertion site. A  blood clot in another part of the body. Allergic reaction to medicines or dyes. Bleeding into the abdomen (retroperitoneal bleeding). Stroke (rare). Heart attack (rare). What happens before the procedure? Staying hydrated Follow instructions from your health care provider about hydration, which may include: Up to 2 hours before the procedure - you may continue to drink clear liquids, such as water, clear fruit juice, black coffee, and plain tea.    Eating and drinking restrictions Follow instructions from your health care provider about eating and drinking, which may include: 8 hours before the procedure - stop eating heavy meals or foods, such as meat, fried foods, or fatty foods. 6 hours before the procedure - stop eating light meals or foods, such as toast or cereal. 2 hours before the procedure - stop drinking clear liquids. Medicines Ask your health care provider  about: Changing or stopping your regular medicines. This is especially important if you are taking diabetes medicines or blood thinners. Taking medicines such as aspirin and ibuprofen. These medicines can thin your blood. Do not take these medicines unless your health care provider tells you to take them. Generally, aspirin is recommended before a thin tube, called a catheter, is passed through a blood vessel and inserted into the heart (cardiac catheterization). Taking over-the-counter medicines, vitamins, herbs, and supplements. General instructions Do not use any products that contain nicotine or tobacco for at least 4 weeks before the procedure. These products include cigarettes, e-cigarettes, and chewing tobacco. If you need help quitting, ask your health care provider. Plan to have someone take you home from the hospital or clinic. If you will be going home right after the procedure, plan to have someone with you for 24 hours. You may have tests and imaging procedures. Ask your health care provider: How your insertion site will be marked. Ask which artery will be used for the procedure. What steps will be taken to help prevent infection. These may include: Removing hair at the insertion site. Washing skin with a germ-killing soap. Taking antibiotic medicine. What happens during the procedure? An IV will be inserted into one of your veins. Electrodes may be placed on your chest to monitor your heart rate during the procedure. You will be given one or more of the following: A medicine to help you relax (sedative). A medicine to numb the area (local anesthetic) for catheter insertion. A small incision will be made for catheter insertion. The catheter will be inserted into an artery using a guide wire. The location may be in your groin, your wrist, or the fold of your arm (near your elbow). An X-ray procedure (fluoroscopy) will be used to help guide the catheter to the opening of the heart  arteries. A dye will be injected into the catheter. X-rays will be taken. The dye helps to show where any narrowing or blockages are located in the arteries. Tell your health care provider if you have chest pain or trouble breathing. A tiny wire will be guided to the blocked spot, and a balloon will be inflated to make the artery wider. The stent will be expanded to crush the plaques into the wall of the vessel. The stent will hold the area open and improve the blood flow. Most stents have a drug coating to reduce the risk of the stent narrowing over time. The artery may be made wider using a drill, laser, or other tools that remove plaques. The catheter will be removed when the blood flow improves. The stent will stay where  it was placed, and the lining of the artery will grow over it. A bandage (dressing) will be placed on the insertion site. Pressure will be applied to stop bleeding. The IV will be removed. This procedure may vary among health care providers and hospitals.    What happens after the procedure? Your blood pressure, heart rate, breathing rate, and blood oxygen level will be monitored until you leave the hospital or clinic. If the procedure is done through the leg, you will lie flat in bed for a few hours or for as long as told by your health care provider. You will be instructed not to bend or cross your legs. The insertion site and the pulse in your foot or wrist will be checked often. You may have more blood tests, X-rays, and a test that records the electrical activity of your heart (electrocardiogram, or ECG). Do not drive for 24 hours if you were given a sedative during your procedure. Summary Coronary angiogram with stent placement is a procedure to widen or open a narrowed coronary artery. This is done to treat heart problems. Before the procedure, let your health care provider know about all the medical conditions and surgeries you have or have had. This is a safe  procedure. However, some problems may occur, including damage to nearby structures or organs, bleeding, blood clots, or allergies. Follow your health care provider's instructions about eating, drinking, medicines, and other lifestyle changes, such as quitting tobacco use before the procedure. This information is not intended to replace advice given to you by your health care provider. Make sure you discuss any questions you have with your health care provider. Document Revised: 06/06/2019 Document Reviewed: 06/06/2019 Elsevier Patient Education  2021 Elsevier Inc.  Aspirin and Your Heart Aspirin is a medicine that prevents the platelets in your blood from sticking together. Platelets are the cells that your blood uses for clotting. Aspirin can be used to help reduce the risk of blood clots, heart attacks, and other heart-related problems. What are the risks? Daily use of aspirin can cause side effects. Some of these include: Bleeding. Bleeding can be minor or serious. An example of minor bleeding is bleeding from a cut, and the bleeding does not stop. An example of more serious bleeding is stomach bleeding or, rarely, bleeding into the brain. Your risk of bleeding increases if you are also taking NSAIDs, such as ibuprofen. Increased bruising. Upset stomach. An allergic reaction. People who have growths inside the nose (nasal polyps) have an increased risk of developing an aspirin allergy. How to use aspirin to care for your heart Take aspirin only as told by your health care provider. Make sure that you understand how much to take and what form to take. The two forms of aspirin are: Non-enteric-coated.This type of aspirin does not have a coating and is absorbed quickly. This type of aspirin also comes in a chewable form. Enteric-coated. This type of aspirin has a coating that releases the medicine very slowly. Enteric-coated aspirin might cause less stomach upset than non-enteric-coated aspirin.  This type of aspirin should not be chewed or crushed. Work with your health care provider to find out whether it is safe and beneficial for you to take aspirin daily. Taking aspirin daily may be helpful if: You have had a heart attack or chest pain, or you are at risk for a heart attack. You have a condition in which certain heart vessels are blocked (coronary artery disease), and you have had a procedure  to treat it. Examples are: Open-heart surgery, such as coronary artery bypass surgery (CABG). Coronary angioplasty,which is done to widen a blood vessel of your heart. Having a small mesh tube, or stent, placed in your coronary artery. You have had certain types of stroke or a mini-stroke known as a transient ischemic attack (TIA). You have a narrowing of the arteries that supply the limbs (peripheral artery disease, or PAD). You have long-term (chronic) heart rhythm problems, such as atrial fibrillation, and your health care provider thinks aspirin may help. You have valve disease or have had surgery on a valve. You are considered at increased risk of developing coronary artery disease or PAD.    Follow these instructions at home Medicines Take over-the-counter and prescription medicines only as told by your health care provider. If you are taking blood thinners: Talk with your health care provider before you take any medicines that contain aspirin or NSAIDs, such as ibuprofen. These medicines increase your risk for dangerous bleeding. Take your medicine exactly as told, at the same time every day. Avoid activities that could cause injury or bruising, and follow instructions about how to prevent falls. Wear a medical alert bracelet or carry a card that lists what medicines you take. General instructions Do not drink alcohol if: Your health care provider tells you not to drink. You are pregnant, may be pregnant, or are planning to become pregnant. If you drink alcohol: Limit how much you  use to: 0-1 drink a day for women. 0-2 drinks a day for men. Be aware of how much alcohol is in your drink. In the [place] one drink equals one 12 oz bottle of beer (355 mL), one 5 oz glass of wine (148 mL), or one 1 oz glass of hard liquor (44 mL). Keep all follow-up visits as told by your health care provider. This is important. Where to find more information The American Heart Association: www.heart.org Contact a health care provider if you have: Unusual bleeding or bruising. Stomach pain or nausea. Ringing in your ears. An allergic reaction that causes hives, itchy skin, or swelling of the lips, tongue, or face. Get help right away if: You notice that your bowel movements are bloody, or dark red or black in color. You vomit or cough up blood. You have blood in your urine. You cough, breathe loudly (wheeze), or feel short of breath. You have chest pain, especially if the pain spreads to your arms, back, neck, or jaw. You have a headache with confusion. You have any symptoms of a stroke. "BE FAST" is an easy way to remember the main warning signs of a stroke: B - Balance. Signs are dizziness, sudden trouble walking, or loss of balance. E - Eyes. Signs are trouble seeing or a sudden change in vision. F - Face. Signs are sudden weakness or numbness of the face, or the face or eyelid drooping on one side. A - Arms. Signs are weakness or numbness in an arm. This happens suddenly and usually on one side of the body. S - Speech. Signs are sudden trouble speaking, slurred speech, or trouble understanding what people say. T - Time. Time to call emergency services. Write down what time symptoms started. You have other signs of a stroke, such as: A sudden, severe headache with no known cause. Nausea or vomiting. Seizure. These symptoms may represent a serious problem that is an emergency. Do not wait to see if the symptoms will go away. Get medical help right away. Call  your local emergency  services (911 in the [place] Do not drive yourself to the hospital. Summary Aspirin use can help reduce the risk of blood clots, heart attacks, and other heart-related problems. Daily use of aspirin can cause side effects. Take aspirin only as told by your health care provider. Make sure that you understand how much to take and what form to take. Your health care provider will help you determine whether it is safe and beneficial for you to take aspirin daily. This information is not intended to replace advice given to you by your health care provider. Make sure you discuss any questions you have with your health care provider. Document Revised: 08/20/2019 Document Reviewed: 08/20/2019 Elsevier Patient Education  2021 Elsevier Inc. Nitroglycerin sublingual tablets What is this medicine? NITROGLYCERIN (nye troe GLI ser in) is a type of vasodilator. It relaxes blood vessels, increasing the blood and oxygen supply to your heart. This medicine is used to relieve chest pain caused by angina. It is also used to prevent chest pain before activities like climbing stairs, going outdoors in cold weather, or sexual activity. This medicine may be used for other purposes; ask your health care provider or pharmacist if you have questions. COMMON BRAND NAME(S): Nitroquick, Nitrostat, Nitrotab What should I tell my health care provider before I take this medicine? They need to know if you have any of these conditions: anemia head injury, recent stroke, or bleeding in the brain liver disease previous heart attack an unusual or allergic reaction to nitroglycerin, other medicines, foods, dyes, or preservatives pregnant or trying to get pregnant breast-feeding How should I use this medicine? Take this medicine by mouth as needed. Use at the first sign of an angina attack (chest pain or tightness). You can also take this medicine 5 to 10 minutes before an event likely to produce chest pain. Follow the directions  exactly as written on the prescription label. Place one tablet under your tongue and let it dissolve. Do not swallow whole. Replace the dose if you accidentally swallow it. It will help if your mouth is not dry. Saliva around the tablet will help it to dissolve more quickly. Do not eat or drink, smoke or chew tobacco while a tablet is dissolving. Sit down when taking this medicine. In an angina attack, you should feel better within 5 minutes after your first dose. You can take a dose every 5 minutes up to a total of 3 doses. If you do not feel better or feel worse after 1 dose, call 9-1-1 at once. Do not take more than 3 doses in 15 minutes. Your health care provider might give you other directions. Follow those directions if he or she does. Do not take your medicine more often than directed. Talk to your health care provider about the use of this medicine in children. Special care may be needed. Overdosage: If you think you have taken too much of this medicine contact a poison control center or emergency room at once. NOTE: This medicine is only for you. Do not share this medicine with others. What if I miss a dose? This does not apply. This medicine is only used as needed. What may interact with this medicine? Do not take this medicine with any of the following medications: certain migraine medicines like ergotamine and dihydroergotamine (DHE) medicines used to treat erectile dysfunction like sildenafil, tadalafil, and vardenafil riociguat This medicine may also interact with the following medications: alteplase aspirin heparin medicines for high blood pressure medicines  for mental depression other medicines used to treat angina phenothiazines like chlorpromazine, mesoridazine, prochlorperazine, thioridazine This list may not describe all possible interactions. Give your health care provider a list of all the medicines, herbs, non-prescription drugs, or dietary supplements you use. Also tell  them if you smoke, drink alcohol, or use illegal drugs. Some items may interact with your medicine. What should I watch for while using this medicine? Tell your doctor or health care professional if you feel your medicine is no longer working. Keep this medicine with you at all times. Sit or lie down when you take your medicine to prevent falling if you feel dizzy or faint after using it. Try to remain calm. This will help you to feel better faster. If you feel dizzy, take several deep breaths and lie down with your feet propped up, or bend forward with your head resting between your knees. You may get drowsy or dizzy. Do not drive, use machinery, or do anything that needs mental alertness until you know how this drug affects you. Do not stand or sit up quickly, especially if you are an older patient. This reduces the risk of dizzy or fainting spells. Alcohol can make you more drowsy and dizzy. Avoid alcoholic drinks. Do not treat yourself for coughs, colds, or pain while you are taking this medicine without asking your doctor or health care professional for advice. Some ingredients may increase your blood pressure. What side effects may I notice from receiving this medicine? Side effects that you should report to your doctor or health care professional as soon as possible: allergic reactions (skin rash, itching or hives; swelling of the face, lips, or tongue) low blood pressure (dizziness; feeling faint or lightheaded, falls; unusually weak or tired) low red blood cell counts (trouble breathing; feeling faint; lightheaded, falls; unusually weak or tired) Side effects that usually do not require medical attention (report to your doctor or health care professional if they continue or are bothersome): facial flushing (redness) headache nausea, vomiting This list may not describe all possible side effects. Call your doctor for medical advice about side effects. You may report side effects to FDA at  1-800-FDA-1088. Where should I keep my medicine? Keep out of the reach of children. Store at room temperature between 20 and 25 degrees C (68 and 77 degrees F). Store in Retail buyer. Protect from light and moisture. Keep tightly closed. Throw away any unused medicine after the expiration date. NOTE: This sheet is a summary. It may not cover all possible information. If you have questions about this medicine, talk to your doctor, pharmacist, or health care provider.  2021 Elsevier/Gold Standard (2018-08-16 16:46:32)

## 2023-11-02 NOTE — H&P (View-Only) (Signed)
 Cardiology Office Note:    Date:  11/02/2023   [name], DOB 1946-08-13, MRN [id number]  PCP:  Ardith Dark, MD  Cardiologist:  Garwin Brothers, MD   Referring MD: Ardith Dark, MD    ASSESSMENT:    1. Coronary artery disease involving native coronary artery of native heart with angina pectoris (HCC)   2. Angina pectoris (HCC)   3. Diabetes mellitus due to underlying condition with unspecified complications (HCC)   4. Hx of CABG   5. Dyslipidemia    PLAN:    In order of problems listed above:  Angina pectoris: Coronary artery disease: Secondary prevention stressed with the patient.  Importance of compliance with diet medication stressed any vocalized understanding.  His symptoms are concerning and following recommendations were made to him.I discussed coronary angiography and left heart catheterization with the patient at extensive length. Procedure, benefits and potential risks were explained. Patient had multiple questions which were answered to the patient's satisfaction. Patient agreed and consented for the procedure. Further recommendations will be made based on the findings of the coronary angiography. In the interim. The patient has any significant symptoms he knows to go to the nearest emergency room. Mixed dyslipidemia: On lipid-lowering medications.  Lipids reviewed and diet discussed with the patient at length. Diabetes mellitus: Managed by primary care.  Diet emphasized.  He promises to do better. Patient will be seen in follow-up appointment in 6 months or earlier if the patient has any concerns.    Medication Adjustments/Labs and Tests Ordered: Current medicines are reviewed at length with the patient today.  Concerns regarding medicines are outlined above.  Orders Placed This Encounter  Procedures   EKG 12-Lead   No orders of the defined types were placed in this encounter.    No chief complaint on file.    History of Present Illness:     Vincent Stevens is a 77 y.o. male.  Patient has past medical history of coronary artery disease post CABG surgery, mixed dyslipidemia and diabetes mellitus.  He is an active gentleman.  His stress test was abnormal.  He mentions to me that he was in a little bit of a rush to come to the doctor's office this morning and he had an episode of chest tightness completely resolved with nitroglycerin.  He subsequently has had no issues.  He came to the doctor's office this morning without any other problems.  At the time of my evaluation, the patient is alert awake oriented and in no distress.  Past Medical History:  Diagnosis Date   Allergy    Angina, class III (HCC) 03/01/2018   BPH (benign prostatic hyperplasia) 09/19/2009   Qualifier: Diagnosis of  By: Amador Cunas  MD, Janett Labella    Chest pain of uncertain etiology 10/20/2023   Coronary artery disease involving native coronary artery of native heart with angina pectoris (HCC)    Diabetes mellitus due to underlying condition with unspecified complications (HCC) 10/20/2023   Dyslipidemia 08/14/2010   Qualifier: Diagnosis of  By: Amador Cunas  MD, Janett Labella    Essential hypertension 01/17/2009   Qualifier: Diagnosis of  By: Amador Cunas  MD, Janett Labella    GERD (gastroesophageal reflux disease) 09/09/2022   Gout    History of colonic polyps 09/27/2008   Qualifier: Diagnosis of  By: Amador Cunas  MD, Janett Labella  Initial colonoscopy 2003.  Hyperplastic polyps only Followup colonoscopy 2008.  Normal  10 year interval recommended    Hx  of CABG 03/01/2018   Left main coronary artery disease    Melanoma (HCC)    Pain in both lower extremities 08/15/2018   Prediabetes 09/13/2022    Past Surgical History:  Procedure Laterality Date   CORONARY ARTERY BYPASS GRAFT N/A 03/01/2018   Procedure: CORONARY ARTERY BYPASS GRAFTING (CABG) times three using the right saphaneous vein. Harvested endoscopicly; and left internal mammary artery.;  Surgeon: Alleen Borne, MD;   Location: [facility] OR;  Service: Open Heart Surgery;  Laterality: N/A;   HERNIA REPAIR     LEFT HEART CATH AND CORONARY ANGIOGRAPHY N/A 03/01/2018   Procedure: LEFT HEART CATH AND CORONARY ANGIOGRAPHY;  Surgeon: Marykay Lex, MD;  Location: [facility] INVASIVE CV LAB;  Service: Cardiovascular;  Laterality: N/A;   melanoma removal     TEE WITHOUT CARDIOVERSION N/A 03/01/2018   Procedure: TRANSESOPHAGEAL ECHOCARDIOGRAM (TEE);  Surgeon: Alleen Borne, MD;  Location: [facility] OR;  Service: Open Heart Surgery;  Laterality: N/A;    Current Medications: Current Meds  Medication Sig   aspirin EC 81 MG tablet Take 1 tablet (81 mg total) by mouth daily.   ibuprofen (ADVIL,MOTRIN) 200 MG tablet Take 200 mg by mouth 2 (two) times daily as needed (arthritis apin).    losartan (COZAAR) 50 MG tablet Take 1 tablet (50 mg total) by mouth 2 (two) times daily. Patient needs appointment for further refills. 1 st attempt   metFORMIN (GLUCOPHAGE) 500 MG tablet TAKE 1 TABLET EVERY DAY WITH BREAKFAST   Multiple Vitamin (MULTIVITAMIN WITH MINERALS) TABS tablet Take 1 tablet by mouth at bedtime.   nitroGLYCERIN (NITROSTAT) 0.4 MG SL tablet Place 0.4 mg under the tongue every 5 (five) minutes as needed for chest pain.   pantoprazole (PROTONIX) 40 MG tablet Take 1 tablet (40 mg total) by mouth 2 (two) times daily.   simvastatin (ZOCOR) 80 MG tablet Take 1 tablet (80 mg total) by mouth daily. Patient needs an appointment for further refills. 3 rd/final attempt   tamsulosin (FLOMAX) 0.4 MG CAPS capsule TAKE 1 CAPSULE EVERY DAY   triamcinolone ointment (KENALOG) 0.5 % APPLY 1 APPLICATION TOPICALLY 2 (TWO) TIMES DAILY.     Allergies:   Sulfamethoxazole   Social History   Socioeconomic History   Marital status: Married    Spouse name: Not on file   Number of children: 1   Years of education: Not on file   Highest education level: Not on file  Occupational History   Occupation: Courier     Comment: Part time   Tobacco Use    Smoking status: Former    Current packs/day: 0.00    Types: Cigarettes    Quit date: 11/29/1980    Years since quitting: 42.9   Smokeless tobacco: Never  Vaping Use   Vaping status: Never Used  Substance and Sexual Activity   Alcohol use: No   Drug use: No   Sexual activity: Not on file  Other Topics Concern   Not on file  Social History Narrative   Not on file   Social Determinants of Health   Financial Resource Strain: Low Risk  (02/15/2023)   Overall Financial Resource Strain (CARDIA)    Difficulty of Paying Living Expenses: Not hard at all  Food Insecurity: No Food Insecurity (02/15/2023)   Hunger Vital Sign    Worried About Running Out of Food in the Last Year: Never true    Ran Out of Food in the Last Year: Never true  Transportation Needs: No Transportation  Needs (02/15/2023)   PRAPARE - Administrator, Civil Service (Medical): No    Lack of Transportation (Non-Medical): No  Physical Activity: Inactive (02/15/2023)   Exercise Vital Sign    Days of Exercise per Week: 0 days    Minutes of Exercise per Session: 0 min  Stress: No Stress Concern Present (02/15/2023)   [employer] of Occupational Health - Occupational Stress Questionnaire    Feeling of Stress : Not at all  Social Connections: Moderately Isolated (02/15/2023)   Social Connection and Isolation Panel [NHANES]    Frequency of Communication with Friends and Family: More than three times a week    Frequency of Social Gatherings with Friends and Family: More than three times a week    Attends Religious Services: Never    Database administrator or Organizations: No    Attends Engineer, structural: Never    Marital Status: Married     Family History: The patient's family history includes Cancer in his sister; Dementia in his mother; Diabetes in an other family member; Heart attack in his father; Hypertension in an other family member.  ROS:   Please see the history of present illness.     All other systems reviewed and are negative.  EKGs/Labs/Other Studies Reviewed:    The following studies were reviewed today: .Marland KitchenEKG Interpretation Date/Time:  Wednesday November 02 2023 08:04:49 EST Ventricular Rate:  59 PR Interval:  108 QRS Duration:  82 QT Interval:  438 QTC Calculation: 433 R Axis:   -19  Text Interpretation: Sinus bradycardia with short PR with Premature atrial complexes Minimal voltage criteria for LVH, may be normal variant ( R in aVL ) Possible Anterior infarct , age undetermined When compared with ECG of 20-Oct-2020 09:21, Borderline criteria for Anterior infarct are now Present T wave inversion now evident in Inferior leads Nonspecific T wave abnormality, improved in Lateral leads Confirmed by Belva Crome [phone]) on 11/02/2023 8:14:50 AM     Recent Labs: 09/12/2023: ALT 13; BUN 13; Creatinine, Ser 1.00; Hemoglobin 15.0; Platelets 170.0; Potassium 4.6; Sodium 140; TSH 3.96  Recent Lipid Panel    Component Value Date/Time   CHOL 137 09/12/2023 1021   CHOL 138 06/24/2022 0846   TRIG 64.0 09/12/2023 1021   HDL 53.70 09/12/2023 1021   HDL 52 06/24/2022 0846   CHOLHDL 3 09/12/2023 1021   VLDL 12.8 09/12/2023 1021   LDLCALC 71 09/12/2023 1021   LDLCALC 69 06/24/2022 0846    Physical Exam:    VS:  BP 126/68   Pulse (!) 59   Ht 5' 9.6" (1.768 m)   Wt 195 lb 1.3 oz (88.5 kg)   SpO2 96%   BMI 28.31 kg/m     Wt Readings from Last 3 Encounters:  11/02/23 195 lb 1.3 oz (88.5 kg)  10/25/23 194 lb (88 kg)  10/20/23 194 lb (88 kg)     GEN: Patient is in no acute distress HEENT: Normal NECK: No JVD; No carotid bruits LYMPHATICS: No lymphadenopathy CARDIAC: Hear sounds regular, 2/6 systolic murmur at the apex. RESPIRATORY:  Clear to auscultation without rales, wheezing or rhonchi  ABDOMEN: Soft, non-tender, non-distended MUSCULOSKELETAL:  No edema; No deformity  SKIN: Warm and dry NEUROLOGIC:  Alert and oriented x 3 PSYCHIATRIC:  Normal affect    Signed, Garwin Brothers, MD  11/02/2023 8:24 AM    [facility]

## 2023-11-02 NOTE — Progress Notes (Signed)
Cardiology Office Note:    Date:  11/02/2023   [name], DOB 1946-08-13, MRN [id number]  PCP:  Ardith Dark, MD  Cardiologist:  Garwin Brothers, MD   Referring MD: Ardith Dark, MD    ASSESSMENT:    1. Coronary artery disease involving native coronary artery of native heart with angina pectoris (HCC)   2. Angina pectoris (HCC)   3. Diabetes mellitus due to underlying condition with unspecified complications (HCC)   4. Hx of CABG   5. Dyslipidemia    PLAN:    In order of problems listed above:  Angina pectoris: Coronary artery disease: Secondary prevention stressed with the patient.  Importance of compliance with diet medication stressed any vocalized understanding.  His symptoms are concerning and following recommendations were made to him.I discussed coronary angiography and left heart catheterization with the patient at extensive length. Procedure, benefits and potential risks were explained. Patient had multiple questions which were answered to the patient's satisfaction. Patient agreed and consented for the procedure. Further recommendations will be made based on the findings of the coronary angiography. In the interim. The patient has any significant symptoms he knows to go to the nearest emergency room. Mixed dyslipidemia: On lipid-lowering medications.  Lipids reviewed and diet discussed with the patient at length. Diabetes mellitus: Managed by primary care.  Diet emphasized.  He promises to do better. Patient will be seen in follow-up appointment in 6 months or earlier if the patient has any concerns.    Medication Adjustments/Labs and Tests Ordered: Current medicines are reviewed at length with the patient today.  Concerns regarding medicines are outlined above.  Orders Placed This Encounter  Procedures   EKG 12-Lead   No orders of the defined types were placed in this encounter.    No chief complaint on file.    History of Present Illness:     Vincent Stevens is a 77 y.o. male.  Patient has past medical history of coronary artery disease post CABG surgery, mixed dyslipidemia and diabetes mellitus.  He is an active gentleman.  His stress test was abnormal.  He mentions to me that he was in a little bit of a rush to come to the doctor's office this morning and he had an episode of chest tightness completely resolved with nitroglycerin.  He subsequently has had no issues.  He came to the doctor's office this morning without any other problems.  At the time of my evaluation, the patient is alert awake oriented and in no distress.  Past Medical History:  Diagnosis Date   Allergy    Angina, class III (HCC) 03/01/2018   BPH (benign prostatic hyperplasia) 09/19/2009   Qualifier: Diagnosis of  By: Amador Cunas  MD, Janett Labella    Chest pain of uncertain etiology 10/20/2023   Coronary artery disease involving native coronary artery of native heart with angina pectoris (HCC)    Diabetes mellitus due to underlying condition with unspecified complications (HCC) 10/20/2023   Dyslipidemia 08/14/2010   Qualifier: Diagnosis of  By: Amador Cunas  MD, Janett Labella    Essential hypertension 01/17/2009   Qualifier: Diagnosis of  By: Amador Cunas  MD, Janett Labella    GERD (gastroesophageal reflux disease) 09/09/2022   Gout    History of colonic polyps 09/27/2008   Qualifier: Diagnosis of  By: Amador Cunas  MD, Janett Labella  Initial colonoscopy 2003.  Hyperplastic polyps only Followup colonoscopy 2008.  Normal  10 year interval recommended    Hx  of CABG 03/01/2018   Left main coronary artery disease    Melanoma (HCC)    Pain in both lower extremities 08/15/2018   Prediabetes 09/13/2022    Past Surgical History:  Procedure Laterality Date   CORONARY ARTERY BYPASS GRAFT N/A 03/01/2018   Procedure: CORONARY ARTERY BYPASS GRAFTING (CABG) times three using the right saphaneous vein. Harvested endoscopicly; and left internal mammary artery.;  Surgeon: Alleen Borne, MD;   Location: [facility] OR;  Service: Open Heart Surgery;  Laterality: N/A;   HERNIA REPAIR     LEFT HEART CATH AND CORONARY ANGIOGRAPHY N/A 03/01/2018   Procedure: LEFT HEART CATH AND CORONARY ANGIOGRAPHY;  Surgeon: Marykay Lex, MD;  Location: [facility] INVASIVE CV LAB;  Service: Cardiovascular;  Laterality: N/A;   melanoma removal     TEE WITHOUT CARDIOVERSION N/A 03/01/2018   Procedure: TRANSESOPHAGEAL ECHOCARDIOGRAM (TEE);  Surgeon: Alleen Borne, MD;  Location: [facility] OR;  Service: Open Heart Surgery;  Laterality: N/A;    Current Medications: Current Meds  Medication Sig   aspirin EC 81 MG tablet Take 1 tablet (81 mg total) by mouth daily.   ibuprofen (ADVIL,MOTRIN) 200 MG tablet Take 200 mg by mouth 2 (two) times daily as needed (arthritis apin).    losartan (COZAAR) 50 MG tablet Take 1 tablet (50 mg total) by mouth 2 (two) times daily. Patient needs appointment for further refills. 1 st attempt   metFORMIN (GLUCOPHAGE) 500 MG tablet TAKE 1 TABLET EVERY DAY WITH BREAKFAST   Multiple Vitamin (MULTIVITAMIN WITH MINERALS) TABS tablet Take 1 tablet by mouth at bedtime.   nitroGLYCERIN (NITROSTAT) 0.4 MG SL tablet Place 0.4 mg under the tongue every 5 (five) minutes as needed for chest pain.   pantoprazole (PROTONIX) 40 MG tablet Take 1 tablet (40 mg total) by mouth 2 (two) times daily.   simvastatin (ZOCOR) 80 MG tablet Take 1 tablet (80 mg total) by mouth daily. Patient needs an appointment for further refills. 3 rd/final attempt   tamsulosin (FLOMAX) 0.4 MG CAPS capsule TAKE 1 CAPSULE EVERY DAY   triamcinolone ointment (KENALOG) 0.5 % APPLY 1 APPLICATION TOPICALLY 2 (TWO) TIMES DAILY.     Allergies:   Sulfamethoxazole   Social History   Socioeconomic History   Marital status: Married    Spouse name: Not on file   Number of children: 1   Years of education: Not on file   Highest education level: Not on file  Occupational History   Occupation: Courier     Comment: Part time   Tobacco Use    Smoking status: Former    Current packs/day: 0.00    Types: Cigarettes    Quit date: 11/29/1980    Years since quitting: 42.9   Smokeless tobacco: Never  Vaping Use   Vaping status: Never Used  Substance and Sexual Activity   Alcohol use: No   Drug use: No   Sexual activity: Not on file  Other Topics Concern   Not on file  Social History Narrative   Not on file   Social Determinants of Health   Financial Resource Strain: Low Risk  (02/15/2023)   Overall Financial Resource Strain (CARDIA)    Difficulty of Paying Living Expenses: Not hard at all  Food Insecurity: No Food Insecurity (02/15/2023)   Hunger Vital Sign    Worried About Running Out of Food in the Last Year: Never true    Ran Out of Food in the Last Year: Never true  Transportation Needs: No Transportation  Needs (02/15/2023)   PRAPARE - Administrator, Civil Service (Medical): No    Lack of Transportation (Non-Medical): No  Physical Activity: Inactive (02/15/2023)   Exercise Vital Sign    Days of Exercise per Week: 0 days    Minutes of Exercise per Session: 0 min  Stress: No Stress Concern Present (02/15/2023)   [employer] of Occupational Health - Occupational Stress Questionnaire    Feeling of Stress : Not at all  Social Connections: Moderately Isolated (02/15/2023)   Social Connection and Isolation Panel [NHANES]    Frequency of Communication with Friends and Family: More than three times a week    Frequency of Social Gatherings with Friends and Family: More than three times a week    Attends Religious Services: Never    Database administrator or Organizations: No    Attends Engineer, structural: Never    Marital Status: Married     Family History: The patient's family history includes Cancer in his sister; Dementia in his mother; Diabetes in an other family member; Heart attack in his father; Hypertension in an other family member.  ROS:   Please see the history of present illness.     All other systems reviewed and are negative.  EKGs/Labs/Other Studies Reviewed:    The following studies were reviewed today: .Marland KitchenEKG Interpretation Date/Time:  Wednesday November 02 2023 08:04:49 EST Ventricular Rate:  59 PR Interval:  108 QRS Duration:  82 QT Interval:  438 QTC Calculation: 433 R Axis:   -19  Text Interpretation: Sinus bradycardia with short PR with Premature atrial complexes Minimal voltage criteria for LVH, may be normal variant ( R in aVL ) Possible Anterior infarct , age undetermined When compared with ECG of 20-Oct-2020 09:21, Borderline criteria for Anterior infarct are now Present T wave inversion now evident in Inferior leads Nonspecific T wave abnormality, improved in Lateral leads Confirmed by Belva Crome [phone]) on 11/02/2023 8:14:50 AM     Recent Labs: 09/12/2023: ALT 13; BUN 13; Creatinine, Ser 1.00; Hemoglobin 15.0; Platelets 170.0; Potassium 4.6; Sodium 140; TSH 3.96  Recent Lipid Panel    Component Value Date/Time   CHOL 137 09/12/2023 1021   CHOL 138 06/24/2022 0846   TRIG 64.0 09/12/2023 1021   HDL 53.70 09/12/2023 1021   HDL 52 06/24/2022 0846   CHOLHDL 3 09/12/2023 1021   VLDL 12.8 09/12/2023 1021   LDLCALC 71 09/12/2023 1021   LDLCALC 69 06/24/2022 0846    Physical Exam:    VS:  BP 126/68   Pulse (!) 59   Ht 5' 9.6" (1.768 m)   Wt 195 lb 1.3 oz (88.5 kg)   SpO2 96%   BMI 28.31 kg/m     Wt Readings from Last 3 Encounters:  11/02/23 195 lb 1.3 oz (88.5 kg)  10/25/23 194 lb (88 kg)  10/20/23 194 lb (88 kg)     GEN: Patient is in no acute distress HEENT: Normal NECK: No JVD; No carotid bruits LYMPHATICS: No lymphadenopathy CARDIAC: Hear sounds regular, 2/6 systolic murmur at the apex. RESPIRATORY:  Clear to auscultation without rales, wheezing or rhonchi  ABDOMEN: Soft, non-tender, non-distended MUSCULOSKELETAL:  No edema; No deformity  SKIN: Warm and dry NEUROLOGIC:  Alert and oriented x 3 PSYCHIATRIC:  Normal affect    Signed, Garwin Brothers, MD  11/02/2023 8:24 AM    [facility]

## 2023-11-02 NOTE — Telephone Encounter (Signed)
Cath time has been changed to 9:30 on 12/6.  Arrival time is 7:30 AM .  Patient made aware

## 2023-11-03 ENCOUNTER — Telehealth: Payer: Self-pay | Admitting: *Deleted

## 2023-11-03 LAB — CBC
Hematocrit: 48.2 % (ref 37.5–51.0)
Hemoglobin: 15.9 g/dL (ref 13.0–17.7)
MCH: 29.3 pg (ref 26.6–33.0)
MCHC: 33 g/dL (ref 31.5–35.7)
MCV: 89 fL (ref 79–97)
Platelets: 193 10*3/uL (ref 150–450)
RBC: 5.43 x10E6/uL (ref 4.14–5.80)
RDW: 13.1 % (ref 11.6–15.4)
WBC: 6.7 10*3/uL (ref 3.4–10.8)

## 2023-11-03 LAB — BASIC METABOLIC PANEL
BUN/Creatinine Ratio: 15 (ref 10–24)
BUN: 16 mg/dL (ref 8–27)
CO2: 24 mmol/L (ref 20–29)
Calcium: 9.7 mg/dL (ref 8.6–10.2)
Chloride: 103 mmol/L (ref 96–106)
Creatinine, Ser: 1.05 mg/dL (ref 0.76–1.27)
Glucose: 104 mg/dL — ABNORMAL HIGH (ref 70–99)
Potassium: 5.1 mmol/L (ref 3.5–5.2)
Sodium: 142 mmol/L (ref 134–144)
eGFR: 73 mL/min/{1.73_m2} (ref >59)

## 2023-11-03 NOTE — Telephone Encounter (Signed)
Cardiac Catheterization scheduled at [facility] for: Friday November 04, 2023 9:30 AM Arrival time [facility] Main Entrance A at: 7:30 AM  Nothing to eat after midnight prior to procedure, clear liquids until 5 AM day of procedure.  Medication instructions: -Hold:  Metformin-day of procedure and 48 hours post procedure -Other usual morning medications can be taken with sips of water including aspirin 81 mg.  Plan to go home the same day, you will only stay overnight if medically necessary.  You must have responsible adult to drive you home.  Someone must be with you the first 24 hours after you arrive home.   Reviewed procedure instructions with patient.

## 2023-11-04 ENCOUNTER — Ambulatory Visit (HOSPITAL_COMMUNITY)
Admission: RE | Admit: 2023-11-04 | Discharge: 2023-11-04 | Disposition: A | Discharge status: Home / Self Care | Payer: Medicare HMO | Attending: Cardiology | Admitting: Cardiology

## 2023-11-04 ENCOUNTER — Encounter (HOSPITAL_COMMUNITY)
Admission: RE | Disposition: A | Discharge status: Home / Self Care | Payer: Self-pay | Source: Home / Self Care | Attending: Cardiology

## 2023-11-04 ENCOUNTER — Telehealth: Payer: Self-pay

## 2023-11-04 ENCOUNTER — Other Ambulatory Visit: Payer: Self-pay

## 2023-11-04 DIAGNOSIS — Z87891 Personal history of nicotine dependence: Secondary | ICD-10-CM | POA: Diagnosis not present

## 2023-11-04 DIAGNOSIS — I10 Essential (primary) hypertension: Secondary | ICD-10-CM | POA: Diagnosis not present

## 2023-11-04 DIAGNOSIS — Z79899 Other long term (current) drug therapy: Secondary | ICD-10-CM | POA: Insufficient documentation

## 2023-11-04 DIAGNOSIS — I209 Angina pectoris, unspecified: Secondary | ICD-10-CM

## 2023-11-04 DIAGNOSIS — E088 Diabetes mellitus due to underlying condition with unspecified complications: Secondary | ICD-10-CM

## 2023-11-04 DIAGNOSIS — E782 Mixed hyperlipidemia: Secondary | ICD-10-CM | POA: Diagnosis not present

## 2023-11-04 DIAGNOSIS — I2510 Atherosclerotic heart disease of native coronary artery without angina pectoris: Secondary | ICD-10-CM

## 2023-11-04 DIAGNOSIS — Z951 Presence of aortocoronary bypass graft: Secondary | ICD-10-CM | POA: Diagnosis not present

## 2023-11-04 DIAGNOSIS — I25119 Atherosclerotic heart disease of native coronary artery with unspecified angina pectoris: Secondary | ICD-10-CM | POA: Diagnosis not present

## 2023-11-04 DIAGNOSIS — R9439 Abnormal result of other cardiovascular function study: Secondary | ICD-10-CM | POA: Diagnosis present

## 2023-11-04 DIAGNOSIS — I2582 Chronic total occlusion of coronary artery: Secondary | ICD-10-CM | POA: Diagnosis not present

## 2023-11-04 DIAGNOSIS — E119 Type 2 diabetes mellitus without complications: Secondary | ICD-10-CM | POA: Diagnosis not present

## 2023-11-04 DIAGNOSIS — E785 Hyperlipidemia, unspecified: Secondary | ICD-10-CM

## 2023-11-04 DIAGNOSIS — Z7984 Long term (current) use of oral hypoglycemic drugs: Secondary | ICD-10-CM | POA: Diagnosis not present

## 2023-11-04 HISTORY — PX: LEFT HEART CATH AND CORS/GRAFTS ANGIOGRAPHY: CATH118250

## 2023-11-04 LAB — GLUCOSE, CAPILLARY
Glucose-Capillary: 96 mg/dL (ref 70–99)
Glucose-Capillary: 94 mg/dL (ref 70–99)

## 2023-11-04 SURGERY — LEFT HEART CATH AND CORS/GRAFTS ANGIOGRAPHY
Anesthesia: LOCAL

## 2023-11-04 MED ORDER — VERAPAMIL HCL 2.5 MG/ML IV SOLN: INTRAVENOUS | Status: DC | PRN

## 2023-11-04 MED ORDER — FENTANYL CITRATE (PF) 100 MCG/2ML IJ SOLN
INTRAMUSCULAR | Status: AC
  Filled 2023-11-04: qty 2

## 2023-11-04 MED ORDER — MIDAZOLAM HCL 2 MG/2ML IJ SOLN
INTRAMUSCULAR | Status: AC
  Filled 2023-11-04: qty 2

## 2023-11-04 MED ORDER — SODIUM CHLORIDE 0.9 % WEIGHT BASED INFUSION: 1.0000 mL/kg/h | INTRAVENOUS | Status: DC

## 2023-11-04 MED ORDER — FENTANYL CITRATE (PF) 100 MCG/2ML IJ SOLN
INTRAMUSCULAR | Status: DC | PRN
  Administered 2023-11-04: 50 ug via INTRAVENOUS

## 2023-11-04 MED ORDER — VERAPAMIL HCL 2.5 MG/ML IV SOLN
INTRAVENOUS | Status: AC
  Filled 2023-11-04: qty 2

## 2023-11-04 MED ORDER — AMLODIPINE BESYLATE 2.5 MG PO TABS: 2.5000 mg | ORAL_TABLET | Freq: Every day | ORAL | 0 refills | Status: DC

## 2023-11-04 MED ORDER — ASPIRIN 81 MG PO CHEW
81.0000 mg | CHEWABLE_TABLET | ORAL | Status: AC
  Administered 2023-11-04: 81 mg via ORAL

## 2023-11-04 MED ORDER — HYDRALAZINE HCL 20 MG/ML IJ SOLN
15.0000 mg | INTRAMUSCULAR | Status: DC | PRN
  Administered 2023-11-04: 15 mg via INTRAVENOUS

## 2023-11-04 MED ORDER — HEPARIN SODIUM (PORCINE) 1000 UNIT/ML IJ SOLN
INTRAMUSCULAR | Status: DC | PRN
  Administered 2023-11-04: 3000 [IU] via INTRAVENOUS

## 2023-11-04 MED ORDER — IOHEXOL 350 MG/ML SOLN
INTRAVENOUS | Status: DC | PRN
  Administered 2023-11-04: 40 mL

## 2023-11-04 MED ORDER — HYDRALAZINE HCL 20 MG/ML IJ SOLN
INTRAMUSCULAR | Status: AC
  Filled 2023-11-04: qty 1

## 2023-11-04 MED ORDER — METOPROLOL SUCCINATE ER 25 MG PO TB24: 25.0000 mg | ORAL_TABLET | Freq: Every day | ORAL | 0 refills | Status: DC

## 2023-11-04 MED ORDER — LIDOCAINE HCL (PF) 1 % IJ SOLN
INTRAMUSCULAR | Status: DC | PRN
  Administered 2023-11-04: 2 mL
  Administered 2023-11-04: 8 mL

## 2023-11-04 MED ORDER — MIDAZOLAM HCL 2 MG/2ML IJ SOLN
INTRAMUSCULAR | Status: DC | PRN
  Administered 2023-11-04: 2 mg via INTRAVENOUS

## 2023-11-04 MED ORDER — ACETAMINOPHEN 325 MG PO TABS
650.0000 mg | ORAL_TABLET | ORAL | Status: DC | PRN
Start: 2023-11-04 — End: 2023-11-04

## 2023-11-04 MED ORDER — SODIUM CHLORIDE 0.9 % WEIGHT BASED INFUSION
3.0000 mL/kg/h | INTRAVENOUS | Status: AC
  Administered 2023-11-04: 3 mL/kg/h via INTRAVENOUS

## 2023-11-04 MED ORDER — ONDANSETRON HCL 4 MG/2ML IJ SOLN: 4.0000 mg | Freq: Four times a day (QID) | INTRAMUSCULAR | Status: DC | PRN

## 2023-11-04 MED ORDER — SODIUM CHLORIDE 0.9% FLUSH: 3.0000 mL | Freq: Two times a day (BID) | INTRAVENOUS | Status: DC

## 2023-11-04 MED ORDER — HEPARIN SODIUM (PORCINE) 1000 UNIT/ML IJ SOLN
INTRAMUSCULAR | Status: AC
  Filled 2023-11-04: qty 10

## 2023-11-04 MED ORDER — HEPARIN (PORCINE) IN NACL 1000-0.9 UT/500ML-% IV SOLN
INTRAVENOUS | Status: DC | PRN
  Administered 2023-11-04 (×2): 500 mL

## 2023-11-04 SURGICAL SUPPLY — 13 items
CATH INFINITI 5FR MULTPACK ANG (CATHETERS) IMPLANT
DEVICE RAD COMP TR BAND LRG (VASCULAR PRODUCTS) IMPLANT
GLIDESHEATH SLEND A-KIT 6F 22G (SHEATH) IMPLANT
GUIDEWIRE ANGLED .035X150CM (WIRE) IMPLANT
GUIDEWIRE INQWIRE 1.5J.035X260 (WIRE) IMPLANT
INQWIRE 1.5J .035X260CM (WIRE) ×1
KIT SYRINGE INJ CVI SPIKEX1 (MISCELLANEOUS) IMPLANT
PACK CARDIAC CATHETERIZATION (CUSTOM PROCEDURE TRAY) ×2 IMPLANT
SET ATX-X65L (MISCELLANEOUS) IMPLANT
SHEATH PINNACLE 5F 10CM (SHEATH) IMPLANT
SHEATH PROBE COVER 6X72 (BAG) IMPLANT
WIRE EMERALD 3MM-J .035X150CM (WIRE) IMPLANT
WIRE MICRO SET SILHO 5FR 7 (SHEATH) IMPLANT

## 2023-11-04 NOTE — Progress Notes (Signed)
Site area: rt groin Site Prior to Removal:  Level 0 Pressure Applied For: 25 minutes Manual:   yes Patient Status During Pull:  stable Post Pull Site:  Level 0 Post Pull Instructions Given:  yes Post Pull Pulses Present: rt pt palpable Dressing Applied:  gauze and tegaderm Bedrest begins @ 1225 Comments:

## 2023-11-04 NOTE — Telephone Encounter (Signed)
Left message for patient to call back  

## 2023-11-04 NOTE — Discharge Instructions (Addendum)
Drink plenty of fluids for 48 hours and keep wrist elevated at heart level for 24 hours  Radial Site Care   This sheet gives you information about how to care for yourself after your procedure. Your health care provider may also give you more specific instructions. If you have problems or questions, contact your health care provider. What can I expect after the procedure? After the procedure, it is common to have: Bruising and tenderness at the catheter insertion area. Follow these instructions at home: Medicines Take over-the-counter and prescription medicines only as told by your health care provider. Insertion site care Follow instructions from your health care provider about how to take care of your insertion site. Make sure you: Remove your dressing at 4 pm tomorrow Check your insertion site every day for signs of infection. Check for: Redness, swelling, or pain. Fluid or blood. Pus or a bad smell. Warmth. Do not take baths, swim, or use a hot tub until your health care provider approves. You may shower after removal of dressing Pat the area dry with a clean towel. Do not rub the site. That could cause bleeding. Do not apply powder or lotion to the site. Activity   For 24 hours after the procedure, or as directed by your health care provider: Do not flex or bend the affected arm. Do not push or pull heavy objects with the affected arm. Do not drive yourself home from the hospital or clinic. You may drive 24 hours after the procedure unless your health care provider tells you not to. Do not operate machinery or power tools. Do not lift anything that is heavier than 10 lb (4.5 kg), or the limit that you are told, until your health care provider says that it is safe.  For 4 days Ask your health care provider when it is okay to: Return to work or school. Resume usual physical activities or sports. Resume sexual activity. General instructions If the catheter site starts to  bleed, raise your arm and put firm pressure on the site for 15 minutes. If the bleeding does not stop, get help right away. This is a medical emergency. If you went home on the same day as your procedure, a responsible adult should be with you for the first 24 hours after you arrive home. Keep all follow-up visits as told by your health care provider. This is important. Contact a health care provider if: You have a fever. You have redness, swelling, or yellow drainage around your insertion site. Get help right away if: You have unusual pain at the radial site. The catheter insertion area swells very fast. The insertion area is bleeding, and the bleeding does not stop when you hold steady pressure on the area for 15 minutes Your arm or hand becomes pale, cool, tingly, or numb. These symptoms may represent a serious problem that is an emergency. Do not wait to see if the symptoms will go away. Get medical help right away. Call your local emergency services (911 in the [place] Do not drive yourself to the hospital. Summary After the procedure, it is common to have bruising and tenderness at the site. Follow instructions from your health care provider about how to take care of your radial site wound. Check the wound every day for signs of infection. Do not lift anything that is heavier than 10 lb (4.5 kg), or the limit that you are told, until your health care provider says that it is safe. This information is not  intended to replace advice given to you by your health care provider. Make sure you discuss any questions you have with your health care provider. Document Revised: 12/21/2017 Document Reviewed: 12/21/2017 Elsevier Patient Education  2020 [employer] Inc[employer]    Femoral Site Care This sheet gives you information about how to care for yourself after your procedure. Your health care provider may also give you more specific instructions. If you have problems or questions, contact your health care  provider. What can I expect after the procedure?  After the procedure, it is common to have: Bruising that usually fades within 1-2 weeks. Tenderness at the site. Follow these instructions at home: Wound care Follow instructions from your health care provider about how to take care of your insertion site. Make sure you: Remove your dressing at 4:00 pm tomorrow Do not take baths, swim, or use a hot tub until your health care provider approves. You may shower after removal of dressing Pat the area dry with a clean towel. Do not rub the site. This may cause bleeding. Do not apply powder or lotion to the site. Keep the site clean and dry. Check your femoral site every day for signs of infection. Check for: Redness, swelling, or pain. Fluid or blood. Warmth. Pus or a bad smell. Activity For the first 2-3 days after your procedure, or as long as directed: Avoid climbing stairs as much as possible. Do not squat. Do not lift anything that is heavier than 10 lb (4.5 kg), or the limit that you are told, until your health care provider says that it is safe. For 5 days Rest as directed. Avoid sitting for a long time without moving. Get up to take short walks every 1-2 hours. Do not drive for 24 hours if you were given a medicine to help you relax (sedative). General instructions Take over-the-counter and prescription medicines only as told by your health care provider. Keep all follow-up visits as told by your health care provider. This is important. Contact a health care provider if you have: A fever or chills. You have redness, swelling, or pain around your insertion site. Get help right away if: The catheter insertion area swells very fast. You pass out. You suddenly start to sweat or your skin gets clammy. The catheter insertion area is bleeding, and the bleeding does not stop when you hold steady pressure on the area. The area near or just beyond the catheter insertion site becomes  pale, cool, tingly, or numb. These symptoms may represent a serious problem that is an emergency. Do not wait to see if the symptoms will go away. Get medical help right away. Call your local emergency services (911 in the [place] Do not drive yourself to the hospital. Summary After the procedure, it is common to have bruising that usually fades within 1-2 weeks. Check your femoral site every day for signs of infection. Do not lift anything that is heavier than 10 lb (4.5 kg), or the limit that you are told, until your health care provider says that it is safe. This information is not intended to replace advice given to you by your health care provider. Make sure you discuss any questions you have with your health care provider. Document Revised: 11/28/2017 Document Reviewed: 11/28/2017 Elsevier Patient Education  2020 [employer].

## 2023-11-04 NOTE — Interval H&P Note (Signed)
History and Physical Interval Note:  11/04/2023 10:23 AM  Vincent Stevens  has presented today for surgery, with the diagnosis of angina - abnormal stress test.  The various methods of treatment have been discussed with the patient and family. After consideration of risks, benefits and other options for treatment, the patient has consented to  Procedure(s): LEFT HEART CATH AND CORS/GRAFTS ANGIOGRAPHY (N/A) and possible coronary angioplasty as a surgical intervention.  The patient's history has been reviewed, patient examined, no change in status, stable for surgery.  I have reviewed the patient's chart and labs.  Questions were answered to the patient's satisfaction.    Cath Lab Visit (complete for each Cath Lab visit)  Clinical Evaluation Leading to the Procedure:   ACS: No.  Non-ACS:    Anginal Classification: CCS III  Anti-ischemic medical therapy: No Therapy  Non-Invasive Test Results: Intermediate-risk stress test findings: cardiac mortality 1-3%/year  Prior CABG: Previous CABG   Yates Decamp

## 2023-11-04 NOTE — Telephone Encounter (Signed)
-----   Message from Aundra Dubin Revankar sent at 11/03/2023  8:10 AM EST ----- The results of the study is unremarkable. Please inform patient. I will discuss in detail at next appointment. Cc  primary care/referring physician Garwin Brothers, MD 11/03/2023 8:10 AM

## 2023-11-07 ENCOUNTER — Encounter (HOSPITAL_COMMUNITY): Payer: Self-pay | Admitting: Cardiology

## 2023-11-07 LAB — POCT ACTIVATED CLOTTING TIME: Activated Clotting Time: 158 s

## 2023-11-09 ENCOUNTER — Ambulatory Visit (HOSPITAL_BASED_OUTPATIENT_CLINIC_OR_DEPARTMENT_OTHER): Payer: Medicare HMO

## 2023-11-26 ENCOUNTER — Other Ambulatory Visit: Payer: Self-pay | Admitting: Cardiology

## 2024-02-17 ENCOUNTER — Other Ambulatory Visit: Payer: Self-pay | Admitting: Family Medicine

## 2024-02-17 MED ORDER — SIMVASTATIN 80 MG PO TABS: 80.0000 mg | ORAL_TABLET | Freq: Every day | ORAL | 1 refills | Status: DC

## 2024-02-17 NOTE — Telephone Encounter (Signed)
 The Dr that prescribed it before moved out of state. Pt needs Dr Jimmey Ralph to take over, his last physical was last October and has a physical appt on 09/13/24. Needs ASAP    Prescription Request  02/17/2024  LOV: 09/12/2023  What is the name of the medication or equipment?  simvastatin (ZOCOR) 80 MG tablet    Have you contacted your pharmacy to request a refill? Yes   Which pharmacy would you like this sent to? [id number] [place], [place] - [address] [place] [address] Phone: [phone] Fax: [phone]   Patient notified that their request is being sent to the clinical staff for review and that they should receive a response within 2 business days.   Please advise at Mobile [phone] (mobile)

## 2024-02-20 NOTE — Telephone Encounter (Signed)
 This was sent to wrong pharmacy. Please resend to the following ASAP:  [id number] [place], [place] - [address] Phone: [phone]  Fax: [phone]

## 2024-02-21 ENCOUNTER — Other Ambulatory Visit: Payer: Self-pay | Admitting: *Deleted

## 2024-02-21 MED ORDER — SIMVASTATIN 80 MG PO TABS: 80.0000 mg | ORAL_TABLET | Freq: Every day | ORAL | 1 refills | Status: DC

## 2024-03-01 ENCOUNTER — Ambulatory Visit: Payer: Self-pay | Attending: Cardiology | Admitting: Cardiology

## 2024-03-01 ENCOUNTER — Encounter: Payer: Self-pay | Admitting: Cardiology

## 2024-03-01 VITALS — BP 122/64 | HR 62 | Ht 70.0 in | Wt 199.0 lb

## 2024-03-01 DIAGNOSIS — I25119 Atherosclerotic heart disease of native coronary artery with unspecified angina pectoris: Secondary | ICD-10-CM

## 2024-03-01 DIAGNOSIS — E785 Hyperlipidemia, unspecified: Secondary | ICD-10-CM | POA: Diagnosis not present

## 2024-03-01 DIAGNOSIS — I10 Essential (primary) hypertension: Secondary | ICD-10-CM | POA: Diagnosis not present

## 2024-03-01 DIAGNOSIS — Z951 Presence of aortocoronary bypass graft: Secondary | ICD-10-CM

## 2024-03-01 NOTE — Patient Instructions (Signed)
 Medication Instructions:  Your physician recommends that you continue on your current medications as directed. Please refer to the Current Medication list given to you today.  *If you need a refill on your cardiac medications before your next appointment, please call your pharmacy*  Lab Work: Your physician recommends that you return for lab work in:   Labs today: BMP, LFT, Lipid, A1c  If you have labs (blood work) drawn today and your tests are completely normal, you will receive your results only by: MyChart Message (if you have MyChart) OR A paper copy in the mail If you have any lab test that is abnormal or we need to change your treatment, we will call you to review the results.  Testing/Procedures: None  Follow-Up: At [facility], you and your health needs are our priority.  As part of our continuing mission to provide you with exceptional heart care, our providers are all part of one team.  This team includes your primary Cardiologist (physician) and Advanced Practice Providers or APPs (Physician Assistants and Nurse Practitioners) who all work together to provide you with the care you need, when you need it.  Your next appointment:   9 month(s)  Provider:   Belva Crome, MD    We recommend signing up for the patient portal called "MyChart".  Sign up information is provided on this After Visit Summary.  MyChart is used to connect with patients for Virtual Visits (Telemedicine).  Patients are able to view lab/test results, encounter notes, upcoming appointments, etc.  Non-urgent messages can be sent to your provider as well.   To learn more about what you can do with MyChart, go to [email or web address].   Other Instructions None

## 2024-03-01 NOTE — Progress Notes (Signed)
 Cardiology Office Note:    Date:  03/01/2024   [name], DOB May 12, 1946, MRN [id number]  PCP:  Vincent Dark, MD  Cardiologist:  Vincent Brothers, MD   Referring MD: Vincent Dark, MD    ASSESSMENT:    1. Coronary artery disease involving native coronary artery of native heart with angina pectoris (HCC)   2. Essential hypertension   3. Hx of CABG   4. Dyslipidemia    PLAN:    In order of problems listed above:  Coronary artery disease: Secondary prevention stressed to the patient.  Importance of compliance with diet medication stressed and he vocalized understanding.  He was advised to ambulate regularly to the best of his ability. Essential hypertension: Blood pressure is stable and diet was emphasized. Mixed dyslipidemia: Lipids are at goal.  He will get them checked today. Diabetes mellitus: Under control.  He wants me to add hemoglobin A1c to his regimen.   Patient will be seen in follow-up appointment in 6 months or earlier if the patient has any concerns.   Medication Adjustments/Labs and Tests Ordered: Current medicines are reviewed at length with the patient today.  Concerns regarding medicines are outlined above.  No orders of the defined types were placed in this encounter.  No orders of the defined types were placed in this encounter.    No chief complaint on file.    History of Present Illness:    Vincent Stevens is a 78 y.o. male.  Patient has medical history of coronary artery disease, essential hypertension, mixed dyslipidemia and diabetes mellitus.  He denies any problems at this time and takes care of activities of daily living.  No chest pain orthopnea or PND.  At the time of my evaluation, the patient is alert awake oriented and in no distress.  He is walking on a regular basis   Past Medical History:  Diagnosis Date   Allergy    Angina pectoris (HCC) 11/02/2023   Angina, class III (HCC) 03/01/2018   BPH (benign prostatic  hyperplasia) 09/19/2009   Qualifier: Diagnosis of  By: Amador Cunas  MD, Janett Labella    Chest pain of uncertain etiology 10/20/2023   Coronary artery disease involving native coronary artery of native heart with angina pectoris (HCC)    Diabetes mellitus due to underlying condition with unspecified complications (HCC) 10/20/2023   Dyslipidemia 08/14/2010   Qualifier: Diagnosis of  By: Amador Cunas  MD, Janett Labella    Essential hypertension 01/17/2009   Qualifier: Diagnosis of  By: Amador Cunas  MD, Janett Labella    GERD (gastroesophageal reflux disease) 09/09/2022   Gout    History of colonic polyps 09/27/2008   Qualifier: Diagnosis of  By: Amador Cunas  MD, Janett Labella  Initial colonoscopy 2003.  Hyperplastic polyps only Followup colonoscopy 2008.  Normal  10 year interval recommended    Hx of CABG 03/01/2018   Left main coronary artery disease    Melanoma (HCC)    Pain in both lower extremities 08/15/2018   Prediabetes 09/13/2022    Past Surgical History:  Procedure Laterality Date   CORONARY ARTERY BYPASS GRAFT N/A 03/01/2018   Procedure: CORONARY ARTERY BYPASS GRAFTING (CABG) times three using the right saphaneous vein. Harvested endoscopicly; and left internal mammary artery.;  Surgeon: Alleen Borne, MD;  Location: [facility] OR;  Service: Open Heart Surgery;  Laterality: N/A;   HERNIA REPAIR     LEFT HEART CATH AND CORONARY ANGIOGRAPHY N/A 03/01/2018   Procedure: LEFT  HEART CATH AND CORONARY ANGIOGRAPHY;  Surgeon: Marykay Lex, MD;  Location: [facility] INVASIVE CV LAB;  Service: Cardiovascular;  Laterality: N/A;   LEFT HEART CATH AND CORS/GRAFTS ANGIOGRAPHY N/A 11/04/2023   Procedure: LEFT HEART CATH AND CORS/GRAFTS ANGIOGRAPHY;  Surgeon: Yates Decamp, MD;  Location: [facility] INVASIVE CV LAB;  Service: Cardiovascular;  Laterality: N/A;   melanoma removal     TEE WITHOUT CARDIOVERSION N/A 03/01/2018   Procedure: TRANSESOPHAGEAL ECHOCARDIOGRAM (TEE);  Surgeon: Alleen Borne, MD;  Location: [facility] OR;  Service: Open Heart  Surgery;  Laterality: N/A;    Current Medications: Current Meds  Medication Sig   amLODipine (NORVASC) 2.5 MG tablet TAKE 1 TABLET BY MOUTH EVERY DAY   aspirin EC 81 MG tablet Take 1 tablet (81 mg total) by mouth daily.   ibuprofen (ADVIL,MOTRIN) 200 MG tablet Take 200 mg by mouth 2 (two) times daily as needed (arthritis apin).    losartan (COZAAR) 50 MG tablet Take 1 tablet (50 mg total) by mouth 2 (two) times daily. Patient needs appointment for further refills. 1 st attempt   metFORMIN (GLUCOPHAGE) 500 MG tablet Take 500 mg by mouth daily with breakfast.   metoprolol succinate (TOPROL-XL) 25 MG 24 hr tablet TAKE 1 TABLET BY MOUTH DAILY. TAKE WITH OR IMMEDIATELY FOLLOWING A MEAL.   Multiple Vitamin (MULTIVITAMIN WITH MINERALS) TABS tablet Take 1 tablet by mouth at bedtime.   nitroGLYCERIN (NITROSTAT) 0.4 MG SL tablet Place 0.4 mg under the tongue every 5 (five) minutes as needed for chest pain.   pantoprazole (PROTONIX) 40 MG tablet Take 1 tablet (40 mg total) by mouth 2 (two) times daily.   simvastatin (ZOCOR) 80 MG tablet Take 1 tablet (80 mg total) by mouth daily. Patient needs an appointment for further refills. 3 rd/final attempt   tamsulosin (FLOMAX) 0.4 MG CAPS capsule TAKE 1 CAPSULE EVERY DAY   triamcinolone ointment (KENALOG) 0.5 % APPLY 1 APPLICATION TOPICALLY 2 (TWO) TIMES DAILY.     Allergies:   Sulfamethoxazole   Social History   Socioeconomic History   Marital status: Married    Spouse name: Not on file   Number of children: 1   Years of education: Not on file   Highest education level: Not on file  Occupational History   Occupation: Courier     Comment: Part time   Tobacco Use   Smoking status: Former    Current packs/day: 0.00    Types: Cigarettes    Quit date: 11/29/1980    Years since quitting: 43.2   Smokeless tobacco: Never  Vaping Use   Vaping status: Never Used  Substance and Sexual Activity   Alcohol use: No   Drug use: No   Sexual activity: Not on  file  Other Topics Concern   Not on file  Social History Narrative   Not on file   Social Drivers of Health   Financial Resource Strain: Stevens Risk  (02/15/2023)   Overall Financial Resource Strain (CARDIA)    Difficulty of Paying Living Expenses: Not hard at all  Food Insecurity: No Food Insecurity (02/15/2023)   Hunger Vital Sign    Worried About Running Out of Food in the Last Year: Never true    Ran Out of Food in the Last Year: Never true  Transportation Needs: No Transportation Needs (02/15/2023)   PRAPARE - Administrator, Civil Service (Medical): No    Lack of Transportation (Non-Medical): No  Physical Activity: Inactive (02/15/2023)   Exercise Vital  Sign    Days of Exercise per Week: 0 days    Minutes of Exercise per Session: 0 min  Stress: No Stress Concern Present (02/15/2023)   [employer] of Occupational Health - Occupational Stress Questionnaire    Feeling of Stress : Not at all  Social Connections: Moderately Isolated (02/15/2023)   Social Connection and Isolation Panel [NHANES]    Frequency of Communication with Friends and Family: More than three times a week    Frequency of Social Gatherings with Friends and Family: More than three times a week    Attends Religious Services: Never    Database administrator or Organizations: No    Attends Engineer, structural: Never    Marital Status: Married     Family History: The patient's family history includes Cancer in his sister; Dementia in his mother; Diabetes in an other family member; Heart attack in his father; Hypertension in an other family member.  ROS:   Please see the history of present illness.    All other systems reviewed and are negative.  EKGs/Labs/Other Studies Reviewed:    The following studies were reviewed today: I discussed findings of the coronary angiography with the patient at length   Recent Labs: 09/12/2023: ALT 13; TSH 3.96 11/02/2023: BUN 16; Creatinine, Ser 1.05;  Hemoglobin 15.9; Platelets 193; Potassium 5.1; Sodium 142  Recent Lipid Panel    Component Value Date/Time   CHOL 137 09/12/2023 1021   CHOL 138 06/24/2022 0846   TRIG 64.0 09/12/2023 1021   HDL 53.70 09/12/2023 1021   HDL 52 06/24/2022 0846   CHOLHDL 3 09/12/2023 1021   VLDL 12.8 09/12/2023 1021   LDLCALC 71 09/12/2023 1021   LDLCALC 69 06/24/2022 0846    Physical Exam:    VS:  BP 122/64   Pulse 62   Ht 5\' 10"  (1.778 m)   Wt 199 lb (90.3 kg)   SpO2 98%   BMI 28.55 kg/m     Wt Readings from Last 3 Encounters:  03/01/24 199 lb (90.3 kg)  11/04/23 195 lb (88.5 kg)  11/02/23 195 lb 1.3 oz (88.5 kg)     GEN: Patient is in no acute distress HEENT: Normal NECK: No JVD; No carotid bruits LYMPHATICS: No lymphadenopathy CARDIAC: Hear sounds regular, 2/6 systolic murmur at the apex. RESPIRATORY:  Clear to auscultation without rales, wheezing or rhonchi  ABDOMEN: Soft, non-tender, non-distended MUSCULOSKELETAL:  No edema; No deformity  SKIN: Warm and dry NEUROLOGIC:  Alert and oriented x 3 PSYCHIATRIC:  Normal affect   Signed, Vincent Brothers, MD  03/01/2024 8:07 AM    [facility]

## 2024-03-02 ENCOUNTER — Telehealth: Payer: Self-pay

## 2024-03-02 DIAGNOSIS — E875 Hyperkalemia: Secondary | ICD-10-CM

## 2024-03-02 LAB — LIPID PANEL
Chol/HDL Ratio: 2.7 ratio (ref 0.0–5.0)
Cholesterol, Total: 141 mg/dL (ref 100–199)
HDL: 53 mg/dL (ref >39)
LDL Chol Calc (NIH): 74 mg/dL (ref 0–99)
Triglycerides: 68 mg/dL (ref 0–149)
VLDL Cholesterol Cal: 14 mg/dL (ref 5–40)

## 2024-03-02 LAB — HEPATIC FUNCTION PANEL
ALT: 16 IU/L (ref 0–44)
AST: 24 IU/L (ref 0–40)
Albumin: 4.6 g/dL (ref 3.8–4.8)
Alkaline Phosphatase: 67 IU/L (ref 44–121)
Bilirubin Total: 0.5 mg/dL (ref 0.0–1.2)
Bilirubin, Direct: 0.19 mg/dL (ref 0.00–0.40)
Total Protein: 6.5 g/dL (ref 6.0–8.5)

## 2024-03-02 LAB — BASIC METABOLIC PANEL WITH GFR
BUN/Creatinine Ratio: 15 (ref 10–24)
BUN: 17 mg/dL (ref 8–27)
CO2: 24 mmol/L (ref 20–29)
Calcium: 9.3 mg/dL (ref 8.6–10.2)
Chloride: 104 mmol/L (ref 96–106)
Creatinine, Ser: 1.17 mg/dL (ref 0.76–1.27)
Glucose: 102 mg/dL — ABNORMAL HIGH (ref 70–99)
Potassium: 5.3 mmol/L — ABNORMAL HIGH (ref 3.5–5.2)
Sodium: 141 mmol/L (ref 134–144)
eGFR: 63 mL/min/{1.73_m2} (ref >59)

## 2024-03-02 LAB — HEMOGLOBIN A1C
Est. average glucose Bld gHb Est-mCnc: 123 mg/dL
Hgb A1c MFr Bld: 5.9 % — ABNORMAL HIGH (ref 4.8–5.6)

## 2024-03-02 MED ORDER — SODIUM POLYSTYRENE SULFONATE PO POWD: Freq: Once | ORAL | 0 refills | Status: AC

## 2024-03-02 NOTE — Telephone Encounter (Signed)
-----   Message from Rathbun R Revankar sent at 03/02/2024  7:09 AM EDT ----- Kayexalate 30 g and recheck Chem-7 in 1 week.  Keep diet low in potassium.  Copy primary Garwin Brothers, MD 03/02/2024 7:09 AM

## 2024-03-02 NOTE — Telephone Encounter (Signed)
Results reviewed with pt as per Dr. Revankar's note.  Pt verbalized understanding and had no additional questions. Routed to PCP.  

## 2024-03-09 DIAGNOSIS — E875 Hyperkalemia: Secondary | ICD-10-CM | POA: Diagnosis not present

## 2024-03-10 LAB — BASIC METABOLIC PANEL WITH GFR
BUN/Creatinine Ratio: 16 (ref 10–24)
BUN: 17 mg/dL (ref 8–27)
CO2: 22 mmol/L (ref 20–29)
Calcium: 9.4 mg/dL (ref 8.6–10.2)
Chloride: 105 mmol/L (ref 96–106)
Creatinine, Ser: 1.06 mg/dL (ref 0.76–1.27)
Glucose: 103 mg/dL — ABNORMAL HIGH (ref 70–99)
Potassium: 5.2 mmol/L (ref 3.5–5.2)
Sodium: 142 mmol/L (ref 134–144)
eGFR: 71 mL/min/{1.73_m2} (ref >59)

## 2024-04-10 ENCOUNTER — Ambulatory Visit (INDEPENDENT_AMBULATORY_CARE_PROVIDER_SITE_OTHER): Payer: Self-pay

## 2024-04-10 VITALS — Ht 70.0 in | Wt 199.0 lb

## 2024-04-10 DIAGNOSIS — Z0000 Encounter for general adult medical examination without abnormal findings: Secondary | ICD-10-CM

## 2024-04-10 DIAGNOSIS — E088 Diabetes mellitus due to underlying condition with unspecified complications: Secondary | ICD-10-CM

## 2024-04-10 NOTE — Progress Notes (Signed)
 Subjective:   Vincent Stevens is a 78 y.o. who presents for a Medicare Wellness preventive visit.  As a reminder, Annual Wellness Visits don't include a physical exam, and some assessments may be limited, especially if this visit is performed virtually. We may recommend an in-person visit if needed.  Visit Complete: Virtual I connected with  Vincent Stevens on 04/10/24 by a audio enabled telemedicine application and verified that I am speaking with the correct person using two identifiers.  Patient Location: Home  Provider Location: Office/Clinic  I discussed the limitations of evaluation and management by telemedicine. The patient expressed understanding and agreed to proceed.  Vital Signs: Because this visit was a virtual/telehealth visit, some criteria may be missing or patient reported. Any vitals not documented were not able to be obtained and vitals that have been documented are patient reported.  VideoDeclined- This patient declined Librarian, academic. Therefore the visit was completed with audio only.  Persons Participating in Visit: Patient.  AWV Questionnaire: No: Patient Medicare AWV questionnaire was not completed prior to this visit.  Cardiac Risk Factors include: advanced age (>12men, >56 women);dyslipidemia;hypertension;male gender     Objective:     Today's Vitals   04/10/24 0853  Weight: 199 lb (90.3 kg)  Height: 5\' 10"  (1.778 m)   Body mass index is 28.55 kg/m.     04/10/2024    8:58 AM 11/04/2023    8:16 AM 02/15/2023    8:11 AM 08/15/2021   10:43 AM 10/20/2020    2:44 PM 09/14/2019    8:41 AM 03/01/2018    6:25 PM  Advanced Directives  Does Patient Have a Medical Advance Directive? Yes No Yes No No No No  Type of Estate agent of [place];Living will  Healthcare Power of [place];Living will      Does patient want to make changes to medical advance directive?    No - Patient declined     Copy of  Healthcare Power of Attorney in Chart? No - copy requested  No - copy requested      Would patient like information on creating a medical advance directive?  No - Patient declined  No - Patient declined Yes (ED - Information included in AVS) Yes (MAU/Ambulatory/Procedural Areas - Information given) No - Patient declined    Current Medications (verified) Outpatient Encounter Medications as of 04/10/2024  Medication Sig   amLODipine  (NORVASC ) 2.5 MG tablet TAKE 1 TABLET BY MOUTH EVERY DAY   aspirin  EC 81 MG tablet Take 1 tablet (81 mg total) by mouth daily.   ibuprofen  (ADVIL ,MOTRIN ) 200 MG tablet Take 200 mg by mouth 2 (two) times daily as needed (arthritis apin).    losartan  (COZAAR ) 50 MG tablet Take 1 tablet (50 mg total) by mouth 2 (two) times daily. Patient needs appointment for further refills. 1 st attempt   metFORMIN  (GLUCOPHAGE ) 500 MG tablet Take 500 mg by mouth daily with breakfast.   metoprolol  succinate (TOPROL -XL) 25 MG 24 hr tablet TAKE 1 TABLET BY MOUTH DAILY. TAKE WITH OR IMMEDIATELY FOLLOWING A MEAL.   Multiple Vitamin (MULTIVITAMIN WITH MINERALS) TABS tablet Take 1 tablet by mouth at bedtime.   nitroGLYCERIN  (NITROSTAT ) 0.4 MG SL tablet Place 0.4 mg under the tongue every 5 (five) minutes as needed for chest pain.   pantoprazole  (PROTONIX ) 40 MG tablet Take 1 tablet (40 mg total) by mouth 2 (two) times daily.   simvastatin  (ZOCOR ) 80 MG tablet Take 1 tablet (80 mg  total) by mouth daily. Patient needs an appointment for further refills. 3 rd/final attempt   tamsulosin  (FLOMAX ) 0.4 MG CAPS capsule TAKE 1 CAPSULE EVERY DAY   triamcinolone  ointment (KENALOG ) 0.5 % APPLY 1 APPLICATION TOPICALLY 2 (TWO) TIMES DAILY. (Patient not taking: Reported on 04/10/2024)   No facility-administered encounter medications on file as of 04/10/2024.    Allergies (verified) Sulfamethoxazole   History: Past Medical History:  Diagnosis Date   Allergy    Angina pectoris (HCC) 11/02/2023   Angina,  class III (HCC) 03/01/2018   BPH (benign prostatic hyperplasia) 09/19/2009   Qualifier: Diagnosis of  By: Minnette Amato  MD, Ronie Cohen    Chest pain of uncertain etiology 10/20/2023   Coronary artery disease involving native coronary artery of native heart with angina pectoris (HCC)    Diabetes mellitus due to underlying condition with unspecified complications (HCC) 10/20/2023   Dyslipidemia 08/14/2010   Qualifier: Diagnosis of  By: Minnette Amato  MD, Ronie Cohen    Essential hypertension 01/17/2009   Qualifier: Diagnosis of  By: Minnette Amato  MD, Ronie Cohen    GERD (gastroesophageal reflux disease) 09/09/2022   Gout    History of colonic polyps 09/27/2008   Qualifier: Diagnosis of  By: Minnette Amato  MD, Ronie Cohen  Initial colonoscopy 2003.  Hyperplastic polyps only Followup colonoscopy 2008.  Normal  10 year interval recommended    Hx of CABG 03/01/2018   Left main coronary artery disease    Melanoma (HCC)    Pain in both lower extremities 08/15/2018   Prediabetes 09/13/2022   Past Surgical History:  Procedure Laterality Date   CORONARY ARTERY BYPASS GRAFT N/A 03/01/2018   Procedure: CORONARY ARTERY BYPASS GRAFTING (CABG) times three using the right saphaneous vein. Harvested endoscopicly; and left internal mammary artery.;  Surgeon: Bartley Lightning, MD;  Location: [facility] OR;  Service: Open Heart Surgery;  Laterality: N/A;   HERNIA REPAIR     LEFT HEART CATH AND CORONARY ANGIOGRAPHY N/A 03/01/2018   Procedure: LEFT HEART CATH AND CORONARY ANGIOGRAPHY;  Surgeon: Arleen Lacer, MD;  [facility] INVAS[facility] CV LAB;  Service: Cardiovascular;  Laterality: N/A;   LEFT HEART CATH AND CORS/GRAFTS ANGIOGRAPHY N/A 11/04/2023   Procedure: LEFT HEART CATH AND CORS/GRAFTS ANGIOGRAPHY;  Surgeon: Knox Perl, MD;  Location: [facility] INVASIVE CV LAB;  Service: Cardiovascular;  Laterality: N/A;   melanoma removal     TEE WITHOUT CARDIOVERSION N/A 03/01/2018   Procedure: TRANSESOPHAGEAL ECHOCARDIOGRAM (TEE);  Surgeon: Bartley Lightning,  MD;  Location: [facility];  Service: Open Heart Surgery;  Laterality: N/A;   Family History  Problem Relation Age of Onset   Dementia Mother    Heart attack Father    Cancer Sister        Skin   Diabetes Other    Hypertension Other    Social History   Socioeconomic History   Marital status: Married    Spouse name: Not on file   Number of children: 1   Years of education: Not on file   Highest education level: Not on file  Occupational History   Occupation: Courier     Comment: Part time   Tobacco Use   Smoking status: Former    Current packs/day: 0.00    Types: Cigarettes    Quit date: 11/29/1980    Years since quitting: 43.3   Smokeless tobacco: Never  Vaping Use   Vaping status: Never Used  Substance and Sexual Activity   Alcohol use: No   Drug use:  No   Sexual activity: Not on file  Other Topics Concern   Not on file  Social History Narrative   Not on file   Social Drivers of Health   Financial Resource Strain: Low Risk  (04/10/2024)   Overall Financial Resource Strain (CARDIA)    Difficulty of Paying Living Expenses: Not hard at all  Food Insecurity: No Food Insecurity (04/10/2024)   Hunger Vital Sign    Worried About Running Out of Food in the Last Year: Never true    Ran Out of Food in the Last Year: Never true  Transportation Needs: No Transportation Needs (04/10/2024)   PRAPARE - Administrator, Civil Service (Medical): No    Lack of Transportation (Non-Medical): No  Physical Activity: Inactive (04/10/2024)   Exercise Vital Sign    Days of Exercise per Week: 0 days    Minutes of Exercise per Session: 0 min  Stress: No Stress Concern Present (04/10/2024)   [employer] of Occupational Health - Occupational Stress Questionnaire    Feeling of Stress : Not at all  Social Connections: Moderately Isolated (04/10/2024)   Social Connection and Isolation Panel [NHANES]    Frequency of Communication with Friends and Family: More than three times a week     Frequency of Social Gatherings with Friends and Family: Three times a week    Attends Religious Services: Never    Active Member of Clubs or Organizations: No    Attends Banker Meetings: Never    Marital Status: Married    Tobacco Counseling Counseling given: Not Answered    Clinical Intake:  Pre-visit preparation completed: Yes  Pain : No/denies pain     BMI - recorded: 28.55 Nutritional Status: BMI 25 -29 Overweight Diabetes: Yes CBG done?: No CBG resulted in Enter/ Edit results?: No Did pt. bring in CBG monitor from home?: No  Lab Results  Component Value Date   HGBA1C 5.9 (H) 03/01/2024   HGBA1C 6.1 09/12/2023   HGBA1C 5.6 03/14/2023     How often do you need to have someone help you when you read instructions, pamphlets, or other written materials from your doctor or pharmacy?: 1 - Never  Interpreter Needed?: No  Information entered by :: Lamont Pilsner, LPN   Activities of Daily Living     04/10/2024    8:54 AM 11/04/2023    8:15 AM  In your present state of health, do you have any difficulty performing the following activities:  Hearing? 1 0  Comment HOH   Vision? 0 0  Difficulty concentrating or making decisions? 0 0  Walking or climbing stairs? 1   Comment stiffness at times   Dressing or bathing? 0   Doing errands, shopping? 0   Preparing Food and eating ? N   Using the Toilet? N   In the past six months, have you accidently leaked urine? N   Do you have problems with loss of bowel control? N   Managing your Medications? N   Managing your Finances? N   Housekeeping or managing your Housekeeping? N     Patient Care Team: Rodney Clamp, MD as PCP - General (Family Medicine)  Indicate any recent Medical Services you may have received from other than [facility] providers in the past year (date may be approximate).     Assessment:    This is a routine wellness examination for Vincent Stevens.  Hearing/Vision screen Hearing Screening  - Comments:: Pt stated HOH  Vision Screening -  Comments:: Wears rx glasses - up to date with routine eye exams with [facility]     Goals Addressed             This Visit's Progress    Patient Stated       .ahs       Depression Screen     04/10/2024    8:59 AM 09/12/2023    9:27 AM 03/14/2023    9:11 AM 02/15/2023    8:10 AM 09/09/2022   12:56 PM 08/15/2021   10:45 AM 08/15/2021   10:31 AM  PHQ 2/9 Scores  PHQ - 2 Score 0 0 0 0 0 0 0    Fall Risk     04/10/2024    9:00 AM 09/12/2023    9:27 AM 03/14/2023    9:11 AM 02/15/2023    8:12 AM 09/09/2022   12:57 PM  Fall Risk   Falls in the past year? 0 0 0 0 0  Number falls in past yr: 0 0 0 0 0  Injury with Fall? 0 0 0 0 0  Risk for fall due to : Impaired mobility No Fall Risks No Fall Risks Impaired vision No Fall Risks  Follow up Falls prevention discussed   Falls prevention discussed     MEDICARE RISK AT HOME:  Medicare Risk at Home Any stairs in or around the home?: Yes If so, are there any without handrails?: No Home free of loose throw rugs in walkways, pet beds, electrical cords, etc?: Yes Adequate lighting in your home to reduce risk of falls?: Yes Life alert?: No Use of a cane, walker or w/c?: No Grab bars in the bathroom?: No Shower chair or bench in shower?: No Elevated toilet seat or a handicapped toilet?: No  TIMED UP AND GO:  Was the test performed?  No  Cognitive Function: 6CIT completed        04/10/2024    9:01 AM 02/15/2023    8:13 AM 08/15/2021   10:38 AM 09/14/2019    8:42 AM  6CIT Screen  What Year? 0 points 0 points 0 points 0 points  What month? 0 points 0 points 0 points 0 points  What time? 0 points 0 points 0 points 0 points  Count back from 20 0 points 0 points 0 points 0 points  Months in reverse 0 points 4 points 0 points 0 points  Repeat phrase 0 points 0 points 0 points 0 points  Total Score 0 points 4 points 0 points 0 points    Immunizations Immunization History   Administered Date(s) Administered   Fluad Quad(high Dose 65+) 08/08/2019, 09/08/2022   Fluad Trivalent(High Dose 65+) 09/12/2023   Influenza Whole 08/12/2009   Influenza, High Dose Seasonal PF 09/01/2013, 09/16/2014, 09/01/2016, 08/28/2017   Influenza,inj,Quad PF,6+ Mos 08/15/2018   Influenza-Unspecified 09/01/2016, 08/28/2017, 09/01/2020   PFIZER(Purple Top)SARS-COV-2 Vaccination 02/02/2020, 03/04/2020, 11/26/2020   Pneumococcal Conjugate-13 04/15/2014   Pneumococcal Polysaccharide-23 04/15/2009, 06/09/2015   Tetanus 04/15/2014   Zoster Recombinant(Shingrix) 04/11/2023    Screening Tests Health Maintenance  Topic Date Due   FOOT EXAM  Never done   OPHTHALMOLOGY EXAM  Never done   Diabetic kidney evaluation - Urine ACR  Never done   Zoster Vaccines- Shingrix (2 of 2) 06/06/2023   DTaP/Tdap/Td (1 - Tdap) 09/11/2024 (Originally 04/16/2014)   INFLUENZA VACCINE  06/29/2024   HEMOGLOBIN A1C  08/31/2024   Diabetic kidney evaluation - eGFR measurement  03/09/2025   Medicare Annual Wellness (AWV)  04/10/2025   Pneumonia Vaccine 3+ Years old  Completed   Hepatitis C Screening  Completed   HPV VACCINES  Aged Out   Meningococcal B Vaccine  Aged Out   Colonoscopy  Discontinued   COVID-19 Vaccine  Discontinued    Health Maintenance  Health Maintenance Due  Topic Date Due   FOOT EXAM  Never done   OPHTHALMOLOGY EXAM  Never done   Diabetic kidney evaluation - Urine ACR  Never done   Zoster Vaccines- Shingrix (2 of 2) 06/06/2023   Health Maintenance Items Addressed: See Nurse Notes  Additional Screening:  Vision Screening: Recommended annual ophthalmology exams for early detection of glaucoma and other disorders of the eye.  Dental Screening: Recommended annual dental exams for proper oral hygiene  Community Resource Referral / Chronic Care Management: CRR required this visit?  No   CCM required this visit?  No   Plan:    I have personally reviewed and noted the  following in the patient's chart:   Medical and social history Use of alcohol, tobacco or illicit drugs  Current medications and supplements including opioid prescriptions. Patient is not currently taking opioid prescriptions. Functional ability and status Nutritional status Physical activity Advanced directives List of other physicians Hospitalizations, surgeries, and ER visits in previous 12 months Vitals Screenings to include cognitive, depression, and falls Referrals and appointments  In addition, I have reviewed and discussed with patient certain preventive protocols, quality metrics, and best practice recommendations. A written personalized care plan for preventive services as well as general preventive health recommendations were provided to patient.   Bruno Capri, LPN   9/81/1914   After Visit Summary: (MyChart) Due to this being a telephonic visit, the after visit summary with patients personalized plan was offered to patient via MyChart   Notes: Nothing significant to report at this time.

## 2024-04-10 NOTE — Patient Instructions (Signed)
 Mr. Vincent Stevens , Thank you for taking time out of your busy schedule to complete your Annual Wellness Visit with me. I enjoyed our conversation and look forward to speaking with you again next year. I, as well as your care team,  appreciate your ongoing commitment to your health goals. Please review the following plan we discussed and let me know if I can assist you in the future. Your Game plan/ To Do List    Referrals: If you haven't heard from the office you've been referred to, please reach out to them at the phone provided.   Follow up Visits: Next Medicare AWV with our clinical staff: yes 04/15/24 @ 8:40 a.m    Have you seen your provider in the last 6 months (3 months if uncontrolled diabetes)? No Next Office Visit with your provider: 09/13/24 @ 8:40 a.m   Clinician Recommendations:  Aim for 30 minutes of exercise or brisk walking, 6-8 glasses of water, and 5 servings of fruits and vegetables each day.       This is a list of the screening recommended for you and due dates:  Health Maintenance  Topic Date Due   Complete foot exam   Never done   Eye exam for diabetics  Never done   Yearly kidney health urinalysis for diabetes  Never done   Zoster (Shingles) Vaccine (2 of 2) 06/06/2023   DTaP/Tdap/Td vaccine (1 - Tdap) 09/11/2024*   Flu Shot  06/29/2024   Hemoglobin A1C  08/31/2024   Yearly kidney function blood test for diabetes  03/09/2025   Medicare Annual Wellness Visit  04/10/2025   Pneumonia Vaccine  Completed   Hepatitis C Screening  Completed   HPV Vaccine  Aged Out   Meningitis B Vaccine  Aged Out   Colon Cancer Screening  Discontinued   COVID-19 Vaccine  Discontinued  *Topic was postponed. The date shown is not the original due date.    Advanced directives: (Copy Requested) Please bring a copy of your health care power of attorney and living will to the office to be added to your chart at your convenience. You can mail to [facility] [address]  [place], [place] [address] or email to [email or web address] Advance Care Planning is important because it:  [x]  Makes sure you receive the medical care that is consistent with your values, goals, and preferences  [x]  It provides guidance to your family and loved ones and reduces their decisional burden about whether or not they are making the right decisions based on your wishes.  Follow the link provided in your after visit summary or read over the paperwork we have mailed to you to help you started getting your Advance Directives in place. If you need assistance in completing these, please reach out to us  so that we can help you!  See attachments for Preventive Care and Fall Prevention Tips.

## 2024-04-24 DIAGNOSIS — L821 Other seborrheic keratosis: Secondary | ICD-10-CM | POA: Diagnosis not present

## 2024-04-24 DIAGNOSIS — L814 Other melanin hyperpigmentation: Secondary | ICD-10-CM | POA: Diagnosis not present

## 2024-04-24 DIAGNOSIS — L570 Actinic keratosis: Secondary | ICD-10-CM | POA: Diagnosis not present

## 2024-04-24 DIAGNOSIS — Z85828 Personal history of other malignant neoplasm of skin: Secondary | ICD-10-CM | POA: Diagnosis not present

## 2024-04-24 DIAGNOSIS — L905 Scar conditions and fibrosis of skin: Secondary | ICD-10-CM | POA: Diagnosis not present

## 2024-04-24 DIAGNOSIS — Z08 Encounter for follow-up examination after completed treatment for malignant neoplasm: Secondary | ICD-10-CM | POA: Diagnosis not present

## 2024-04-24 DIAGNOSIS — D225 Melanocytic nevi of trunk: Secondary | ICD-10-CM | POA: Diagnosis not present

## 2024-04-24 DIAGNOSIS — Z85820 Personal history of malignant melanoma of skin: Secondary | ICD-10-CM | POA: Diagnosis not present

## 2024-07-05 ENCOUNTER — Other Ambulatory Visit: Payer: Self-pay | Admitting: *Deleted

## 2024-07-05 ENCOUNTER — Telehealth: Payer: Self-pay | Admitting: Family Medicine

## 2024-07-05 MED ORDER — TAMSULOSIN HCL 0.4 MG PO CAPS: 0.4000 mg | ORAL_CAPSULE | Freq: Every day | ORAL | 3 refills | Status: DC

## 2024-07-05 MED ORDER — LOSARTAN POTASSIUM 50 MG PO TABS: 50.0000 mg | ORAL_TABLET | Freq: Two times a day (BID) | ORAL | 1 refills | Status: DC

## 2024-07-05 NOTE — Telephone Encounter (Signed)
Rx send to [facility]

## 2024-07-05 NOTE — Telephone Encounter (Signed)
 Prescription Request  07/05/2024  LOV: 09/12/2023  What is the name of the medication or equipment? losartan  (COZAAR ) 50 MG tablet   tamsulosin  (FLOMAX ) 0.4 MG CAPS capsule    Have you contacted your pharmacy to request a refill? Yes   Which pharmacy would you like this sent to? [facility] [address] [place] - [address] Phone: [phone]  Fax: [phone]     Patient notified that their request is being sent to the clinical staff for review and that they should receive a response within 2 business days.    Please advise at Mobile [phone] (mobile)

## 2024-07-23 ENCOUNTER — Emergency Department (HOSPITAL_BASED_OUTPATIENT_CLINIC_OR_DEPARTMENT_OTHER)
Admission: EM | Admit: 2024-07-23 | Discharge: 2024-07-23 | Disposition: A | Discharge status: Home / Self Care | Source: Ambulatory Visit

## 2024-07-23 ENCOUNTER — Ambulatory Visit: Payer: Self-pay

## 2024-07-23 ENCOUNTER — Other Ambulatory Visit: Payer: Self-pay

## 2024-07-23 ENCOUNTER — Encounter (HOSPITAL_BASED_OUTPATIENT_CLINIC_OR_DEPARTMENT_OTHER): Payer: Self-pay | Admitting: Emergency Medicine

## 2024-07-23 ENCOUNTER — Emergency Department (HOSPITAL_BASED_OUTPATIENT_CLINIC_OR_DEPARTMENT_OTHER)

## 2024-07-23 DIAGNOSIS — R5383 Other fatigue: Secondary | ICD-10-CM | POA: Insufficient documentation

## 2024-07-23 DIAGNOSIS — R140 Abdominal distension (gaseous): Secondary | ICD-10-CM | POA: Diagnosis not present

## 2024-07-23 DIAGNOSIS — I959 Hypotension, unspecified: Secondary | ICD-10-CM | POA: Insufficient documentation

## 2024-07-23 DIAGNOSIS — Z7982 Long term (current) use of aspirin: Secondary | ICD-10-CM | POA: Insufficient documentation

## 2024-07-23 DIAGNOSIS — R0602 Shortness of breath: Secondary | ICD-10-CM | POA: Diagnosis not present

## 2024-07-23 LAB — COMPREHENSIVE METABOLIC PANEL WITH GFR
ALT: 16 U/L (ref 0–44)
AST: 34 U/L (ref 15–41)
Albumin: 4.3 g/dL (ref 3.5–5.0)
Alkaline Phosphatase: 65 U/L (ref 38–126)
Anion gap: 12 (ref 5–15)
BUN: 18 mg/dL (ref 8–23)
CO2: 21 mmol/L — ABNORMAL LOW (ref 22–32)
Calcium: 9.8 mg/dL (ref 8.9–10.3)
Chloride: 106 mmol/L (ref 98–111)
Creatinine, Ser: 1.05 mg/dL (ref 0.61–1.24)
GFR, Estimated: >60 mL/min (ref >60)
Glucose, Bld: 108 mg/dL — ABNORMAL HIGH (ref 70–99)
Potassium: 4.4 mmol/L (ref 3.5–5.1)
Sodium: 138 mmol/L (ref 135–145)
Total Bilirubin: 0.6 mg/dL (ref 0.0–1.2)
Total Protein: 6.7 g/dL (ref 6.5–8.1)

## 2024-07-23 LAB — URINALYSIS, ROUTINE W REFLEX MICROSCOPIC
Bilirubin Urine: NEGATIVE
Glucose, UA: NEGATIVE mg/dL
Hgb urine dipstick: NEGATIVE
Ketones, ur: NEGATIVE mg/dL
Leukocytes,Ua: NEGATIVE
Nitrite: NEGATIVE
Protein, ur: NEGATIVE mg/dL
Specific Gravity, Urine: 1.015 (ref 1.005–1.030)
pH: 7 (ref 5.0–8.0)

## 2024-07-23 LAB — CBC WITH DIFFERENTIAL/PLATELET
Abs Immature Granulocytes: 0.01 K/uL (ref 0.00–0.07)
Basophils Absolute: 0 K/uL (ref 0.0–0.1)
Basophils Relative: 1 %
Eosinophils Absolute: 0.4 K/uL (ref 0.0–0.5)
Eosinophils Relative: 7 %
HCT: 46.3 % (ref 39.0–52.0)
Hemoglobin: 15.4 g/dL (ref 13.0–17.0)
Immature Granulocytes: 0 %
Lymphocytes Relative: 20 %
Lymphs Abs: 1.2 K/uL (ref 0.7–4.0)
MCH: 29.2 pg (ref 26.0–34.0)
MCHC: 33.3 g/dL (ref 30.0–36.0)
MCV: 87.9 fL (ref 80.0–100.0)
Monocytes Absolute: 0.6 K/uL (ref 0.1–1.0)
Monocytes Relative: 9 %
Neutro Abs: 3.7 K/uL (ref 1.7–7.7)
Neutrophils Relative %: 63 %
Platelets: 177 K/uL (ref 150–400)
RBC: 5.27 MIL/uL (ref 4.22–5.81)
RDW: 13.1 % (ref 11.5–15.5)
WBC: 5.9 K/uL (ref 4.0–10.5)
nRBC: 0 % (ref 0.0–0.2)

## 2024-07-23 NOTE — Discharge Instructions (Addendum)
 As we discussed, please give the cardiologist a call.  There seems to be a new rhythm on your EKG.  They may want to perform a Holter monitor for you going forward.   In terms of your blood pressure, please hold the amlodipine .  Do not take this given your low blood pressures at home.  As we discussed, please take your blood pressure at least twice per day as well as your pulse rate and write this down on a piece of paper.  You can then take this piece of paper to your primary care physician so they can titrate your medication further  If anything changes such as recurrent hypotension then please bring him back to the ED.

## 2024-07-23 NOTE — ED Triage Notes (Signed)
 Sent by PCP for hypotension  (91/55). States been hypotensive intermittently for months.   States had bee stings yesterday and had EMS clear him then developed hives and SHOB.

## 2024-07-23 NOTE — Telephone Encounter (Signed)
 FYI, please see Triage note.

## 2024-07-23 NOTE — ED Notes (Signed)
 Discharge instructions and follow up care reviewed and explained, pt verbalized understanding and had no further questions on d/c. Pt caox4, ambulatory, NAD on d/c.

## 2024-07-23 NOTE — ED Provider Notes (Signed)
  EMERGENCY DEPARTMENT AT [facility] Provider Note   CSN: [id number] Arrival date & time: 07/23/24  9048     Patient presents with: Hypotension   Vincent Stevens is a 78 y.o. male.   HPI    Patient presents because of episodes of hypotension.  Patient states this has been a very chronic pain.  He states has been discussing this with both his primary care doctor as well as cardiologist.  He states that he has been told to continue his medication.  He has been taking his blood pressure here recently.  Has not been writing this down.  He states that his systolic was in the low 90s this morning before take any Medication.  Did not take the medication this morning.  Denies any syncope.  No near syncope.  Does endorse some chronic back pain.  Endorses some slight exertional shortness of breath which is at his baseline has been present for a very long time.  No changes in this.  No clear chest pain.  No hemoptysis.  No chest pain.  No nausea vomit diarrhea.  Chronic back pain.  No acute changes.  No bowel bladder continence.  No saddle anesthesia.  No recent falls.  There was a report that he possibly was stung by bees yesterday but patient states that happened on Thursday.  Previous medical history reviewed : Patient had called nurse at PCP office because of symptomatic hypotension.  Had reported being stung by bees yesterday.  Prior to Admission medications   Medication Sig Start Date End Date Taking? Authorizing Provider  amLODipine  (NORVASC ) 2.5 MG tablet TAKE 1 TABLET BY MOUTH EVERY DAY 11/28/23   Ladona Heinz, MD  aspirin  EC 81 MG tablet Take 1 tablet (81 mg total) by mouth daily. 08/18/18   Monetta Redell PARAS, MD  ibuprofen  (ADVIL ,MOTRIN ) 200 MG tablet Take 200 mg by mouth 2 (two) times daily as needed (arthritis apin).     [provider]  losartan  (COZAAR ) 50 MG tablet Take 1 tablet (50 mg total) by mouth 2 (two) times daily. Patient needs appointment for further  refills. 1 st attempt 07/05/24   Kennyth Worth HERO, MD  metFORMIN  (GLUCOPHAGE ) 500 MG tablet Take 500 mg by mouth daily with breakfast. 11/06/23   Ladona Heinz, MD  metoprolol  succinate (TOPROL -XL) 25 MG 24 hr tablet TAKE 1 TABLET BY MOUTH DAILY. TAKE WITH OR IMMEDIATELY FOLLOWING A MEAL. 11/28/23   Ladona Heinz, MD  Multiple Vitamin (MULTIVITAMIN WITH MINERALS) TABS tablet Take 1 tablet by mouth at bedtime.    [provider]  nitroGLYCERIN  (NITROSTAT ) 0.4 MG SL tablet Place 0.4 mg under the tongue every 5 (five) minutes as needed for chest pain.    [provider]  pantoprazole  (PROTONIX ) 40 MG tablet Take 1 tablet (40 mg total) by mouth 2 (two) times daily. 09/20/23   Kennyth Worth HERO, MD  simvastatin  (ZOCOR ) 80 MG tablet Take 1 tablet (80 mg total) by mouth daily. Patient needs an appointment for further refills. 3 rd/final attempt 02/21/24   Kennyth Worth HERO, MD  tamsulosin  (FLOMAX ) 0.4 MG CAPS capsule Take 1 capsule (0.4 mg total) by mouth daily. 07/05/24   Kennyth Worth HERO, MD  triamcinolone  ointment (KENALOG ) 0.5 % APPLY 1 APPLICATION TOPICALLY 2 (TWO) TIMES DAILY. Patient not taking: Reported on 04/10/2024 03/18/23   Kennyth Worth HERO, MD    Allergies: Sulfamethoxazole    Review of Systems  Constitutional:  Negative for chills and fever.  HENT:  Negative for ear pain and sore throat.   Eyes:  Negative for pain and visual disturbance.  Respiratory:  Negative for cough and shortness of breath.   Cardiovascular:  Negative for chest pain and palpitations.  Gastrointestinal:  Negative for abdominal pain and vomiting.  Genitourinary:  Negative for dysuria and hematuria.  Musculoskeletal:  Negative for arthralgias and back pain.  Skin:  Negative for color change and rash.  Neurological:  Negative for seizures and syncope.  All other systems reviewed and are negative.   Updated Vital Signs BP 113/67   Pulse (!) 48   Temp 97.7 F (36.5 C) (Oral)   Resp 18   SpO2 99%   Physical  Exam Vitals and nursing note reviewed.  Constitutional:      General: He is not in acute distress.    Appearance: He is well-developed.  HENT:     Head: Normocephalic and atraumatic.  Eyes:     Conjunctiva/sclera: Conjunctivae normal.  Cardiovascular:     Rate and Rhythm: Normal rate and regular rhythm.     Heart sounds: No murmur heard. Pulmonary:     Effort: Pulmonary effort is normal. No respiratory distress.     Breath sounds: Normal breath sounds.  Abdominal:     Palpations: Abdomen is soft.     Tenderness: There is no abdominal tenderness.  Musculoskeletal:        General: No swelling.     Cervical back: Neck supple.  Skin:    General: Skin is warm and dry.     Capillary Refill: Capillary refill takes less than 2 seconds.  Neurological:     Mental Status: He is alert.  Psychiatric:        Mood and Affect: Mood normal.     (all labs ordered are listed, but only abnormal results are displayed) Labs Reviewed  COMPREHENSIVE METABOLIC PANEL WITH GFR - Abnormal; Notable for the following components:      Result Value   CO2 21 (*)    Glucose, Bld 108 (*)    All other components within normal limits  CBC WITH DIFFERENTIAL/PLATELET  URINALYSIS, ROUTINE W REFLEX MICROSCOPIC    EKG: EKG Interpretation Date/Time:  Monday July 23 2024 10:39:12 EDT Ventricular Rate:  64 PR Interval:  133 QRS Duration:  91 QT Interval:  439 QTC Calculation: 491 R Axis:   -14  Text Interpretation: Sinus rhythm Supraventricular bigeminy Abnormal R-wave progression, early transition Borderline prolonged QT interval Baseline wander in lead(s) V5 Confirmed by Simon Rea [phone]) on 07/23/2024 11:18:21 AM  Radiology: [facility] Chest Portable 1 View Result Date: 07/23/2024 CLINICAL DATA:  78 year old male with shortness of breath, hypotension. EXAM: PORTABLE CHEST 1 VIEW COMPARISON:  Chest radiographs 04/10/2018 and earlier. FINDINGS: Portable AP upright view at 1015 hours. Chronic sternotomy. Lung  volumes and mediastinal contours are stable and within normal limits. Visualized tracheal air column is within normal limits. Allowing for portable technique the lungs are clear. No acute osseous abnormality identified. Paucity of bowel gas in the visible abdomen. IMPRESSION: No acute cardiopulmonary abnormality. Electronically Signed   By: VEAR Hurst M.D.   On: 07/23/2024 11:31     Procedures   Medications Ordered in the ED - No data to display                                  Medical Decision Making Amount and/or Complexity of Data Reviewed Labs: ordered. Radiology:  ordered.     Patient presents because of episodes of hypotension.  Patient states this has been a very chronic pain.  He states has been discussing this with both his primary care doctor as well as cardiologist.  He states that he has been told to continue his medication.  He has been taking his blood pressure here recently.  Has not been writing this down.  He states that his systolic was in the low 90s this morning before take any Medication.  Did not take the medication this morning.  Denies any syncope.  No near syncope.  Does endorse some chronic back pain.  Endorses some slight exertional shortness of breath which is at his baseline has been present for a very long time.  No changes in this.  No clear chest pain.  No hemoptysis.  No chest pain.  No nausea vomit diarrhea.  Chronic back pain.  No acute changes.  No bowel bladder continence.  No saddle anesthesia.  No recent falls.  There was a report that he possibly was stung by bees yesterday but patient states that happened on Thursday.  Previous medical history reviewed : Patient had called nurse at PCP office because of symptomatic hypotension.  Had reported being stung by bees yesterday.   Upon exam, patient had ankle stable.  A and O x 3.  GCS of 15.  Maps appropriate.  No tachycardia.  Afebrile.   Obtain EKG.  Question bigeminy.  Otherwise, no acute arrhythmia.  No  atrial fibrillation or flutter.   No chest pain.  No concerns for ACS pathology.   Did attain basic laboratory workup.  No large electrolyte derangements.  Potassium 4.4.  Otherwise, UA and laboratory workup clear.   Chest x-ray showed no cardiopulmonary process.   Patient remained normotensive throughout the ED stay.  Given his hypotension at home, I did recommend patient hold his amlodipine  going forward and to take his blood pressure 2-3 times per day and write down his blood pressure as well as pulse rate.  Recommend he take this piece of paper to his PCP so that way medication can be titrated further.  Also recommend following up with cardiologist given questionable bigeminy and may need Holter monitor going forward.   No concerns or any kind of infectious etiology driving hypovolemia.  As above, patient was stung by bees on Thursday.  No ongoing symptoms.  No concerns for anaphylaxis.        Final diagnoses:  Hypotension, unspecified hypotension type  Other fatigue    ED Discharge Orders     None          Simon Lavonia SAILOR, MD 07/23/24 1451

## 2024-07-23 NOTE — Telephone Encounter (Signed)
 Please note - triaged to ED for symptomatic hypotention but also reports being stung by bees yesterday. He reports EMS came and cleared him. States after they left, he developed hives and some SOB. Reports BP yesterday was WNL. States resolved in one hour with no treatment. Advised to call office after hospital d/c for f/u.   FYI Only or Action Required?: FYI only for provider.  Patient was last seen in primary care on 09/12/2023 by Kennyth Worth HERO, MD.  Called Nurse Triage reporting Triage.  Symptoms began today.  Interventions attempted: Nothing.  Symptoms are: unchanged.  Triage Disposition: Go to ED Now (or PCP Triage)  Patient/caregiver understands and will follow disposition?: Yes Reason for Disposition  [1] Fall in systolic BP > 20 mm Hg from normal AND [2] feeling weak or lightheaded  Answer Assessment - Initial Assessment Questions Patient would like his wife to transfer him to the ED. Advised patient to sit and call 911 if he becomes dizzy or lightheaded upon standing.  1. BLOOD PRESSURE: What is your blood pressure? Did you take at least two measurements 5 minutes apart?     91/55, 94/62  2. ONSET: When did you take your blood pressure?     This morning, states was WNL last night  3. HOW: How did you take your blood pressure? (e.g., visiting nurse, automatic home BP monitor)     Machine  4. HISTORY: Do you have a history of low blood pressure? What is your blood pressure normally?     Hx of HTN  5. MEDICINES: Are you taking any medicines for blood pressure? If Yes, ask: Have they been changed recently?     Metoprolol  and losartan , has not taken yet this morning  6. PULSE RATE: Do you know what your pulse rate is?      69  7. OTHER SYMPTOMS: Have you been sick recently? Have you had a recent injury?     States was  Protocols used: Blood Pressure - Low-A-AH

## 2024-07-25 ENCOUNTER — Telehealth: Payer: Self-pay | Admitting: Cardiology

## 2024-07-25 NOTE — Telephone Encounter (Signed)
 Patient would like to have Dr. Edwyna review ED notes if at all possible. He scheduled for 10/08 with Dr. Revankar. He would like to know if Dr. Edwyna thinks he should be seen sooner for follow up. Please advise.

## 2024-07-25 NOTE — Telephone Encounter (Signed)
Left VM and MyChart message.

## 2024-07-27 DIAGNOSIS — M25561 Pain in right knee: Secondary | ICD-10-CM | POA: Diagnosis not present

## 2024-07-27 DIAGNOSIS — M25562 Pain in left knee: Secondary | ICD-10-CM | POA: Diagnosis not present

## 2024-08-03 ENCOUNTER — Encounter: Payer: Self-pay | Admitting: Cardiology

## 2024-08-07 DIAGNOSIS — M179 Osteoarthritis of knee, unspecified: Secondary | ICD-10-CM | POA: Insufficient documentation

## 2024-08-07 DIAGNOSIS — M170 Bilateral primary osteoarthritis of knee: Secondary | ICD-10-CM | POA: Diagnosis not present

## 2024-08-07 DIAGNOSIS — M25561 Pain in right knee: Secondary | ICD-10-CM

## 2024-08-07 DIAGNOSIS — M1711 Unilateral primary osteoarthritis, right knee: Secondary | ICD-10-CM | POA: Diagnosis not present

## 2024-08-07 HISTORY — DX: Pain in right knee: M25.561

## 2024-08-07 HISTORY — DX: Osteoarthritis of knee, unspecified: M17.9

## 2024-08-12 ENCOUNTER — Encounter (HOSPITAL_COMMUNITY): Payer: Self-pay

## 2024-08-12 ENCOUNTER — Emergency Department (HOSPITAL_COMMUNITY)

## 2024-08-12 ENCOUNTER — Other Ambulatory Visit: Payer: Self-pay

## 2024-08-12 ENCOUNTER — Emergency Department (HOSPITAL_COMMUNITY)
Admission: EM | Admit: 2024-08-12 | Discharge: 2024-08-12 | Disposition: A | Discharge status: Home / Self Care | Attending: Emergency Medicine | Admitting: Emergency Medicine

## 2024-08-12 DIAGNOSIS — Z7984 Long term (current) use of oral hypoglycemic drugs: Secondary | ICD-10-CM | POA: Diagnosis not present

## 2024-08-12 DIAGNOSIS — Z7982 Long term (current) use of aspirin: Secondary | ICD-10-CM | POA: Diagnosis not present

## 2024-08-12 DIAGNOSIS — Z951 Presence of aortocoronary bypass graft: Secondary | ICD-10-CM | POA: Diagnosis not present

## 2024-08-12 DIAGNOSIS — I25118 Atherosclerotic heart disease of native coronary artery with other forms of angina pectoris: Secondary | ICD-10-CM

## 2024-08-12 DIAGNOSIS — Z79899 Other long term (current) drug therapy: Secondary | ICD-10-CM | POA: Diagnosis not present

## 2024-08-12 DIAGNOSIS — R079 Chest pain, unspecified: Secondary | ICD-10-CM | POA: Diagnosis not present

## 2024-08-12 DIAGNOSIS — I10 Essential (primary) hypertension: Secondary | ICD-10-CM | POA: Diagnosis not present

## 2024-08-12 DIAGNOSIS — I2510 Atherosclerotic heart disease of native coronary artery without angina pectoris: Secondary | ICD-10-CM | POA: Insufficient documentation

## 2024-08-12 DIAGNOSIS — E119 Type 2 diabetes mellitus without complications: Secondary | ICD-10-CM | POA: Insufficient documentation

## 2024-08-12 DIAGNOSIS — R0789 Other chest pain: Secondary | ICD-10-CM | POA: Insufficient documentation

## 2024-08-12 DIAGNOSIS — E785 Hyperlipidemia, unspecified: Secondary | ICD-10-CM | POA: Diagnosis not present

## 2024-08-12 HISTORY — DX: Atherosclerotic heart disease of native coronary artery with other forms of angina pectoris: I25.118

## 2024-08-12 LAB — BASIC METABOLIC PANEL WITH GFR
Anion gap: 8 (ref 5–15)
BUN: 26 mg/dL — ABNORMAL HIGH (ref 8–23)
CO2: 20 mmol/L — ABNORMAL LOW (ref 22–32)
Calcium: 8.8 mg/dL — ABNORMAL LOW (ref 8.9–10.3)
Chloride: 105 mmol/L (ref 98–111)
Creatinine, Ser: 1.14 mg/dL (ref 0.61–1.24)
GFR, Estimated: >60 mL/min (ref >60)
Glucose, Bld: 142 mg/dL — ABNORMAL HIGH (ref 70–99)
Potassium: 4.6 mmol/L (ref 3.5–5.1)
Sodium: 133 mmol/L — ABNORMAL LOW (ref 135–145)

## 2024-08-12 LAB — TROPONIN I (HIGH SENSITIVITY)
Troponin I (High Sensitivity): 6 ng/L (ref <18)
Troponin I (High Sensitivity): 6 ng/L (ref <18)

## 2024-08-12 LAB — CBC
HCT: 46.8 % (ref 39.0–52.0)
Hemoglobin: 15.3 g/dL (ref 13.0–17.0)
MCH: 29.7 pg (ref 26.0–34.0)
MCHC: 32.7 g/dL (ref 30.0–36.0)
MCV: 90.7 fL (ref 80.0–100.0)
Platelets: 206 K/uL (ref 150–400)
RBC: 5.16 MIL/uL (ref 4.22–5.81)
RDW: 13.3 % (ref 11.5–15.5)
WBC: 9.2 K/uL (ref 4.0–10.5)
nRBC: 0 % (ref 0.0–0.2)

## 2024-08-12 LAB — CBG MONITORING, ED: Glucose-Capillary: 144 mg/dL — ABNORMAL HIGH (ref 70–99)

## 2024-08-12 MED ORDER — LOSARTAN POTASSIUM 50 MG PO TABS: 50.0000 mg | ORAL_TABLET | Freq: Every day | ORAL | 1 refills | Status: DC

## 2024-08-12 MED ORDER — AMLODIPINE BESYLATE 2.5 MG PO TABS: 2.5000 mg | ORAL_TABLET | Freq: Every day | ORAL | 3 refills | Status: DC

## 2024-08-12 MED ORDER — SODIUM CHLORIDE 0.9 % IV BOLUS
500.0000 mL | Freq: Once | INTRAVENOUS | Status: AC
  Administered 2024-08-12: 500 mL via INTRAVENOUS

## 2024-08-12 MED ORDER — ISOSORBIDE MONONITRATE ER 30 MG PO TB24
30.0000 mg | ORAL_TABLET | Freq: Every day | ORAL | 0 refills | Status: DC
Start: 2024-08-12 — End: 2024-08-15

## 2024-08-12 NOTE — ED Triage Notes (Signed)
 Pt states he went out side and when he came back in he sat down due to developing chest pain, he decided to take 1 nitroglycerin  and the next thing he remembers is waking up to his family around him shaking him. Family states he was dosing off in the car on the way here.

## 2024-08-12 NOTE — ED Provider Notes (Signed)
 [place] EMERGENCY DEPARTMENT AT [facility] Provider Note   CSN: [id number] Arrival date & time: 08/12/24  1021     Patient presents with: Loss of Consciousness and Chest Pain  HPI Vincent Stevens is a 78 y.o. male with CAD s/p CABG in 2019, diabetes, hypertension, dyslipidemia presenting for chest pain.  Started earlier today.  He states he was out weed eating when he developed a left-sided chest pain.  It was dull and seem to be worse with exertion.  Denies diaphoresis or shortness of breath.  He went inside and sat down and took 1 nitroglycerin  and started to feel drunk and dizzy.  He is unsure if it helped with the chest pain.  At this time he is asymptomatic.  Reports that he was recently seen at [facility] last month for hypotension and stated that they advised him to hold his amlodipine .    Loss of Consciousness Associated symptoms: chest pain   Chest Pain Associated symptoms: syncope        Prior to Admission medications   Medication Sig Start Date End Date Taking? Authorizing Provider  isosorbide  mononitrate (IMDUR ) 30 MG 24 hr tablet Take 1 tablet (30 mg total) by mouth daily. 08/12/24  Yes Lang Norleen POUR, PA-C  amLODipine  (NORVASC ) 2.5 MG tablet Take 1 tablet (2.5 mg total) by mouth at bedtime. 08/12/24   Lang Norleen POUR, PA-C  aspirin  EC 81 MG tablet Take 1 tablet (81 mg total) by mouth daily. 08/18/18   Monetta Redell PARAS, MD  ibuprofen  (ADVIL ,MOTRIN ) 200 MG tablet Take 200 mg by mouth 2 (two) times daily as needed (arthritis apin).     [provider]  losartan  (COZAAR ) 50 MG tablet Take 1 tablet (50 mg total) by mouth at bedtime. Patient needs appointment for further refills. 1 st attempt 08/12/24   Devansh Riese K, PA-C  metFORMIN  (GLUCOPHAGE ) 500 MG tablet Take 500 mg by mouth daily with breakfast. 11/06/23   Ladona Heinz, MD  metoprolol  succinate (TOPROL -XL) 25 MG 24 hr tablet TAKE 1 TABLET BY MOUTH DAILY. TAKE WITH OR IMMEDIATELY FOLLOWING A  MEAL. 11/28/23   Ladona Heinz, MD  Multiple Vitamin (MULTIVITAMIN WITH MINERALS) TABS tablet Take 1 tablet by mouth at bedtime.    [provider]  nitroGLYCERIN  (NITROSTAT ) 0.4 MG SL tablet Place 0.4 mg under the tongue every 5 (five) minutes as needed for chest pain.    [provider]  pantoprazole  (PROTONIX ) 40 MG tablet Take 1 tablet (40 mg total) by mouth 2 (two) times daily. 09/20/23   Kennyth Worth HERO, MD  simvastatin  (ZOCOR ) 80 MG tablet Take 1 tablet (80 mg total) by mouth daily. Patient needs an appointment for further refills. 3 rd/final attempt 02/21/24   Kennyth Worth HERO, MD  tamsulosin  (FLOMAX ) 0.4 MG CAPS capsule Take 1 capsule (0.4 mg total) by mouth daily. 07/05/24   Kennyth Worth HERO, MD  triamcinolone  ointment (KENALOG ) 0.5 % APPLY 1 APPLICATION TOPICALLY 2 (TWO) TIMES DAILY. Patient not taking: Reported on 04/10/2024 03/18/23   Kennyth Worth HERO, MD    Allergies: Sulfamethoxazole    Review of Systems  Cardiovascular:  Positive for chest pain and syncope.    Updated Vital Signs BP (!) 112/56   Pulse (!) 48   Temp 97.8 F (36.6 C) (Temporal)   Resp (!) 21   SpO2 99%   Physical Exam Vitals and nursing note reviewed.  HENT:     Head: Normocephalic and atraumatic.  Mouth/Throat:     Mouth: Mucous membranes are moist.  Eyes:     General:        Right eye: No discharge.        Left eye: No discharge.     Conjunctiva/sclera: Conjunctivae normal.  Cardiovascular:     Rate and Rhythm: Normal rate and regular rhythm.     Pulses: Normal pulses.     Heart sounds: Normal heart sounds.  Pulmonary:     Effort: Pulmonary effort is normal.     Breath sounds: Normal breath sounds.  Abdominal:     General: Abdomen is flat.     Palpations: Abdomen is soft.  Skin:    General: Skin is warm and dry.  Neurological:     General: No focal deficit present.  Psychiatric:        Mood and Affect: Mood normal.     (all labs ordered are listed, but only abnormal  results are displayed) Labs Reviewed  BASIC METABOLIC PANEL WITH GFR - Abnormal; Notable for the following components:      Result Value   Sodium 133 (*)    CO2 20 (*)    Glucose, Bld 142 (*)    BUN 26 (*)    Calcium  8.8 (*)    All other components within normal limits  CBG MONITORING, ED - Abnormal; Notable for the following components:   Glucose-Capillary 144 (*)    All other components within normal limits  CBC  TROPONIN I (HIGH SENSITIVITY)  TROPONIN I (HIGH SENSITIVITY)    EKG: EKG Interpretation Date/Time:  Sunday August 12 2024 10:30:25 EDT Ventricular Rate:  62 PR Interval:  120 QRS Duration:  86 QT Interval:  396 QTC Calculation: 401 R Axis:   -7  Text Interpretation: Normal sinus rhythm Normal ECG When compared with ECG of 23-Jul-2024 10:39, PREVIOUS ECG IS PRESENT similar to previous Confirmed by Bari Flank [phone]) on 08/12/2024 11:49:58 AM  Radiology: [facility] Chest 2 View Result Date: 08/12/2024 EXAM: 2 VIEW(S) XRAY OF THE CHEST 08/12/2024 10:59:00 AM COMPARISON: 07/23/2024 CLINICAL HISTORY: Chest pain. Ordered for chest pain. He notes h/o cardiac bypass and takes nitroglycerin . Per triage notes: Pt states he went out side and when he came back in he sat down due to developing chest pain, he decided to take 1 nitroglycerin  and the next thing he remembers is waking up to his family around him shaking him. Family states he was dosing off in the car on the way here. FINDINGS: LUNGS AND PLEURA: No focal pulmonary opacity. No pulmonary edema. No pleural effusion. No pneumothorax. HEART AND MEDIASTINUM: CABG markers noted. Sternotomy wires noted. No acute abnormality of the cardiac and mediastinal silhouettes. BONES AND SOFT TISSUES: Multiple level vertebral endplate spurring in lower thoracic spine. No acute osseous abnormality. IMPRESSION: 1. No acute process. Electronically signed by: Waddell Calk MD 08/12/2024 11:16 AM EDT RP Workstation: [id number]     Procedures    Medications Ordered in the ED  sodium chloride  0.9 % bolus 500 mL (0 mLs Intravenous Stopped 08/12/24 1310)    Clinical Course as of 08/12/24 1512  Sun Aug 12, 2024  1452 Per Dr. Michele, decrease Losartan  50 to once a day at night. Amlodipine  2.5 at night and metroprolol 25 in the morning. At imdur  30 in the morning. Hold for systolic pressure < 100.   [JR]    Clinical Course User Index [JR] Maleena Eddleman K, PA-C  HEART Score: 5                    Medical Decision Making Amount and/or Complexity of Data Reviewed Labs: ordered. Radiology: ordered.   Initial Impression and Ddx 78 yo well-appearing male presenting for chest pain.  Exam is unremarkable.  DDx includes ACS, PE, aortic dissection, pneumothorax, arrhythmia, other. Patient PMH that increases complexity of ED encounter:  CAD s/p CABG in 2019, diabetes, hypertension, dyslipidemia  Interpretation of Diagnostics - I independent reviewed and interpreted the labs as followed: non acute, negative troponins x 2  - I independently visualized the following imaging with scope of interpretation limited to determining acute life threatening conditions related to emergency care: CXR, which revealed no acute process  -I personally reviewed and interpreted EKG which revealed NSR  Patient Reassessment and Ultimate Disposition/Management On reassessment still chest pain-free.  Discussed patient with Dr. Michele of cardiology who reviewed the case including his cath and stress test from late last year.  He states that because his chest pain today was very similar to when he had chest pain last year just prior to the cath it would be reasonable for him to discharge with close follow-up with cardiology.  He also recommended adjustment of his blood pressure medications given that his blood pressure has been somewhat soft at home and during this encounter.  Also recommended adding Imdur  for antianginal treatment. Recommendations are  above.  Discussed this plan with the patient and he was agreeable.  We also discussed any changes to his antihypertensives.  Discussed return precautions.  Submitted ambulatory referral to cardiology.  Discharged good condition.  Patient management required discussion with the following services or consulting groups:  Cardiology  Complexity of Problems Addressed Acute complicated illness or Injury  Additional Data Reviewed and Analyzed Further history obtained from: Past medical history and medications listed in the EMR and Prior ED visit notes  Patient Encounter Risk Assessment Consideration of hospitalization      Final diagnoses:  Chest pain, unspecified type    ED Discharge Orders          Ordered    amLODipine  (NORVASC ) 2.5 MG tablet  Daily at bedtime        08/12/24 1456    losartan  (COZAAR ) 50 MG tablet  Daily at bedtime       Note to Pharmacy: Patient needs appointment for further refills. 1 st attempt   08/12/24 1456    isosorbide  mononitrate (IMDUR ) 30 MG 24 hr tablet  Daily        08/12/24 1456    Ambulatory referral to Cardiology       Comments: If you have not heard from the Cardiology office within the next 72 hours please call [phone].   08/12/24 1503               Lang Norleen POUR, PA-C 08/12/24 1513    Horton, Roxie HERO, DO 08/12/24 1549

## 2024-08-12 NOTE — Progress Notes (Signed)
 ON-CALL CARDIOLOGY 08/12/24  Patient's name: Vincent Stevens.   MRN: [id number].    DOB: [birth year] Primary care provider: Kennyth Worth HERO, MD. Primary cardiologist: Edwyna Jennifer SAUNDERS, MD   Requesting provider: Norleen Essex, [facility], location ED at [facility] Consulting physician: Madonna Large, DO, [facility], location [facility]  Interaction regarding this patient's care today: Requesting provider reached out to discuss his case as patient presents to the hospital with a chief complaint of chest pain  From my discussion with Mr. Essex patient presented to the hospital with chest pain, he was outside weed eating and noted left-sided chest pain, went inside took 1 sublingual nitroglycerin  tablet and was not sure if the pain improved.  Given his history and risk factors he came to the ED for further evaluation and management.  Workup in the ER illustrates no acute kidney injury.   High sensitive troponins negative x 2. EKG nonischemic ED provider states that patient is wanting to go home.  Cardiology called for further recommendations.  In addition, he had a recent ER visit for hypotension and amlodipine  was held.  Data reviewed:     Latest Ref Rng & Units 08/12/2024   10:41 AM 07/23/2024   10:39 AM 11/02/2023    8:49 AM  CBC  WBC 4.0 - 10.5 K/uL 9.2  5.9  6.7   Hemoglobin 13.0 - 17.0 g/dL 84.6  84.5  84.0   Hematocrit 39.0 - 52.0 % 46.8  46.3  48.2   Platelets 150 - 400 K/uL 206  177  193       Latest Ref Rng & Units 08/12/2024   10:41 AM 07/23/2024   10:39 AM 03/09/2024   11:05 AM  BMP  Glucose 70 - 99 mg/dL 857  891  896   BUN 8 - 23 mg/dL 26  18  17    Creatinine 0.61 - 1.24 mg/dL 8.85  8.94  8.93   BUN/Creat Ratio 10 - 24   16   Sodium 135 - 145 mmol/L 133  138  142   Potassium 3.5 - 5.1 mmol/L 4.6  4.4  5.2   Chloride 98 - 111 mmol/L 105  106  105   CO2 22 - 32 mmol/L 20  21  22    Calcium  8.9 - 10.3 mg/dL 8.8  9.8  9.4    Cardiac Panel (last 3  results) Recent Labs    08/12/24 1041 08/12/24 1313  TROPONINIHS 6 6    Independently reviewed: Sinus rhythm, 62 bpm, without underlying ischemic injury pattern, similar to prior tracing.  Left heart catheterization December 2024 Hemodynamic data: LV 150/8, EDP 16 mmHg.  Ao 153/98, mean 98 mmHg.  There is no pressure gradient across the aortic valve.   Angiographic data: RCA: Dominant and a very large caliber vessel.  Gives origin to 2 large PDA and a large PL branch.  Superior PDA branch has a focal 80 to 90% mid stenosis.  Ostial and proximal RCA has focal 30% stenosis. A faint collaterals are noted from the RCA to the CX. LM: Occluded. SVG to RCA: Occluded. SVG to OM: Widely patent.  OM1 and 2 very small.  Slow flow is evident in the graft with size mismatch. LIMA to LAD: Widely patent.  Distal LAD is diffusely diseased.      Impression and recommendations: Symptoms of angina are related to small vessel disease.  Recommend antianginal therapy, metoprolol  succinate 25 mg daily and amlodipine  2.5 mg daily have been started.  He  will be discharged home today.  Impression and recommendations: Coronary artery disease with history of surgical revascularization and stable angina Diabetes mellitus Dyslipidemia  Patient presents to the the ED with chest pain, symptoms concerning for cardiac discomfort. Patient has known history of coronary artery disease status post surgical revascularization and known history of stable angina. His antihypertensive medications were recently down titrated due to hypotension. After presenting to the ED patient is chest pain-free and wishes to go home.  And therefore cardiology called for further recommendations.  Independent review of his chart as mentioned above notes high sensitive troponins negative x 2, EKG nonischemic, and his last heart catheterization was approximately 9 months ago in December 2024 which noted small vessel disease.  He has known  occlusion of one of his bypass grafts.  With reduction in antianginal therapy I suspect the patient was having worsening of his underlying chronic anginal discomfort.   Recommended uptitration of antianginal therapy. Continue Toprol -XL 25 mg p.o. every morning Restart amlodipine  2.5 mg p.o. every afternoon Reduce losartan  50 mg p.o. twice daily to losartan  50 mg p.o. every afternoon Start Imdur  30 mg p.o. every morning Use sublingual nitroglycerin  tablets on as needed basis Patient is made aware of avoiding PDE 5 inhibitors due to long acting nitrates.  Hold if SBP less than 100 mmHg He is advised to come back to the ED/call 911 if chest pain resurfaces.  Will arrange outpatient follow-up sooner than originally scheduled.  I suspect he has underlying small vessel disease contributing to his angina.  If he continues to have symptoms further workup could be considered possible cardiac PET/CT.  Will defer long-term management/guidance to primary cardiology.  ER provider informs me that patient is agreeable with the plan of care and his questions and concerns were addressed.  Telephone encounter total time: 24 minutes.   Madonna Michele HAS, [facility]  A Division of [facility] [address], [place], [place] [address]  08/12/2024 3:41 PM

## 2024-08-12 NOTE — Discharge Instructions (Addendum)
 Evaluation today was overall reassuring.  As we discussed, cardiology is recommending that you take your Cozaar  only once at night, resume the amlodipine  at night, start taking Imdur  and take your metoprolol  as prescribed to you.  If your chest pain returns, you have shortness of breath, dizziness or lightheadedness or any other concerning symptom please return to the ED for further evaluation.  Also please stop taking all BP meds if systolic blood pressure is less than 100.

## 2024-08-12 NOTE — ED Triage Notes (Signed)
 Pt arrives with family who reports they found him slumped over when they returned home this morning. Pt alert and says he took a nitroglycerin , denies CP at this time. Just states he feels dizzy. Family reports recent changes to blood pressure meds.

## 2024-08-13 ENCOUNTER — Other Ambulatory Visit: Payer: Self-pay

## 2024-08-13 ENCOUNTER — Observation Stay (HOSPITAL_COMMUNITY): Admission: EM | Admit: 2024-08-13 | Discharge: 2024-08-15 | Disposition: A | Discharge status: Home / Self Care

## 2024-08-13 ENCOUNTER — Emergency Department (HOSPITAL_COMMUNITY)

## 2024-08-13 DIAGNOSIS — I25118 Atherosclerotic heart disease of native coronary artery with other forms of angina pectoris: Secondary | ICD-10-CM | POA: Diagnosis not present

## 2024-08-13 DIAGNOSIS — N200 Calculus of kidney: Secondary | ICD-10-CM | POA: Insufficient documentation

## 2024-08-13 DIAGNOSIS — R300 Dysuria: Secondary | ICD-10-CM | POA: Diagnosis not present

## 2024-08-13 DIAGNOSIS — I951 Orthostatic hypotension: Secondary | ICD-10-CM

## 2024-08-13 DIAGNOSIS — R109 Unspecified abdominal pain: Secondary | ICD-10-CM | POA: Diagnosis not present

## 2024-08-13 DIAGNOSIS — N401 Enlarged prostate with lower urinary tract symptoms: Secondary | ICD-10-CM | POA: Diagnosis not present

## 2024-08-13 DIAGNOSIS — E119 Type 2 diabetes mellitus without complications: Secondary | ICD-10-CM | POA: Insufficient documentation

## 2024-08-13 DIAGNOSIS — Z794 Long term (current) use of insulin: Secondary | ICD-10-CM | POA: Insufficient documentation

## 2024-08-13 DIAGNOSIS — Z951 Presence of aortocoronary bypass graft: Secondary | ICD-10-CM | POA: Diagnosis not present

## 2024-08-13 DIAGNOSIS — R001 Bradycardia, unspecified: Secondary | ICD-10-CM

## 2024-08-13 DIAGNOSIS — R4182 Altered mental status, unspecified: Secondary | ICD-10-CM | POA: Diagnosis not present

## 2024-08-13 DIAGNOSIS — Z87891 Personal history of nicotine dependence: Secondary | ICD-10-CM | POA: Insufficient documentation

## 2024-08-13 DIAGNOSIS — M6281 Muscle weakness (generalized): Secondary | ICD-10-CM | POA: Diagnosis not present

## 2024-08-13 DIAGNOSIS — N400 Enlarged prostate without lower urinary tract symptoms: Secondary | ICD-10-CM | POA: Diagnosis not present

## 2024-08-13 DIAGNOSIS — R410 Disorientation, unspecified: Principal | ICD-10-CM | POA: Insufficient documentation

## 2024-08-13 DIAGNOSIS — I10 Essential (primary) hypertension: Secondary | ICD-10-CM | POA: Diagnosis present

## 2024-08-13 DIAGNOSIS — R7989 Other specified abnormal findings of blood chemistry: Secondary | ICD-10-CM | POA: Diagnosis not present

## 2024-08-13 DIAGNOSIS — E785 Hyperlipidemia, unspecified: Secondary | ICD-10-CM | POA: Diagnosis not present

## 2024-08-13 DIAGNOSIS — K5790 Diverticulosis of intestine, part unspecified, without perforation or abscess without bleeding: Secondary | ICD-10-CM | POA: Diagnosis not present

## 2024-08-13 DIAGNOSIS — R8290 Unspecified abnormal findings in urine: Secondary | ICD-10-CM | POA: Insufficient documentation

## 2024-08-13 DIAGNOSIS — Z79899 Other long term (current) drug therapy: Secondary | ICD-10-CM | POA: Diagnosis not present

## 2024-08-13 DIAGNOSIS — E088 Diabetes mellitus due to underlying condition with unspecified complications: Secondary | ICD-10-CM | POA: Diagnosis present

## 2024-08-13 DIAGNOSIS — I152 Hypertension secondary to endocrine disorders: Secondary | ICD-10-CM | POA: Diagnosis present

## 2024-08-13 DIAGNOSIS — Z7982 Long term (current) use of aspirin: Secondary | ICD-10-CM | POA: Diagnosis not present

## 2024-08-13 DIAGNOSIS — R944 Abnormal results of kidney function studies: Secondary | ICD-10-CM | POA: Diagnosis not present

## 2024-08-13 DIAGNOSIS — R55 Syncope and collapse: Secondary | ICD-10-CM | POA: Diagnosis not present

## 2024-08-13 DIAGNOSIS — Z1152 Encounter for screening for COVID-19: Secondary | ICD-10-CM | POA: Diagnosis not present

## 2024-08-13 DIAGNOSIS — I209 Angina pectoris, unspecified: Secondary | ICD-10-CM | POA: Diagnosis present

## 2024-08-13 DIAGNOSIS — E1169 Type 2 diabetes mellitus with other specified complication: Secondary | ICD-10-CM | POA: Diagnosis present

## 2024-08-13 DIAGNOSIS — N3289 Other specified disorders of bladder: Secondary | ICD-10-CM | POA: Diagnosis not present

## 2024-08-13 LAB — CBC WITH DIFFERENTIAL/PLATELET
Abs Immature Granulocytes: 0.03 K/uL (ref 0.00–0.07)
Basophils Absolute: 0 K/uL (ref 0.0–0.1)
Basophils Relative: 0 %
Eosinophils Absolute: 0.1 K/uL (ref 0.0–0.5)
Eosinophils Relative: 1 %
HCT: 47.2 % (ref 39.0–52.0)
Hemoglobin: 15.1 g/dL (ref 13.0–17.0)
Immature Granulocytes: 0 %
Lymphocytes Relative: 16 %
Lymphs Abs: 1.5 K/uL (ref 0.7–4.0)
MCH: 29.4 pg (ref 26.0–34.0)
MCHC: 32 g/dL (ref 30.0–36.0)
MCV: 91.8 fL (ref 80.0–100.0)
Monocytes Absolute: 0.7 K/uL (ref 0.1–1.0)
Monocytes Relative: 7 %
Neutro Abs: 7.3 K/uL (ref 1.7–7.7)
Neutrophils Relative %: 76 %
Platelets: 208 K/uL (ref 150–400)
RBC: 5.14 MIL/uL (ref 4.22–5.81)
RDW: 13.4 % (ref 11.5–15.5)
WBC: 9.6 K/uL (ref 4.0–10.5)
nRBC: 0 % (ref 0.0–0.2)

## 2024-08-13 LAB — URINALYSIS, ROUTINE W REFLEX MICROSCOPIC
Bilirubin Urine: NEGATIVE
Glucose, UA: NEGATIVE mg/dL
Hgb urine dipstick: NEGATIVE
Ketones, ur: NEGATIVE mg/dL
Nitrite: NEGATIVE
Protein, ur: NEGATIVE mg/dL
Specific Gravity, Urine: 1.014 (ref 1.005–1.030)
pH: 5 (ref 5.0–8.0)

## 2024-08-13 LAB — TROPONIN I (HIGH SENSITIVITY)
Troponin I (High Sensitivity): 5 ng/L (ref <18)
Troponin I (High Sensitivity): 5 ng/L (ref <18)

## 2024-08-13 LAB — CBG MONITORING, ED: Glucose-Capillary: 126 mg/dL — ABNORMAL HIGH (ref 70–99)

## 2024-08-13 LAB — COMPREHENSIVE METABOLIC PANEL WITH GFR
ALT: 28 U/L (ref 0–44)
AST: 26 U/L (ref 15–41)
Albumin: 3.8 g/dL (ref 3.5–5.0)
Alkaline Phosphatase: 41 U/L (ref 38–126)
Anion gap: 11 (ref 5–15)
BUN: 28 mg/dL — ABNORMAL HIGH (ref 8–23)
CO2: 22 mmol/L (ref 22–32)
Calcium: 9.3 mg/dL (ref 8.9–10.3)
Chloride: 103 mmol/L (ref 98–111)
Creatinine, Ser: 1.27 mg/dL — ABNORMAL HIGH (ref 0.61–1.24)
GFR, Estimated: 57 mL/min — ABNORMAL LOW (ref >60)
Glucose, Bld: 162 mg/dL — ABNORMAL HIGH (ref 70–99)
Potassium: 4.8 mmol/L (ref 3.5–5.1)
Sodium: 136 mmol/L (ref 135–145)
Total Bilirubin: 1.1 mg/dL (ref 0.0–1.2)
Total Protein: 6.3 g/dL — ABNORMAL LOW (ref 6.5–8.1)

## 2024-08-13 LAB — RESP PANEL BY RT-PCR (RSV, FLU A&B, COVID)  RVPGX2
Influenza A by PCR: NEGATIVE
Influenza B by PCR: NEGATIVE
Resp Syncytial Virus by PCR: NEGATIVE
SARS Coronavirus 2 by RT PCR: NEGATIVE

## 2024-08-13 MED ORDER — LACTATED RINGERS IV BOLUS
1000.0000 mL | Freq: Once | INTRAVENOUS | Status: AC
  Administered 2024-08-13: 1000 mL via INTRAVENOUS

## 2024-08-13 MED ORDER — SODIUM CHLORIDE 0.9 % IV SOLN
1.0000 g | Freq: Once | INTRAVENOUS | Status: AC
  Administered 2024-08-13: 1 g via INTRAVENOUS
  Filled 2024-08-13: qty 10

## 2024-08-13 NOTE — ED Provider Notes (Signed)
 [facility] Emergency Department Provider Note MRN:  [id number]  Arrival date & time: 08/14/24     Chief Complaint   Altered Mental Status   History of Present Illness   Vincent Stevens is a 78 y.o. year-old male presents to the ED with chief complaint of confusion.  Onset was yesterday.  Wife states that he had been having some pain in his chest that started yesterday.  States that he took a nitroglycerin  and thinks that he passed out.  Wife found him slumped over the computer.  Was seen yesterday and then released.  Wife states that patient went to ortho today for a knee injection, but at the ortho clinic patient seem confused and was acting right and sent patient to the ER.  Patient reports that he has been having dizziness, confusion, and trouble remembering events throughout the day.    Wife states that he has been congested for about a week. States that he has been having some mild intermittent dysuria.  SABRA  History provided by patient.   Review of Systems  Pertinent positive and negative review of systems noted in HPI.    Physical Exam   Vitals:   08/14/24 0015 08/14/24 0045  BP: (!) 130/116 (!) 141/93  Pulse: 61 (!) 54  Resp: (!) 22 10  Temp:    SpO2: 99% 100%    CONSTITUTIONAL:  non toxic-appearing, NAD NEURO:  Alert and oriented x 3, CN 3-12 grossly intact EYES:  eyes equal and reactive ENT/NECK:  Supple, no stridor  CARDIO:  normal rate, regular rhythm, appears well-perfused  PULM:  No respiratory distress, CTAB GI/GU:  non-distended, no focal tenderness MSK/SPINE:  No gross deformities, no edema, moves all extremities  SKIN:  no rash, atraumatic   *Additional and/or pertinent findings included in MDM below  Diagnostic and Interventional Summary    EKG Interpretation Date/Time:  Monday August 13 2024 14:35:14 EDT Ventricular Rate:  62 PR Interval:  138 QRS Duration:  80 QT Interval:  406 QTC Calculation: 412 R  Axis:   -3  Text Interpretation: Sinus rhythm with marked sinus arrhythmia Minimal voltage criteria for LVH, may be normal variant ( R in aVL ) Borderline ECG When compared with ECG of 12-Aug-2024 10:30, PREVIOUS ECG IS PRESENT Confirmed by Simon Rea [phone]) on 08/13/2024 9:42:00 PM       Labs Reviewed  COMPREHENSIVE METABOLIC PANEL WITH GFR - Abnormal; Notable for the following components:      Result Value   Glucose, Bld 162 (*)    BUN 28 (*)    Creatinine, Ser 1.27 (*)    Total Protein 6.3 (*)    GFR, Estimated 57 (*)    All other components within normal limits  URINALYSIS, ROUTINE W REFLEX MICROSCOPIC - Abnormal; Notable for the following components:   Leukocytes,Ua SMALL (*)    Bacteria, UA RARE (*)    All other components within normal limits  CBG MONITORING, ED - Abnormal; Notable for the following components:   Glucose-Capillary 126 (*)    All other components within normal limits  RESP PANEL BY RT-PCR (RSV, FLU A&B, COVID)  RVPGX2  CBC WITH DIFFERENTIAL/PLATELET  TROPONIN I (HIGH SENSITIVITY)  TROPONIN I (HIGH SENSITIVITY)    CT HEAD WO CONTRAST ( )  Final Result    CT Renal Stone Study  Final Result      Medications  lactated ringers  bolus 1,000 mL (0 mLs Intravenous Stopped 08/14/24 0026)  cefTRIAXone  (ROCEPHIN ) 1 g in  sodium chloride  0.9 % 100 mL IVPB (0 g Intravenous Stopped 08/13/24 2346)     Procedures  /  Critical Care Procedures  ED Course and Medical Decision Making  I have reviewed the triage vital signs, the nursing notes, and pertinent available records from the EMR.  Social Determinants Affecting Complexity of Care: Patient has no clinically significant social determinants affecting this chief complaint..   ED Course:    Medical Decision Making Patient here with several episodes of intermittent confusion and hypotension as well as suspected syncope over the past few days.  He states that he has had some dysuria, but denies any fever or  cough.  Denies any other infectious symptoms.  He has recently had his losartan  dose decreased, he may still be overmedicated and subsequently hypotensive.  His urinalysis is slightly abnormal, I will cover him with Rocephin  because of reported dysuria.  He does drop his blood pressure during orthostatic testing.  Will consult hospitalist for admission.  CT head is negative.  CT abdomen/pelvis shows nephrolithiasis, but no evidence of ureterolithiasis or infectious process.  Creatinine is slightly increased at 1.27, up from 1.14 yesterday.  Amount and/or Complexity of Data Reviewed Labs: ordered. Radiology: ordered.  Risk Decision regarding hospitalization.         Consultants: I consulted with Hospitalist, Dr. Alfornia, who is appreciated for admitting.   Treatment and Plan: Patient's exam and diagnostic results are concerning for confusion, recurrent syncope, dysuria, hypotension.  Feel that patient will need admission to the hospital for further treatment and evaluation.    Final Clinical Impressions(s) / ED Diagnoses     ICD-10-CM   1. Confusion  R41.0     2. Orthostatic hypotension  I95.1     3. Abnormal urinalysis  R82.90     4. Dysuria  R30.0     5. Elevated serum creatinine  R79.89       ED Discharge Orders     None         Discharge Instructions Discussed with and Provided to Patient:   Discharge Instructions   None      Vicky Charleston, PA-C 08/14/24 0102    Melvenia Motto, MD 08/14/24 [phone]

## 2024-08-13 NOTE — ED Triage Notes (Signed)
 Pt was at orthopedic appointment and provider felt patient was very confused and had family pick patient up. Pt states he feels okay now but was dizzy earlier. Pt A&Ox4 on arrival, ambulatory to waiting area.

## 2024-08-13 NOTE — ED Triage Notes (Signed)
 Patient from ortho office where the doctor thought he was confused. States prior to this episode he took the new medication for the first time that he was prescribed yesterday. Wife states that currently patient is acting to his baseline which is slow thinking. States for one month has been this way but in the last two days has been more confused than normal.

## 2024-08-13 NOTE — ED Provider Triage Note (Signed)
 Emergency Medicine Provider Triage Evaluation Note  MASSON NALEPA , a 78 y.o. male  was evaluated in triage.  Pt complains of confusion, weakness.  Patient was initially seen yesterday in the emergency department for similar concerns and reportedly was better but wife reports that he is still somewhat confused compared to baseline.  She does report better than yesterday.  Reportedly was at an orthopedic appointment and wife received a call from the orthopedic office concerned that he was confused and needed to be picked up.  Patient reports that he feels well at this time but was slightly dizzy earlier..  Review of Systems  Positive: As above Negative: As above  Physical Exam  BP 102/65   Pulse 62   Temp (!) 97.4 F (36.3 C) (Oral)   Resp 17   SpO2 99%  Gen:   Awake, no distress   Resp:  Normal effort  MSK:   Moves extremities without difficulty  Other:  Not overtly confused on my exam as he is answering questions appropriately, but some delay in response.  Medical Decision Making  Medically screening exam initiated at 2:10 PM.  Appropriate orders placed.  Elsie FORBES Collier was informed that the remainder of the evaluation will be completed by another provider, this initial triage assessment does not replace that evaluation, and the importance of remaining in the ED until their evaluation is complete.     Romari Gasparro A, PA-C 08/14/24 2010

## 2024-08-14 ENCOUNTER — Observation Stay (HOSPITAL_BASED_OUTPATIENT_CLINIC_OR_DEPARTMENT_OTHER)

## 2024-08-14 ENCOUNTER — Encounter (HOSPITAL_COMMUNITY): Payer: Self-pay | Admitting: Internal Medicine

## 2024-08-14 DIAGNOSIS — R010 Benign and innocent cardiac murmurs: Secondary | ICD-10-CM

## 2024-08-14 DIAGNOSIS — R3914 Feeling of incomplete bladder emptying: Secondary | ICD-10-CM

## 2024-08-14 DIAGNOSIS — I25118 Atherosclerotic heart disease of native coronary artery with other forms of angina pectoris: Secondary | ICD-10-CM

## 2024-08-14 DIAGNOSIS — R001 Bradycardia, unspecified: Secondary | ICD-10-CM

## 2024-08-14 DIAGNOSIS — R300 Dysuria: Secondary | ICD-10-CM

## 2024-08-14 DIAGNOSIS — R55 Syncope and collapse: Secondary | ICD-10-CM

## 2024-08-14 DIAGNOSIS — N401 Enlarged prostate with lower urinary tract symptoms: Secondary | ICD-10-CM

## 2024-08-14 DIAGNOSIS — I10 Essential (primary) hypertension: Secondary | ICD-10-CM

## 2024-08-14 HISTORY — DX: Bradycardia, unspecified: R00.1

## 2024-08-14 HISTORY — DX: Dysuria: R30.0

## 2024-08-14 HISTORY — DX: Syncope and collapse: R55

## 2024-08-14 LAB — ECHOCARDIOGRAM COMPLETE
AV Mean grad: 11 mmHg
Area-P 1/2: 2.24 cm2
S' Lateral: 2.5 cm
Weight: 3151.7 [oz_av]

## 2024-08-14 LAB — CBG MONITORING, ED: Glucose-Capillary: 89 mg/dL (ref 70–99)

## 2024-08-14 LAB — GLUCOSE, CAPILLARY
Glucose-Capillary: 111 mg/dL — ABNORMAL HIGH (ref 70–99)
Glucose-Capillary: 132 mg/dL — ABNORMAL HIGH (ref 70–99)

## 2024-08-14 MED ORDER — INSULIN ASPART 100 UNIT/ML IJ SOLN: 0.0000 [IU] | Freq: Three times a day (TID) | INTRAMUSCULAR | Status: DC

## 2024-08-14 MED ORDER — ENOXAPARIN SODIUM 40 MG/0.4ML IJ SOSY
40.0000 mg | PREFILLED_SYRINGE | INTRAMUSCULAR | Status: DC
  Administered 2024-08-14 – 2024-08-15 (×2): 40 mg via SUBCUTANEOUS
  Filled 2024-08-14 (×2): qty 0.4

## 2024-08-14 MED ORDER — ASPIRIN 81 MG PO TBEC
81.0000 mg | DELAYED_RELEASE_TABLET | Freq: Every day | ORAL | Status: DC
  Administered 2024-08-14 – 2024-08-15 (×2): 81 mg via ORAL
  Filled 2024-08-14 (×2): qty 1

## 2024-08-14 MED ORDER — METOPROLOL SUCCINATE ER 25 MG PO TB24
12.5000 mg | ORAL_TABLET | Freq: Every day | ORAL | Status: DC
  Administered 2024-08-14: 12.5 mg via ORAL
  Filled 2024-08-14: qty 1

## 2024-08-14 MED ORDER — TAMSULOSIN HCL 0.4 MG PO CAPS
0.4000 mg | ORAL_CAPSULE | Freq: Every day | ORAL | Status: DC
Start: 2024-08-14 — End: 2024-08-14
  Administered 2024-08-14: 0.4 mg via ORAL
  Filled 2024-08-14: qty 1

## 2024-08-14 MED ORDER — ACETAMINOPHEN 650 MG RE SUPP: 650.0000 mg | Freq: Four times a day (QID) | RECTAL | Status: DC | PRN

## 2024-08-14 MED ORDER — AMLODIPINE BESYLATE 5 MG PO TABS
2.5000 mg | ORAL_TABLET | Freq: Every day | ORAL | Status: DC
Start: 2024-08-14 — End: 2024-08-15
  Administered 2024-08-14: 2.5 mg via ORAL
  Filled 2024-08-14: qty 1

## 2024-08-14 MED ORDER — ISOSORBIDE MONONITRATE ER 30 MG PO TB24
30.0000 mg | ORAL_TABLET | Freq: Every day | ORAL | Status: DC
  Administered 2024-08-14: 30 mg via ORAL
  Filled 2024-08-14: qty 1

## 2024-08-14 MED ORDER — SODIUM CHLORIDE 0.9 % IV SOLN
INTRAVENOUS | Status: DC
Start: 2024-08-14 — End: 2024-08-14

## 2024-08-14 MED ORDER — ACETAMINOPHEN 325 MG PO TABS
650.0000 mg | ORAL_TABLET | Freq: Four times a day (QID) | ORAL | Status: DC | PRN
  Administered 2024-08-14: 650 mg via ORAL
  Filled 2024-08-14: qty 2

## 2024-08-14 MED ORDER — INSULIN ASPART 100 UNIT/ML IJ SOLN: 0.0000 [IU] | Freq: Every day | INTRAMUSCULAR | Status: DC

## 2024-08-14 MED ORDER — INFLUENZA VAC SPLIT HIGH-DOSE 0.5 ML IM SUSY
0.5000 mL | PREFILLED_SYRINGE | INTRAMUSCULAR | Status: DC
  Filled 2024-08-14 (×2): qty 0.5

## 2024-08-14 MED ORDER — ALFUZOSIN HCL ER 10 MG PO TB24
10.0000 mg | ORAL_TABLET | Freq: Every day | ORAL | Status: DC
  Administered 2024-08-14: 10 mg via ORAL
  Filled 2024-08-14: qty 1

## 2024-08-14 MED ORDER — ATORVASTATIN CALCIUM 40 MG PO TABS
40.0000 mg | ORAL_TABLET | Freq: Every day | ORAL | Status: DC
  Administered 2024-08-14 – 2024-08-15 (×2): 40 mg via ORAL
  Filled 2024-08-14 (×2): qty 1

## 2024-08-14 NOTE — Assessment & Plan Note (Signed)
 08-14-2024 I really think that it is a combination of dose, timing and number of meds he is taking.  His LVEF is 60-65%. He has no hx of CHF. He had some elevated Scr on admission. These are all reasons to stop his ARB for now. Will reduce and re-time dosing of Toprol -XL to 12.5 mg at bedtime. Give his Imdur  30 mg q8am. Dtr states when pt is working his part time job, he is most active during the AM.  Will dose his norvasc  at 3 pm to give him some additional anginal coverage in the afternoon. Will monitor his BP and HR. Told family that it shouldn't take more than 48 hours to figure out the correct timing and dosage of his HTN meds.

## 2024-08-14 NOTE — Progress Notes (Signed)
  Echocardiogram 2D Echocardiogram has been performed.  Vincent Stevens 08/14/2024, 1:55 PM

## 2024-08-14 NOTE — Assessment & Plan Note (Signed)
 08-14-2024 likely related to too much HTN meds or not given at right time. Pt feeling better now.

## 2024-08-14 NOTE — H&P (Signed)
 History and Physical    Vincent Stevens FMW:[id number] DOB: Oct 08, 1946 DOA: 08/13/2024  PCP: Kennyth Worth HERO, MD  Patient coming from: Home  Chief Complaint: Syncope  HPI: Vincent Stevens is a 78 y.o. male with medical history significant of CAD status post CABG in 2019, type 2 diabetes, hypertension, hyperlipidemia, GERD, gout, melanoma, BPH presented to ED for evaluation of confusion, hypotension, and syncope.  Blood pressure dropped from 111/63 supine to 93/56 standing.  Bradycardia with heart rate as low as 40s.  Labs showing no leukocytosis or anemia, glucose 162, creatinine 1.27 (close to baseline), troponin negative x 2, COVID/influenza/RSV PCR negative.  UA with negative nitrite, small amount of leukocytes, and microscopy showing 6-10 WBCs and rare bacteria.  EKG showing sinus rhythm with marked sinus arrhythmia.  CT head showing no acute intracranial abnormality.  CT renal stone study showing bilateral nonobstructing renal calculi and no acute abnormality.  Patient was given ceftriaxone  and 1 L LR.  TR[facility] called to admit.  Patient is currently AAO x 4 and does not appear confused.  States yesterday he went to see his orthopedic doctor for his knees and while in the waiting room he started feeling dizzy and when they checked his blood pressure he was told that it was low.  States the day prior he was doing some yard work at home and started having substernal chest pain.  He then took a nitroglycerin  tablet and sat down to rest.  Soon after he passed out and was told by his wife that he had slumped over.  States he was evaluated in the emergency room at that time and was prescribed Imdur .  Patient thinks he felt dizzy yesterday as he is already on blood pressure medications and the new medication Imdur  dropped his blood pressure further.  No longer having any chest pain at this time.  Denies shortness of breath.  Reports chronic cough which he attributes to allergies/postnasal drip.  He reports  getting up multiple times at night to go urinate and sensation of incomplete bladder emptying which is chronic.  Does take Flomax  at home.  For the past few days he has had mild burning with urination.  Denies nausea, vomiting, or abdominal pain.  Review of Systems:  Review of Systems  All other systems reviewed and are negative.   Past Medical History:  Diagnosis Date   Allergy    Angina pectoris (HCC) 11/02/2023   Angina, class III (HCC) 03/01/2018   BPH (benign prostatic hyperplasia) 09/19/2009   Qualifier: Diagnosis of  By: Jame  MD, Maude FALCON    Chest pain of uncertain etiology 10/20/2023   Coronary artery disease involving native coronary artery of native heart with angina pectoris (HCC)    Diabetes mellitus due to underlying condition with unspecified complications (HCC) 10/20/2023   Dyslipidemia 08/14/2010   Qualifier: Diagnosis of  By: Jame  MD, Maude FALCON    Essential hypertension 01/17/2009   Qualifier: Diagnosis of  By: Jame  MD, Maude FALCON    GERD (gastroesophageal reflux disease) 09/09/2022   Gout    History of colonic polyps 09/27/2008   Qualifier: Diagnosis of  By: Jame  MD, Maude FALCON  Initial colonoscopy 2003.  Hyperplastic polyps only Followup colonoscopy 2008.  Normal  10 year interval recommended    Hx of CABG 03/01/2018   Left main coronary artery disease    Melanoma (HCC)    Pain in both lower extremities 08/15/2018   Prediabetes 09/13/2022    Past  Surgical History:  Procedure Laterality Date   CORONARY ARTERY BYPASS GRAFT N/A 03/01/2018   Procedure: CORONARY ARTERY BYPASS GRAFTING (CABG) times three using the right saphaneous vein. Harvested endoscopicly; and left internal mammary artery.;  Surgeon: Lucas Dorise POUR, MD;  Location: [facility] OR;  Service: Open Heart Surgery;  Laterality: N/A;   HERNIA REPAIR     LEFT HEART CATH AND CORONARY ANGIOGRAPHY N/A 03/01/2018   Procedure: LEFT HEART CATH AND CORONARY ANGIOGRAPHY;  Surgeon: Anner Alm ORN,  MD;  Location: [facility] INVASIVE CV LAB;  Service: Cardiovascular;  Laterality: N/A;   LEFT HEART CATH AND CORS/GRAFTS ANGIOGRAPHY N/A 11/04/2023   Procedure: LEFT HEART CATH AND CORS/GRAFTS ANGIOGRAPHY;  Surgeon: Ladona Heinz, MD;  Location: [facility] INVASIVE CV LAB;  Service: Cardiovascular;  Laterality: N/A;   melanoma removal     TEE WITHOUT CARDIOVERSION N/A 03/01/2018   Procedure: TRANSESOPHAGEAL ECHOCARDIOGRAM (TEE);  Surgeon: Lucas Dorise POUR, MD;  Location: [facility] OR;  Service: Open Heart Surgery;  Laterality: N/A;     reports that he quit smoking about 43 years ago. His smoking use included cigarettes. He has never used smokeless tobacco. He reports that he does not drink alcohol and does not use drugs.  Allergies  Allergen Reactions   Sulfamethoxazole Hives and Itching    Family History  Problem Relation Age of Onset   Dementia Mother    Heart attack Father    Cancer Sister        Skin   Diabetes Other    Hypertension Other     Prior to Admission medications   Medication Sig Start Date End Date Taking? Authorizing Provider  amLODipine  (NORVASC ) 2.5 MG tablet Take 1 tablet (2.5 mg total) by mouth at bedtime. 08/12/24  Yes Lang Norleen POUR, PA-C  aspirin  EC 81 MG tablet Take 1 tablet (81 mg total) by mouth daily. 08/18/18  Yes Monetta Redell PARAS, MD  ibuprofen  (ADVIL ,MOTRIN ) 200 MG tablet Take 200 mg by mouth 2 (two) times daily as needed (arthritis apin).    Yes [provider]  isosorbide  mononitrate (IMDUR ) 30 MG 24 hr tablet Take 1 tablet (30 mg total) by mouth daily. 08/12/24  Yes Robinson, John K, PA-C  losartan  (COZAAR ) 50 MG tablet Take 1 tablet (50 mg total) by mouth at bedtime. Patient needs appointment for further refills. 1 st attempt 08/12/24  Yes Robinson, John K, PA-C  metFORMIN  (GLUCOPHAGE ) 500 MG tablet Take 500 mg by mouth daily with breakfast. 11/06/23  Yes Ladona Heinz, MD  metoprolol  succinate (TOPROL -XL) 25 MG 24 hr tablet TAKE 1 TABLET BY MOUTH DAILY. TAKE WITH OR  IMMEDIATELY FOLLOWING A MEAL. 11/28/23  Yes Ladona Heinz, MD  Multiple Vitamin (MULTIVITAMIN WITH MINERALS) TABS tablet Take 1 tablet by mouth at bedtime.   Yes [provider]  nitroGLYCERIN  (NITROSTAT ) 0.4 MG SL tablet Place 0.4 mg under the tongue every 5 (five) minutes as needed for chest pain.   Yes [provider]  simvastatin  (ZOCOR ) 80 MG tablet Take 1 tablet (80 mg total) by mouth daily. Patient needs an appointment for further refills. 3 rd/final attempt 02/21/24  Yes Kennyth Worth HERO, MD  tamsulosin  (FLOMAX ) 0.4 MG CAPS capsule Take 1 capsule (0.4 mg total) by mouth daily. 07/05/24  Yes Kennyth Worth HERO, MD  triamcinolone  ointment (KENALOG ) 0.5 % APPLY 1 APPLICATION TOPICALLY 2 (TWO) TIMES DAILY. Patient not taking: Reported on 04/10/2024 03/18/23   Kennyth Worth HERO, MD    Physical Exam: Vitals:   08/14/24 0215 08/14/24  0243 08/14/24 0345 08/14/24 0500  BP: 100/65  121/64 122/69  Pulse: (!) 52  (!) 51 (!) 49  Resp:   (!) 22 (!) 21  Temp:  98.5 F (36.9 C)    TempSrc:  Oral    SpO2: 100%  100% 100%    Physical Exam Vitals reviewed.  Constitutional:      General: He is not in acute distress. HENT:     Head: Normocephalic and atraumatic.  Eyes:     Extraocular Movements: Extraocular movements intact.  Cardiovascular:     Rate and Rhythm: Normal rate and regular rhythm.     Heart sounds: Murmur heard.  Pulmonary:     Effort: Pulmonary effort is normal. No respiratory distress.     Breath sounds: Normal breath sounds. No stridor. No wheezing, rhonchi or rales.  Abdominal:     General: Bowel sounds are normal. There is no distension.     Palpations: Abdomen is soft.     Tenderness: There is no abdominal tenderness. There is no guarding.  Musculoskeletal:     Cervical back: Normal range of motion.     Right lower leg: No edema.     Left lower leg: No edema.  Skin:    General: Skin is warm and dry.  Neurological:     General: No focal deficit present.      Mental Status: He is alert and oriented to person, place, and time.     Cranial Nerves: No cranial nerve deficit.     Sensory: No sensory deficit.     Motor: No weakness.     Labs on Admission: I have personally reviewed following labs and imaging studies  CBC: Recent Labs  Lab 08/12/24 1041 08/13/24 1412  WBC 9.2 9.6  NEUTROABS  --  7.3  HGB 15.3 15.1  HCT 46.8 47.2  MCV 90.7 91.8  PLT 206 208   Basic Metabolic Panel: Recent Labs  Lab 08/12/24 1041 08/13/24 1412  NA 133* 136  K 4.6 4.8  CL 105 103  CO2 20* 22  GLUCOSE 142* 162*  BUN 26* 28*  CREATININE 1.14 1.27*  CALCIUM  8.8* 9.3   GFR: CrCl cannot be calculated (Unknown ideal weight.). Liver Function Tests: Recent Labs  Lab 08/13/24 1412  AST 26  ALT 28  ALKPHOS 41  BILITOT 1.1  PROT 6.3*  ALBUMIN  3.8   No results for input(s): LIPASE, AMYLASE in the last 168 hours. No results for input(s): AMMONIA in the last 168 hours. Coagulation Profile: No results for input(s): INR, PROTIME in the last 168 hours. Cardiac Enzymes: No results for input(s): CKTOTAL, CKMB, CKMBINDEX, TROPONINI in the last 168 hours. BNP (last 3 results) No results for input(s): PROBNP in the last 8760 hours. HbA1C: No results for input(s): HGBA1C in the last 72 hours. CBG: Recent Labs  Lab 08/12/24 1034 08/13/24 1431  GLUCAP 144* 126*   Lipid Profile: No results for input(s): CHOL, HDL, LDLCALC, TRIG, CHOLHDL, LDLDIRECT in the last 72 hours. Thyroid  Function Tests: No results for input(s): TSH, T4TOTAL, FREET4, T3FREE, THYROIDAB in the last 72 hours. Anemia Panel: No results for input(s): VITAMINB12, FOLATE, FERRITIN, TIBC, IRON, RETICCTPCT in the last 72 hours. Urine analysis:    Component Value Date/Time   COLORURINE YELLOW 08/13/2024 2114   APPEARANCEUR CLEAR 08/13/2024 2114   LABSPEC 1.014 08/13/2024 2114   PHURINE 5.0 08/13/2024 2114   GLUCOSEU NEGATIVE  08/13/2024 2114   HGBUR NEGATIVE 08/13/2024 2114   HGBUR negative 02/06/2010 9171  BILIRUBINUR NEGATIVE 08/13/2024 2114   BILIRUBINUR n 06/16/2016 0856   KETONESUR NEGATIVE 08/13/2024 2114   PROTEINUR NEGATIVE 08/13/2024 2114   UROBILINOGEN 0.2 06/16/2016 0856   UROBILINOGEN 0.2 02/06/2010 0828   NITRITE NEGATIVE 08/13/2024 2114   LEUKOCYTESUR SMALL (A) 08/13/2024 2114    Radiological Exams on Admission: CT Renal Stone Study Result Date: 08/13/2024 CLINICAL DATA:  Flank pain EXAM: CT ABDOMEN AND PELVIS WITHOUT CONTRAST TECHNIQUE: Multidetector CT imaging of the abdomen and pelvis was performed following the standard protocol without IV contrast. RADIATION DOSE REDUCTION: This exam was performed according to the departmental dose-optimization program which includes automated exposure control, adjustment of the mA and/or kV according to patient size and/or use of iterative reconstruction technique. COMPARISON:  None Available. FINDINGS: Lower chest: Lung bases demonstrate mild subpleural fibrotic changes worse on the right than the left. No focal infiltrate is seen. Hepatobiliary: No focal liver abnormality is seen. No gallstones, gallbladder wall thickening, or biliary dilatation. Pancreas: Unremarkable. No pancreatic ductal dilatation or surrounding inflammatory changes. Spleen: Normal in size without focal abnormality. Adrenals/Urinary Tract: Adrenal glands are within normal limits. Kidneys demonstrate nonobstructing stones bilaterally measuring 2 mm or smaller. The ureters are within normal limits. The bladder is well distended. Stomach/Bowel: The appendix is within normal limits. No obstructive or inflammatory changes of the colon are seen. Diverticular change is noted without evidence of diverticulitis. The small bowel and stomach are within normal limits. Vascular/Lymphatic: Aortic atherosclerosis. No enlarged abdominal or pelvic lymph nodes. Reproductive: Prostate is unremarkable. Other: No  abdominal wall hernia or abnormality. No abdominopelvic ascites. Musculoskeletal: No acute or significant osseous findings. IMPRESSION: Bilateral nonobstructing renal calculi. No acute abnormality is noted. Diverticulosis without diverticulitis. Electronically Signed   By: Oneil Devonshire M.D.   On: 08/13/2024 23:14   CT HEAD WO CONTRAST ( ) Result Date: 08/13/2024 CLINICAL DATA:  Altered mental status EXAM: CT HEAD WITHOUT CONTRAST TECHNIQUE: Contiguous axial images were obtained from the base of the skull through the vertex without intravenous contrast. RADIATION DOSE REDUCTION: This exam was performed according to the departmental dose-optimization program which includes automated exposure control, adjustment of the mA and/or kV according to patient size and/or use of iterative reconstruction technique. COMPARISON:  None Available. FINDINGS: Brain: No evidence of acute infarction, hemorrhage, hydrocephalus, extra-axial collection or mass lesion/mass effect. Vascular: No hyperdense vessel or unexpected calcification. Skull: Normal. Negative for fracture or focal lesion. Sinuses/Orbits: No acute finding. Other: None. IMPRESSION: No acute intracranial abnormality noted. Electronically Signed   By: Oneil Devonshire M.D.   On: 08/13/2024 23:12   DG Chest 2 View Result Date: 08/12/2024 EXAM: 2 VIEW(S) XRAY OF THE CHEST 08/12/2024 10:59:00 AM COMPARISON: 07/23/2024 CLINICAL HISTORY: Chest pain. Ordered for chest pain. He notes h/o cardiac bypass and takes nitroglycerin . Per triage notes: Pt states he went out side and when he came back in he sat down due to developing chest pain, he decided to take 1 nitroglycerin  and the next thing he remembers is waking up to his family around him shaking him. Family states he was dosing off in the car on the way here. FINDINGS: LUNGS AND PLEURA: No focal pulmonary opacity. No pulmonary edema. No pleural effusion. No pneumothorax. HEART AND MEDIASTINUM: CABG markers noted. Sternotomy  wires noted. No acute abnormality of the cardiac and mediastinal silhouettes. BONES AND SOFT TISSUES: Multiple level vertebral endplate spurring in lower thoracic spine. No acute osseous abnormality. IMPRESSION: 1. No acute process. Electronically signed by: Waddell Calk MD 08/12/2024 11:16 AM EDT  RP Workstation: [id number]    Assessment and Plan  Dizziness/recent syncope Orthostatic hypotension likely contributing as his blood pressure dropped from 111/63 supine to 93/56 standing.  He is on multiple antihypertensives at home including amlodipine , losartan , and metoprolol .  Seen in the ED 2 days ago for chest pain and started on Imdur  for antianginal treatment.  Workup at present not suggestive of ACS.  PE less likely given no tachycardia or hypoxia.  He is satting 100% on room air and denies any chest pain or shortness of breath at present.  Reportedly family was concerned about patient being confused but currently he is AAOx4 and answering questions appropriately.  CT head showing no acute intracranial abnormality and no focal neurodeficit on exam.  He is bradycardic with heart rate as low as 40s in the ED but currently in the 50s to low 60s. -Cardiac monitoring -Hold metoprolol , Imdur , and other antihypertensive medications at this time -IV fluid hydration and repeat orthostatics in the morning -He does have a murmur on exam and last echo was 5 years ago, as such, repeat echocardiogram ordered.  Mild dysuria Patient was given a dose of ceftriaxone  in the ED but UA not strongly suggestive of UTI.  No fever or leukocytosis.  No obstructing renal calculi seen on CT.  Will add on urine culture.  Sinus bradycardia Continue cardiac monitoring and holding metoprolol  as above.  Monitor blood pressure closely.  CAD status post CABG in 2019 Workup not suggestive of ACS.  Holding metoprolol  due to bradycardia and orthostatic hypotension.  Continue aspirin  and statin.  Type 2 diabetes A1c 5.9 in April  2025.  Glucose in the 160s.  Placed on sensitive sliding scale insulin  ACHS.  Hold oral hypoglycemic agents at this time.  Hyperlipidemia Continue statin.  BPH Continue Flomax .  DVT prophylaxis: Lovenox  Code Status: Full Code (discussed with the patient) Family Communication: No family at bedside.  Diagnostic findings and treatment plan discussed with the patient. Level of care: Progressive Care Unit Admission status: It is my clinical opinion that referral for OBSERVATION is reasonable and necessary in this patient based on the above information provided. The aforementioned taken together are felt to place the patient at high risk for further clinical deterioration. However, it is anticipated that the patient may be medically stable for discharge from the hospital within 24 to 48 hours.  Editha Ram MD [facility]  If 7PM-7AM, please contact night-coverage [email or web address]  08/14/2024, 5:49 AM

## 2024-08-14 NOTE — Assessment & Plan Note (Signed)
 08-14-2024 had recent ED visit where norvasc  2.5 mg was restarted and Imdur  30 mg qam was added to his regimen. I think BP lowering effect of these meds in combination with toprol -XL, cozaar , flomax  probably the reason for his near syncope.

## 2024-08-14 NOTE — Subjective & Objective (Addendum)
 Pt seen and examined. His HR was in the 40s most of the morning. Starting to rise in the afternoon to the 72s. By the time I saw him at 5 pm today, his HR was 67-70s on telemetry. 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  Pt had near syncopal episode while at orthopedic surgery office. He has been getting cortisone injections into his knees for knee pain.  Has had multiple ER visits over the last 3 weeks for BP and CP related issues. In August, pt had multiple bees stings and went to ER. Had his norvasc  stopped.  Went to ER last week for CP. Had his norvasc  restarted. Imdur  30 mg added and his losartan  frequency reduced to just once a day(had been BID).SABRA pt has been feeling quite sluggish and tired.  He notices when his HR is below 50 at home, he feels very tired and fatigued.  He still is working part time as a [employer] administrator. Mostly sedentary at home but does yard work occasionally.

## 2024-08-14 NOTE — Progress Notes (Signed)
 PROGRESS NOTE    Vincent Stevens  FMW:[id number] DOB: 08-06-1946 DOA: 08/13/2024 PCP: Kennyth Worth HERO, MD  Subjective: Pt seen and examined. His HR was in the 40s most of the morning. Starting to rise in the afternoon to the 79s. By the time I saw him at 5 pm today, his HR was 67-70s on telemetry. Met with pt and his dtr Quarry manager with [facility] police department.  Pt had near syncopal episode while at orthopedic surgery office. He has been getting cortisone injections into his knees for knee pain.  Has had multiple ER visits over the last 3 weeks for BP and CP related issues. In August, pt had multiple bees stings and went to ER. Had his norvasc  stopped.  Went to ER last week for CP. Had his norvasc  restarted. Imdur  30 mg added and his losartan  frequency reduced to just once a day(had been BID).SABRA pt has been feeling quite sluggish and tired.  He notices when his HR is below 50 at home, he feels very tired and fatigued.  He still is working part time as a [employer] administrator. Mostly sedentary at home but does yard work occasionally.     Hospital Course: CC: syncope HPI: Vincent Stevens is a 78 y.o. male with medical history significant of CAD status post CABG in 2019, type 2 diabetes, hypertension, hyperlipidemia, GERD, gout, melanoma, BPH presented to ED for evaluation of confusion, hypotension, and syncope.  Blood pressure dropped from 111/63 supine to 93/56 standing.  Bradycardia with heart rate as low as 40s.  Labs showing no leukocytosis or anemia, glucose 162, creatinine 1.27 (close to baseline), troponin negative x 2, COVID/influenza/RSV PCR negative.  UA with negative nitrite, small amount of leukocytes, and microscopy showing 6-10 WBCs and rare bacteria.  EKG showing sinus rhythm with marked sinus arrhythmia.  CT head showing no acute intracranial abnormality.  CT renal stone study showing bilateral nonobstructing renal calculi and no acute abnormality.  Patient was given  ceftriaxone  and 1 L LR.  TR[facility] called to admit.   Patient is currently AAO x 4 and does not appear confused.  States yesterday he went to see his orthopedic doctor for his knees and while in the waiting room he started feeling dizzy and when they checked his blood pressure he was told that it was low.  States the day prior he was doing some yard work at home and started having substernal chest pain.  He then took a nitroglycerin  tablet and sat down to rest.  Soon after he passed out and was told by his wife that he had slumped over.  States he was evaluated in the emergency room at that time and was prescribed Imdur .  Patient thinks he felt dizzy yesterday as he is already on blood pressure medications and the new medication Imdur  dropped his blood pressure further.  No longer having any chest pain at this time.  Denies shortness of breath.  Reports chronic cough which he attributes to allergies/postnasal drip.  He reports getting up multiple times at night to go urinate and sensation of incomplete bladder emptying which is chronic.  Does take Flomax  at home.  For the past few days he has had mild burning with urination.  Denies nausea, vomiting, or abdominal pain.  Significant Events: Admitted 08/13/2024 for syncope   Admission Labs: WBC 9.6, HgB 15.1, plt 208 Na 136, K 4.8, CO2 of 22, BUN 28, scr 1.27, glu 162 T prot 6.3, alb 3.8, AST 26, ALT  28, alk phos 28, t. Bili 1.1 UA small LE, WBC 6-10, rate bacteria Covid/rsv/flu negative  Admission Imaging Studies: CT head No acute intracranial abnormality noted  CT renal stone Bilateral nonobstructing renal calculi. No acute abnormality is noted. Diverticulosis without diverticulitis  Significant Labs:   Significant Imaging Studies: Echo LVEF 60-65%. Normal diastolic parameters.  Antibiotic Therapy: Anti-infectives (From admission, onward)    Start     Dose/Rate Route Frequency Ordered Stop   08/13/24 2245  cefTRIAXone  (ROCEPHIN ) 1 g in sodium  chloride 0.9 % 100 mL IVPB        1 g 200 mL/hr over 30 Minutes Intravenous  Once 08/13/24 2234 08/13/24 2346       Procedures:   Consultants:     Assessment and Plan: * Near syncope 08-14-2024 likely related to too much HTN meds or not given at right time. Pt feeling better now.  Angina, class III (HCC) 08-14-2024 had recent ED visit where norvasc  2.5 mg was restarted and Imdur  30 mg qam was added to his regimen. I think BP lowering effect of these meds in combination with toprol -XL, cozaar , flomax  probably the reason for his near syncope.  Sinus bradycardia 08-14-2024 related to betablocker dose. On Toprol -xl 25 mg daily. HR were in the 40s early this AM. By 6 pm this evening, HR in the 70s. Has been off Toprol -XL for about 24 hours. Will need to reduce dose of stop it all together.  Given his angina with exertion, will need some chronotropic control. Will reduce dose and give it to him at bedtime.  Atherosclerosis of native coronary artery of native heart with stable angina pectoris (HCC) 08-14-2024 need to switch around dosing of his anti-anginal meds. If he is still having angina even with changing dosing and timing, may need to add Ranexa to his regimen.  Essential hypertension 08-14-2024 I really think that it is a combination of dose, timing and number of meds he is taking.  His LVEF is 60-65%. He has no hx of CHF. He had some elevated Scr on admission. These are all reasons to stop his ARB for now. Will reduce and re-time dosing of Toprol -XL to 12.5 mg at bedtime. Give his Imdur  30 mg q8am. Dtr states when pt is working his part time job, he is most active during the AM.  Will dose his norvasc  at 3 pm to give him some additional anginal coverage in the afternoon. Will monitor his BP and HR. Told family that it shouldn't take more than 48 hours to figure out the correct timing and dosage of his HTN meds.  Dysuria 08-14-2024 doubt he has UTI. More likely undertreated BPH  symptoms.  BPH (benign prostatic hyperplasia) 08-14-2024 clearly pt's flomax  is not working. He states he gets up every 1 hour during night time to urinate. Has to stand up and wait for about 5 mins before urinary stream starts and has to bear down in order to force out urine. Pt states he has never seen an urologist. Will refer him to outpatient urology. Stop flomax . Start Uroxatral . Add proscar . Check PSA.  Dyslipidemia 08-14-2024 continue lipitor 40 mg qday  Diabetes mellitus due to underlying condition with unspecified complications (HCC) 08-14-2024 check A1c. On SSI.   DVT prophylaxis: enoxaparin  (LOVENOX ) injection 40 mg Start: 08/14/24 1000    Code Status: Full Code Family Communication: discussed with pt and dtr candy at bedside Disposition Plan: return home Reason for continuing need for hospitalization: medication adjustments to prevent bradycardia and hypotension.  May take 24-48 hours to adjust meds appropriately.  Objective: Vitals:   08/14/24 1615 08/14/24 1645 08/14/24 1700 08/14/24 1845  BP: 99/63 (!) 160/68    Pulse: (!) 55     Resp: 18  18   Temp:   (!) 97.5 F (36.4 C)   TempSrc:   Oral   SpO2: 100%  100%   Weight:      Height:    5' 9 (1.753 m)    Intake/Output Summary (Last 24 hours) at 08/14/2024 1906 Last data filed at 08/14/2024 0026 Gross per 24 hour  Intake 1100 ml  Output --  Net 1100 ml   Filed Weights   08/14/24 0629  Weight: 89.4 kg   Examination:  Physical Exam Vitals and nursing note reviewed.  Constitutional:      General: He is not in acute distress.    Appearance: He is not toxic-appearing.  HENT:     Head: Normocephalic and atraumatic.  Eyes:     General: No scleral icterus. Cardiovascular:     Rate and Rhythm: Normal rate and regular rhythm.  Pulmonary:     Effort: Pulmonary effort is normal.     Breath sounds: Normal breath sounds.  Abdominal:     General: Bowel sounds are normal. There is no distension.     Palpations:  Abdomen is soft.  Musculoskeletal:     Right lower leg: No edema.     Left lower leg: No edema.  Skin:    General: Skin is warm and dry.     Capillary Refill: Capillary refill takes less than 2 seconds.  Neurological:     General: No focal deficit present.     Mental Status: He is alert and oriented to person, place, and time.     Data Reviewed: I have personally reviewed following labs and imaging studies  CBC: Recent Labs  Lab 08/12/24 1041 08/13/24 1412  WBC 9.2 9.6  NEUTROABS  --  7.3  HGB 15.3 15.1  HCT 46.8 47.2  MCV 90.7 91.8  PLT 206 208   Basic Metabolic Panel: Recent Labs  Lab 08/12/24 1041 08/13/24 1412  NA 133* 136  K 4.6 4.8  CL 105 103  CO2 20* 22  GLUCOSE 142* 162*  BUN 26* 28*  CREATININE 1.14 1.27*  CALCIUM  8.8* 9.3   GFR: Estimated Creatinine Clearance: 52.2 mL/min (A) (by C-G formula based on SCr of 1.27 mg/dL (H)). Liver Function Tests: Recent Labs  Lab 08/13/24 1412  AST 26  ALT 28  ALKPHOS 41  BILITOT 1.1  PROT 6.3*  ALBUMIN  3.8   CBG: Recent Labs  Lab 08/12/24 1034 08/13/24 1431 08/14/24 0824 08/14/24 1654  GLUCAP 144* 126* 89 111*    Recent Results (from the past 240 hours)  Resp panel by RT-PCR (RSV, Flu A&B, Covid) Anterior Nasal Swab     Status: None   Collection Time: 08/13/24 10:34 PM   Specimen: Anterior Nasal Swab  Result Value Ref Range Status   SARS Coronavirus 2 by RT PCR NEGATIVE NEGATIVE Final   Influenza A by PCR NEGATIVE NEGATIVE Final   Influenza B by PCR NEGATIVE NEGATIVE Final    Comment: (NOTE) The Xpert Xpress SARS-CoV-2/FLU/RSV plus assay is intended as an aid in the diagnosis of influenza from Nasopharyngeal swab specimens and should not be used as a sole basis for treatment. Nasal washings and aspirates are unacceptable for Xpert Xpress SARS-CoV-2/FLU/RSV testing.  Fact Sheet for Patients: BloggerCourse.com  Fact Sheet for Healthcare  Providers: SeriousBroker.it  This test is not yet approved or cleared by the [place]  FDA and has been authorized for detection and/or diagnosis of SARS-CoV-2 by FDA under an Emergency Use Authorization (EUA). This EUA will remain in effect (meaning this test can be used) for the duration of the COVID-19 declaration under Section 564(b)(1) of the Act, 21 U.S.C. section 360bbb-3(b)(1), unless the authorization is terminated or revoked.     Resp Syncytial Virus by PCR NEGATIVE NEGATIVE Final    Comment: (NOTE) Fact Sheet for Patients: BloggerCourse.com  Fact Sheet for Healthcare Providers: SeriousBroker.it  This test is not yet approved or cleared by the [place]  FDA and has been authorized for detection and/or diagnosis of SARS-CoV-2 by FDA under an Emergency Use Authorization (EUA). This EUA will remain in effect (meaning this test can be used) for the duration of the COVID-19 declaration under Section 564(b)(1) of the Act, 21 U.S.C. section 360bbb-3(b)(1), unless the authorization is terminated or revoked.  Performed at [facility], [address], [place], [place] [address]      Radiology Studies: ECHOCARDIOGRAM COMPLETE Result Date: 08/14/2024    ECHOCARDIOGRAM REPORT   Patient Name:   Vincent Stevens Date of Exam: 08/14/2024 Medical Rec #:  [id number]        Height:       70.0 in Accession #:    [id number]       Weight:       197.0 lb Date of Birth:  04/19/1946         BSA:          2.074 m Patient Age:    78 years         BP:           109/63 mmHg Patient Gender: M                HR:           66 bpm. Exam Location:  Inpatient Procedure: 2D Echo (Both Spectral and Color Flow Doppler were utilized during            procedure). Indications:    murmur  History:        Patient has prior history of Echocardiogram examinations, most                 recent 05/23/2019. Prior CABG and Abnormal ECG;  Risk                 Factors:Hypertension, Dyslipidemia and Diabetes.  Sonographer:    Tinnie Barefoot RDCS Referring Phys: [id number] VASUNDHRA RATHORE IMPRESSIONS  1. Left ventricular ejection fraction, by estimation, is 60 to 65%. The left ventricle has normal function. The left ventricle has no regional wall motion abnormalities. Left ventricular diastolic parameters were normal.  2. Right ventricular systolic function is normal. The right ventricular size is normal. There is normal pulmonary artery systolic pressure.  3. The mitral valve is normal in structure. No evidence of mitral valve regurgitation.  4. The aortic valve is grossly normal. Aortic valve regurgitation is not visualized. Aortic valve mean gradient measures 11.0 mmHg. FINDINGS  Left Ventricle: Left ventricular ejection fraction, by estimation, is 60 to 65%. The left ventricle has normal function. The left ventricle has no regional wall motion abnormalities. The left ventricular internal cavity size was normal in size. There is  no left ventricular hypertrophy. Left ventricular diastolic parameters were normal. Right Ventricle: The right ventricular size is normal. No increase in right  ventricular wall thickness. Right ventricular systolic function is normal. There is normal pulmonary artery systolic pressure. The tricuspid regurgitant velocity is 2.66 m/s, and  with an assumed right atrial pressure of 3 mmHg, the estimated right ventricular systolic pressure is 31.3 mmHg. Left Atrium: Left atrial size was normal in size. Right Atrium: Right atrial size was normal in size. Pericardium: There is no evidence of pericardial effusion. Mitral Valve: The mitral valve is normal in structure. No evidence of mitral valve regurgitation. Tricuspid Valve: The tricuspid valve is normal in structure. Tricuspid valve regurgitation is mild. Aortic Valve: The aortic valve is grossly normal. There is mild aortic valve annular calcification. Aortic valve  regurgitation is not visualized. Aortic valve mean gradient measures 11.0 mmHg. Pulmonic Valve: The pulmonic valve was normal in structure. Pulmonic valve regurgitation is not visualized. Aorta: The aortic root and ascending aorta are structurally normal, with no evidence of dilitation. IAS/Shunts: The interatrial septum was not well visualized.  LEFT VENTRICLE PLAX 2D LVIDd:         4.30 cm   Diastology LVIDs:         2.50 cm   LV e' medial:    7.29 cm/s LV PW:         1.00 cm   LV E/e' medial:  12.6 LV IVS:        1.20 cm   LV e' lateral:   12.20 cm/s LVOT diam:     2.00 cm   LV E/e' lateral: 7.5 LV SV:         86 LV SV Index:   41 LVOT Area:     3.14 cm  RIGHT VENTRICLE             IVC RV Basal diam:  2.70 cm     IVC diam: 1.50 cm RV S prime:     12.00 cm/s TAPSE (M-mode): 1.6 cm LEFT ATRIUM             Index        RIGHT ATRIUM           Index LA diam:        3.20 cm 1.54 cm/m   RA Area:     15.70 cm LA Vol (A2C):   53.6 ml 25.85 ml/m  RA Volume:   39.30 ml  18.95 ml/m LA Vol (A4C):   57.8 ml 27.87 ml/m LA Biplane Vol: 60.3 ml 29.08 ml/m  AORTIC VALVE AV Mean Grad: 11.0 mmHg LVOT Vmax:    117.50 cm/s LVOT Vmean:   78.800 cm/s LVOT VTI:     0.272 m  AORTA Ao Root diam: 3.10 cm Ao Asc diam:  3.60 cm MITRAL VALVE               TRICUSPID VALVE MV Area (PHT): 2.24 cm    TR Peak grad:   28.3 mmHg MV Decel Time: 338 msec    TR Vmax:        266.00 cm/s MV E velocity: 92.00 cm/s MV A velocity: 97.40 cm/s  SHUNTS MV E/A ratio:  0.94        Systemic VTI:  0.27 m                            Systemic Diam: 2.00 cm Aditya Sabharwal Electronically signed by Ria Commander Signature Date/Time: 08/14/2024/2:08:44 PM    Final    CT Renal Stone Study Result Date: 08/13/2024 CLINICAL DATA:  Flank pain EXAM: CT ABDOMEN AND PELVIS WITHOUT CONTRAST TECHNIQUE: Multidetector CT imaging of the abdomen and pelvis was performed following the standard protocol without IV contrast. RADIATION DOSE REDUCTION: This exam was performed  according to the departmental dose-optimization program which includes automated exposure control, adjustment of the mA and/or kV according to patient size and/or use of iterative reconstruction technique. COMPARISON:  None Available. FINDINGS: Lower chest: Lung bases demonstrate mild subpleural fibrotic changes worse on the right than the left. No focal infiltrate is seen. Hepatobiliary: No focal liver abnormality is seen. No gallstones, gallbladder wall thickening, or biliary dilatation. Pancreas: Unremarkable. No pancreatic ductal dilatation or surrounding inflammatory changes. Spleen: Normal in size without focal abnormality. Adrenals/Urinary Tract: Adrenal glands are within normal limits. Kidneys demonstrate nonobstructing stones bilaterally measuring 2 mm or smaller. The ureters are within normal limits. The bladder is well distended. Stomach/Bowel: The appendix is within normal limits. No obstructive or inflammatory changes of the colon are seen. Diverticular change is noted without evidence of diverticulitis. The small bowel and stomach are within normal limits. Vascular/Lymphatic: Aortic atherosclerosis. No enlarged abdominal or pelvic lymph nodes. Reproductive: Prostate is unremarkable. Other: No abdominal wall hernia or abnormality. No abdominopelvic ascites. Musculoskeletal: No acute or significant osseous findings. IMPRESSION: Bilateral nonobstructing renal calculi. No acute abnormality is noted. Diverticulosis without diverticulitis. Electronically Signed   By: Oneil Devonshire M.D.   On: 08/13/2024 23:14   CT HEAD WO CONTRAST ( ) Result Date: 08/13/2024 CLINICAL DATA:  Altered mental status EXAM: CT HEAD WITHOUT CONTRAST TECHNIQUE: Contiguous axial images were obtained from the base of the skull through the vertex without intravenous contrast. RADIATION DOSE REDUCTION: This exam was performed according to the departmental dose-optimization program which includes automated exposure control, adjustment  of the mA and/or kV according to patient size and/or use of iterative reconstruction technique. COMPARISON:  None Available. FINDINGS: Brain: No evidence of acute infarction, hemorrhage, hydrocephalus, extra-axial collection or mass lesion/mass effect. Vascular: No hyperdense vessel or unexpected calcification. Skull: Normal. Negative for fracture or focal lesion. Sinuses/Orbits: No acute finding. Other: None. IMPRESSION: No acute intracranial abnormality noted. Electronically Signed   By: Oneil Devonshire M.D.   On: 08/13/2024 23:12    Scheduled Meds:  alfuzosin   10 mg Oral QHS   amLODipine   2.5 mg Oral QHS   aspirin  EC  81 mg Oral Daily   atorvastatin   40 mg Oral Daily   enoxaparin  (LOVENOX ) injection  40 mg Subcutaneous Q24H   [START ON 08/15/2024] Influenza vac split trivalent PF  0.5 mL Intramuscular Tomorrow-1000   insulin  aspart  0-5 Units Subcutaneous QHS   insulin  aspart  0-9 Units Subcutaneous TID WC   isosorbide  mononitrate  30 mg Oral Q1400   metoprolol  succinate  12.5 mg Oral QHS   Continuous Infusions:   LOS: 0 days   Time spent: 55 minutes  Camellia Door, DO  [facility]  08/14/2024, 7:06 PM

## 2024-08-14 NOTE — Assessment & Plan Note (Signed)
 08-14-2024 check A1c. On SSI.

## 2024-08-14 NOTE — Assessment & Plan Note (Signed)
 08-14-2024 related to betablocker dose. On Toprol -xl 25 mg daily. HR were in the 40s early this AM. By 6 pm this evening, HR in the 70s. Has been off Toprol -XL for about 24 hours. Will need to reduce dose of stop it all together.  Given his angina with exertion, will need some chronotropic control. Will reduce dose and give it to him at bedtime.

## 2024-08-14 NOTE — Plan of Care (Signed)

## 2024-08-14 NOTE — Assessment & Plan Note (Signed)
 08-14-2024 doubt he has UTI. More likely undertreated BPH symptoms.

## 2024-08-14 NOTE — Assessment & Plan Note (Addendum)
 08-14-2024 clearly pt's flomax  is not working. He states he gets up every 1 hour during night time to urinate. Has to stand up and wait for about 5 mins before urinary stream starts and has to bear down in order to force out urine. Pt states he has never seen an urologist. Will refer him to outpatient urology. Stop flomax . Start Uroxatral . Add proscar . Check PSA.

## 2024-08-14 NOTE — Assessment & Plan Note (Signed)
 08-14-2024 continue lipitor 40 mg qday

## 2024-08-14 NOTE — Assessment & Plan Note (Signed)
 08-14-2024 need to switch around dosing of his anti-anginal meds. If he is still having angina even with changing dosing and timing, may need to add Ranexa to his regimen.

## 2024-08-14 NOTE — Hospital Course (Addendum)
 CC: syncope HPI: Vincent Stevens is a 78 y.o. male with medical history significant of CAD status post CABG in 2019, type 2 diabetes, hypertension, hyperlipidemia, GERD, gout, melanoma, BPH presented to ED for evaluation of confusion, hypotension, and syncope.  Blood pressure dropped from 111/63 supine to 93/56 standing.  Bradycardia with heart rate as low as 40s.  Labs showing no leukocytosis or anemia, glucose 162, creatinine 1.27 (close to baseline), troponin negative x 2, COVID/influenza/RSV PCR negative.  UA with negative nitrite, small amount of leukocytes, and microscopy showing 6-10 WBCs and rare bacteria.  EKG showing sinus rhythm with marked sinus arrhythmia.  CT head showing no acute intracranial abnormality.  CT renal stone study showing bilateral nonobstructing renal calculi and no acute abnormality.  Patient was given ceftriaxone  and 1 L LR.  TR[facility] called to admit.   Patient is currently AAO x 4 and does not appear confused.  States yesterday he went to see his orthopedic doctor for his knees and while in the waiting room he started feeling dizzy and when they checked his blood pressure he was told that it was low.  States the day prior he was doing some yard work at home and started having substernal chest pain.  He then took a nitroglycerin  tablet and sat down to rest.  Soon after he passed out and was told by his wife that he had slumped over.  States he was evaluated in the emergency room at that time and was prescribed Imdur .  Patient thinks he felt dizzy yesterday as he is already on blood pressure medications and the new medication Imdur  dropped his blood pressure further.  No longer having any chest pain at this time.  Denies shortness of breath.  Reports chronic cough which he attributes to allergies/postnasal drip.  He reports getting up multiple times at night to go urinate and sensation of incomplete bladder emptying which is chronic.  Does take Flomax  at home.  For the past few days he  has had mild burning with urination.  Denies nausea, vomiting, or abdominal pain.  Significant Events: Admitted 08/13/2024 for syncope   Admission Labs: WBC 9.6, HgB 15.1, plt 208 Na 136, K 4.8, CO2 of 22, BUN 28, scr 1.27, glu 162 T prot 6.3, alb 3.8, AST 26, ALT 28, alk phos 28, t. Bili 1.1 UA small LE, WBC 6-10, rate bacteria Covid/rsv/flu negative  Admission Imaging Studies: CT head No acute intracranial abnormality noted  CT renal stone Bilateral nonobstructing renal calculi. No acute abnormality is noted. Diverticulosis without diverticulitis  Significant Labs:   Significant Imaging Studies: Echo LVEF 60-65%. Normal diastolic parameters.  Antibiotic Therapy: Anti-infectives (From admission, onward)    Start     Dose/Rate Route Frequency Ordered Stop   08/13/24 2245  cefTRIAXone  (ROCEPHIN ) 1 g in sodium chloride  0.9 % 100 mL IVPB        1 g 200 mL/hr over 30 Minutes Intravenous  Once 08/13/24 2234 08/13/24 2346       Procedures:   Consultants:

## 2024-08-15 DIAGNOSIS — I209 Angina pectoris, unspecified: Secondary | ICD-10-CM

## 2024-08-15 DIAGNOSIS — R001 Bradycardia, unspecified: Secondary | ICD-10-CM | POA: Diagnosis not present

## 2024-08-15 DIAGNOSIS — R55 Syncope and collapse: Secondary | ICD-10-CM | POA: Diagnosis not present

## 2024-08-15 DIAGNOSIS — I10 Essential (primary) hypertension: Secondary | ICD-10-CM | POA: Diagnosis not present

## 2024-08-15 LAB — URINE CULTURE

## 2024-08-15 LAB — GLUCOSE, CAPILLARY: Glucose-Capillary: 112 mg/dL — ABNORMAL HIGH (ref 70–99)

## 2024-08-15 LAB — BASIC METABOLIC PANEL WITH GFR
Anion gap: 8 (ref 5–15)
BUN: 29 mg/dL — ABNORMAL HIGH (ref 8–23)
CO2: 23 mmol/L (ref 22–32)
Calcium: 8.9 mg/dL (ref 8.9–10.3)
Chloride: 105 mmol/L (ref 98–111)
Creatinine, Ser: 1.27 mg/dL — ABNORMAL HIGH (ref 0.61–1.24)
GFR, Estimated: 57 mL/min — ABNORMAL LOW (ref >60)
Glucose, Bld: 142 mg/dL — ABNORMAL HIGH (ref 70–99)
Potassium: 4.6 mmol/L (ref 3.5–5.1)
Sodium: 136 mmol/L (ref 135–145)

## 2024-08-15 LAB — HEMOGLOBIN A1C
Hgb A1c MFr Bld: 5.9 % — ABNORMAL HIGH (ref 4.8–5.6)
Mean Plasma Glucose: 122.63 mg/dL

## 2024-08-15 LAB — PSA: Prostatic Specific Antigen: 0.45 ng/mL (ref 0.00–4.00)

## 2024-08-15 MED ORDER — ISOSORBIDE MONONITRATE ER 30 MG PO TB24: 30.0000 mg | ORAL_TABLET | Freq: Every morning | ORAL | 2 refills | Status: DC

## 2024-08-15 MED ORDER — FINASTERIDE 5 MG PO TABS: 5.0000 mg | ORAL_TABLET | Freq: Every day | ORAL | 2 refills | Status: DC

## 2024-08-15 MED ORDER — ISOSORBIDE MONONITRATE ER 30 MG PO TB24
30.0000 mg | ORAL_TABLET | Freq: Every day | ORAL | Status: DC
Start: 2024-08-15 — End: 2024-08-15

## 2024-08-15 MED ORDER — METOPROLOL SUCCINATE ER 25 MG PO TB24: 25.0000 mg | ORAL_TABLET | Freq: Every day | ORAL | 3 refills | Status: AC

## 2024-08-15 MED ORDER — ALFUZOSIN HCL ER 10 MG PO TB24: 10.0000 mg | ORAL_TABLET | Freq: Every day | ORAL | 1 refills | Status: DC

## 2024-08-15 MED ORDER — ISOSORBIDE MONONITRATE ER 30 MG PO TB24
15.0000 mg | ORAL_TABLET | Freq: Every day | ORAL | Status: DC
Start: 2024-08-15 — End: 2024-08-15

## 2024-08-15 NOTE — Discharge Summary (Signed)
 PATIENT DETAILS Name: Vincent Stevens Age: 78 y.o. Sex: male Date of Birth: [birth year] MRN: [id number]. Admitting Physician: Editha Ram, MD ERE:Ejmxzm, Worth HERO, MD  Admit Date: 08/13/2024 Discharge date: 08/15/2024  Recommendations for Outpatient Follow-up:  Follow up with PCP in 1-2 weeks Please obtain CMP/CBC in one week Optimize antihypertensives-BP soft. Ensure follow-up with urology   Admitted From:  Home  Disposition: Home   Discharge Condition: good  CODE STATUS:   Code Status: Full Code   Diet recommendation:  Diet Order             Diet - low sodium heart healthy           Diet Carb Modified           Diet heart healthy/carb modified Room service appropriate? Yes; Fluid consistency: Thin  Diet effective now                    Brief Summary: Seven 3-year-old with history of CAD s/p CABG, DM-2, HTN, HLD-who presented with some confusion/dizziness/presyncope-numerous antihypertensive medications changes made over the past several days.  Difficult studies 9/14>> CXR: No PNA 9/15>> CT head: No acute abnormality 9/15>> CT renal stone study: B/L nonobstructive renal calculi.  No acute abnormality. 9/15>> echo: EF 60-65%  Brief Hospital Course: Lightheadedness/presyncope Probably secondary to numerous antihypertensive medications adjustment over the past several days Medication changes were done on 9/16-blood pressure has been stable-in the low 100s. He ambulated around the unit today without any major issues Suspect for now-we can stop amlodipine , and continue his Imdur /Toprol  XL. He has appointment with cardiology upcoming-further optimization can be done then.  If he continues to have episodes of lightheadedness/presyncope-Will need 30-day heart monitoring-however telemonitoring overnight was unremarkable.  EF is stable.  Sinus bradycardia Significantly better after dose of Toprol  was reduced. Follow-up with cardiology/PCP  BPH Flomax   has been switched to urinary tract Syl by prior MD Continue Proscar  PSA unremarkable Follow-up with urology as previously scheduled.  Nocturia/dysuria Thought to be related to prostatism rather than UTI Follow-up with urology.  HLD Statin  History of CAD s/p CABG Currently without any anginal symptoms-ambulated in the hallway without any issues.  Continue antiplatelet/statin/beta-blocker.  DM 2 (A1c 5.9 on 9/17) Resume oral hypoglycemics on discharge.   Discharge Diagnoses:  Principal Problem:   Near syncope Active Problems:   Angina, class III (HCC)   Essential hypertension   Atherosclerosis of native coronary artery of native heart with stable angina pectoris (HCC)   Sinus bradycardia   Dyslipidemia   BPH (benign prostatic hyperplasia)   Dysuria   Diabetes mellitus due to underlying condition with unspecified complications [facility])   Discharge Instructions:  Activity:  As tolerated    Discharge Instructions     Ambulatory referral to Urology   Complete by: As directed    BPH with urinary obstruction/lower urinary tract symptoms.   Call MD for:  extreme fatigue   Complete by: As directed    Call MD for:  persistant dizziness or light-headedness   Complete by: As directed    Diet - low sodium heart healthy   Complete by: As directed    Diet Carb Modified   Complete by: As directed    Discharge instructions   Complete by: As directed    Follow with Primary MD  Kennyth Worth HERO, MD in 1-2 weeks  Please get a complete blood count and chemistry panel checked by your Primary MD at your next visit, and again  as instructed by your Primary MD.  Get Medicines reviewed and adjusted: Please take all your medications with you for your next visit with your Primary MD  Laboratory/radiological data: Please request your Primary MD to go over all hospital tests and procedure/radiological results at the follow up, please ask your Primary MD to get all Hospital records sent to  his/her office.  In some cases, they will be blood work, cultures and biopsy results pending at the time of your discharge. Please request that your primary care M.D. follows up on these results.  Also Note the following: If you experience worsening of your admission symptoms, develop shortness of breath, life threatening emergency, suicidal or homicidal thoughts you must seek medical attention immediately by calling 911 or calling your MD immediately  if symptoms less severe.  You must read complete instructions/literature along with all the possible adverse reactions/side effects for all the Medicines you take and that have been prescribed to you. Take any new Medicines after you have completely understood and accpet all the possible adverse reactions/side effects.   Do not drive when taking Pain medications or sleeping medications (Benzodaizepines)  Do not take more than prescribed Pain, Sleep and Anxiety Medications. It is not advisable to combine anxiety,sleep and pain medications without talking with your primary care practitioner  Special Instructions: If you have smoked or chewed Tobacco  in the last 2 yrs please stop smoking, stop any regular Alcohol  and or any Recreational drug use.  Wear Seat belts while driving.  Please note: You were cared for by a hospitalist during your hospital stay. Once you are discharged, your primary care physician will handle any further medical issues. Please note that NO REFILLS for any discharge medications will be authorized once you are discharged, as it is imperative that you return to your primary care physician (or establish a relationship with a primary care physician if you do not have one) for your post hospital discharge needs so that they can reassess your need for medications and monitor your lab values.   Non-Pharmacologic Reccomendations for Orthostatic Hypotension 1) Lifestyle modification. These measures include:             - Arising  slowly, in stages, from supine to seated to standing. This maneuver is most important in the morning, when orthostatic tolerance is lowest.             - Avoiding straining, coughing, and walking in hot weather.These activities reduce venous return and worsen orthostatic hypotension.             - Maintaining hydration and avoiding over-heating.             - Raising the head of the bed 30 to 45 degrees decreases renal perfusion, thereby activating the renin-angiotensin-aldosterone system and decreasing nocturnal diuresis, which can be pronounced in these patients.  These changes relieve orthostatic hypotension by expanding extracellular fluid volume and may reduce end organ damage by reducing supine HTN.             2) Exercise - walking 30 minutes a day, exercise in a swimming pool, exercise in a recumbent or seated position (using a stationary bike or rowing machine)             3) Abdominal binders              4) Compression Stockings             5) Increased salt and water intake to  2 L to 2.5 L of water a day             6) Modification of meals:             - Avoiding large meals             - Ingesting meals low in carbohydrates             - Alcohol should be avoided during the day as it is a vasodilator             - Drink water with meals             - Avoiding activities or sudden standing immediately after eating             7) Physical Countermaneuvers during daily activities: leg crossing, standing on tip toes, squatting   Increase activity slowly   Complete by: As directed       Allergies as of 08/15/2024       Reactions   Sulfamethoxazole Hives, Itching        Medication List     STOP taking these medications    amLODipine  2.5 MG tablet Commonly known as: NORVASC    tamsulosin  0.4 MG Caps capsule Commonly known as: FLOMAX        TAKE these medications    alfuzosin  10 MG 24 hr tablet Commonly known as: UROXATRAL  Take 1 tablet (10 mg total) by mouth at  bedtime.   aspirin  EC 81 MG tablet Take 1 tablet (81 mg total) by mouth daily.   finasteride  5 MG tablet Commonly known as: Proscar  Take 1 tablet (5 mg total) by mouth daily.   ibuprofen  200 MG tablet Commonly known as: ADVIL  Take 200 mg by mouth 2 (two) times daily as needed (arthritis apin).   isosorbide  mononitrate 30 MG 24 hr tablet Commonly known as: IMDUR  Take 1 tablet (30 mg total) by mouth every morning. What changed: when to take this   metFORMIN  500 MG tablet Commonly known as: GLUCOPHAGE  Take 500 mg by mouth daily with breakfast.   metoprolol  succinate 25 MG 24 hr tablet Commonly known as: TOPROL -XL Take 1 tablet (25 mg total) by mouth at bedtime. TAKE WITH OR IMMEDIATELY FOLLOWING A MEAL. What changed: when to take this   multivitamin with minerals Tabs tablet Take 1 tablet by mouth at bedtime.   nitroGLYCERIN  0.4 MG SL tablet Commonly known as: NITROSTAT  Place 0.4 mg under the tongue every 5 (five) minutes as needed for chest pain.   simvastatin  80 MG tablet Commonly known as: ZOCOR  Take 1 tablet (80 mg total) by mouth daily. Patient needs an appointment for further refills. 3 rd/final attempt   triamcinolone  ointment 0.5 % Commonly known as: KENALOG  APPLY 1 APPLICATION TOPICALLY 2 (TWO) TIMES DAILY.        Follow-up Information     Kennyth Worth HERO, MD. Schedule an appointment as soon as possible for a visit in 1 week(s).   Specialty: Family Medicine Contact information: [address] [place] [address] [phone]                Allergies  Allergen Reactions   Sulfamethoxazole Hives and Itching     Other Procedures/Studies: ECHOCARDIOGRAM COMPLETE Result Date: 08/14/2024    ECHOCARDIOGRAM REPORT   Patient Name:   Vincent Stevens Date of Exam: 08/14/2024 Medical Rec #:  [id number]        Height:       70.0 in Accession #:  [id number]       Weight:       197.0 lb Date of Birth:  1946-08-01         BSA:          2.074 m Patient Age:     78 years         BP:           109/63 mmHg Patient Gender: M                HR:           66 bpm. Exam Location:  Inpatient Procedure: 2D Echo (Both Spectral and Color Flow Doppler were utilized during            procedure). Indications:    murmur  History:        Patient has prior history of Echocardiogram examinations, most                 recent 05/23/2019. Prior CABG and Abnormal ECG; Risk                 Factors:Hypertension, Dyslipidemia and Diabetes.  Sonographer:    Tinnie Barefoot RDCS Referring Phys: [id number] VASUNDHRA RATHORE IMPRESSIONS  1. Left ventricular ejection fraction, by estimation, is 60 to 65%. The left ventricle has normal function. The left ventricle has no regional wall motion abnormalities. Left ventricular diastolic parameters were normal.  2. Right ventricular systolic function is normal. The right ventricular size is normal. There is normal pulmonary artery systolic pressure.  3. The mitral valve is normal in structure. No evidence of mitral valve regurgitation.  4. The aortic valve is grossly normal. Aortic valve regurgitation is not visualized. Aortic valve mean gradient measures 11.0 mmHg. FINDINGS  Left Ventricle: Left ventricular ejection fraction, by estimation, is 60 to 65%. The left ventricle has normal function. The left ventricle has no regional wall motion abnormalities. The left ventricular internal cavity size was normal in size. There is  no left ventricular hypertrophy. Left ventricular diastolic parameters were normal. Right Ventricle: The right ventricular size is normal. No increase in right ventricular wall thickness. Right ventricular systolic function is normal. There is normal pulmonary artery systolic pressure. The tricuspid regurgitant velocity is 2.66 m/s, and  with an assumed right atrial pressure of 3 mmHg, the estimated right ventricular systolic pressure is 31.3 mmHg. Left Atrium: Left atrial size was normal in size. Right Atrium: Right atrial size was  normal in size. Pericardium: There is no evidence of pericardial effusion. Mitral Valve: The mitral valve is normal in structure. No evidence of mitral valve regurgitation. Tricuspid Valve: The tricuspid valve is normal in structure. Tricuspid valve regurgitation is mild. Aortic Valve: The aortic valve is grossly normal. There is mild aortic valve annular calcification. Aortic valve regurgitation is not visualized. Aortic valve mean gradient measures 11.0 mmHg. Pulmonic Valve: The pulmonic valve was normal in structure. Pulmonic valve regurgitation is not visualized. Aorta: The aortic root and ascending aorta are structurally normal, with no evidence of dilitation. IAS/Shunts: The interatrial septum was not well visualized.  LEFT VENTRICLE PLAX 2D LVIDd:         4.30 cm   Diastology LVIDs:         2.50 cm   LV e' medial:    7.29 cm/s LV PW:         1.00 cm   LV E/e' medial:  12.6 LV IVS:        1.20 cm  LV e' lateral:   12.20 cm/s LVOT diam:     2.00 cm   LV E/e' lateral: 7.5 LV SV:         86 LV SV Index:   41 LVOT Area:     3.14 cm  RIGHT VENTRICLE             IVC RV Basal diam:  2.70 cm     IVC diam: 1.50 cm RV S prime:     12.00 cm/s TAPSE (M-mode): 1.6 cm LEFT ATRIUM             Index        RIGHT ATRIUM           Index LA diam:        3.20 cm 1.54 cm/m   RA Area:     15.70 cm LA Vol (A2C):   53.6 ml 25.85 ml/m  RA Volume:   39.30 ml  18.95 ml/m LA Vol (A4C):   57.8 ml 27.87 ml/m LA Biplane Vol: 60.3 ml 29.08 ml/m  AORTIC VALVE AV Mean Grad: 11.0 mmHg LVOT Vmax:    117.50 cm/s LVOT Vmean:   78.800 cm/s LVOT VTI:     0.272 m  AORTA Ao Root diam: 3.10 cm Ao Asc diam:  3.60 cm MITRAL VALVE               TRICUSPID VALVE MV Area (PHT): 2.24 cm    TR Peak grad:   28.3 mmHg MV Decel Time: 338 msec    TR Vmax:        266.00 cm/s MV E velocity: 92.00 cm/s MV A velocity: 97.40 cm/s  SHUNTS MV E/A ratio:  0.94        Systemic VTI:  0.27 m                            Systemic Diam: 2.00 cm Aditya Sabharwal  Electronically signed by Ria Commander Signature Date/Time: 08/14/2024/2:08:44 PM    Final    CT Renal Stone Study Result Date: 08/13/2024 CLINICAL DATA:  Flank pain EXAM: CT ABDOMEN AND PELVIS WITHOUT CONTRAST TECHNIQUE: Multidetector CT imaging of the abdomen and pelvis was performed following the standard protocol without IV contrast. RADIATION DOSE REDUCTION: This exam was performed according to the departmental dose-optimization program which includes automated exposure control, adjustment of the mA and/or kV according to patient size and/or use of iterative reconstruction technique. COMPARISON:  None Available. FINDINGS: Lower chest: Lung bases demonstrate mild subpleural fibrotic changes worse on the right than the left. No focal infiltrate is seen. Hepatobiliary: No focal liver abnormality is seen. No gallstones, gallbladder wall thickening, or biliary dilatation. Pancreas: Unremarkable. No pancreatic ductal dilatation or surrounding inflammatory changes. Spleen: Normal in size without focal abnormality. Adrenals/Urinary Tract: Adrenal glands are within normal limits. Kidneys demonstrate nonobstructing stones bilaterally measuring 2 mm or smaller. The ureters are within normal limits. The bladder is well distended. Stomach/Bowel: The appendix is within normal limits. No obstructive or inflammatory changes of the colon are seen. Diverticular change is noted without evidence of diverticulitis. The small bowel and stomach are within normal limits. Vascular/Lymphatic: Aortic atherosclerosis. No enlarged abdominal or pelvic lymph nodes. Reproductive: Prostate is unremarkable. Other: No abdominal wall hernia or abnormality. No abdominopelvic ascites. Musculoskeletal: No acute or significant osseous findings. IMPRESSION: Bilateral nonobstructing renal calculi. No acute abnormality is noted. Diverticulosis without diverticulitis. Electronically Signed   By: Oneil Evelyne HERO.D.  On: 08/13/2024 23:14   CT HEAD  WO CONTRAST ( ) Result Date: 08/13/2024 CLINICAL DATA:  Altered mental status EXAM: CT HEAD WITHOUT CONTRAST TECHNIQUE: Contiguous axial images were obtained from the base of the skull through the vertex without intravenous contrast. RADIATION DOSE REDUCTION: This exam was performed according to the departmental dose-optimization program which includes automated exposure control, adjustment of the mA and/or kV according to patient size and/or use of iterative reconstruction technique. COMPARISON:  None Available. FINDINGS: Brain: No evidence of acute infarction, hemorrhage, hydrocephalus, extra-axial collection or mass lesion/mass effect. Vascular: No hyperdense vessel or unexpected calcification. Skull: Normal. Negative for fracture or focal lesion. Sinuses/Orbits: No acute finding. Other: None. IMPRESSION: No acute intracranial abnormality noted. Electronically Signed   By: Oneil Devonshire M.D.   On: 08/13/2024 23:12   DG Chest 2 View Result Date: 08/12/2024 EXAM: 2 VIEW(S) XRAY OF THE CHEST 08/12/2024 10:59:00 AM COMPARISON: 07/23/2024 CLINICAL HISTORY: Chest pain. Ordered for chest pain. He notes h/o cardiac bypass and takes nitroglycerin . Per triage notes: Pt states he went out side and when he came back in he sat down due to developing chest pain, he decided to take 1 nitroglycerin  and the next thing he remembers is waking up to his family around him shaking him. Family states he was dosing off in the car on the way here. FINDINGS: LUNGS AND PLEURA: No focal pulmonary opacity. No pulmonary edema. No pleural effusion. No pneumothorax. HEART AND MEDIASTINUM: CABG markers noted. Sternotomy wires noted. No acute abnormality of the cardiac and mediastinal silhouettes. BONES AND SOFT TISSUES: Multiple level vertebral endplate spurring in lower thoracic spine. No acute osseous abnormality. IMPRESSION: 1. No acute process. Electronically signed by: Waddell Calk MD 08/12/2024 11:16 AM EDT RP Workstation: [id number]    DG Chest Portable 1 View Result Date: 07/23/2024 CLINICAL DATA:  77 year old male with shortness of breath, hypotension. EXAM: PORTABLE CHEST 1 VIEW COMPARISON:  Chest radiographs 04/10/2018 and earlier. FINDINGS: Portable AP upright view at 1015 hours. Chronic sternotomy. Lung volumes and mediastinal contours are stable and within normal limits. Visualized tracheal air column is within normal limits. Allowing for portable technique the lungs are clear. No acute osseous abnormality identified. Paucity of bowel gas in the visible abdomen. IMPRESSION: No acute cardiopulmonary abnormality. Electronically Signed   By: VEAR Hurst M.D.   On: 07/23/2024 11:31     TODAY-DAY OF DISCHARGE:  Subjective:   Vincent Stevens today has no headache,no chest abdominal pain,no new weakness tingling or numbness, feels much better wants to go home today.  Objective:   Blood pressure 115/69, pulse 60, temperature 98 F (36.7 C), temperature source Oral, resp. rate 18, height 5' 9 (1.753 m), weight 89.4 kg, SpO2 95%.  Intake/Output Summary (Last 24 hours) at 08/15/2024 1058 Last data filed at 08/15/2024 0100 Gross per 24 hour  Intake --  Output 500 ml  Net -500 ml   Filed Weights   08/14/24 0629  Weight: 89.4 kg    Exam: Awake Alert, Oriented *3, No new F.N deficits, Normal affect [place].AT,PERRAL Supple Neck,No JVD, No cervical lymphadenopathy appriciated.  Symmetrical Chest wall movement, Good air movement bilaterally, CTAB RRR,No Gallops,Rubs or new Murmurs, No Parasternal Heave +ve B.Sounds, Abd Soft, Non tender, No organomegaly appriciated, No rebound -guarding or rigidity. No Cyanosis, Clubbing or edema, No new Rash or bruise   PERTINENT RADIOLOGIC STUDIES: ECHOCARDIOGRAM COMPLETE Result Date: 08/14/2024    ECHOCARDIOGRAM REPORT   Patient Name:   Vincent Stevens Date of Exam: 08/14/2024  Medical Rec #:  [id number]        Height:       70.0 in Accession #:    [id number]       Weight:       197.0 lb  Date of Birth:  February 04, 1946         BSA:          2.074 m Patient Age:    78 years         BP:           109/63 mmHg Patient Gender: M                HR:           66 bpm. Exam Location:  Inpatient Procedure: 2D Echo (Both Spectral and Color Flow Doppler were utilized during            procedure). Indications:    murmur  History:        Patient has prior history of Echocardiogram examinations, most                 recent 05/23/2019. Prior CABG and Abnormal ECG; Risk                 Factors:Hypertension, Dyslipidemia and Diabetes.  Sonographer:    Tinnie Barefoot RDCS Referring Phys: [id number] VASUNDHRA RATHORE IMPRESSIONS  1. Left ventricular ejection fraction, by estimation, is 60 to 65%. The left ventricle has normal function. The left ventricle has no regional wall motion abnormalities. Left ventricular diastolic parameters were normal.  2. Right ventricular systolic function is normal. The right ventricular size is normal. There is normal pulmonary artery systolic pressure.  3. The mitral valve is normal in structure. No evidence of mitral valve regurgitation.  4. The aortic valve is grossly normal. Aortic valve regurgitation is not visualized. Aortic valve mean gradient measures 11.0 mmHg. FINDINGS  Left Ventricle: Left ventricular ejection fraction, by estimation, is 60 to 65%. The left ventricle has normal function. The left ventricle has no regional wall motion abnormalities. The left ventricular internal cavity size was normal in size. There is  no left ventricular hypertrophy. Left ventricular diastolic parameters were normal. Right Ventricle: The right ventricular size is normal. No increase in right ventricular wall thickness. Right ventricular systolic function is normal. There is normal pulmonary artery systolic pressure. The tricuspid regurgitant velocity is 2.66 m/s, and  with an assumed right atrial pressure of 3 mmHg, the estimated right ventricular systolic pressure is 31.3 mmHg. Left Atrium: Left  atrial size was normal in size. Right Atrium: Right atrial size was normal in size. Pericardium: There is no evidence of pericardial effusion. Mitral Valve: The mitral valve is normal in structure. No evidence of mitral valve regurgitation. Tricuspid Valve: The tricuspid valve is normal in structure. Tricuspid valve regurgitation is mild. Aortic Valve: The aortic valve is grossly normal. There is mild aortic valve annular calcification. Aortic valve regurgitation is not visualized. Aortic valve mean gradient measures 11.0 mmHg. Pulmonic Valve: The pulmonic valve was normal in structure. Pulmonic valve regurgitation is not visualized. Aorta: The aortic root and ascending aorta are structurally normal, with no evidence of dilitation. IAS/Shunts: The interatrial septum was not well visualized.  LEFT VENTRICLE PLAX 2D LVIDd:         4.30 cm   Diastology LVIDs:         2.50 cm   LV e' medial:    7.29 cm/s LV PW:  1.00 cm   LV E/e' medial:  12.6 LV IVS:        1.20 cm   LV e' lateral:   12.20 cm/s LVOT diam:     2.00 cm   LV E/e' lateral: 7.5 LV SV:         86 LV SV Index:   41 LVOT Area:     3.14 cm  RIGHT VENTRICLE             IVC RV Basal diam:  2.70 cm     IVC diam: 1.50 cm RV S prime:     12.00 cm/s TAPSE (M-mode): 1.6 cm LEFT ATRIUM             Index        RIGHT ATRIUM           Index LA diam:        3.20 cm 1.54 cm/m   RA Area:     15.70 cm LA Vol (A2C):   53.6 ml 25.85 ml/m  RA Volume:   39.30 ml  18.95 ml/m LA Vol (A4C):   57.8 ml 27.87 ml/m LA Biplane Vol: 60.3 ml 29.08 ml/m  AORTIC VALVE AV Mean Grad: 11.0 mmHg LVOT Vmax:    117.50 cm/s LVOT Vmean:   78.800 cm/s LVOT VTI:     0.272 m  AORTA Ao Root diam: 3.10 cm Ao Asc diam:  3.60 cm MITRAL VALVE               TRICUSPID VALVE MV Area (PHT): 2.24 cm    TR Peak grad:   28.3 mmHg MV Decel Time: 338 msec    TR Vmax:        266.00 cm/s MV E velocity: 92.00 cm/s MV A velocity: 97.40 cm/s  SHUNTS MV E/A ratio:  0.94        Systemic VTI:  0.27 m                             Systemic Diam: 2.00 cm Aditya Sabharwal Electronically signed by Ria Commander Signature Date/Time: 08/14/2024/2:08:44 PM    Final    CT Renal Stone Study Result Date: 08/13/2024 CLINICAL DATA:  Flank pain EXAM: CT ABDOMEN AND PELVIS WITHOUT CONTRAST TECHNIQUE: Multidetector CT imaging of the abdomen and pelvis was performed following the standard protocol without IV contrast. RADIATION DOSE REDUCTION: This exam was performed according to the departmental dose-optimization program which includes automated exposure control, adjustment of the mA and/or kV according to patient size and/or use of iterative reconstruction technique. COMPARISON:  None Available. FINDINGS: Lower chest: Lung bases demonstrate mild subpleural fibrotic changes worse on the right than the left. No focal infiltrate is seen. Hepatobiliary: No focal liver abnormality is seen. No gallstones, gallbladder wall thickening, or biliary dilatation. Pancreas: Unremarkable. No pancreatic ductal dilatation or surrounding inflammatory changes. Spleen: Normal in size without focal abnormality. Adrenals/Urinary Tract: Adrenal glands are within normal limits. Kidneys demonstrate nonobstructing stones bilaterally measuring 2 mm or smaller. The ureters are within normal limits. The bladder is well distended. Stomach/Bowel: The appendix is within normal limits. No obstructive or inflammatory changes of the colon are seen. Diverticular change is noted without evidence of diverticulitis. The small bowel and stomach are within normal limits. Vascular/Lymphatic: Aortic atherosclerosis. No enlarged abdominal or pelvic lymph nodes. Reproductive: Prostate is unremarkable. Other: No abdominal wall hernia or abnormality. No abdominopelvic ascites. Musculoskeletal: No acute or significant osseous findings.  IMPRESSION: Bilateral nonobstructing renal calculi. No acute abnormality is noted. Diverticulosis without diverticulitis. Electronically Signed    By: Oneil Devonshire M.D.   On: 08/13/2024 23:14   CT HEAD WO CONTRAST ( ) Result Date: 08/13/2024 CLINICAL DATA:  Altered mental status EXAM: CT HEAD WITHOUT CONTRAST TECHNIQUE: Contiguous axial images were obtained from the base of the skull through the vertex without intravenous contrast. RADIATION DOSE REDUCTION: This exam was performed according to the departmental dose-optimization program which includes automated exposure control, adjustment of the mA and/or kV according to patient size and/or use of iterative reconstruction technique. COMPARISON:  None Available. FINDINGS: Brain: No evidence of acute infarction, hemorrhage, hydrocephalus, extra-axial collection or mass lesion/mass effect. Vascular: No hyperdense vessel or unexpected calcification. Skull: Normal. Negative for fracture or focal lesion. Sinuses/Orbits: No acute finding. Other: None. IMPRESSION: No acute intracranial abnormality noted. Electronically Signed   By: Oneil Devonshire M.D.   On: 08/13/2024 23:12     PERTINENT LAB RESULTS: CBC: Recent Labs    08/13/24 1412  WBC 9.6  HGB 15.1  HCT 47.2  PLT 208   CMET CMP     Component Value Date/Time   NA 136 08/15/2024 0331   NA 142 03/09/2024 1105   K 4.6 08/15/2024 0331   CL 105 08/15/2024 0331   CO2 23 08/15/2024 0331   GLUCOSE 142 (H) 08/15/2024 0331   BUN 29 (H) 08/15/2024 0331   BUN 17 03/09/2024 1105   CREATININE 1.27 (H) 08/15/2024 0331   CALCIUM  8.9 08/15/2024 0331   PROT 6.3 (L) 08/13/2024 1412   PROT 6.5 03/01/2024 0825   ALBUMIN  3.8 08/13/2024 1412   ALBUMIN  4.6 03/01/2024 0825   AST 26 08/13/2024 1412   ALT 28 08/13/2024 1412   ALKPHOS 41 08/13/2024 1412   BILITOT 1.1 08/13/2024 1412   BILITOT 0.5 03/01/2024 0825   GFR 72.03 09/12/2023 1021   EGFR 71 03/09/2024 1105   GFRNONAA 57 (L) 08/15/2024 0331    GFR Estimated Creatinine Clearance: 52.2 mL/min (A) (by C-G formula based on SCr of 1.27 mg/dL (H)). No results for input(s): LIPASE, AMYLASE  in the last 72 hours. No results for input(s): CKTOTAL, CKMB, CKMBINDEX, TROPONINI in the last 72 hours. Invalid input(s): POCBNP No results for input(s): DDIMER in the last 72 hours. Recent Labs    08/15/24 0331  HGBA1C 5.9*   No results for input(s): CHOL, HDL, LDLCALC, TRIG, CHOLHDL, LDLDIRECT in the last 72 hours. No results for input(s): TSH, T4TOTAL, T3FREE, THYROIDAB in the last 72 hours.  Invalid input(s): FREET3 No results for input(s): VITAMINB12, FOLATE, FERRITIN, TIBC, IRON, RETICCTPCT in the last 72 hours. Coags: No results for input(s): INR in the last 72 hours.  Invalid input(s): PT Microbiology: Recent Results (from the past 240 hours)  Resp panel by RT-PCR (RSV, Flu A&B, Covid) Anterior Nasal Swab     Status: None   Collection Time: 08/13/24 10:34 PM   Specimen: Anterior Nasal Swab  Result Value Ref Range Status   SARS Coronavirus 2 by RT PCR NEGATIVE NEGATIVE Final   Influenza A by PCR NEGATIVE NEGATIVE Final   Influenza B by PCR NEGATIVE NEGATIVE Final    Comment: (NOTE) The Xpert Xpress SARS-CoV-2/FLU/RSV plus assay is intended as an aid in the diagnosis of influenza from Nasopharyngeal swab specimens and should not be used as a sole basis for treatment. Nasal washings and aspirates are unacceptable for Xpert Xpress SARS-CoV-2/FLU/RSV testing.  Fact Sheet for Patients: BloggerCourse.com  Fact Sheet for Healthcare  Providers: SeriousBroker.it  This test is not yet approved or cleared by the [place]  FDA and has been authorized for detection and/or diagnosis of SARS-CoV-2 by FDA under an Emergency Use Authorization (EUA). This EUA will remain in effect (meaning this test can be used) for the duration of the COVID-19 declaration under Section 564(b)(1) of the Act, 21 U.S.C. section 360bbb-3(b)(1), unless the authorization is terminated or revoked.      Resp Syncytial Virus by PCR NEGATIVE NEGATIVE Final    Comment: (NOTE) Fact Sheet for Patients: BloggerCourse.com  Fact Sheet for Healthcare Providers: SeriousBroker.it  This test is not yet approved or cleared by the [place]  FDA and has been authorized for detection and/or diagnosis of SARS-CoV-2 by FDA under an Emergency Use Authorization (EUA). This EUA will remain in effect (meaning this test can be used) for the duration of the COVID-19 declaration under Section 564(b)(1) of the Act, 21 U.S.C. section 360bbb-3(b)(1), unless the authorization is terminated or revoked.  Performed at [facility], [address], [place], [place] [address]     FURTHER DISCHARGE INSTRUCTIONS:  Get Medicines reviewed and adjusted: Please take all your medications with you for your next visit with your Primary MD  Laboratory/radiological data: Please request your Primary MD to go over all hospital tests and procedure/radiological results at the follow up, please ask your Primary MD to get all Hospital records sent to his/her office.  In some cases, they will be blood work, cultures and biopsy results pending at the time of your discharge. Please request that your primary care M.D. goes through all the records of your hospital data and follows up on these results.  Also Note the following: If you experience worsening of your admission symptoms, develop shortness of breath, life threatening emergency, suicidal or homicidal thoughts you must seek medical attention immediately by calling 911 or calling your MD immediately  if symptoms less severe.  You must read complete instructions/literature along with all the possible adverse reactions/side effects for all the Medicines you take and that have been prescribed to you. Take any new Medicines after you have completely understood and accpet all the possible adverse reactions/side effects.    Do not drive when taking Pain medications or sleeping medications (Benzodaizepines)  Do not take more than prescribed Pain, Sleep and Anxiety Medications. It is not advisable to combine anxiety,sleep and pain medications without talking with your primary care practitioner  Special Instructions: If you have smoked or chewed Tobacco  in the last 2 yrs please stop smoking, stop any regular Alcohol  and or any Recreational drug use.  Wear Seat belts while driving.  Please note: You were cared for by a hospitalist during your hospital stay. Once you are discharged, your primary care physician will handle any further medical issues. Please note that NO REFILLS for any discharge medications will be authorized once you are discharged, as it is imperative that you return to your primary care physician (or establish a relationship with a primary care physician if you do not have one) for your post hospital discharge needs so that they can reassess your need for medications and monitor your lab values.  Total Time spent coordinating discharge including counseling, education and face to face time equals greater than 30 minutes.  SignedBETHA Donalda Applebaum 08/15/2024 10:58 AM

## 2024-08-15 NOTE — TOC Initial Note (Signed)
 Transition of Care [facility]) - Initial/Assessment Note    Patient Details  Name: Vincent Stevens MRN: [id number] Date of Birth: [birth year]  [facility] CM/SW Contact:    Marval Gell, RN Phone Number: 08/15/2024, 8:01 AM  Clinical Narrative:                  Patient from home, with spouse.  Workup for syncope.  Independent PTA.  Insured with PCP listed.  Will follow for transition needs   Expected Discharge Plan: Home/Self Care Barriers to Discharge: Continued Medical Work up   Patient Goals and CMS Choice            Expected Discharge Plan and Services   Discharge Planning Services: CM Consult   Living arrangements for the past 2 months: Single Family Home                                      Prior Living Arrangements/Services Living arrangements for the past 2 months: Single Family Home Lives with:: Spouse                   Activities of Daily Living   ADL Screening (condition at time of admission) Independently performs ADLs?: Yes (appropriate for developmental age) Is the patient deaf or have difficulty hearing?: Yes Does the patient have difficulty seeing, even when wearing glasses/contacts?: No Does the patient have difficulty concentrating, remembering, or making decisions?: Yes  Permission Sought/Granted                  Emotional Assessment              Admission diagnosis:  Orthostatic hypotension [I95.1] Dysuria [R30.0] Confusion [R41.0] Syncope [R55] Elevated serum creatinine [R79.89] Abnormal urinalysis [R82.90] Patient Active Problem List   Diagnosis Date Noted   Near syncope 08/14/2024   Dysuria 08/14/2024   Sinus bradycardia 08/14/2024   Atherosclerosis of native coronary artery of native heart with stable angina pectoris (HCC) 08/12/2024   Angina pectoris (HCC) 11/02/2023   Diabetes mellitus due to underlying condition with unspecified complications (HCC) 09/13/2022   GERD (gastroesophageal reflux  disease) 09/09/2022   Allergy 06/17/2021   Pain in both lower extremities 08/15/2018   Gout    Melanoma (HCC)    Angina, class III (HCC) 03/01/2018   Hx of CABG 03/01/2018   Coronary artery disease involving native coronary artery of native heart with angina pectoris (HCC)    Dyslipidemia 08/14/2010   BPH (benign prostatic hyperplasia) 09/19/2009   Essential hypertension 01/17/2009   History of colonic polyps 09/27/2008   PCP:  Kennyth Worth HERO, MD Pharmacy:   [facility] [phone] - [place], [place] - [address] [place] [address] Phone: [phone] Fax: [phone]  [id number] - [place], [place] - [address] [place] [address] Phone: [phone] Fax: [phone]     Social Drivers of Health (SDOH) Social History: SDOH Screenings   Food Insecurity: No Food Insecurity (08/14/2024)  Housing: Low Risk  (08/14/2024)  Transportation Needs: No Transportation Needs (08/14/2024)  Utilities: Not At Risk (08/14/2024)  Alcohol Screen: Low Risk  (04/10/2024)  Depression (PHQ2-9): Low Risk  (04/10/2024)  Financial Resource Strain: Low Risk  (04/10/2024)  Physical Activity: Inactive (04/10/2024)  Social Connections: Moderately Isolated (08/14/2024)  Stress: No Stress Concern Present (04/10/2024)  Tobacco Use: Medium Risk (08/14/2024)  Health Literacy: Adequate Health Literacy (  04/10/2024)   SDOH Interventions:     Readmission Risk Interventions     No data to display

## 2024-08-15 NOTE — Evaluation (Signed)
 Physical Therapy Evaluation Patient Details Name: Vincent Stevens MRN: [id number] DOB: Apr 04, 1946 Today's Date: 08/15/2024  History of Present Illness  78 yo male arrives to [facility] ED on 08/13/24 with complaints of confusion, hypotension, and syncope. CT head showed nothing acute. CT renal showed bilateral nonobstructing renal calculi. PMH: CAD status post CABG in 2019, DM2, HTN, HLD, GERD, gout, melanoma, BPH  Clinical Impression  Pt presents with mild dizziness during session. Pt demonstrated no orthostasis during today's session. Pt ambulated short community distance in the hallway with no overt LOB with modified DGI. Pt states that he feels close to baseline and that he feel as if he is moving well. Educated patient to use [facility] on days that he feels unsteady and to watch for signs of dizziness. Pt is expected to return to baseline once medication is managed and increased level of activity. PT to sign off for now.     BP Seated EOB: 89/64 BP Standing: 84/60 BP Standing after 2 min: 86/59 BP after activity: 105/74    If plan is discharge home, recommend the following: Assist for transportation   Can travel by private vehicle        Equipment Recommendations None recommended by PT  Recommendations for Other Services       Functional Status Assessment Patient has had a recent decline in their functional status and demonstrates the ability to make significant improvements in function in a reasonable and predictable amount of time.     Precautions / Restrictions Precautions Precautions: Fall Recall of Precautions/Restrictions: Intact Restrictions Weight Bearing Restrictions Per Provider Order: No      Mobility  Bed Mobility Overal bed mobility: Modified Independent             General bed mobility comments: EOB at start and end of session    Transfers Overall transfer level: Needs assistance Equipment used: None Transfers: Sit to/from Stand Sit to Stand: Supervision            General transfer comment: For safety    Ambulation/Gait Ambulation/Gait assistance: Supervision Gait Distance (Feet): 750 Feet Assistive device: None Gait Pattern/deviations: Step-through pattern, Decreased stride length, Narrow base of support Gait velocity: decreased     General Gait Details: No overt LOB with modified DGI (head turns, gait speed, stepping over obstacle)  Stairs            Wheelchair Mobility     Tilt Bed    Modified Rankin (Stroke Patients Only)       Balance Overall balance assessment: Needs assistance   Sitting balance-Leahy Scale: Good       Standing balance-Leahy Scale: Good                               Pertinent Vitals/Pain Pain Assessment Pain Assessment: No/denies pain    Home Living Family/patient expects to be discharged to:: Private residence Living Arrangements: Spouse/significant other Available Help at Discharge: Family Type of Home: House Home Access: Stairs to enter Entrance Stairs-Rails: None Entrance Stairs-Number of Steps: 1 Alternate Level Stairs-Number of Steps: 10 Home Layout: Able to live on main level with bedroom/bathroom;Two level Home Equipment: Agricultural consultant (2 wheels);Cane - single point      Prior Function Prior Level of Function : Independent/Modified Independent;Driving;Working/employed             Mobility Comments: No AD at baseline 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  Extremity/Trunk Assessment   Upper Extremity Assessment Upper Extremity Assessment: Generalized weakness    Lower Extremity Assessment Lower Extremity Assessment: Generalized weakness (At least 3/5 BLE)    Cervical / Trunk Assessment Cervical / Trunk Assessment: Kyphotic  Communication   Communication Communication: Impaired Factors Affecting Communication: Hearing impaired    Cognition Arousal: Alert Behavior During Therapy: WFL for tasks assessed/performed   PT -  Cognitive impairments: No apparent impairments                         Following commands: Intact       Cueing Cueing Techniques: Verbal cues, Gestural cues, Tactile cues     General Comments General comments (skin integrity, edema, etc.): No signs of orthostatic hypotension. VSS on RA    Exercises     Assessment/Plan    PT Assessment Patient does not need any further PT services  PT Problem List         PT Treatment Interventions      PT Goals (Current goals can be found in the Care Plan section)  Acute Rehab PT Goals Patient Stated Goal: Return home PT Goal Formulation: With patient Time For Goal Achievement: 08/15/24 Potential to Achieve Goals: Good    Frequency       Co-evaluation               AM-PAC PT 6 Clicks Mobility  Outcome Measure Help needed turning from your back to your side while in a flat bed without using bedrails?: A Little Help needed moving from lying on your back to sitting on the side of a flat bed without using bedrails?: None Help needed moving to and from a bed to a chair (including a wheelchair)?: None Help needed standing up from a chair using your arms (e.g., wheelchair or bedside chair)?: None Help needed to walk in hospital room?: None Help needed climbing 3-5 steps with a railing? : A Little 6 Click Score: 22    End of Session Equipment Utilized During Treatment: Gait belt Activity Tolerance: Patient tolerated treatment well Patient left: in bed;with call bell/phone within reach;with family/visitor present Nurse Communication: Mobility status PT Visit Diagnosis: Unsteadiness on feet (R26.81);Muscle weakness (generalized) (M62.81)    Time: 9074-9050 PT Time Calculation (min) (ACUTE ONLY): 24 min   Charges:   PT Evaluation $PT Eval Moderate Complexity: 1 Mod PT Treatments $Gait Training: 8-22 mins PT General Charges $$ ACUTE PT VISIT: 1 Visit         Quintin Campi, SPT  Acute Rehab   [phone]   Quintin Campi 08/15/2024, 10:31 AM

## 2024-08-15 NOTE — Plan of Care (Signed)

## 2024-08-16 ENCOUNTER — Telehealth: Payer: Self-pay

## 2024-08-16 NOTE — Transitions of Care (Post Inpatient/ED Visit) (Signed)
 08/16/2024  Name: Vincent Stevens MRN: [id number] DOB: 08-Mar-1946  Today's TOC FU Call Status: Today's TOC FU Call Status:: Successful TOC FU Call Completed TOC FU Call Complete Date: 08/16/24 Patient's Name and Date of Birth confirmed.  Transition Care Management Follow-up Telephone Call Date of Discharge: 08/15/24 Discharge Facility: [facility]) Type of Discharge: Inpatient Admission Primary Inpatient Discharge Diagnosis:: dizziness How have you been since you were released from the hospital?: Better Any questions or concerns?: No  Items Reviewed: Did you receive and understand the discharge instructions provided?: Yes Medications obtained,verified, and reconciled?: Yes (Medications Reviewed) Any new allergies since your discharge?: No Dietary orders reviewed?: Yes Do you have support at home?: Yes People in Home [RPT]: spouse  Medications Reviewed Today: Medications Reviewed Today     Reviewed by Emmitt Pan, LPN (Licensed Practical Nurse) on 08/16/24 at 1509  Med List Status: <None>   Medication Order Taking? Sig Documenting Provider Last Dose Status Informant  alfuzosin  (UROXATRAL ) 10 MG 24 hr tablet [id number] Yes Take 1 tablet (10 mg total) by mouth at bedtime. Raenelle Donalda HERO, MD  Active   aspirin  EC 81 MG tablet [id number] Yes Take 1 tablet (81 mg total) by mouth daily. Monetta Redell PARAS, MD  Active Spouse/Significant Other, Pharmacy Records, Self  finasteride  (PROSCAR ) 5 MG tablet [id number] Yes Take 1 tablet (5 mg total) by mouth daily. Raenelle Donalda HERO, MD  Active   ibuprofen  (ADVIL ,MOTRIN ) 200 MG tablet [id number] Yes Take 200 mg by mouth 2 (two) times daily as needed (arthritis apin).  [provider]  Active Spouse/Significant Other, Pharmacy Records, Self  isosorbide  mononitrate (IMDUR ) 30 MG 24 hr tablet [id number] Yes Take 1 tablet (30 mg total) by mouth every morning. Raenelle Donalda HERO, MD  Active   metFORMIN  (GLUCOPHAGE ) 500 MG tablet  [id number] Yes Take 500 mg by mouth daily with breakfast. Ladona Heinz, MD  Active Spouse/Significant Other, Pharmacy Records, Self  metoprolol  succinate (TOPROL -XL) 25 MG 24 hr tablet [id number] Yes Take 1 tablet (25 mg total) by mouth at bedtime. TAKE WITH OR IMMEDIATELY FOLLOWING A MEAL. Raenelle Donalda HERO, MD  Active   Multiple Vitamin (MULTIVITAMIN WITH MINERALS) TABS tablet [id number] Yes Take 1 tablet by mouth at bedtime. [provider]  Active Spouse/Significant Other, Pharmacy Records, Self  nitroGLYCERIN  (NITROSTAT ) 0.4 MG SL tablet [id number] Yes Place 0.4 mg under the tongue every 5 (five) minutes as needed for chest pain. [provider]  Active Spouse/Significant Other, Pharmacy Records, Self  simvastatin  (ZOCOR ) 80 MG tablet [id number] Yes Take 1 tablet (80 mg total) by mouth daily. Patient needs an appointment for further refills. 3 rd/final attempt Kennyth Worth HERO, MD  Active Spouse/Significant Other, Pharmacy Records, Self  triamcinolone  ointment (KENALOG ) 0.5 % [id number]  APPLY 1 APPLICATION TOPICALLY 2 (TWO) TIMES DAILY.  Patient not taking: Reported on 08/16/2024   Kennyth Worth HERO, MD  Active Spouse/Significant Other, Pharmacy Records, Self           Med Note [facility], DONETA GORMAN Kitchens Aug 13, 2024 11:51 PM) Kept on hand when needed            Home Care and Equipment/Supplies: Were Home Health Services Ordered?: NA Any new equipment or medical supplies ordered?: NA  Functional Questionnaire: Do you need assistance with bathing/showering or dressing?: No Do you need assistance with meal preparation?: No Do you need assistance with eating?: No Do you have difficulty maintaining continence: No Do you need assistance with getting out  of bed/getting out of a chair/moving?: No Do you have difficulty managing or taking your medications?: No  Follow up appointments reviewed: PCP Follow-up appointment confirmed?: Yes Date of PCP follow-up appointment?:  08/23/24 Follow-up Provider: [facility] Follow-up appointment confirmed?: NA Do you need transportation to your follow-up appointment?: No Do you understand care options if your condition(s) worsen?: Yes-patient verbalized understanding    SIGNATURE Julian Lemmings, LPN [facility] Nurse Health Advisor Direct Dial [phone]

## 2024-08-20 DIAGNOSIS — M25561 Pain in right knee: Secondary | ICD-10-CM | POA: Diagnosis not present

## 2024-08-20 DIAGNOSIS — M1711 Unilateral primary osteoarthritis, right knee: Secondary | ICD-10-CM | POA: Diagnosis not present

## 2024-08-22 NOTE — Progress Notes (Addendum)
 " Cardiology Office Note    Patient Name: Vincent Stevens Date of Encounter: 08/22/2024  Primary Care Provider:  Kennyth Worth HERO, MD Primary Cardiologist:  None Primary Electrophysiologist: None   Past Medical History    Past Medical History:  Diagnosis Date   Allergy    Angina pectoris 11/02/2023   Angina, class III 03/01/2018   BPH (benign prostatic hyperplasia) 09/19/2009   Qualifier: Diagnosis of  By: Jame  MD, Vincent Stevens    Chest pain of uncertain etiology 10/20/2023   Coronary artery disease involving native coronary artery of native heart with angina pectoris    Diabetes mellitus due to underlying condition with unspecified complications (HCC) 09/13/2022   Dyslipidemia 08/14/2010   Qualifier: Diagnosis of  By: Jame  MD, Vincent Stevens    Essential hypertension 01/17/2009   Qualifier: Diagnosis of  By: Jame  MD, Vincent Stevens    GERD (gastroesophageal reflux disease) 09/09/2022   Gout    History of colonic polyps 09/27/2008   Qualifier: Diagnosis of  By: Jame  MD, Vincent Stevens  Initial colonoscopy 2003.  Hyperplastic polyps only Followup colonoscopy 2008.  Normal  10 year interval recommended    Hx of CABG 03/01/2018   Left main coronary artery disease    Melanoma (HCC)    Pain in both lower extremities 08/15/2018   Prediabetes 09/13/2022    History of Present Illness  Vincent Stevens is a 78 y.o. male with a PMH of CAD s/p CABG x 3 in 2019, HTN, HLD, atrial tachycardia who presents today for post ED follow-up.  Mr. Vincent Stevens has a past medical history of CAD with CABG x 3 completed in 2019.  He was seen in follow-up in the ED in 10/20/2020 with complaint of loss of consciousness.  He wore a 7-day event monitor that showed showed no arrhythmia or atrial fibrillation.  He was seen on 10/20/2023 and noted occasional complaints of chest discomfort.  He completed a Lexiscan  Myoview  that was abnormal and underwent left heart cath for further evaluation that showed widely  patent LIMA to LAD, SVG to OM, occlusion of SVG to RCA with symptoms of angina to be related to small vessel disease.  He was started on antianginal therapy with Toprol -XL 25 mg and amlodipine  2.5 mg.  He presented to the ED on 08/12/2024 with loss of consciousness and chest pain.  He reported  left-sided chest pain and took 1 sublingual nitro relief of symptoms and came to the ED for further evaluation.  Initial troponins were negative x 2 with EKG showing no evidence of ACS.  He had a previous ED visit for hypotension and amlodipine  was held.  He was restarted on amlodipine  2.5 mg and Imdur  30 mg was also started. He continued to have bouts of hypotension and orthostasis after reinitiation of amlodipine  and decision was made to discontinue.  He completed CT of the kidneys that showed bilateral nonobstructing renal calculi and treated with ceftriaxone  and 1 L of lactated Ringer 's.  2D echo was completed showing preserved EF of 60 to 65%.  He was continued on Imdur /Toprol -XL with plan for 30-day event monitor as an outpatient to evaluate bradycardia and to rule out possible cardiac cause for presyncope.  [name] presents today with his wife for post hospital follow-up. He continues to experience chest tightness with minimal physical activity, such as walking to the mailbox, which typically resolves with rest. These symptoms have been ongoing since 2023. He recalls undergoing a heart catheterization in  2024, which did not significantly alleviate his symptoms. He was previously on amlodipine , which has been discontinued, and is currently taking isosorbide . He uses nitroglycerin  for breakthrough chest pain, but an episode two weeks ago led to hospitalization after he became unresponsive following nitroglycerin  use. He mentions variable blood pressure readings at home, with some readings being extremely high. His blood pressure has been more stable since the recent medication adjustment, which involved the removal  of amlodipine . No dizziness on recent days when his blood pressure was checked. He has a history of bypass surgery. He has experienced passing out spells, the most recent occurring two weeks ago, which led to a hospital visit. No episodes of chest pain or discomfort while at rest. He is currently taking a long-acting nitroglycerin  and uses nitroglycerin  tablets for breakthrough pain. Patient denies chest pain, palpitations, dyspnea, PND, orthopnea, nausea, vomiting, dizziness, syncope, edema, weight gain, or early satiety.  Discussed the use of AI scribe software for clinical note transcription with the patient, who gave verbal consent to proceed.  History of Present Illness   Review of Systems  Please see the history of present illness.    All other systems reviewed and are otherwise negative except as noted above.  Physical Exam    Wt Readings from Last 3 Encounters:  08/14/24 196 lb 15.7 oz (89.4 kg)  04/10/24 199 lb (90.3 kg)  03/01/24 199 lb (90.3 kg)   CD:Uyzmz were no vitals filed for this visit.,There is no height or weight on file to calculate BMI. GEN: Well nourished, well developed in no acute distress Neck: No JVD; No carotid bruits Pulmonary: Clear to auscultation without rales, wheezing or rhonchi  Cardiovascular: Normal rate. Regular rhythm. Normal S1. Normal S2.   Murmurs: There is no murmur.  ABDOMEN: Soft, non-tender, non-distended EXTREMITIES:  No edema; No deformity   EKG/LABS/ Recent Cardiac Studies   ECG personally reviewed by me today -none completed today  Risk Assessment/Calculations:          Lab Results  Component Value Date   WBC 9.6 08/13/2024   HGB 15.1 08/13/2024   HCT 47.2 08/13/2024   MCV 91.8 08/13/2024   PLT 208 08/13/2024   Lab Results  Component Value Date   CREATININE 1.27 (H) 08/15/2024   BUN 29 (H) 08/15/2024   NA 136 08/15/2024   K 4.6 08/15/2024   CL 105 08/15/2024   CO2 23 08/15/2024   Lab Results  Component Value Date    CHOL 141 03/01/2024   HDL 53 03/01/2024   LDLCALC 74 03/01/2024   TRIG 68 03/01/2024   CHOLHDL 2.7 03/01/2024    Lab Results  Component Value Date   HGBA1C 5.9 (H) 08/15/2024   Assessment & Plan    Assessment & Plan  1.  History of CAD: - s/p CABG x 3 completed in 2019 with most recent ischemic eval ration completed via left heart cath on 11/04/2023 that showed occluded SVG to RCA with 80-90% mid stenosis of superior PDA -Intermittent chest tightness with exertion, consistent with stable angina. Recent catheterization showed graft occlusion. Blood pressure improved post-amlodipine  discontinuation. - Order PET stress test at [facility] to assess microvascular dysfunction and collateral blood flow.  2.  Presyncope/bradycardia: - Patient recently admitted with hypotension related to antianginal medication - He also has a history of bradycardia  - Order 30-day event monitor to evaluate for arrhythmias or heart block. - Per [place] DMV guidelines patient should abstain from driving until results of monitor  are completed and clearance is provided.  3.  Hyperlipidemia: - Patient's last LDL cholesterol was 74 - Continue Zocor  80 mg daily  4.  Essential hypertension: - Blood pressure variable but improved post-amlodipine  discontinuation. - Continue Toprol -XL 25 mg and Imdur  30 mg daily - Monitor blood pressure regularly.  5.  DM type II - Patient's last hemoglobin A1c was 5.9 on 9/17    Disposition: Follow-up with Dr. Edwyna as scheduled Informed Consent   Shared Decision Making/Informed Consent The risks [chest pain, shortness of breath, cardiac arrhythmias, dizziness, blood pressure fluctuations, myocardial infarction, stroke/transient ischemic attack, nausea, vomiting, allergic reaction, radiation exposure, metallic taste sensation and life-threatening complications (estimated to be 1 in 10,000)], benefits (risk stratification, diagnosing coronary artery disease, treatment  guidance) and alternatives of a cardiac PET stress test were discussed in detail with Mr. Disano and he agrees to proceed.      Signed, Wyn Raddle, Jackee Shove, NP 08/22/2024, 6:29 PM [facility] "

## 2024-08-23 ENCOUNTER — Ambulatory Visit: Attending: Nurse Practitioner | Admitting: Nurse Practitioner

## 2024-08-23 ENCOUNTER — Ambulatory Visit: Admitting: Family Medicine

## 2024-08-23 ENCOUNTER — Encounter: Payer: Self-pay | Admitting: Nurse Practitioner

## 2024-08-23 ENCOUNTER — Encounter: Payer: Self-pay | Admitting: Family Medicine

## 2024-08-23 ENCOUNTER — Other Ambulatory Visit: Payer: Self-pay | Admitting: Family Medicine

## 2024-08-23 VITALS — BP 119/73 | HR 64 | Temp 97.2°F | Ht 69.0 in | Wt 184.0 lb

## 2024-08-23 VITALS — BP 132/74 | HR 56 | Ht 70.0 in | Wt 185.0 lb

## 2024-08-23 DIAGNOSIS — J309 Allergic rhinitis, unspecified: Secondary | ICD-10-CM | POA: Diagnosis not present

## 2024-08-23 DIAGNOSIS — I10 Essential (primary) hypertension: Secondary | ICD-10-CM | POA: Diagnosis not present

## 2024-08-23 DIAGNOSIS — N401 Enlarged prostate with lower urinary tract symptoms: Secondary | ICD-10-CM | POA: Diagnosis not present

## 2024-08-23 DIAGNOSIS — E088 Diabetes mellitus due to underlying condition with unspecified complications: Secondary | ICD-10-CM

## 2024-08-23 DIAGNOSIS — E118 Type 2 diabetes mellitus with unspecified complications: Secondary | ICD-10-CM

## 2024-08-23 DIAGNOSIS — E785 Hyperlipidemia, unspecified: Secondary | ICD-10-CM | POA: Diagnosis not present

## 2024-08-23 DIAGNOSIS — Z23 Encounter for immunization: Secondary | ICD-10-CM | POA: Diagnosis not present

## 2024-08-23 DIAGNOSIS — I25119 Atherosclerotic heart disease of native coronary artery with unspecified angina pectoris: Secondary | ICD-10-CM | POA: Diagnosis not present

## 2024-08-23 MED ORDER — BENZONATATE 200 MG PO CAPS: 200.0000 mg | ORAL_CAPSULE | Freq: Two times a day (BID) | ORAL | 0 refills | Status: DC | PRN

## 2024-08-23 MED ORDER — AZELASTINE HCL 0.1 % NA SOLN: 2.0000 | Freq: Two times a day (BID) | NASAL | 12 refills | Status: AC

## 2024-08-23 NOTE — Assessment & Plan Note (Signed)
 He has not had any more hypotensive episodes since being discharged from the hospital.  He is no longer on the amlodipine  or valsartan.  His current regimen is metoprolol  succinate 25 mg daily and Imdur  30 mg daily.  He will follow-up with cardiology later today.  Will not make any adjustments at this time.

## 2024-08-23 NOTE — Progress Notes (Signed)
 Chief Complaint:  LABRADFORD SCHNITKER is a 78 y.o. male who presents today for a TCM visit.  Assessment/Plan:  Chronic Problems Addressed Today: Essential hypertension He has not had any more hypotensive episodes since being discharged from the hospital.  He is no longer on the amlodipine  or valsartan.  His current regimen is metoprolol  succinate 25 mg daily and Imdur  30 mg daily.  He will follow-up with cardiology later today.  Will not make any adjustments at this time.  BPH (benign prostatic hyperplasia) Flomax  was stopped during hospitalization.  He is now on alfuzosin  and Proscar .  Tolerating these well.  He has outpatient follow-up with urology planned.  Allergic rhinitis Patient with intermittent postnasal drip and cough for the last few weeks.  Chest x-ray during hospitalization was normal.  Normal exam today.  Will start Astelin  nasal spray.  Also send in Tessalon  as needed for cough.  He will let us  know if not proving in the next 2 weeks.  Flu vaccine given today.     Subjective:  HPI:  Summary of Hospital admission: Reason for admission: Confusion, dizziness Date of admission: 08/13/2024 Date of discharge: 08/15/2024 Date of Interactive contact: 08/16/2024 Summary of Hospital course: Patient presented to the ED on 08/13/2024 with confusion and dizziness.  This had been going on for about a month. He was admitted for further workup.  Underwent echocardiogram which showed normal EF.  Amlodipine  and losartan  were discontinued.  Flomax  was discontinued.  He was started on alfuzosin  and finasteride . His symptoms improved and he was discharged home.  Interim history:   Discussed the use of AI scribe software for clinical note transcription with the patient, who gave verbal consent to proceed.  History of Present Illness DELRICK DEHART is a 78 year old male who presents with confusion and dizziness.  Since discharge, he reports improvement but continues to experience frequent  urination with minimal output. He has an upcoming appointment with a urologist. His current medications include alfuzosin  and finasteride  for urinary symptoms, and Imdur  and metoprolol  for blood pressure management.  He has done very well.  Not having any further dizzy episodes.  Blood pressure has been better since being discharged from the hospital a week ago.  He also reports a nagging cough, particularly in the morning and at night, accompanied by phlegm and post-nasal drip sensations. No runny nose, but he feels sinus drainage that triggers coughing. He has been using a medication starting with 'PANT' for this, but it has not been effective.     ROS: Per HPI, otherwise a complete review of systems was negative.   PMH:  The following were reviewed and entered/updated in epic: Past Medical History:  Diagnosis Date   Allergy    Angina pectoris 11/02/2023   Angina, class III 03/01/2018   BPH (benign prostatic hyperplasia) 09/19/2009   Qualifier: Diagnosis of  By: Jame  MD, Maude FALCON    Chest pain of uncertain etiology 10/20/2023   Coronary artery disease involving native coronary artery of native heart with angina pectoris    Diabetes mellitus due to underlying condition with unspecified complications (HCC) 09/13/2022   Dyslipidemia 08/14/2010   Qualifier: Diagnosis of  By: Jame  MD, Maude FALCON    Essential hypertension 01/17/2009   Qualifier: Diagnosis of  By: Jame  MD, Maude FALCON    GERD (gastroesophageal reflux disease) 09/09/2022   Gout    History of colonic polyps 09/27/2008   Qualifier: Diagnosis of  By: Kwiatkowski  MD, Maude FALCON  Initial colonoscopy 2003.  Hyperplastic polyps only Followup colonoscopy 2008.  Normal  10 year interval recommended    Hx of CABG 03/01/2018   Left main coronary artery disease    Melanoma (HCC)    Pain in both lower extremities 08/15/2018   Prediabetes 09/13/2022   Patient Active Problem List   Diagnosis Date Noted   Near syncope  08/14/2024   Dysuria 08/14/2024   Sinus bradycardia 08/14/2024   Atherosclerosis of native coronary artery of native heart with stable angina pectoris 08/12/2024   Angina pectoris 11/02/2023   Diabetes mellitus due to underlying condition with unspecified complications (HCC) 09/13/2022   GERD (gastroesophageal reflux disease) 09/09/2022   Allergic rhinitis 06/17/2021   Pain in both lower extremities 08/15/2018   Gout    Melanoma (HCC)    Angina, class III 03/01/2018   Hx of CABG 03/01/2018   Coronary artery disease involving native coronary artery of native heart with angina pectoris    Dyslipidemia 08/14/2010   BPH (benign prostatic hyperplasia) 09/19/2009   Essential hypertension 01/17/2009   History of colonic polyps 09/27/2008   Past Surgical History:  Procedure Laterality Date   CORONARY ARTERY BYPASS GRAFT N/A 03/01/2018   Procedure: CORONARY ARTERY BYPASS GRAFTING (CABG) times three using the right saphaneous vein. Harvested endoscopicly; and left internal mammary artery.;  Surgeon: Lucas Dorise POUR, MD;  Location: [facility] OR;  Service: Open Heart Surgery;  Laterality: N/A;   HERNIA REPAIR     LEFT HEART CATH AND CORONARY ANGIOGRAPHY N/A 03/01/2018   Procedure: LEFT HEART CATH AND CORONARY ANGIOGRAPHY;  Surgeon: Anner Alm ORN, MD;  Location: [facility] INVASIVE CV LAB;  Service: Cardiovascular;  Laterality: N/A;   LEFT HEART CATH AND CORS/GRAFTS ANGIOGRAPHY N/A 11/04/2023   Procedure: LEFT HEART CATH AND CORS/GRAFTS ANGIOGRAPHY;  Surgeon: Ladona Heinz, MD;  Location: [facility] INVASIVE CV LAB;  Service: Cardiovascular;  Laterality: N/A;   melanoma removal     TEE WITHOUT CARDIOVERSION N/A 03/01/2018   Procedure: TRANSESOPHAGEAL ECHOCARDIOGRAM (TEE);  Surgeon: Lucas Dorise POUR, MD;  Location: [facility] OR;  Service: Open Heart Surgery;  Laterality: N/A;    Family History  Problem Relation Age of Onset   Dementia Mother    Heart attack Father    Cancer Sister        Skin   Diabetes Other    Hypertension  Other     Medications- Reconciled discharge and current medications in Epic.  Current Outpatient Medications  Medication Sig Dispense Refill   alfuzosin  (UROXATRAL ) 10 MG 24 hr tablet Take 1 tablet (10 mg total) by mouth at bedtime. 30 tablet 1   aspirin  EC 81 MG tablet Take 1 tablet (81 mg total) by mouth daily.     azelastine  (ASTELIN ) 0.1 % nasal spray Place 2 sprays into both nostrils 2 (two) times daily. 30 mL 12   benzonatate  (TESSALON ) 200 MG capsule Take 1 capsule (200 mg total) by mouth 2 (two) times daily as needed for cough. 20 capsule 0   finasteride  (PROSCAR ) 5 MG tablet Take 1 tablet (5 mg total) by mouth daily. 30 tablet 2   ibuprofen  (ADVIL ,MOTRIN ) 200 MG tablet Take 200 mg by mouth 2 (two) times daily as needed (arthritis apin).      isosorbide  mononitrate (IMDUR ) 30 MG 24 hr tablet Take 1 tablet (30 mg total) by mouth every morning. 30 tablet 2   metFORMIN  (GLUCOPHAGE ) 500 MG tablet Take 500 mg by mouth daily with breakfast.  metoprolol  succinate (TOPROL -XL) 25 MG 24 hr tablet Take 1 tablet (25 mg total) by mouth at bedtime. TAKE WITH OR IMMEDIATELY FOLLOWING A MEAL. 90 tablet 3   Multiple Vitamin (MULTIVITAMIN WITH MINERALS) TABS tablet Take 1 tablet by mouth at bedtime.     simvastatin  (ZOCOR ) 80 MG tablet Take 1 tablet (80 mg total) by mouth daily. Patient needs an appointment for further refills. 3 rd/final attempt 90 tablet 1   triamcinolone  ointment (KENALOG ) 0.5 % APPLY 1 APPLICATION TOPICALLY 2 (TWO) TIMES DAILY. 30 g 5   nitroGLYCERIN  (NITROSTAT ) 0.4 MG SL tablet Place 0.4 mg under the tongue every 5 (five) minutes as needed for chest pain. (Patient not taking: Reported on 08/23/2024)     No current facility-administered medications for this visit.    Allergies-reviewed and updated Allergies  Allergen Reactions   Sulfamethoxazole Hives and Itching    Social History   Socioeconomic History   Marital status: Married    Spouse name: Not on file   Number of  children: 1   Years of education: Not on file   Highest education level: Not on file  Occupational History   Occupation: Courier     Comment: Part time   Tobacco Use   Smoking status: Former    Current packs/day: 0.00    Types: Cigarettes    Quit date: 11/29/1980    Years since quitting: 43.7   Smokeless tobacco: Never  Vaping Use   Vaping status: Never Used  Substance and Sexual Activity   Alcohol use: No   Drug use: No   Sexual activity: Not on file  Other Topics Concern   Not on file  Social History Narrative   Not on file   Social Drivers of Health   Financial Resource Strain: Low Risk  (04/10/2024)   Overall Financial Resource Strain (CARDIA)    Difficulty of Paying Living Expenses: Not hard at all  Food Insecurity: No Food Insecurity (08/14/2024)   Hunger Vital Sign    Worried About Running Out of Food in the Last Year: Never true    Ran Out of Food in the Last Year: Never true  Transportation Needs: No Transportation Needs (08/14/2024)   PRAPARE - Administrator, Civil Service (Medical): No    Lack of Transportation (Non-Medical): No  Physical Activity: Inactive (04/10/2024)   Exercise Vital Sign    Days of Exercise per Week: 0 days    Minutes of Exercise per Session: 0 min  Stress: No Stress Concern Present (04/10/2024)   [employer] of Occupational Health - Occupational Stress Questionnaire    Feeling of Stress : Not at all  Social Connections: Moderately Isolated (08/14/2024)   Social Connection and Isolation Panel    Frequency of Communication with Friends and Family: More than three times a week    Frequency of Social Gatherings with Friends and Family: Three times a week    Attends Religious Services: Never    Active Member of Clubs or Organizations: No    Attends Banker Meetings: Never    Marital Status: Married        Objective:  Physical Exam: BP 119/73   Pulse 64   Temp (!) 97.2 F (36.2 C) (Temporal)   Ht 5' 9  (1.753 m)   Wt 184 lb (83.5 kg)   SpO2 98%   BMI 27.17 kg/m   Gen: NAD, resting comfortably CV: RRR with no murmurs appreciated Pulm: NWOB, CTAB with no crackles, wheezes,  or rhonchi GI: Normal bowel sounds present. Soft, Nontender, Nondistended. MSK: No edema, cyanosis, or clubbing noted Skin: Warm, dry Neuro: Grossly normal, moves all extremities Psych: Normal affect and thought content      Carnella Fryman M. Kennyth, MD 08/23/2024 8:34 AM

## 2024-08-23 NOTE — Patient Instructions (Addendum)
 YOU CAN NOT DRIVE UNTIL WE REVIEW YOUR MONITOR RESULTS AND CLEAR YOU  Medication Instructions:  TAKE 1 NITROGLYCERIN  IF YOU HAVE CHEST PAIN WITH ACTIVITY; IF IT DOES NOT WORK GO TO THE EMERGENCY ROOM  IF YOU EXPERIENCE CHEST PAIN WHILE AT REST THEN GO TO THE EMERGENCY ROOM  *If you need a refill on your cardiac medications before your next appointment, please call your pharmacy*  Lab Work: NONE ORDERED If you have labs (blood work) drawn today and your tests are completely normal, you will receive your results only by: MyChart Message (if you have MyChart) OR A paper copy in the mail If you have any lab test that is abnormal or we need to change your treatment, we will call you to review the results.  Testing/Procedures: CARDIAC PET STRESS TEST  Your physician has recommended that you wear an 30 DAY event monitor. Event monitors are medical devices that record the heart's electrical activity. Doctors most often us  these monitors to diagnose arrhythmias. Arrhythmias are problems with the speed or rhythm of the heartbeat. The monitor is a small, portable device. You can wear one while you do your normal daily activities. This is usually used to diagnose what is causing palpitations/syncope (passing out).  Follow-Up: At [facility], you and your health needs are our priority.  As part of our continuing mission to provide you with exceptional heart care, our providers are all part of one team.  This team includes your primary Cardiologist (physician) and Advanced Practice Providers or APPs (Physician Assistants and Nurse Practitioners) who all work together to provide you with the care you need, when you need it.  Your next appointment:   FOLLOW UP AS SCHEDULED   Provider:   Jennifer Crape, MD    We recommend signing up for the patient portal called MyChart.  Sign up information is provided on this After Visit Summary.  MyChart is used to connect with patients for Virtual Visits  (Telemedicine).  Patients are able to view lab/test results, encounter notes, upcoming appointments, etc.  Non-urgent messages can be sent to your provider as well.   To learn more about what you can do with MyChart, go to [email or web address].   Other Instructions    Please report to Radiology at the [facility] Main Entrance 30 minutes early for your test.  [address] [place], [place] [address]               How to Prepare for Your Cardiac PET/CT Stress Test:  Nothing to eat or drink, except water, 3 hours prior to arrival time.  NO caffeine/decaffeinated products, or chocolate 12 hours prior to arrival. (Please note decaffeinated beverages (teas/coffees) still contain caffeine).  If you have caffeine within 12 hours prior, the test will need to be rescheduled.  Medication instructions: Do not take erectile dysfunction medications for 72 hours prior to test (sildenafil, tadalafil) Do not take nitrates (isosorbide  mononitrate, Ranexa) the day before or day of test Do not take tamsulosin  the day before or morning of test Hold theophylline containing medications for 12 hours. Hold Dipyridamole 48 hours prior to the test.  Diabetic Preparation: If able to eat breakfast prior to 3 hour fasting, you may take all medications, including your insulin . Do not worry if you miss your breakfast dose of insulin  - start at your next meal. If you do not eat prior to 3 hour fast-Hold all diabetes (oral and insulin ) medications. Patients who wear a continuous glucose  monitor MUST remove the device prior to scanning.  You may take your remaining medications with water.  NO perfume, cologne or lotion on chest or abdomen area.  Total time is 1 to 2 hours; you may want to bring reading material for the waiting time.   In preparation for your appointment, medication and supplies will be purchased.  Appointment availability is limited, so if you need to cancel or reschedule, please  call the Radiology Department Scheduler at [phone] 24 hours in advance to avoid a cancellation fee of $100.00  What to Expect When you Arrive:  Once you arrive and check in for your appointment, you will be taken to a preparation room within the Radiology Department.  A technologist or Nurse will obtain your medical history, verify that you are correctly prepped for the exam, and explain the procedure.  Afterwards, an IV will be started in your arm and electrodes will be placed on your skin for EKG monitoring during the stress portion of the exam. Then you will be escorted to the PET/CT scanner.  There, staff will get you positioned on the scanner and obtain a blood pressure and EKG.  During the exam, you will continue to be connected to the EKG and blood pressure machines.  A small, safe amount of a radioactive tracer will be injected in your IV to obtain a series of pictures of your heart along with an injection of a stress agent.    After your Exam:  It is recommended that you eat a meal and drink a caffeinated beverage to counter act any effects of the stress agent.  Drink plenty of fluids for the remainder of the day and urinate frequently for the first couple of hours after the exam.  Your doctor will inform you of your test results within 7-10 business days.  For more information and frequently asked questions, please visit our website: [email or web address]  For questions about your test or how to prepare for your test, please call: Cardiac Imaging Nurse Navigators Office: [phone]

## 2024-08-23 NOTE — Patient Instructions (Signed)
 It was very nice to see you today!  VISIT SUMMARY: You visited us  today due to confusion and dizziness. We reviewed your recent hospital stay and adjusted medications, which have improved your symptoms. We also discussed your ongoing issues with frequent urination, chronic cough, and memory problems.  YOUR PLAN: HYPOTENSION:  -Continue taking Imdur  and metoprolol  as prescribed. -Follow up with a cardiologist for further blood pressure management.   BENIGN PROSTATIC HYPERPLASIA WITH LOWER URINARY TRACT SYMPTOMS: You have frequent urination and difficulty voiding. -Continue taking alfuzosin  and finasteride  as prescribed. -Follow up with a urologist on September 06, 2024.  CHRONIC COUGH DUE TO POSTNASAL DRIP AND CHRONIC RHINITIS: You have a chronic cough with phlegm, likely due to postnasal drip. -Use the prescribed nasal spray for postnasal drip. -Take Tessalon  for cough as prescribed.  Return if symptoms worsen or fail to improve.   Take care, Dr Kennyth  PLEASE NOTE:  If you had any lab tests, please let us  know if you have not heard back within a few days. You may see your results on mychart before we have a chance to review them but we will give you a call once they are reviewed by us .   If we ordered any referrals today, please let us  know if you have not heard from their office within the next week.   If you had any urgent prescriptions sent in today, please check with the pharmacy within an hour of our visit to make sure the prescription was transmitted appropriately.   Please try these tips to maintain a healthy lifestyle:  Eat at least 3 REAL meals and 1-2 snacks per day.  Aim for no more than 5 hours between eating.  If you eat breakfast, please do so within one hour of getting up.   Each meal should contain half fruits/vegetables, one quarter protein, and one quarter carbs (no bigger than a computer mouse)  Cut down on sweet beverages. This includes juice, soda, and sweet  tea.   Drink at least 1 glass of water with each meal and aim for at least 8 glasses per day  Exercise at least 150 minutes every week.

## 2024-08-23 NOTE — Assessment & Plan Note (Signed)
 Patient with intermittent postnasal drip and cough for the last few weeks.  Chest x-ray during hospitalization was normal.  Normal exam today.  Will start Astelin  nasal spray.  Also send in Tessalon  as needed for cough.  He will let us  know if not proving in the next 2 weeks.

## 2024-08-23 NOTE — Assessment & Plan Note (Signed)
 Flomax  was stopped during hospitalization.  He is now on alfuzosin  and Proscar .  Tolerating these well.  He has outpatient follow-up with urology planned.

## 2024-08-27 ENCOUNTER — Telehealth (HOSPITAL_COMMUNITY): Payer: Self-pay | Admitting: *Deleted

## 2024-08-27 ENCOUNTER — Encounter (HOSPITAL_COMMUNITY): Payer: Self-pay

## 2024-08-27 ENCOUNTER — Telehealth: Payer: Self-pay | Admitting: *Deleted

## 2024-08-27 DIAGNOSIS — M1711 Unilateral primary osteoarthritis, right knee: Secondary | ICD-10-CM | POA: Diagnosis not present

## 2024-08-27 NOTE — Telephone Encounter (Signed)
 Reaching out to patient to offer assistance regarding upcoming cardiac imaging study; pt verbalizes understanding of appt date/time, parking situation and where to check in, pre-test NPO status; name and call back number provided for further questions should they arise  Chantal Requena RN Navigator Cardiac Imaging [facility] [phone] office [phone] cell  Patient aware to avoid caffeine and imdur  prior to his cardiac PET scan.

## 2024-08-27 NOTE — Telephone Encounter (Signed)
 Pharmacy requesting Rx Metformin  last refill by  Ladona Heinz, MD

## 2024-08-28 ENCOUNTER — Other Ambulatory Visit: Payer: Self-pay | Admitting: *Deleted

## 2024-08-28 ENCOUNTER — Ambulatory Visit: Payer: Self-pay | Admitting: Nurse Practitioner

## 2024-08-28 ENCOUNTER — Telehealth: Payer: Self-pay | Admitting: Cardiology

## 2024-08-28 ENCOUNTER — Encounter (HOSPITAL_COMMUNITY)
Admission: RE | Admit: 2024-08-28 | Discharge: 2024-08-28 | Disposition: A | Discharge status: Home / Self Care | Source: Ambulatory Visit | Attending: Nurse Practitioner | Admitting: Nurse Practitioner

## 2024-08-28 DIAGNOSIS — E118 Type 2 diabetes mellitus with unspecified complications: Secondary | ICD-10-CM | POA: Insufficient documentation

## 2024-08-28 DIAGNOSIS — I10 Essential (primary) hypertension: Secondary | ICD-10-CM | POA: Insufficient documentation

## 2024-08-28 DIAGNOSIS — E785 Hyperlipidemia, unspecified: Secondary | ICD-10-CM | POA: Insufficient documentation

## 2024-08-28 DIAGNOSIS — E088 Diabetes mellitus due to underlying condition with unspecified complications: Secondary | ICD-10-CM | POA: Insufficient documentation

## 2024-08-28 DIAGNOSIS — I25119 Atherosclerotic heart disease of native coronary artery with unspecified angina pectoris: Secondary | ICD-10-CM | POA: Insufficient documentation

## 2024-08-28 LAB — NM PET CT CARDIAC PERFUSION MULTI W/ABSOLUTE BLOODFLOW
LV dias vol: 100 mL (ref 62–150)
LV sys vol: 52 mL (ref 4.2–5.8)
MBFR: 1.41
Nuc Rest EF: 48 %
Nuc Stress EF: 52 %
Rest MBF: 0.69 ml/g/min
Rest Nuclear Isotope Dose: 21.8 mCi
ST Depression (mm): 0 mm
Stress MBF: 0.97 ml/g/min
Stress Nuclear Isotope Dose: 21.8 mCi

## 2024-08-28 MED ORDER — RUBIDIUM RB82 GENERATOR (RUBYFILL)
21.7600 | PACK | Freq: Once | INTRAVENOUS | Status: AC
  Administered 2024-08-28: 21.76 via INTRAVENOUS

## 2024-08-28 MED ORDER — METFORMIN HCL 500 MG PO TABS: 500.0000 mg | ORAL_TABLET | Freq: Every day | ORAL | 1 refills | Status: DC

## 2024-08-28 MED ORDER — REGADENOSON 0.4 MG/5ML IV SOLN
0.4000 mg | Freq: Once | INTRAVENOUS | Status: AC
  Administered 2024-08-28: 0.4 mg via INTRAVENOUS

## 2024-08-28 MED ORDER — RUBIDIUM RB82 GENERATOR (RUBYFILL)
21.7500 | PACK | Freq: Once | INTRAVENOUS | Status: AC
  Administered 2024-08-28: 21.75 via INTRAVENOUS

## 2024-08-28 MED ORDER — REGADENOSON 0.4 MG/5ML IV SOLN
INTRAVENOUS | Status: AC
  Filled 2024-08-28: qty 5

## 2024-08-28 NOTE — Progress Notes (Signed)
 Pt toelrated lexiscan . SOB at start of scan, resolved quickly per pt. Pt reported having chest tightness throughout scan, causing significant discomfort. At end of scan, pt stated chest tightness is still present and improving. Pt stated this sort of tightness occurs with exercise and it is not uncommon to take time to resolve with rest. Pt given PO caffeine. Pt stated feeling comfortable to return to waiting room with wife. Pt advised to seek medical assistance if tightness worsens or other symptoms arise. All questions answered by this RN and understanding verbalized.

## 2024-08-28 NOTE — Telephone Encounter (Signed)
 Rx send to [facility]

## 2024-08-28 NOTE — Telephone Encounter (Signed)
 Wife Vincent Stevens) is holding to speak with you as patient's heart monitor has still not arrived. Wife wants advice on next steps.

## 2024-08-28 NOTE — Telephone Encounter (Signed)
 Ok with me. Please place any necessary orders.

## 2024-08-30 DIAGNOSIS — R3914 Feeling of incomplete bladder emptying: Secondary | ICD-10-CM | POA: Diagnosis not present

## 2024-08-30 DIAGNOSIS — N401 Enlarged prostate with lower urinary tract symptoms: Secondary | ICD-10-CM | POA: Diagnosis not present

## 2024-08-30 DIAGNOSIS — N400 Enlarged prostate without lower urinary tract symptoms: Secondary | ICD-10-CM | POA: Diagnosis not present

## 2024-08-30 DIAGNOSIS — R351 Nocturia: Secondary | ICD-10-CM | POA: Diagnosis not present

## 2024-08-30 DIAGNOSIS — R350 Frequency of micturition: Secondary | ICD-10-CM | POA: Diagnosis not present

## 2024-08-30 DIAGNOSIS — R399 Unspecified symptoms and signs involving the genitourinary system: Secondary | ICD-10-CM | POA: Diagnosis not present

## 2024-08-30 DIAGNOSIS — R3912 Poor urinary stream: Secondary | ICD-10-CM | POA: Diagnosis not present

## 2024-09-03 DIAGNOSIS — M25561 Pain in right knee: Secondary | ICD-10-CM | POA: Diagnosis not present

## 2024-09-03 DIAGNOSIS — M1711 Unilateral primary osteoarthritis, right knee: Secondary | ICD-10-CM | POA: Diagnosis not present

## 2024-09-05 ENCOUNTER — Ambulatory Visit: Admitting: Cardiology

## 2024-09-13 ENCOUNTER — Encounter: Payer: Self-pay | Admitting: Family Medicine

## 2024-09-13 ENCOUNTER — Ambulatory Visit: Payer: Medicare HMO | Admitting: Family Medicine

## 2024-09-13 VITALS — BP 138/78 | HR 53 | Temp 97.3°F | Ht 70.0 in | Wt 185.6 lb

## 2024-09-13 DIAGNOSIS — E088 Diabetes mellitus due to underlying condition with unspecified complications: Secondary | ICD-10-CM

## 2024-09-13 DIAGNOSIS — C439 Malignant melanoma of skin, unspecified: Secondary | ICD-10-CM | POA: Insufficient documentation

## 2024-09-13 DIAGNOSIS — N401 Enlarged prostate with lower urinary tract symptoms: Secondary | ICD-10-CM

## 2024-09-13 DIAGNOSIS — I152 Hypertension secondary to endocrine disorders: Secondary | ICD-10-CM | POA: Diagnosis not present

## 2024-09-13 DIAGNOSIS — Z0001 Encounter for general adult medical examination with abnormal findings: Secondary | ICD-10-CM

## 2024-09-13 DIAGNOSIS — I2510 Atherosclerotic heart disease of native coronary artery without angina pectoris: Secondary | ICD-10-CM | POA: Insufficient documentation

## 2024-09-13 DIAGNOSIS — E785 Hyperlipidemia, unspecified: Secondary | ICD-10-CM

## 2024-09-13 DIAGNOSIS — Z0000 Encounter for general adult medical examination without abnormal findings: Secondary | ICD-10-CM

## 2024-09-13 DIAGNOSIS — T7840XA Allergy, unspecified, initial encounter: Secondary | ICD-10-CM | POA: Insufficient documentation

## 2024-09-13 DIAGNOSIS — E1169 Type 2 diabetes mellitus with other specified complication: Secondary | ICD-10-CM | POA: Diagnosis not present

## 2024-09-13 DIAGNOSIS — E1159 Type 2 diabetes mellitus with other circulatory complications: Secondary | ICD-10-CM

## 2024-09-13 NOTE — Assessment & Plan Note (Signed)
 He is doing well with current regimen per cardiology with metoprolol  succinate 25 mg daily and Imdur  30 mg daily.

## 2024-09-13 NOTE — Patient Instructions (Signed)
 It was very nice to see you today!  VISIT SUMMARY: Today, you had your annual physical exam and follow-up on your blood pressure management. We discussed your current health status, including your blood pressure, heart monitoring, and recent PET scan experience. We also reviewed your knee pain, physical activity, and diet.  YOUR PLAN: ADULT WELLNESS VISIT: You are in stable health with controlled blood pressure and regular physical activity. -Schedule a follow-up in six months for a routine check-up and A1c evaluation. -Continue your physical activity and work on improving your diet.  ESSENTIAL HYPERTENSION: Your blood pressure is well-controlled with medication, though you experience occasional low blood pressure episodes causing exhaustion. -Continue taking your current blood pressure medications as prescribed. -Monitor your blood pressure regularly. -Follow up with your cardiologist as scheduled for any necessary medication adjustments.  GENERAL HEALTH MAINTENANCE: Your immunizations are up to date, including the shingles vaccine. -No further colonoscopy is needed. -Ensure that the records of your second shingles vaccine are updated.  Return in about 6 months (around 03/14/2025).   Take care, Dr Kennyth  PLEASE NOTE:  If you had any lab tests, please let us  know if you have not heard back within a few days. You may see your results on mychart before we have a chance to review them but we will give you a call once they are reviewed by us .   If we ordered any referrals today, please let us  know if you have not heard from their office within the next week.   If you had any urgent prescriptions sent in today, please check with the pharmacy within an hour of our visit to make sure the prescription was transmitted appropriately.   Please try these tips to maintain a healthy lifestyle:  Eat at least 3 REAL meals and 1-2 snacks per day.  Aim for no more than 5 hours between eating.  If you  eat breakfast, please do so within one hour of getting up.   Each meal should contain half fruits/vegetables, one quarter protein, and one quarter carbs (no bigger than a computer mouse)  Cut down on sweet beverages. This includes juice, soda, and sweet tea.   Drink at least 1 glass of water with each meal and aim for at least 8 glasses per day  Exercise at least 150 minutes every week.

## 2024-09-13 NOTE — Assessment & Plan Note (Signed)
 Follows with cardiology.  Had lipid panel done 6 months ago.  He is on simvastatin  80 mg daily.  We discussed rechecking today however he declined.  Recheck next blood draw.

## 2024-09-13 NOTE — Assessment & Plan Note (Signed)
 Stable on alfuzosin  and Proscar .  Continue management per urology.

## 2024-09-13 NOTE — Assessment & Plan Note (Signed)
 Last A1c at goal on metformin  500 mg daily.  This was last checked last month-do not need to repeat today.  Recheck in 3 to 6 months.

## 2024-09-13 NOTE — Progress Notes (Signed)
 Chief Complaint:  Vincent Stevens is a 78 y.o. male who presents today for his annual comprehensive physical exam.    Assessment/Plan:  Chronic Problems Addressed Today: Dyslipidemia associated with type 2 diabetes mellitus (HCC) Follows with cardiology.  Had lipid panel done 6 months ago.  He is on simvastatin  80 mg daily.  We discussed rechecking today however he declined.  Recheck next blood draw.  Hypertension associated with diabetes (HCC) He is doing well with current regimen per cardiology with metoprolol  succinate 25 mg daily and Imdur  30 mg daily.  Diabetes mellitus due to underlying condition with unspecified complications (HCC) Last A1c at goal on metformin  500 mg daily.  This was last checked last month-do not need to repeat today.  Recheck in 3 to 6 months.  BPH (benign prostatic hyperplasia) Stable on alfuzosin  and Proscar .  Continue management per urology.   Preventative Healthcare: Patient up-to-date on vaccines.  Declined labs for today.  Recheck next office visit.  No longer needs colon cancer screening due to age.  Patient Counseling(The following topics were reviewed and/or handout was given):  -Nutrition: Stressed importance of moderation in sodium/caffeine intake, saturated fat and cholesterol, caloric balance, sufficient intake of fresh fruits, vegetables, and fiber.  -Stressed the importance of regular exercise.   -Substance Abuse: Discussed cessation/primary prevention of tobacco, alcohol, or other drug use; driving or other dangerous activities under the influence; availability of treatment for abuse.   -Injury prevention: Discussed safety belts, safety helmets, smoke detector, smoking near bedding or upholstery.   -Sexuality: Discussed sexually transmitted diseases, partner selection, use of condoms, avoidance of unintended pregnancy and contraceptive alternatives.   -Dental health: Discussed importance of regular tooth brushing, flossing, and dental  visits.  -Health maintenance and immunizations reviewed. Please refer to Health maintenance section.  Return to care in 1 year for next preventative visit.     Subjective:  HPI:  He has no acute complaints today. Patient is here today for his annual physical.  See assessment / plan for status of chronic conditions.  Discussed the use of AI scribe software for clinical note transcription with the patient, who gave verbal consent to proceed.  History of Present Illness Vincent Stevens is a 78 year old male who presents for an annual physical exam and follow-up on his blood pressure management.  He is currently using a heart monitor, which will be removed at the end of the month, and plans to follow up with his cardiologist next week to discuss the results of the heart monitor and a recent PET perfusion scan. He describes the PET scan as painful and uncomfortable, feeling like a heart attack during the procedure.   He has received gel injections in his knees, which provided some relief. He experienced leg pain, attributed to a muscle strain, which is improving over time. He stays active with leg exercises and attempts to maintain a healthy diet, though he acknowledges room for improvement.  He has received both doses of the shingles vaccine, though the second dose [facility] not reported [facility]. He is unable to perform certain activities, such as cutting grass, and is awaiting further evaluation from his cardiologist.       09/13/2024    8:45 AM  Depression screen PHQ 2/9  Decreased Interest 0  Down, Depressed, Hopeless 0  PHQ - 2 Score 0    Health Maintenance Due  Topic Date Due   FOOT EXAM  Never done   OPHTHALMOLOGY EXAM  Never done  Diabetic kidney evaluation - Urine ACR  Never done     ROS: Per HPI, otherwise a complete review of systems was negative.   PMH:  The following were reviewed and entered/updated in epic: Past Medical History:  Diagnosis Date   Allergic rhinitis  06/17/2021   Allergy    Angina pectoris 11/02/2023   Angina, class III 03/01/2018   Arthralgia of right knee 08/07/2024   Atherosclerosis of native coronary artery of native heart with stable angina pectoris 08/12/2024   BPH (benign prostatic hyperplasia) 09/19/2009   Qualifier: Diagnosis of  By: Jame  MD, Maude FALCON    Chest pain of uncertain etiology 10/20/2023   Coronary artery disease involving native coronary artery of native heart with angina pectoris    Diabetes mellitus due to underlying condition with unspecified complications (HCC) 09/13/2022   Dyslipidemia 08/14/2010   Qualifier: Diagnosis of  By: Jame  MD, Maude FALCON    Dyslipidemia associated with type 2 diabetes mellitus (HCC) 08/14/2010   Lab Results      Component    Value    Date           CHOL    134    04/19/2018           HDL    43    04/19/2018           LDLCALC    70    04/19/2018           TRIG    105    04/19/2018           CHOLHDL    3.1    04/19/2018           Dysuria 08/14/2024   Essential hypertension 01/17/2009   Qualifier: Diagnosis of  By: Jame  MD, Maude FALCON    GERD (gastroesophageal reflux disease) 09/09/2022   Gout    History of colonic polyps 09/27/2008   Qualifier: Diagnosis of  By: Jame  MD, Maude FALCON  Initial colonoscopy 2003.  Hyperplastic polyps only Followup colonoscopy 2008.  Normal  10 year interval recommended    Hx of CABG 03/01/2018   Left main coronary artery disease    Melanoma (HCC)    Near syncope 08/14/2024   Osteoarthritis of knee 08/07/2024   Pain in both lower extremities 08/15/2018   Prediabetes 09/13/2022   Sinus bradycardia 08/14/2024   Patient Active Problem List   Diagnosis Date Noted   Left main coronary artery disease    Near syncope 08/14/2024   Dysuria 08/14/2024   Sinus bradycardia 08/14/2024   Atherosclerosis of native coronary artery of native heart with stable angina pectoris 08/12/2024   Arthralgia of right knee 08/07/2024   Osteoarthritis of  knee 08/07/2024   Angina pectoris 11/02/2023   Diabetes mellitus due to underlying condition with unspecified complications (HCC) 09/13/2022   GERD (gastroesophageal reflux disease) 09/09/2022   Allergic rhinitis 06/17/2021   Pain in both lower extremities 08/15/2018   Gout    Angina, class III 03/01/2018   Hx of CABG 03/01/2018   Coronary artery disease involving native coronary artery of native heart with angina pectoris    Dyslipidemia associated with type 2 diabetes mellitus (HCC) 08/14/2010   BPH (benign prostatic hyperplasia) 09/19/2009   Hypertension associated with diabetes (HCC) 01/17/2009   History of colonic polyps 09/27/2008   Past Surgical History:  Procedure Laterality Date   CORONARY ARTERY BYPASS GRAFT N/A 03/01/2018   Procedure: CORONARY ARTERY BYPASS GRAFTING (CABG) times  three using the right saphaneous vein. Harvested endoscopicly; and left internal mammary artery.;  Surgeon: Lucas Dorise POUR, MD;  Location: [facility] OR;  Service: Open Heart Surgery;  Laterality: N/A;   HERNIA REPAIR     LEFT HEART CATH AND CORONARY ANGIOGRAPHY N/A 03/01/2018   Procedure: LEFT HEART CATH AND CORONARY ANGIOGRAPHY;  Surgeon: Anner Alm ORN, MD;  Location: [facility] INVASIVE CV LAB;  Service: Cardiovascular;  Laterality: N/A;   LEFT HEART CATH AND CORS/GRAFTS ANGIOGRAPHY N/A 11/04/2023   Procedure: LEFT HEART CATH AND CORS/GRAFTS ANGIOGRAPHY;  Surgeon: Ladona Heinz, MD;  Location: [facility] INVASIVE CV LAB;  Service: Cardiovascular;  Laterality: N/A;   melanoma removal     TEE WITHOUT CARDIOVERSION N/A 03/01/2018   Procedure: TRANSESOPHAGEAL ECHOCARDIOGRAM (TEE);  Surgeon: Lucas Dorise POUR, MD;  Location: [facility] OR;  Service: Open Heart Surgery;  Laterality: N/A;    Family History  Problem Relation Age of Onset   Dementia Mother    Heart attack Father    Cancer Sister        Skin   Diabetes Other    Hypertension Other     Medications- reviewed and updated Current Outpatient Medications  Medication Sig  Dispense Refill   alfuzosin  (UROXATRAL ) 10 MG 24 hr tablet Take 1 tablet (10 mg total) by mouth at bedtime. 30 tablet 1   aspirin  EC 81 MG tablet Take 1 tablet (81 mg total) by mouth daily.     azelastine  (ASTELIN ) 0.1 % nasal spray Place 2 sprays into both nostrils 2 (two) times daily. 30 mL 12   benzonatate  (TESSALON ) 200 MG capsule Take 1 capsule (200 mg total) by mouth 2 (two) times daily as needed for cough. 20 capsule 0   finasteride  (PROSCAR ) 5 MG tablet Take 1 tablet (5 mg total) by mouth daily. 30 tablet 2   ibuprofen  (ADVIL ,MOTRIN ) 200 MG tablet Take 200 mg by mouth 2 (two) times daily as needed (arthritis apin).      isosorbide  mononitrate (IMDUR ) 30 MG 24 hr tablet Take 1 tablet (30 mg total) by mouth every morning. 30 tablet 2   metFORMIN  (GLUCOPHAGE ) 500 MG tablet Take 1 tablet (500 mg total) by mouth daily with breakfast. 90 tablet 1   metoprolol  succinate (TOPROL -XL) 25 MG 24 hr tablet Take 1 tablet (25 mg total) by mouth at bedtime. TAKE WITH OR IMMEDIATELY FOLLOWING A MEAL. 90 tablet 3   Multiple Vitamin (MULTIVITAMIN WITH MINERALS) TABS tablet Take 1 tablet by mouth at bedtime.     nitroGLYCERIN  (NITROSTAT ) 0.4 MG SL tablet Place 0.4 mg under the tongue every 5 (five) minutes as needed for chest pain.     simvastatin  (ZOCOR ) 80 MG tablet TAKE 1 TABLET DAILY. PATIENT NEEDS AN APPOINTMENT FOR FURTHER REFILLS. 3 RD/FINAL ATTEMPT 90 tablet 1   triamcinolone  ointment (KENALOG ) 0.5 % APPLY 1 APPLICATION TOPICALLY 2 (TWO) TIMES DAILY. 30 g 5   No current facility-administered medications for this visit.    Allergies-reviewed and updated Allergies  Allergen Reactions   Sulfamethoxazole Hives and Itching    Social History   Socioeconomic History   Marital status: Married    Spouse name: Not on file   Number of children: 1   Years of education: Not on file   Highest education level: Not on file  Occupational History   Occupation: Courier     Comment: Part time   Tobacco  Use   Smoking status: Former    Current packs/day: 0.00    Types: Cigarettes  Quit date: 11/29/1980    Years since quitting: 43.8   Smokeless tobacco: Never  Vaping Use   Vaping status: Never Used  Substance and Sexual Activity   Alcohol use: No   Drug use: No   Sexual activity: Not on file  Other Topics Concern   Not on file  Social History Narrative   Not on file   Social Drivers of Health   Financial Resource Strain: Low Risk  (04/10/2024)   Overall Financial Resource Strain (CARDIA)    Difficulty of Paying Living Expenses: Not hard at all  Food Insecurity: No Food Insecurity (08/14/2024)   Hunger Vital Sign    Worried About Running Out of Food in the Last Year: Never true    Ran Out of Food in the Last Year: Never true  Transportation Needs: No Transportation Needs (08/14/2024)   PRAPARE - Administrator, Civil Service (Medical): No    Lack of Transportation (Non-Medical): No  Physical Activity: Inactive (04/10/2024)   Exercise Vital Sign    Days of Exercise per Week: 0 days    Minutes of Exercise per Session: 0 min  Stress: No Stress Concern Present (04/10/2024)   [employer] of Occupational Health - Occupational Stress Questionnaire    Feeling of Stress : Not at all  Social Connections: Moderately Isolated (08/14/2024)   Social Connection and Isolation Panel    Frequency of Communication with Friends and Family: More than three times a week    Frequency of Social Gatherings with Friends and Family: Three times a week    Attends Religious Services: Never    Active Member of Clubs or Organizations: No    Attends Engineer, structural: Never    Marital Status: Married        Objective:  Physical Exam: BP 138/78   Pulse (!) 53   Temp (!) 97.3 F (36.3 C) (Temporal)   Ht 5' 10 (1.778 m)   Wt 185 lb 9.6 oz (84.2 kg)   SpO2 98%   BMI 26.63 kg/m   Body mass index is 26.63 kg/m. Wt Readings from Last 3 Encounters:  09/13/24 185 lb 9.6 oz  (84.2 kg)  08/23/24 185 lb (83.9 kg)  08/23/24 184 lb (83.5 kg)   Gen: NAD, resting comfortably HEENT: TMs normal bilaterally. OP clear. No thyromegaly noted.  CV: RRR with no murmurs appreciated Pulm: NWOB, CTAB with no crackles, wheezes, or rhonchi GI: Normal bowel sounds present. Soft, Nontender, Nondistended. MSK: no edema, cyanosis, or clubbing noted Skin: warm, dry Neuro: CN2-12 grossly intact. Strength 5/5 in upper and lower extremities. Reflexes symmetric and intact bilaterally.  Psych: Normal affect and thought content     Daekwon Beswick M. Kennyth, MD 09/13/2024 9:25 AM

## 2024-09-21 ENCOUNTER — Ambulatory Visit: Attending: Cardiology | Admitting: Cardiology

## 2024-09-21 ENCOUNTER — Encounter: Payer: Self-pay | Admitting: Cardiology

## 2024-09-21 VITALS — BP 110/66 | HR 66 | Ht 70.0 in | Wt 183.8 lb

## 2024-09-21 DIAGNOSIS — I209 Angina pectoris, unspecified: Secondary | ICD-10-CM

## 2024-09-21 DIAGNOSIS — E1159 Type 2 diabetes mellitus with other circulatory complications: Secondary | ICD-10-CM | POA: Diagnosis not present

## 2024-09-21 DIAGNOSIS — Z951 Presence of aortocoronary bypass graft: Secondary | ICD-10-CM

## 2024-09-21 DIAGNOSIS — I25119 Atherosclerotic heart disease of native coronary artery with unspecified angina pectoris: Secondary | ICD-10-CM | POA: Diagnosis not present

## 2024-09-21 DIAGNOSIS — E782 Mixed hyperlipidemia: Secondary | ICD-10-CM | POA: Insufficient documentation

## 2024-09-21 DIAGNOSIS — E088 Diabetes mellitus due to underlying condition with unspecified complications: Secondary | ICD-10-CM

## 2024-09-21 DIAGNOSIS — I152 Hypertension secondary to endocrine disorders: Secondary | ICD-10-CM

## 2024-09-21 HISTORY — DX: Mixed hyperlipidemia: E78.2

## 2024-09-21 MED ORDER — RANOLAZINE ER 1000 MG PO TB12: 1000.0000 mg | ORAL_TABLET | Freq: Two times a day (BID) | ORAL | 3 refills | Status: DC

## 2024-09-21 MED ORDER — RANOLAZINE ER 500 MG PO TB12: 500.0000 mg | ORAL_TABLET | Freq: Two times a day (BID) | ORAL | 0 refills | Status: DC

## 2024-09-21 NOTE — Progress Notes (Signed)
 Cardiology Office Note:    Date:  09/21/2024   [name], DOB Feb 22, 1946, MRN [id number]  PCP:  Vincent Worth HERO, MD  Cardiologist:  Vincent JONELLE Crape, MD   Referring MD: Vincent Worth HERO, MD    ASSESSMENT:    1. Coronary artery disease involving native coronary artery of native heart with angina pectoris   2. Hx of CABG   3. Angina pectoris   4. Hypertension associated with diabetes (HCC)   5. Diabetes mellitus due to underlying condition with unspecified complications (HCC)   6. Mixed dyslipidemia    PLAN:    In order of problems listed above:  Coronary artery disease: Stable angina pectoris: Results of stress test discussed with the patient and wife at extensive length and questions were answered to their satisfaction.  I will initiate Ranexa starting with 500 mg twice daily for 2 weeks and then 1000 mg twice daily.  I recommended going back to the cardiac rehab program and they are agreeable. Essential hypertension: Blood pressure stable and diet was emphasized. Mixed dyslipidemia: On lipid-lowering medications followed by primary care.  Lipids reviewed. Diabetes mellitus: Diet emphasized.  He is doing well with this. Patient will be seen in follow-up appointment in 6 months or earlier if the patient has any concerns.    Medication Adjustments/Labs and Tests Ordered: Current medicines are reviewed at length with the patient today.  Concerns regarding medicines are outlined above.  Orders Placed This Encounter  Procedures   AMB referral to cardiac rehabilitation   Meds ordered this encounter  Medications   ranolazine (RANEXA) 500 MG 12 hr tablet    Sig: Take 1 tablet (500 mg total) by mouth 2 (two) times daily.    Dispense:  28 tablet    Refill:  0   ranolazine (RANEXA) 1000 MG SR tablet    Sig: Take 1 tablet (1,000 mg total) by mouth 2 (two) times daily.    Dispense:  180 tablet    Refill:  3    Do not start 1000 mg until initial 2 weeks of 500 mg dose.      No chief complaint on file.    History of Present Illness:    Vincent Stevens is a 78 y.o. male.  Patient has past medical history of coronary artery disease post CABG surgery, essential hypertension, mixed dyslipidemia and diabetes mellitus.  He denies any problems at this time and takes care of activities of daily living.  No chest pain orthopnea or PND.  He is stress test is intermediate risks.  I reviewed coronary angiography report from late last year.  At the time of my evaluation, the patient is alert awake oriented and in no distress.  He is accompanied by his wife.  Past Medical History:  Diagnosis Date   Allergic rhinitis 06/17/2021   Allergy    Angina pectoris 11/02/2023   Angina, class III 03/01/2018   Arthralgia of right knee 08/07/2024   Atherosclerosis of native coronary artery of native heart with stable angina pectoris 08/12/2024   BPH (benign prostatic hyperplasia) 09/19/2009   Qualifier: Diagnosis of  By: Vincent Stevens    Chest pain of uncertain etiology 10/20/2023   Coronary artery disease involving native coronary artery of native heart with angina pectoris    Diabetes mellitus due to underlying condition with unspecified complications (HCC) 09/13/2022   Dyslipidemia 08/14/2010   Qualifier: Diagnosis of  By: Vincent Stevens  Dyslipidemia associated with type 2 diabetes mellitus (HCC) 08/14/2010   Lab Results      Component    Value    Date           CHOL    134    04/19/2018           HDL    43    04/19/2018           LDLCALC    70    04/19/2018           TRIG    105    04/19/2018           CHOLHDL    3.1    04/19/2018           Dysuria 08/14/2024   Essential hypertension 01/17/2009   Qualifier: Diagnosis of  By: Vincent Stevens    GERD (gastroesophageal reflux disease) 09/09/2022   Gout    History of colonic polyps 09/27/2008   Qualifier: Diagnosis of  By: Vincent Stevens  Initial colonoscopy 2003.  Hyperplastic  polyps only Followup colonoscopy 2008.  Normal  10 year interval recommended    Hx of CABG 03/01/2018   Left main coronary artery disease    Melanoma (HCC)    Near syncope 08/14/2024   Osteoarthritis of knee 08/07/2024   Pain in both lower extremities 08/15/2018   Prediabetes 09/13/2022   Sinus bradycardia 08/14/2024    Past Surgical History:  Procedure Laterality Date   CORONARY ARTERY BYPASS GRAFT N/A 03/01/2018   Procedure: CORONARY ARTERY BYPASS GRAFTING (CABG) times three using the right saphaneous vein. Harvested endoscopicly; and left internal mammary artery.;  Surgeon: Vincent Dorise POUR, MD;  Location: [facility] OR;  Service: Open Heart Surgery;  Laterality: N/A;   HERNIA REPAIR     LEFT HEART CATH AND CORONARY ANGIOGRAPHY N/A 03/01/2018   Procedure: LEFT HEART CATH AND CORONARY ANGIOGRAPHY;  Surgeon: Vincent Alm ORN, MD;  Location: [facility] INVASIVE CV LAB;  Service: Cardiovascular;  Laterality: N/A;   LEFT HEART CATH AND CORS/GRAFTS ANGIOGRAPHY N/A 11/04/2023   Procedure: LEFT HEART CATH AND CORS/GRAFTS ANGIOGRAPHY;  Surgeon: Vincent Heinz, MD;  Location: [facility] INVASIVE CV LAB;  Service: Cardiovascular;  Laterality: N/A;   melanoma removal     TEE WITHOUT CARDIOVERSION N/A 03/01/2018   Procedure: TRANSESOPHAGEAL ECHOCARDIOGRAM (TEE);  Surgeon: Vincent Dorise POUR, MD;  Location: [facility] OR;  Service: Open Heart Surgery;  Laterality: N/A;    Current Medications: Current Meds  Medication Sig   alfuzosin  (UROXATRAL ) 10 MG 24 hr tablet Take 1 tablet (10 mg total) by mouth at bedtime.   aspirin  EC 81 MG tablet Take 1 tablet (81 mg total) by mouth daily.   azelastine  (ASTELIN ) 0.1 % nasal spray Place 2 sprays into both nostrils 2 (two) times daily.   benzonatate  (TESSALON ) 200 MG capsule Take 1 capsule (200 mg total) by mouth 2 (two) times daily as needed for cough.   finasteride  (PROSCAR ) 5 MG tablet Take 1 tablet (5 mg total) by mouth daily.   ibuprofen  (ADVIL ,MOTRIN ) 200 MG tablet Take 200 mg by mouth 2 (two)  times daily as needed (arthritis apin).    isosorbide  mononitrate (IMDUR ) 30 MG 24 hr tablet Take 1 tablet (30 mg total) by mouth every morning.   metFORMIN  (GLUCOPHAGE ) 500 MG tablet Take 1 tablet (500 mg total) by mouth daily with breakfast.   metoprolol  succinate (TOPROL -XL) 25 MG 24 hr tablet Take 1 tablet (25 mg  total) by mouth at bedtime. TAKE WITH OR IMMEDIATELY FOLLOWING A MEAL.   Multiple Vitamin (MULTIVITAMIN WITH MINERALS) TABS tablet Take 1 tablet by mouth at bedtime.   nitroGLYCERIN  (NITROSTAT ) 0.4 MG SL tablet Place 0.4 mg under the tongue every 5 (five) minutes as needed for chest pain.   ranolazine (RANEXA) 1000 MG SR tablet Take 1 tablet (1,000 mg total) by mouth 2 (two) times daily.   ranolazine (RANEXA) 500 MG 12 hr tablet Take 1 tablet (500 mg total) by mouth 2 (two) times daily.   simvastatin  (ZOCOR ) 80 MG tablet TAKE 1 TABLET DAILY. PATIENT NEEDS AN APPOINTMENT FOR FURTHER REFILLS. 3 RD/FINAL ATTEMPT   triamcinolone  ointment (KENALOG ) 0.5 % APPLY 1 APPLICATION TOPICALLY 2 (TWO) TIMES DAILY.     Allergies:   Sulfamethoxazole   Social History   Socioeconomic History   Marital status: Married    Spouse name: Not on file   Number of children: 1   Years of education: Not on file   Highest education level: Not on file  Occupational History   Occupation: Courier     Comment: Part time   Tobacco Use   Smoking status: Former    Current packs/day: 0.00    Types: Cigarettes    Quit date: 11/29/1980    Years since quitting: 43.8   Smokeless tobacco: Never  Vaping Use   Vaping status: Never Used  Substance and Sexual Activity   Alcohol use: No   Drug use: No   Sexual activity: Not on file  Other Topics Concern   Not on file  Social History Narrative   Not on file   Social Drivers of Health   Financial Resource Strain: Low Risk  (04/10/2024)   Overall Financial Resource Strain (CARDIA)    Difficulty of Paying Living Expenses: Not hard at all  Food Insecurity: No  Food Insecurity (08/14/2024)   Hunger Vital Sign    Worried About Running Out of Food in the Last Year: Never true    Ran Out of Food in the Last Year: Never true  Transportation Needs: No Transportation Needs (08/14/2024)   PRAPARE - Administrator, Civil Service (Medical): No    Lack of Transportation (Non-Medical): No  Physical Activity: Inactive (04/10/2024)   Exercise Vital Sign    Days of Exercise per Week: 0 days    Minutes of Exercise per Session: 0 min  Stress: No Stress Concern Present (04/10/2024)   [employer] of Occupational Health - Occupational Stress Questionnaire    Feeling of Stress : Not at all  Social Connections: Moderately Isolated (08/14/2024)   Social Connection and Isolation Panel    Frequency of Communication with Friends and Family: More than three times a week    Frequency of Social Gatherings with Friends and Family: Three times a week    Attends Religious Services: Never    Active Member of Clubs or Organizations: No    Attends Banker Meetings: Never    Marital Status: Married     Family History: The patient's family history includes Cancer in his sister; Dementia in his mother; Diabetes in an other family member; Heart attack in his father; Hypertension in an other family member.  ROS:   Please see the history of present illness.    All other systems reviewed and are negative.  EKGs/Labs/Other Studies Reviewed:    The following studies were reviewed today:    R    LV perfusion is abnormal. Defect 1:  There is a medium defect with severe reduction in uptake present in the basal anterolateral location(s) that is partially reversible. There is abnormal wall motion in the defect area. Consistent with infarction and peri-infarct ischemia. The defect is consistent with abnormal perfusion in the LAD territory. Defect 2: There is a medium defect with moderate reduction in uptake present in the mid to basal anterior location(s)  that is reversible. There is normal wall motion in the defect area. Consistent with ischemia. The defect is consistent with abnormal perfusion in the LAD territory. Defect 3: There is a medium defect with moderate reduction in uptake present in the mid to basal inferolateral location(s) that is reversible. There is normal wall motion in the defect area. Consistent with ischemia. The defect is consistent with abnormal perfusion in the RCA territory.   Rest left ventricular function is abnormal. There was a single regional abnormality. Rest EF: 48%. Stress EF: 52%. End diastolic cavity size is normal. End systolic cavity size is mildly enlarged.   Myocardial blood flow was computed to be 0.65ml/g/min at rest and 0.97ml/g/min at stress. Global myocardial blood flow reserve was 1.41 and was abnormal.   Coronary calcium  assessment not performed due to prior revascularization. Prior CABG   Findings are consistent with infarction with peri-infarct ischemia. The study is intermediate risk.  ecent Labs: 08/13/2024: ALT 28; Hemoglobin 15.1; Platelets 208 08/15/2024: BUN 29; Creatinine, Ser 1.27; Potassium 4.6; Sodium 136  Recent Lipid Panel    Component Value Date/Time   CHOL 141 03/01/2024 0825   TRIG 68 03/01/2024 0825   HDL 53 03/01/2024 0825   CHOLHDL 2.7 03/01/2024 0825   CHOLHDL 3 09/12/2023 1021   VLDL 12.8 09/12/2023 1021   LDLCALC 74 03/01/2024 0825    Physical Exam:    VS:  BP 110/66   Pulse 66   Ht 5' 10 (1.778 m)   Wt 183 lb 12.8 oz (83.4 kg)   SpO2 99%   BMI 26.37 kg/m     Wt Readings from Last 3 Encounters:  09/21/24 183 lb 12.8 oz (83.4 kg)  09/13/24 185 lb 9.6 oz (84.2 kg)  08/23/24 185 lb (83.9 kg)     GEN: Patient is in no acute distress HEENT: Normal NECK: No JVD; No carotid bruits LYMPHATICS: No lymphadenopathy CARDIAC: Hear sounds regular, 2/6 systolic murmur at the apex. RESPIRATORY:  Clear to auscultation without rales, wheezing or rhonchi  ABDOMEN: Soft,  non-tender, non-distended MUSCULOSKELETAL:  No edema; No deformity  SKIN: Warm and dry NEUROLOGIC:  Alert and oriented x 3 PSYCHIATRIC:  Normal affect   Signed, Vincent JONELLE Crape, MD  09/21/2024 12:13 PM    [facility]

## 2024-09-21 NOTE — Patient Instructions (Signed)
 Medication Instructions:  Your physician has recommended you make the following change in your medication:   Start Ranexa 500 mg twice daily for 2 weeks then increase to 1000 mg twice daily.  *If you need a refill on your cardiac medications before your next appointment, please call your pharmacy*   Lab Work: None ordered If you have labs (blood work) drawn today and your tests are completely normal, you will receive your results only by: MyChart Message (if you have MyChart) OR A paper copy in the mail If you have any lab test that is abnormal or we need to change your treatment, we will call you to review the results.   Testing/Procedures: None ordered   Follow-Up: At [facility], you and your health needs are our priority.  As part of our continuing mission to provide you with exceptional heart care, we have created designated Provider Care Teams.  These Care Teams include your primary Cardiologist (physician) and Advanced Practice Providers (APPs -  Physician Assistants and Nurse Practitioners) who all work together to provide you with the care you need, when you need it.  We recommend signing up for the patient portal called MyChart.  Sign up information is provided on this After Visit Summary.  MyChart is used to connect with patients for Virtual Visits (Telemedicine).  Patients are able to view lab/test results, encounter notes, upcoming appointments, etc.  Non-urgent messages can be sent to your provider as well.   To learn more about what you can do with MyChart, go to [email or web address].    Your next appointment:   3 month(s)  The format for your next appointment:   In Person  Provider:   Jennifer Crape, MD    Other Instructions none  Important Information About Sugar

## 2024-09-25 ENCOUNTER — Telehealth (HOSPITAL_COMMUNITY): Payer: Self-pay

## 2024-09-25 ENCOUNTER — Telehealth (HOSPITAL_COMMUNITY): Payer: Self-pay | Admitting: *Deleted

## 2024-09-25 NOTE — Telephone Encounter (Signed)
 Chart review. Patient ready to be scheduled for cardiac rehab.Hadassah Elpidio Quan RN BSN

## 2024-09-25 NOTE — Telephone Encounter (Signed)
 Called patient to see if he was interested in participating in the Cardiac Rehab Program. Patient will come in for orientation on 11/18 and will attend the 8:15 exercise class.  Sent MyChart message.

## 2024-09-27 ENCOUNTER — Ambulatory Visit (INDEPENDENT_AMBULATORY_CARE_PROVIDER_SITE_OTHER): Admitting: Family Medicine

## 2024-09-27 ENCOUNTER — Encounter: Payer: Self-pay | Admitting: Family Medicine

## 2024-09-27 ENCOUNTER — Other Ambulatory Visit: Payer: Self-pay | Admitting: *Deleted

## 2024-09-27 ENCOUNTER — Telehealth: Payer: Self-pay | Admitting: Family Medicine

## 2024-09-27 VITALS — BP 106/64 | HR 60 | Temp 97.4°F | Resp 14 | Ht 70.0 in | Wt 187.8 lb

## 2024-09-27 DIAGNOSIS — Z0279 Encounter for issue of other medical certificate: Secondary | ICD-10-CM

## 2024-09-27 DIAGNOSIS — E1159 Type 2 diabetes mellitus with other circulatory complications: Secondary | ICD-10-CM

## 2024-09-27 DIAGNOSIS — I25119 Atherosclerotic heart disease of native coronary artery with unspecified angina pectoris: Secondary | ICD-10-CM

## 2024-09-27 DIAGNOSIS — I10 Essential (primary) hypertension: Secondary | ICD-10-CM | POA: Diagnosis not present

## 2024-09-27 MED ORDER — METFORMIN HCL 500 MG PO TABS: 500.0000 mg | ORAL_TABLET | Freq: Every day | ORAL | 1 refills | Status: AC

## 2024-09-27 NOTE — Assessment & Plan Note (Addendum)
 Blood pressure better controlled today.  Not having any more hypotensive episodes.  He is currently on metoprolol  succinate 50 mg daily and Imdur  30 mg daily per cardiology.

## 2024-09-27 NOTE — Telephone Encounter (Signed)
 Prescription Request  09/27/2024  LOV: 09/27/2024  What is the name of the medication or equipment? metFORMIN  (GLUCOPHAGE ) 500 MG tablet  metoprolol  succinate (TOPROL -XL) 25 MG 24 hr tablet   Have you contacted your pharmacy to request a refill? Yes   Which pharmacy would you like this sent to?     [id number] [place], [place] - [address] Phone: [phone]  Fax: [phone]      Patient notified that their request is being sent to the clinical staff for review and that they should receive a response within 2 business days.   Please advise at Mobile [phone] (mobile)

## 2024-09-27 NOTE — Telephone Encounter (Signed)
 Rx send to pharmacy

## 2024-09-27 NOTE — Patient Instructions (Addendum)
 It was very nice to see you today!  VISIT SUMMARY: Today, we discussed your recent hospitalization, therapy needs, and FMLA paperwork. Your blood pressure is stable with your current medication, and you will start therapy next week.  YOUR PLAN: HYPERTENSION: Your blood pressure is controlled with your current medication, ranolazine. -Continue taking ranolazine as prescribed. -Monitor your blood pressure regularly.  FAMILY MEDICAL LEAVE ACT (FMLA): You need FMLA paperwork due to your recent hospitalization and upcoming therapy sessions. -We are processing your FMLA paperwork for six months of intermittent leave, three days per week. -We will fax the paperwork to the appropriate department and mail a copy to you.  Return if symptoms worsen or fail to improve.   Take care, Dr Kennyth  PLEASE NOTE:  If you had any lab tests, please let us  know if you have not heard back within a few days. You may see your results on mychart before we have a chance to review them but we will give you a call once they are reviewed by us .   If we ordered any referrals today, please let us  know if you have not heard from their office within the next week.   If you had any urgent prescriptions sent in today, please check with the pharmacy within an hour of our visit to make sure the prescription was transmitted appropriately.   Please try these tips to maintain a healthy lifestyle:  Eat at least 3 REAL meals and 1-2 snacks per day.  Aim for no more than 5 hours between eating.  If you eat breakfast, please do so within one hour of getting up.   Each meal should contain half fruits/vegetables, one quarter protein, and one quarter carbs (no bigger than a computer mouse)  Cut down on sweet beverages. This includes juice, soda, and sweet tea.   Drink at least 1 glass of water with each meal and aim for at least 8 glasses per day  Exercise at least 150 minutes every week.

## 2024-09-27 NOTE — Assessment & Plan Note (Signed)
 Along with neurology.  On aspirin  and statin though just recently started on Ranexa.  Symptoms seem to be improving.  Will be starting cardiopulmonary rehab soon as well.  Will complete FMLA paperwork today.  This will be scanned into the chart.

## 2024-09-27 NOTE — Progress Notes (Signed)
   Vincent Stevens is a 78 y.o. male who presents today for an office visit.  Assessment/Plan:  Chronic Problems Addressed Today: Hypertension associated with diabetes (HCC) Blood pressure better controlled today.  Not having any more hypotensive episodes.  He is currently on metoprolol  succinate 50 mg daily and Imdur  30 mg daily per cardiology.    Coronary artery disease involving native coronary artery of native heart with angina pectoris Along with neurology.  On aspirin  and statin though just recently started on Ranexa.  Symptoms seem to be improving.  Will be starting cardiopulmonary rehab soon as well.  Will complete FMLA paperwork today.  This will be scanned into the chart.     Subjective:  HPI:  See assessment / plan for status of chronic conditions.   Discussed the use of AI scribe software for clinical note transcription with the patient, who gave verbal consent to proceed.  History of Present Illness Vincent Stevens is a 78 year old male who presents for follow-up and FMLA paperwork related to recent hospitalization and therapy needs. His wife cannot take him to therapy sessions three times a week due to her age and he is relying on his daughter to help him with transportation needs.  He was hospitalized last month due to confusion due to what is likely a hypotensive episode.  I did see him a couple weeks ago and he was doing well at that time.  Since our most recent visit he has followed with cardiology for coronary artery disease with angina.  Started on Ranexa which does seem to be helping his symptoms though will also be starting cardiopulmonary rehab soon times weekly for the next few months.         Objective:  Physical Exam: BP 106/64   Pulse 60   Temp (!) 97.4 F (36.3 C) (Temporal)   Resp 14   Ht 5' 10 (1.778 m)   Wt 187 lb 12.8 oz (85.2 kg)   SpO2 97%   BMI 26.95 kg/m   Gen: No acute distress, resting comfortably CV: Regular rate and rhythm with no  murmurs appreciated Pulm: Normal work of breathing, clear to auscultation bilaterally with no crackles, wheezes, or rhonchi Neuro: Grossly normal, moves all extremities Psych: Normal affect and thought content      Vincent Stevens M. Kennyth, MD 09/27/2024 1:30 PM

## 2024-09-28 ENCOUNTER — Ambulatory Visit: Attending: Nurse Practitioner

## 2024-09-28 DIAGNOSIS — E785 Hyperlipidemia, unspecified: Secondary | ICD-10-CM

## 2024-09-28 DIAGNOSIS — I10 Essential (primary) hypertension: Secondary | ICD-10-CM

## 2024-09-28 DIAGNOSIS — E118 Type 2 diabetes mellitus with unspecified complications: Secondary | ICD-10-CM

## 2024-09-28 DIAGNOSIS — I25119 Atherosclerotic heart disease of native coronary artery with unspecified angina pectoris: Secondary | ICD-10-CM

## 2024-09-28 DIAGNOSIS — E088 Diabetes mellitus due to underlying condition with unspecified complications: Secondary | ICD-10-CM

## 2024-10-02 DIAGNOSIS — R55 Syncope and collapse: Secondary | ICD-10-CM | POA: Diagnosis not present

## 2024-10-02 DIAGNOSIS — I10 Essential (primary) hypertension: Secondary | ICD-10-CM | POA: Diagnosis not present

## 2024-10-02 DIAGNOSIS — I25119 Atherosclerotic heart disease of native coronary artery with unspecified angina pectoris: Secondary | ICD-10-CM

## 2024-10-04 ENCOUNTER — Telehealth: Payer: Self-pay | Admitting: Cardiology

## 2024-10-04 NOTE — Telephone Encounter (Signed)
 Results reviewed with pt as per Jackee Alberts PA's note.  Pt verbalized understanding and had no additional questions. Routed to PCP.   Please let patient know that his event monitor showed no episodes of heart pauses or sustained abnormal heart rhythm.  There were 2 fast heartbeats that occurred in the top portion of the heart with the longest episode lasting 20 seconds.  Please follow-up with your primary cardiologist for further recommendations.     Jackee Alberts, NP

## 2024-10-04 NOTE — Telephone Encounter (Signed)
Patient called to follow-up on heart monitor results.

## 2024-10-05 NOTE — Progress Notes (Signed)
 Patient viewed in mychart     Last read by Elsie FORBES Collier at 3:22PM on 10/04/2024.

## 2024-10-10 ENCOUNTER — Telehealth: Payer: Self-pay | Admitting: Family Medicine

## 2024-10-10 ENCOUNTER — Ambulatory Visit: Admitting: Cardiology

## 2024-10-10 NOTE — Telephone Encounter (Signed)
 Last refill by Raenelle Donalda HERO, MD  Please advise

## 2024-10-10 NOTE — Telephone Encounter (Signed)
  Encourage patient to contact the pharmacy for refills or they can request refills through [facility]  LAST APPOINTMENT DATE:  09/27/24  NEXT APPOINTMENT DATE:  MEDICATION: alfuzosin  (UROXATRAL ) 10 MG 24 hr tablet   Is the patient out of medication? Only has 3 pills left  PHARMACY:  [id number] [place], [place] - [address] Phone: [phone]  Fax: [phone]      Let patient know to contact pharmacy at the end of the day to make sure medication is ready.  Please notify patient to allow 48-72 hours to process

## 2024-10-11 ENCOUNTER — Other Ambulatory Visit: Payer: Self-pay | Admitting: *Deleted

## 2024-10-11 MED ORDER — ALFUZOSIN HCL ER 10 MG PO TB24: 10.0000 mg | ORAL_TABLET | Freq: Every day | ORAL | 1 refills | Status: DC

## 2024-10-11 NOTE — Telephone Encounter (Signed)
 Copied from CRM [id number]. Topic: Clinical - Prescription Issue >> Oct 11, 2024  7:47 AM Pinkey ORN wrote: Niels wife states patient was supposed to be called in a prescription after yesterday's office visit, but when going to the pharmacy they state nothing was ever received. Niels also states patient is running out of alfuzosin  (UROXATRAL ) 10 MG 24 hr tablet    [facility]  Patient aware  [facility]

## 2024-10-11 NOTE — Telephone Encounter (Signed)
 It is ok to refill this but please make sure this was not supposed to go to his urologist.   Worth HERO. Kennyth, MD 10/11/2024 11:54 AM

## 2024-10-12 ENCOUNTER — Other Ambulatory Visit: Payer: Self-pay | Admitting: *Deleted

## 2024-10-12 MED ORDER — ALFUZOSIN HCL ER 10 MG PO TB24: 10.0000 mg | ORAL_TABLET | Freq: Every day | ORAL | 1 refills | Status: DC

## 2024-10-15 ENCOUNTER — Telehealth (HOSPITAL_COMMUNITY): Payer: Self-pay

## 2024-10-15 DIAGNOSIS — M25561 Pain in right knee: Secondary | ICD-10-CM | POA: Diagnosis not present

## 2024-10-15 NOTE — Telephone Encounter (Signed)
 Confirmed cardiac orientation appointment time of 10/16/24 at 0800.

## 2024-10-16 ENCOUNTER — Encounter (HOSPITAL_COMMUNITY)
Admission: RE | Admit: 2024-10-16 | Discharge: 2024-10-16 | Disposition: A | Discharge status: Home / Self Care | Source: Ambulatory Visit | Attending: Cardiology | Admitting: Cardiology

## 2024-10-16 ENCOUNTER — Other Ambulatory Visit: Payer: Self-pay | Admitting: *Deleted

## 2024-10-16 ENCOUNTER — Telehealth: Payer: Self-pay | Admitting: *Deleted

## 2024-10-16 VITALS — BP 93/51 | HR 56 | Ht 67.0 in | Wt 190.7 lb

## 2024-10-16 DIAGNOSIS — I25119 Atherosclerotic heart disease of native coronary artery with unspecified angina pectoris: Secondary | ICD-10-CM | POA: Insufficient documentation

## 2024-10-16 DIAGNOSIS — Z951 Presence of aortocoronary bypass graft: Secondary | ICD-10-CM | POA: Diagnosis not present

## 2024-10-16 DIAGNOSIS — Z48812 Encounter for surgical aftercare following surgery on the circulatory system: Secondary | ICD-10-CM | POA: Insufficient documentation

## 2024-10-16 DIAGNOSIS — I2089 Other forms of angina pectoris: Secondary | ICD-10-CM

## 2024-10-16 LAB — GLUCOSE, CAPILLARY: Glucose-Capillary: 96 mg/dL (ref 70–99)

## 2024-10-16 MED ORDER — LANCET DEVICE MISC: 1.0000 | 0 refills | Status: AC

## 2024-10-16 MED ORDER — BLOOD GLUCOSE TEST VI STRP: 1.0000 | ORAL_STRIP | 0 refills | Status: AC

## 2024-10-16 MED ORDER — BLOOD GLUCOSE MONITORING SUPPL DEVI: 1.0000 | 0 refills | Status: AC

## 2024-10-16 MED ORDER — LANCETS MISC: 1.0000 | 0 refills | Status: AC

## 2024-10-16 NOTE — Telephone Encounter (Signed)
 Copied from CRM #[id number]. Topic: Clinical - Order For Equipment >> Oct 16, 2024  9:35 AM Jasmin G wrote: Reason for CRM: Staff from Cardiac Rehab clinic called to request  a meter for blood sugar supplied to [id number] [place], [place] - [address]   Left message to return call to our office at their convenience.  Rusell Meneely,RMA

## 2024-10-16 NOTE — Progress Notes (Signed)
 Patient here for cardiac rehab orientation. Manual BP hard to auscultate. Auto BP 88/49. Patient was given water. Repeat BP 98/61. Asymptomatic. Heart rate 53. Telemetry rhythm Sinus. CBG 96.  Recheck sitting BP 93/51 sitting. Standing BP 103/55. Medication's reviewed taking as prescribed. Patient reported that he drank about 8 ounces of water and ate a cream horn for breakfast. Will discuss with onsite provider Josefa Beauvais NP. Reviewed today's vital signs to onsite provider Josefa Beauvais NP. Instructed the patient to make sure that he hydrates and eats a protein and a carbohydrate before coming to exercise at cardiac rehab. Patient states understanding. Bill does not have a CBG meter at home. Dr Shaune office called. Asked that that a rx for a CBG meter be called in to the patient's pharmacy so that the patient can  check his CBG's at home.Hadassah Elpidio Quan RN BSN

## 2024-10-16 NOTE — Progress Notes (Signed)
 Cardiac Rehab Medication Review   Does the patient  feel that his/her medications are working for him/her?  yes  Has the patient been experiencing any side effects to the medications prescribed?  yes  Does the patient measure his/her own blood pressure or blood glucose at home?  yes   Does the patient have any problems obtaining medications due to transportation or finances?   no  Understanding of regimen: good Understanding of indications: good Potential of compliance: good    Comments: Pt voices muscle pain in the legs.Pt feels like this started when his dose of Renaxa was increased to 1000 mg Bid.    Vincent Stevens 10/16/2024 8:48 AM

## 2024-10-16 NOTE — Progress Notes (Signed)
 Cardiac Individual Treatment Plan  Patient Details  Name: Vincent Stevens MRN: [id number] Date of Birth: 26-Feb-1946 Referring Provider:   Flowsheet Row INTENSIVE CARDIAC REHAB ORIENT from 10/16/2024 in [facility]  Referring Provider Jennifer Crape, MD    Initial Encounter Date:  Flowsheet Row INTENSIVE CARDIAC REHAB ORIENT from 10/16/2024 in [facility]  Date 10/16/24    Visit Diagnosis: Chronic stable angina  Patient's Home Medications on Admission:  Current Outpatient Medications:    alfuzosin  (UROXATRAL ) 10 MG 24 hr tablet, Take 1 tablet (10 mg total) by mouth at bedtime., Disp: 30 tablet, Rfl: 1   aspirin  EC 81 MG tablet, Take 1 tablet (81 mg total) by mouth daily., Disp: , Rfl:    azelastine  (ASTELIN ) 0.1 % nasal spray, Place 2 sprays into both nostrils 2 (two) times daily., Disp: 30 mL, Rfl: 12   benzonatate  (TESSALON ) 200 MG capsule, Take 1 capsule (200 mg total) by mouth 2 (two) times daily as needed for cough., Disp: 20 capsule, Rfl: 0   finasteride  (PROSCAR ) 5 MG tablet, Take 1 tablet (5 mg total) by mouth daily., Disp: 30 tablet, Rfl: 2   ibuprofen  (ADVIL ,MOTRIN ) 200 MG tablet, Take 200 mg by mouth 2 (two) times daily as needed (arthritis apin). , Disp: , Rfl:    isosorbide  mononitrate (IMDUR ) 30 MG 24 hr tablet, Take 1 tablet (30 mg total) by mouth every morning., Disp: 30 tablet, Rfl: 2   metFORMIN  (GLUCOPHAGE ) 500 MG tablet, Take 1 tablet (500 mg total) by mouth daily with breakfast., Disp: 90 tablet, Rfl: 1   metoprolol  succinate (TOPROL -XL) 25 MG 24 hr tablet, Take 1 tablet (25 mg total) by mouth at bedtime. TAKE WITH OR IMMEDIATELY FOLLOWING A MEAL., Disp: 90 tablet, Rfl: 3   Multiple Vitamin (MULTIVITAMIN WITH MINERALS) TABS tablet, Take 1 tablet by mouth at bedtime., Disp: , Rfl:    nitroGLYCERIN  (NITROSTAT ) 0.4 MG SL tablet, Place 0.4 mg under the  tongue every 5 (five) minutes as needed for chest pain., Disp: , Rfl:    ranolazine (RANEXA) 1000 MG SR tablet, Take 1 tablet (1,000 mg total) by mouth 2 (two) times daily., Disp: 180 tablet, Rfl: 3   simvastatin  (ZOCOR ) 80 MG tablet, TAKE 1 TABLET DAILY. PATIENT NEEDS AN APPOINTMENT FOR FURTHER REFILLS. 3 RD/FINAL ATTEMPT, Disp: 90 tablet, Rfl: 1   triamcinolone  ointment (KENALOG ) 0.5 %, APPLY 1 APPLICATION TOPICALLY 2 (TWO) TIMES DAILY., Disp: 30 g, Rfl: 5   ranolazine (RANEXA) 500 MG 12 hr tablet, Take 1 tablet (500 mg total) by mouth 2 (two) times daily. (Patient not taking: Reported on 10/16/2024), Disp: 28 tablet, Rfl: 0  Past Medical History: Past Medical History:  Diagnosis Date   Allergic rhinitis 06/17/2021   Allergy    Angina pectoris 11/02/2023   Angina, class III 03/01/2018   Arthralgia of right knee 08/07/2024   Atherosclerosis of native coronary artery of native heart with stable angina pectoris 08/12/2024   BPH (benign prostatic hyperplasia) 09/19/2009   Qualifier: Diagnosis of  By: Jame  MD, Maude FALCON    Chest pain of uncertain etiology 10/20/2023   Coronary artery disease involving native coronary artery of native heart with angina pectoris    Diabetes mellitus due to underlying condition with unspecified complications (HCC) 09/13/2022   Dyslipidemia 08/14/2010   Qualifier: Diagnosis of  By: Jame  MD, Maude FALCON    Dyslipidemia associated with  type 2 diabetes mellitus (HCC) 08/14/2010   Lab Results      Component    Value    Date           CHOL    134    04/19/2018           HDL    43    04/19/2018           LDLCALC    70    04/19/2018           TRIG    105    04/19/2018           CHOLHDL    3.1    04/19/2018           Dysuria 08/14/2024   Essential hypertension 01/17/2009   Qualifier: Diagnosis of  By: Jame  MD, Maude FALCON    GERD (gastroesophageal reflux disease) 09/09/2022   Gout    History of colonic polyps 09/27/2008   Qualifier: Diagnosis of  By:  Jame  MD, Maude FALCON  Initial colonoscopy 2003.  Hyperplastic polyps only Followup colonoscopy 2008.  Normal  10 year interval recommended    Hx of CABG 03/01/2018   Left main coronary artery disease    Melanoma (HCC)    Near syncope 08/14/2024   Osteoarthritis of knee 08/07/2024   Pain in both lower extremities 08/15/2018   Prediabetes 09/13/2022   Sinus bradycardia 08/14/2024    Tobacco Use: Social History   Tobacco Use  Smoking Status Former   Current packs/day: 0.00   Types: Cigarettes   Quit date: 11/29/1980   Years since quitting: 43.9  Smokeless Tobacco Never    Labs: Review Flowsheet  More data exists      Latest Ref Rng & Units 09/09/2022 03/14/2023 09/12/2023 03/01/2024 08/15/2024  Labs for ITP Cardiac and Pulmonary Rehab  Cholestrol 100 - 199 mg/dL 864  - 862  858  -  LDL (calc) 0 - 99 mg/dL 70  - 71  74  -  HDL-C >39 mg/dL 53.19  - 46.29  53  -  Trlycerides 0 - 149 mg/dL 07.9  - 35.9  68  -  Hemoglobin A1c 4.8 - 5.6 % 6.3  5.6  6.1  5.9  5.9     Capillary Blood Glucose: Lab Results  Component Value Date   GLUCAP 96 10/16/2024   GLUCAP 112 (H) 08/15/2024   GLUCAP 132 (H) 08/14/2024   GLUCAP 111 (H) 08/14/2024   GLUCAP 89 08/14/2024     Exercise Target Goals: Exercise Program Goal: Individual exercise prescription set using results from initial 6 min walk test and THRR while considering  patient's activity barriers and safety.   Exercise Prescription Goal: Initial exercise prescription builds to 30-45 minutes a day of aerobic activity, 2-3 days per week.  Home exercise guidelines will be given to patient during program as part of exercise prescription that the participant will acknowledge.  Activity Barriers & Risk Stratification:  Activity Barriers & Cardiac Risk Stratification - 10/16/24 0901       Activity Barriers & Cardiac Risk Stratification   Activity Barriers Balance Concerns;Arthritis;Joint Problems;Back Problems;Deconditioning;Muscular  Weakness;Chest Pain/Angina    Cardiac Risk Stratification High          6 Minute Walk:  6 Minute Walk     Row Name 10/16/24 1353         6 Minute Walk   Phase Initial  Used Go-cart     Distance 767 feet  Walk Time 6 minutes     # of Rest Breaks 1  3:58-5:40     MPH 1.5     METS 1.52     RPE 11     Perceived Dyspnea  1     VO2 Peak 5.33     Symptoms Yes (comment)     Comments chronic pain posterior knee 5/10; chronic shoulders 5/10     Resting HR 56 bpm     Resting BP 93/51     Resting Oxygen Saturation  97 %     Exercise Oxygen Saturation  during 6 min walk 99 %     Max Ex. HR 102 bpm     Max Ex. BP 152/67     2 Minute Post BP 141/67        Oxygen Initial Assessment:   Oxygen Re-Evaluation:   Oxygen Discharge (Final Oxygen Re-Evaluation):   Initial Exercise Prescription:  Initial Exercise Prescription - 10/16/24 0800       Date of Initial Exercise RX and Referring Provider   Date 10/16/24    Referring Provider Jennifer Crape, MD    Expected Discharge Date 01/10/24      Recumbant Bike   Level 1    RPM 50    Watts 10    Minutes 15    METs 1.5      NuStep   Level 1    SPM 70    Minutes 15    METs 1.5      Prescription Details   Frequency (times per week) 3    Duration Progress to 30 minutes of continuous aerobic without signs/symptoms of physical distress      Intensity   THRR 40-80% of Max Heartrate 56-113    Ratings of Perceived Exertion 11-13    Perceived Dyspnea 0-4      Progression   Progression Continue progressive overload as per policy without signs/symptoms or physical distress.      Resistance Training   Training Prescription Yes    Weight 2 lbs    Reps 10-15          Perform Capillary Blood Glucose checks as needed.  Exercise Prescription Changes:   Exercise Comments:   Exercise Goals and Review:   Exercise Goals     Row Name 10/16/24 0902             Exercise Goals   Increase Physical Activity Yes        Intervention Provide advice, education, support and counseling about physical activity/exercise needs.;Develop an individualized exercise prescription for aerobic and resistive training based on initial evaluation findings, risk stratification, comorbidities and participant's personal goals.       Expected Outcomes Short Term: Attend rehab on a regular basis to increase amount of physical activity.;Long Term: Add in home exercise to make exercise part of routine and to increase amount of physical activity.;Long Term: Exercising regularly at least 3-5 days a week.       Increase Strength and Stamina Yes       Intervention Provide advice, education, support and counseling about physical activity/exercise needs.;Develop an individualized exercise prescription for aerobic and resistive training based on initial evaluation findings, risk stratification, comorbidities and participant's personal goals.       Expected Outcomes Short Term: Increase workloads from initial exercise prescription for resistance, speed, and METs.;Short Term: Perform resistance training exercises routinely during rehab and add in resistance training at home;Long Term: Improve cardiorespiratory fitness, muscular endurance and strength as  measured by increased METs and functional capacity ( )       Able to understand and use rate of perceived exertion (RPE) scale Yes       Intervention Provide education and explanation on how to use RPE scale       Expected Outcomes Short Term: Able to use RPE daily in rehab to express subjective intensity level;Long Term:  Able to use RPE to guide intensity level when exercising independently       Knowledge and understanding of Target Heart Rate Range (THRR) Yes       Intervention Provide education and explanation of THRR including how the numbers were predicted and where they are located for reference       Expected Outcomes Short Term: Able to state/look up THRR;Long Term: Able to use THRR to  govern intensity when exercising independently;Short Term: Able to use daily as guideline for intensity in rehab       Understanding of Exercise Prescription Yes       Intervention Provide education, explanation, and written materials on patient's individual exercise prescription       Expected Outcomes Short Term: Able to explain program exercise prescription;Long Term: Able to explain home exercise prescription to exercise independently          Exercise Goals Re-Evaluation :   Discharge Exercise Prescription (Final Exercise Prescription Changes):   Nutrition:  Target Goals: Understanding of nutrition guidelines, daily intake of sodium 1500mg , cholesterol 200mg , calories 30% from fat and 7% or less from saturated fats, daily to have 5 or more servings of fruits and vegetables.  Biometrics:  Pre Biometrics - 10/16/24 0834       Pre Biometrics   Waist Circumference 45 inches    Hip Circumference 43 inches    Waist to Hip Ratio 1.05 %    Triceps Skinfold 15 mm    % Body Fat 31.2 %    Grip Strength 15 kg    Flexibility --   pt c/o back issues not performed   Single Leg Stand 1.2 seconds           Nutrition Therapy Plan and Nutrition Goals:   Nutrition Assessments:  MEDIFICTS Score Key: >=70 Need to make dietary changes  40-70 Heart Healthy Diet <= 40 Therapeutic Level Cholesterol Diet    Picture Your Plate Scores: <59 Unhealthy dietary pattern with much room for improvement. 41-50 Dietary pattern unlikely to meet recommendations for good health and room for improvement. 51-60 More healthful dietary pattern, with some room for improvement.  >60 Healthy dietary pattern, although there may be some specific behaviors that could be improved.    Nutrition Goals Re-Evaluation:   Nutrition Goals Re-Evaluation:   Nutrition Goals Discharge (Final Nutrition Goals Re-Evaluation):   Psychosocial: Target Goals: Acknowledge presence or absence of significant depression  and/or stress, maximize coping skills, provide positive support system. Participant is able to verbalize types and ability to use techniques and skills needed for reducing stress and depression.  Initial Review & Psychosocial Screening:  Initial Psych Review & Screening - 10/16/24 0857       Initial Review   Current issues with Current Sleep Concerns;Current Anxiety/Panic      Family Dynamics   Good Support System? Yes   Pt has sposue and daughter for support   Comments Pt does voice low level anxiety in regards to his health issues.      Barriers   Psychosocial barriers to participate in program The patient should benefit from  training in stress management and relaxation.      Screening Interventions   Interventions Encouraged to exercise    Expected Outcomes Short Term goal: Utilizing psychosocial counselor, staff and physician to assist with identification of specific Stressors or current issues interfering with healing process. Setting desired goal for each stressor or current issue identified.;Long Term Goal: Stressors or current issues are controlled or eliminated.;Short Term goal: Identification and review with participant of any Quality of Life or Depression concerns found by scoring the questionnaire.;Long Term goal: The participant improves quality of Life and PHQ9 Scores as seen by post scores and/or verbalization of changes          Quality of Life Scores:  Quality of Life - 10/16/24 1349       Quality of Life   Select Quality of Life      Quality of Life Scores   Health/Function Pre 26.2 %    Socioeconomic Pre 26.5 %    Psych/Spiritual Pre 28.07 %    Family Pre 30 %    GLOBAL Pre 27.19 %         Scores of 19 and below usually indicate a poorer quality of life in these areas.  A difference of  2-3 points is a clinically meaningful difference.  A difference of 2-3 points in the total score of the Quality of Life Index has been associated with significant  improvement in overall quality of life, self-image, physical symptoms, and general health in studies assessing change in quality of life.  PHQ-9: Review Flowsheet  More data exists      10/16/2024 09/27/2024 09/13/2024 08/23/2024 04/10/2024  Depression screen PHQ 2/9  Decreased Interest 1 0 0 0 0  Down, Depressed, Hopeless 0 0 0 0 0  PHQ - 2 Score 1 0 0 0 0  Altered sleeping 1 - - - -  Tired, decreased energy 1 - - - -  Change in appetite 1 - - - -  Feeling bad or failure about yourself  0 - - - -  Trouble concentrating 1 - - - -  Moving slowly or fidgety/restless 1 - - - -  Suicidal thoughts 0 - - - -  PHQ-9 Score 6 - - - -   Interpretation of Total Score  Total Score Depression Severity:  1-4 = Minimal depression, 5-9 = Mild depression, 10-14 = Moderate depression, 15-19 = Moderately severe depression, 20-27 = Severe depression   Psychosocial Evaluation and Intervention:   Psychosocial Re-Evaluation:   Psychosocial Discharge (Final Psychosocial Re-Evaluation):   Vocational Rehabilitation: Provide vocational rehab assistance to qualifying candidates.   Vocational Rehab Evaluation & Intervention:  Vocational Rehab - 10/16/24 0856       Initial Vocational Rehab Evaluation & Intervention   Assessment shows need for Vocational Rehabilitation No          Education: Education Goals: Education classes will be provided on a weekly basis, covering required topics. Participant will state understanding/return demonstration of topics presented.     Core Videos: Exercise    Move It!  Clinical staff conducted group or individual video education with verbal and written material and guidebook.  Patient learns the recommended Pritikin exercise program. Exercise with the goal of living a long, healthy life. Some of the health benefits of exercise include controlled diabetes, healthier blood pressure levels, improved cholesterol levels, improved heart and lung capacity, improved  sleep, and better body composition. Everyone should speak with their doctor before starting or changing an exercise routine.  Biomechanical Limitations Clinical staff conducted group or individual video education with verbal and written material and guidebook.  Patient learns how biomechanical limitations can impact exercise and how we can mitigate and possibly overcome limitations to have an impactful and balanced exercise routine.  Body Composition Clinical staff conducted group or individual video education with verbal and written material and guidebook.  Patient learns that body composition (ratio of muscle mass to fat mass) is a key component to assessing overall fitness, rather than body weight alone. Increased fat mass, especially visceral belly fat, can put us  at increased risk for metabolic syndrome, type 2 diabetes, heart disease, and even death. It is recommended to combine diet and exercise (cardiovascular and resistance training) to improve your body composition. Seek guidance from your physician and exercise physiologist before implementing an exercise routine.  Exercise Action Plan Clinical staff conducted group or individual video education with verbal and written material and guidebook.  Patient learns the recommended strategies to achieve and enjoy long-term exercise adherence, including variety, self-motivation, self-efficacy, and positive decision making. Benefits of exercise include fitness, good health, weight management, more energy, better sleep, less stress, and overall well-being.  Medical   Heart Disease Risk Reduction Clinical staff conducted group or individual video education with verbal and written material and guidebook.  Patient learns our heart is our most vital organ as it circulates oxygen, nutrients, white blood cells, and hormones throughout the entire body, and carries waste away. Data supports a plant-based eating plan like the Pritikin Program for its  effectiveness in slowing progression of and reversing heart disease. The video provides a number of recommendations to address heart disease.   Metabolic Syndrome and Belly Fat  Clinical staff conducted group or individual video education with verbal and written material and guidebook.  Patient learns what metabolic syndrome is, how it leads to heart disease, and how one can reverse it and keep it from coming back. You have metabolic syndrome if you have 3 of the following 5 criteria: abdominal obesity, high blood pressure, high triglycerides, low HDL cholesterol, and high blood sugar.  Hypertension and Heart Disease Clinical staff conducted group or individual video education with verbal and written material and guidebook.  Patient learns that high blood pressure, or hypertension, is very common in the [place] . Hypertension is largely due to excessive salt intake, but other important risk factors include being overweight, physical inactivity, drinking too much alcohol, smoking, and not eating enough potassium from fruits and vegetables. High blood pressure is a leading risk factor for heart attack, stroke, congestive heart failure, dementia, kidney failure, and premature death. Long-term effects of excessive salt intake include stiffening of the arteries and thickening of heart muscle and organ damage. Recommendations include ways to reduce hypertension and the risk of heart disease.  Diseases of Our Time - Focusing on Diabetes Clinical staff conducted group or individual video education with verbal and written material and guidebook.  Patient learns why the best way to stop diseases of our time is prevention, through food and other lifestyle changes. Medicine (such as prescription pills and surgeries) is often only a Band-Aid on the problem, not a long-term solution. Most common diseases of our time include obesity, type 2 diabetes, hypertension, heart disease, and cancer. The Pritikin Program is  recommended and has been proven to help reduce, reverse, and/or prevent the damaging effects of metabolic syndrome.  Nutrition   Overview of the Pritikin Eating Plan  Clinical staff conducted group or individual video education with  verbal and written material and guidebook.  Patient learns about the Pritikin Eating Plan for disease risk reduction. The Pritikin Eating Plan emphasizes a wide variety of unrefined, minimally-processed carbohydrates, like fruits, vegetables, whole grains, and legumes. Go, Caution, and Stop food choices are explained. Plant-based and lean animal proteins are emphasized. Rationale provided for low sodium intake for blood pressure control, low added sugars for blood sugar stabilization, and low added fats and oils for coronary artery disease risk reduction and weight management.  Calorie Density  Clinical staff conducted group or individual video education with verbal and written material and guidebook.  Patient learns about calorie density and how it impacts the Pritikin Eating Plan. Knowing the characteristics of the food you choose will help you decide whether those foods will lead to weight gain or weight loss, and whether you want to consume more or less of them. Weight loss is usually a side effect of the Pritikin Eating Plan because of its focus on low calorie-dense foods.  Label Reading  Clinical staff conducted group or individual video education with verbal and written material and guidebook.  Patient learns about the Pritikin recommended label reading guidelines and corresponding recommendations regarding calorie density, added sugars, sodium content, and whole grains.  Dining Out - Part 1  Clinical staff conducted group or individual video education with verbal and written material and guidebook.  Patient learns that restaurant meals can be sabotaging because they can be so high in calories, fat, sodium, and/or sugar. Patient learns recommended strategies on  how to positively address this and avoid unhealthy pitfalls.  Facts on Fats  Clinical staff conducted group or individual video education with verbal and written material and guidebook.  Patient learns that lifestyle modifications can be just as effective, if not more so, as many medications for lowering your risk of heart disease. A Pritikin lifestyle can help to reduce your risk of inflammation and atherosclerosis (cholesterol build-up, or plaque, in the artery walls). Lifestyle interventions such as dietary choices and physical activity address the cause of atherosclerosis. A review of the types of fats and their impact on blood cholesterol levels, along with dietary recommendations to reduce fat intake is also included.  Nutrition Action Plan  Clinical staff conducted group or individual video education with verbal and written material and guidebook.  Patient learns how to incorporate Pritikin recommendations into their lifestyle. Recommendations include planning and keeping personal health goals in mind as an important part of their success.  Healthy Mind-Set    Healthy Minds, Bodies, Hearts  Clinical staff conducted group or individual video education with verbal and written material and guidebook.  Patient learns how to identify when they are stressed. Video will discuss the impact of that stress, as well as the many benefits of stress management. Patient will also be introduced to stress management techniques. The way we think, act, and feel has an impact on our hearts.  How Our Thoughts Can Heal Our Hearts  Clinical staff conducted group or individual video education with verbal and written material and guidebook.  Patient learns that negative thoughts can cause depression and anxiety. This can result in negative lifestyle behavior and serious health problems. Cognitive behavioral therapy is an effective method to help control our thoughts in order to change and improve our emotional  outlook.  Additional Videos:  Exercise    Improving Performance  Clinical staff conducted group or individual video education with verbal and written material and guidebook.  Patient learns to use a non-linear  approach by alternating intensity levels and lengths of time spent exercising to help burn more calories and lose more body fat. Cardiovascular exercise helps improve heart health, metabolism, hormonal balance, blood sugar control, and recovery from fatigue. Resistance training improves strength, endurance, balance, coordination, reaction time, metabolism, and muscle mass. Flexibility exercise improves circulation, posture, and balance. Seek guidance from your physician and exercise physiologist before implementing an exercise routine and learn your capabilities and proper form for all exercise.  Introduction to Yoga  Clinical staff conducted group or individual video education with verbal and written material and guidebook.  Patient learns about yoga, a discipline of the coming together of mind, breath, and body. The benefits of yoga include improved flexibility, improved range of motion, better posture and core strength, increased lung function, weight loss, and positive self-image. Yoga's heart health benefits include lowered blood pressure, healthier heart rate, decreased cholesterol and triglyceride levels, improved immune function, and reduced stress. Seek guidance from your physician and exercise physiologist before implementing an exercise routine and learn your capabilities and proper form for all exercise.  Medical   Aging: Enhancing Your Quality of Life  Clinical staff conducted group or individual video education with verbal and written material and guidebook.  Patient learns key strategies and recommendations to stay in good physical health and enhance quality of life, such as prevention strategies, having an advocate, securing a Health Care Proxy and Power of Attorney, and keeping  a list of medications and system for tracking them. It also discusses how to avoid risk for bone loss.  Biology of Weight Control  Clinical staff conducted group or individual video education with verbal and written material and guidebook.  Patient learns that weight gain occurs because we consume more calories than we burn (eating more, moving less). Even if your body weight is normal, you may have higher ratios of fat compared to muscle mass. Too much body fat puts you at increased risk for cardiovascular disease, heart attack, stroke, type 2 diabetes, and obesity-related cancers. In addition to exercise, following the Pritikin Eating Plan can help reduce your risk.  Decoding Lab Results  Clinical staff conducted group or individual video education with verbal and written material and guidebook.  Patient learns that lab test reflects one measurement whose values change over time and are influenced by many factors, including medication, stress, sleep, exercise, food, hydration, pre-existing medical conditions, and more. It is recommended to use the knowledge from this video to become more involved with your lab results and evaluate your numbers to speak with your doctor.   Diseases of Our Time - Overview  Clinical staff conducted group or individual video education with verbal and written material and guidebook.  Patient learns that according to the CDC, 50% to 70% of chronic diseases (such as obesity, type 2 diabetes, elevated lipids, hypertension, and heart disease) are avoidable through lifestyle improvements including healthier food choices, listening to satiety cues, and increased physical activity.  Sleep Disorders Clinical staff conducted group or individual video education with verbal and written material and guidebook.  Patient learns how good quality and duration of sleep are important to overall health and well-being. Patient also learns about sleep disorders and how they impact health  along with recommendations to address them, including discussing with a physician.  Nutrition  Dining Out - Part 2 Clinical staff conducted group or individual video education with verbal and written material and guidebook.  Patient learns how to plan ahead and communicate in order to maximize  their dining experience in a healthy and nutritious manner. Included are recommended food choices based on the type of restaurant the patient is visiting.   Fueling a Banker conducted group or individual video education with verbal and written material and guidebook.  There is a strong connection between our food choices and our health. Diseases like obesity and type 2 diabetes are very prevalent and are in large-part due to lifestyle choices. The Pritikin Eating Plan provides plenty of food and hunger-curbing satisfaction. It is easy to follow, affordable, and helps reduce health risks.  Menu Workshop  Clinical staff conducted group or individual video education with verbal and written material and guidebook.  Patient learns that restaurant meals can sabotage health goals because they are often packed with calories, fat, sodium, and sugar. Recommendations include strategies to plan ahead and to communicate with the manager, chef, or server to help order a healthier meal.  Planning Your Eating Strategy  Clinical staff conducted group or individual video education with verbal and written material and guidebook.  Patient learns about the Pritikin Eating Plan and its benefit of reducing the risk of disease. The Pritikin Eating Plan does not focus on calories. Instead, it emphasizes high-quality, nutrient-rich foods. By knowing the characteristics of the foods, we choose, we can determine their calorie density and make informed decisions.  Targeting Your Nutrition Priorities  Clinical staff conducted group or individual video education with verbal and written material and guidebook.   Patient learns that lifestyle habits have a tremendous impact on disease risk and progression. This video provides eating and physical activity recommendations based on your personal health goals, such as reducing LDL cholesterol, losing weight, preventing or controlling type 2 diabetes, and reducing high blood pressure.  Vitamins and Minerals  Clinical staff conducted group or individual video education with verbal and written material and guidebook.  Patient learns different ways to obtain key vitamins and minerals, including through a recommended healthy diet. It is important to discuss all supplements you take with your doctor.   Healthy Mind-Set    Smoking Cessation  Clinical staff conducted group or individual video education with verbal and written material and guidebook.  Patient learns that cigarette smoking and tobacco addiction pose a serious health risk which affects millions of people. Stopping smoking will significantly reduce the risk of heart disease, lung disease, and many forms of cancer. Recommended strategies for quitting are covered, including working with your doctor to develop a successful plan.  Culinary   Becoming a Set Designer conducted group or individual video education with verbal and written material and guidebook.  Patient learns that cooking at home can be healthy, cost-effective, quick, and puts them in control. Keys to cooking healthy recipes will include looking at your recipe, assessing your equipment needs, planning ahead, making it simple, choosing cost-effective seasonal ingredients, and limiting the use of added fats, salts, and sugars.  Cooking - Breakfast and Snacks  Clinical staff conducted group or individual video education with verbal and written material and guidebook.  Patient learns how important breakfast is to satiety and nutrition through the entire day. Recommendations include key foods to eat during breakfast to help  stabilize blood sugar levels and to prevent overeating at meals later in the day. Planning ahead is also a key component.  Cooking - Educational Psychologist conducted group or individual video education with verbal and written material and guidebook.  Patient learns eating strategies to improve  overall health, including an approach to cook more at home. Recommendations include thinking of animal protein as a side on your plate rather than center stage and focusing instead on lower calorie dense options like vegetables, fruits, whole grains, and plant-based proteins, such as beans. Making sauces in large quantities to freeze for later and leaving the skin on your vegetables are also recommended to maximize your experience.  Cooking - Healthy Salads and Dressing Clinical staff conducted group or individual video education with verbal and written material and guidebook.  Patient learns that vegetables, fruits, whole grains, and legumes are the foundations of the Pritikin Eating Plan. Recommendations include how to incorporate each of these in flavorful and healthy salads, and how to create homemade salad dressings. Proper handling of ingredients is also covered. Cooking - Soups and [employer] - Soups and Desserts Clinical staff conducted group or individual video education with verbal and written material and guidebook.  Patient learns that Pritikin soups and desserts make for easy, nutritious, and delicious snacks and meal components that are low in sodium, fat, sugar, and calorie density, while high in vitamins, minerals, and filling fiber. Recommendations include simple and healthy ideas for soups and desserts.   Overview     The Pritikin Solution Program Overview Clinical staff conducted group or individual video education with verbal and written material and guidebook.  Patient learns that the results of the Pritikin Program have been documented in more than 100 articles published  in peer-reviewed journals, and the benefits include reducing risk factors for (and, in some cases, even reversing) high cholesterol, high blood pressure, type 2 diabetes, obesity, and more! An overview of the three key pillars of the Pritikin Program will be covered: eating well, doing regular exercise, and having a healthy mind-set.  WORKSHOPS  Exercise: Exercise Basics: Building Your Action Plan Clinical staff led group instruction and group discussion with PowerPoint presentation and patient guidebook. To enhance the learning environment the use of posters, models and videos may be added. At the conclusion of this workshop, patients will comprehend the difference between physical activity and exercise, as well as the benefits of incorporating both, into their routine. Patients will understand the FITT (Frequency, Intensity, Time, and Type) principle and how to use it to build an exercise action plan. In addition, safety concerns and other considerations for exercise and cardiac rehab will be addressed by the presenter. The purpose of this lesson is to promote a comprehensive and effective weekly exercise routine in order to improve patients' overall level of fitness.   Managing Heart Disease: Your Path to a Healthier Heart Clinical staff led group instruction and group discussion with PowerPoint presentation and patient guidebook. To enhance the learning environment the use of posters, models and videos may be added.At the conclusion of this workshop, patients will understand the anatomy and physiology of the heart. Additionally, they will understand how Pritikin's three pillars impact the risk factors, the progression, and the management of heart disease.  The purpose of this lesson is to provide a high-level overview of the heart, heart disease, and how the Pritikin lifestyle positively impacts risk factors.  Exercise Biomechanics Clinical staff led group instruction and group discussion  with PowerPoint presentation and patient guidebook. To enhance the learning environment the use of posters, models and videos may be added. Patients will learn how the structural parts of their bodies function and how these functions impact their daily activities, movement, and exercise. Patients will learn how to promote a neutral  spine, learn how to manage pain, and identify ways to improve their physical movement in order to promote healthy living. The purpose of this lesson is to expose patients to common physical limitations that impact physical activity. Participants will learn practical ways to adapt and manage aches and pains, and to minimize their effect on regular exercise. Patients will learn how to maintain good posture while sitting, walking, and lifting.  Balance Training and Fall Prevention  Clinical staff led group instruction and group discussion with PowerPoint presentation and patient guidebook. To enhance the learning environment the use of posters, models and videos may be added. At the conclusion of this workshop, patients will understand the importance of their sensorimotor skills (vision, proprioception, and the vestibular system) in maintaining their ability to balance as they age. Patients will apply a variety of balancing exercises that are appropriate for their current level of function. Patients will understand the common causes for poor balance, possible solutions to these problems, and ways to modify their physical environment in order to minimize their fall risk. The purpose of this lesson is to teach patients about the importance of maintaining balance as they age and ways to minimize their risk of falling.  WORKSHOPS   Nutrition:  Fueling a Ship Broker led group instruction and group discussion with PowerPoint presentation and patient guidebook. To enhance the learning environment the use of posters, models and videos may be added. Patients will  review the foundational principles of the Pritikin Eating Plan and understand what constitutes a serving size in each of the food groups. Patients will also learn Pritikin-friendly foods that are better choices when away from home and review make-ahead meal and snack options. Calorie density will be reviewed and applied to three nutrition priorities: weight maintenance, weight loss, and weight gain. The purpose of this lesson is to reinforce (in a group setting) the key concepts around what patients are recommended to eat and how to apply these guidelines when away from home by planning and selecting Pritikin-friendly options. Patients will understand how calorie density may be adjusted for different weight management goals.  Mindful Eating  Clinical staff led group instruction and group discussion with PowerPoint presentation and patient guidebook. To enhance the learning environment the use of posters, models and videos may be added. Patients will briefly review the concepts of the Pritikin Eating Plan and the importance of low-calorie dense foods. The concept of mindful eating will be introduced as well as the importance of paying attention to internal hunger signals. Triggers for non-hunger eating and techniques for dealing with triggers will be explored. The purpose of this lesson is to provide patients with the opportunity to review the basic principles of the Pritikin Eating Plan, discuss the value of eating mindfully and how to measure internal cues of hunger and fullness using the Hunger Scale. Patients will also discuss reasons for non-hunger eating and learn strategies to use for controlling emotional eating.  Targeting Your Nutrition Priorities Clinical staff led group instruction and group discussion with PowerPoint presentation and patient guidebook. To enhance the learning environment the use of posters, models and videos may be added. Patients will learn how to determine their genetic  susceptibility to disease by reviewing their family history. Patients will gain insight into the importance of diet as part of an overall healthy lifestyle in mitigating the impact of genetics and other environmental insults. The purpose of this lesson is to provide patients with the opportunity to assess their personal nutrition priorities  by looking at their family history, their own health history and current risk factors. Patients will also be able to discuss ways of prioritizing and modifying the Pritikin Eating Plan for their highest risk areas  Menu  Clinical staff led group instruction and group discussion with PowerPoint presentation and patient guidebook. To enhance the learning environment the use of posters, models and videos may be added. Using menus brought in from [employer], or printed from [employer], patients will apply the Pritikin dining out guidelines that were presented in the [employer] video. Patients will also be able to practice these guidelines in a variety of provided scenarios. The purpose of this lesson is to provide patients with the opportunity to practice hands-on learning of the Pritikin Dining Out guidelines with actual menus and practice scenarios.  Label Reading Clinical staff led group instruction and group discussion with PowerPoint presentation and patient guidebook. To enhance the learning environment the use of posters, models and videos may be added. Patients will review and discuss the Pritikin label reading guidelines presented in Pritikin's Label Reading Educational series video. Using fool labels brought in from local grocery stores and markets, patients will apply the label reading guidelines and determine if the packaged food meet the Pritikin guidelines. The purpose of this lesson is to provide patients with the opportunity to review, discuss, and practice hands-on learning of the Pritikin Label Reading guidelines with actual  packaged food labels. Cooking School  Pritikin's [employer] are designed to teach patients ways to prepare quick, simple, and affordable recipes at home. The importance of nutrition's role in chronic disease risk reduction is reflected in its emphasis in the overall Pritikin program. By learning how to prepare essential core Pritikin Eating Plan recipes, patients will increase control over what they eat; be able to customize the flavor of foods without the use of added salt, sugar, or fat; and improve the quality of the food they consume. By learning a set of core recipes which are easily assembled, quickly prepared, and affordable, patients are more likely to prepare more healthy foods at home. These workshops focus on convenient breakfasts, simple entres, side dishes, and desserts which can be prepared with minimal effort and are consistent with nutrition recommendations for cardiovascular risk reduction. Cooking [employer] are taught by a armed forces logistics/support/administrative officer (RD) who has been trained by the [employer]. The chef or RD has a clear understanding of the importance of minimizing - if not completely eliminating - added fat, sugar, and sodium in recipes. Throughout the series of Cooking School Workshop sessions, patients will learn about healthy ingredients and efficient methods of cooking to build confidence in their capability to prepare    Cooking School weekly topics:  Adding Flavor- Sodium-Free  Fast and Healthy Breakfasts  Powerhouse Plant-Based Proteins  Satisfying Salads and Dressings  Simple Sides and Sauces  International Cuisine-Spotlight on the [employer] Zones  Delicious Desserts  Savory Soups  [employer] - Meals in a Astronomer Appetizers and Snacks  Comforting Weekend Breakfasts  One-Pot Wonders   Fast Evening Meals  Landscape Architect Your Pritikin Plate  WORKSHOPS   Healthy Mindset (Psychosocial):  Focused Goals,  Sustainable Changes Clinical staff led group instruction and group discussion with PowerPoint presentation and patient guidebook. To enhance the learning environment the use of posters, models and videos may be added. Patients will be able to apply effective goal setting strategies to establish at least one personal  goal, and then take consistent, meaningful action toward that goal. They will learn to identify common barriers to achieving personal goals and develop strategies to overcome them. Patients will also gain an understanding of how our mind-set can impact our ability to achieve goals and the importance of cultivating a positive and growth-oriented mind-set. The purpose of this lesson is to provide patients with a deeper understanding of how to set and achieve personal goals, as well as the tools and strategies needed to overcome common obstacles which may arise along the way.  From Head to Heart: The Power of a Healthy Outlook  Clinical staff led group instruction and group discussion with PowerPoint presentation and patient guidebook. To enhance the learning environment the use of posters, models and videos may be added. Patients will be able to recognize and describe the impact of emotions and mood on physical health. They will discover the importance of self-care and explore self-care practices which may work for them. Patients will also learn how to utilize the 4 C's to cultivate a healthier outlook and better manage stress and challenges. The purpose of this lesson is to demonstrate to patients how a healthy outlook is an essential part of maintaining good health, especially as they continue their cardiac rehab journey.  Healthy Sleep for a Healthy Heart Clinical staff led group instruction and group discussion with PowerPoint presentation and patient guidebook. To enhance the learning environment the use of posters, models and videos may be added. At the conclusion of this workshop, patients  will be able to demonstrate knowledge of the importance of sleep to overall health, well-being, and quality of life. They will understand the symptoms of, and treatments for, common sleep disorders. Patients will also be able to identify daytime and nighttime behaviors which impact sleep, and they will be able to apply these tools to help manage sleep-related challenges. The purpose of this lesson is to provide patients with a general overview of sleep and outline the importance of quality sleep. Patients will learn about a few of the most common sleep disorders. Patients will also be introduced to the concept of "sleep hygiene," and discover ways to self-manage certain sleeping problems through simple daily behavior changes. Finally, the workshop will motivate patients by clarifying the links between quality sleep and their goals of heart-healthy living.   Recognizing and Reducing Stress Clinical staff led group instruction and group discussion with PowerPoint presentation and patient guidebook. To enhance the learning environment the use of posters, models and videos may be added. At the conclusion of this workshop, patients will be able to understand the types of stress reactions, differentiate between acute and chronic stress, and recognize the impact that chronic stress has on their health. They will also be able to apply different coping mechanisms, such as reframing negative self-talk. Patients will have the opportunity to practice a variety of stress management techniques, such as deep abdominal breathing, progressive muscle relaxation, and/or guided imagery.  The purpose of this lesson is to educate patients on the role of stress in their lives and to provide healthy techniques for coping with it.  Learning Barriers/Preferences:  Learning Barriers/Preferences - 10/16/24 1350       Learning Barriers/Preferences   Learning Barriers Sight    Learning Preferences Audio;Computer/Internet;Group  Instruction;Individual Instruction;Pictoral;Skilled Demonstration;Verbal Instruction;Video;Written Material          Education Topics:  Knowledge Questionnaire Score:  Knowledge Questionnaire Score - 10/16/24 1351       Knowledge Questionnaire Score  Pre Score 18/24          Core Components/Risk Factors/Patient Goals at Admission:  Personal Goals and Risk Factors at Admission - 10/16/24 0856       Core Components/Risk Factors/Patient Goals on Admission   Diabetes Yes    Intervention Provide education about signs/symptoms and action to take for hypo/hyperglycemia.;Provide education about proper nutrition, including hydration, and aerobic/resistive exercise prescription along with prescribed medications to achieve blood glucose in normal ranges: Fasting glucose 65-99 mg/dL    Expected Outcomes Short Term: Participant verbalizes understanding of the signs/symptoms and immediate care of hyper/hypoglycemia, proper foot care and importance of medication, aerobic/resistive exercise and nutrition plan for blood glucose control.;Long Term: Attainment of HbA1C < 7%.    Hypertension Yes    Intervention Provide education on lifestyle modifcations including regular physical activity/exercise, weight management, moderate sodium restriction and increased consumption of fresh fruit, vegetables, and low fat dairy, alcohol moderation, and smoking cessation.;Monitor prescription use compliance.    Expected Outcomes Long Term: Maintenance of blood pressure at goal levels.;Short Term: Continued assessment and intervention until BP is < 140/107mm HG in hypertensive participants. < 130/29mm HG in hypertensive participants with diabetes, heart failure or chronic kidney disease.    Lipids Yes    Intervention Provide education and support for participant on nutrition & aerobic/resistive exercise along with prescribed medications to achieve LDL 70mg , HDL >40mg .    Expected Outcomes Short Term: Participant  states understanding of desired cholesterol values and is compliant with medications prescribed. Participant is following exercise prescription and nutrition guidelines.;Long Term: Cholesterol controlled with medications as prescribed, with individualized exercise RX and with personalized nutrition plan. Value goals: LDL < 70mg , HDL > 40 mg.          Core Components/Risk Factors/Patient Goals Review:    Core Components/Risk Factors/Patient Goals at Discharge (Final Review):    ITP Comments:  ITP Comments     Row Name 10/16/24 0835           ITP Comments Wilbert Bihari, MD: Medical Director. Introduction to the [facility] program / Intensive Cardiac Rehab.  Initial orientation packet reviewed with the patient.          Comments: Participant attended orientation for the cardiac rehabilitation program on  10/16/2024  to perform initial intake and exercise walk test. Patient introduced to the [facility] education and orientation packet was reviewed. Completed 6-minute walk test, measurements, initial ITP, and exercise prescription. Vital signs stable. Telemetry-normal sinus rhythm, asymptomatic.   Service time was from 7:50 to 10:13.

## 2024-10-17 ENCOUNTER — Telehealth: Payer: Self-pay | Admitting: Cardiology

## 2024-10-17 NOTE — Telephone Encounter (Signed)
 Pt c/o medication issue:  1. Name of Medication:   ranolazine  (RANEXA ) 1000 MG SR tablet    2. How are you currently taking this medication (dosage and times per day)?   Take 1 tablet (1,000 mg total) by mouth 2 (two) times daily.    3. Are you having a reaction (difficulty breathing--STAT)? Pt is feeling dizzy and having muscle pain in legs   4. What is your medication issue? Pt would like to know if his dosage could be reduced to either 1000mg  once daily, or 500 mg twice daily. Pt is not currently dizzy, but took BP when he was dizzy and it was 86/49. Please advise.   Pt c/o BP issue: STAT if pt c/o blurred vision, one-sided weakness or slurred speech.  STAT if BP is GREATER than 180/120 TODAY.  STAT if BP is LESS than 90/60 and SYMPTOMATIC TODAY  1. What is your BP concern? Hypotension   2. Have you taken any BP medication today?yes   3. What are your last 5 BP readings? 86/49 about an hour ago, currently 140/85  4. Are you having any other symptoms (ex. Dizziness, headache, blurred vision, passed out)? Dizziness

## 2024-10-17 NOTE — Telephone Encounter (Signed)
 Recommendations reviewed with pt as per Dr. Kem Parkinson note.  Pt verbalized understanding and had no additional questions.

## 2024-10-22 ENCOUNTER — Encounter (HOSPITAL_COMMUNITY)
Admission: RE | Admit: 2024-10-22 | Discharge: 2024-10-22 | Disposition: A | Discharge status: Home / Self Care | Source: Ambulatory Visit | Attending: Cardiology | Admitting: Cardiology

## 2024-10-22 DIAGNOSIS — I25119 Atherosclerotic heart disease of native coronary artery with unspecified angina pectoris: Secondary | ICD-10-CM | POA: Diagnosis not present

## 2024-10-22 DIAGNOSIS — I2089 Other forms of angina pectoris: Secondary | ICD-10-CM

## 2024-10-22 LAB — GLUCOSE, CAPILLARY
Glucose-Capillary: 127 mg/dL — ABNORMAL HIGH (ref 70–99)
Glucose-Capillary: 101 mg/dL — ABNORMAL HIGH (ref 70–99)

## 2024-10-22 NOTE — Progress Notes (Signed)
 Daily Session Note  Patient Details  Name: Vincent Stevens MRN: [id number] Date of Birth: [birth year] Referring Provider:   Flowsheet Row INTENSIVE CARDIAC REHAB ORIENT from 10/16/2024 in [facility]  Referring Provider Jennifer Crape, MD    Encounter Date: 10/22/2024  Check In:  Session Check In - 10/22/24 9160       Check-In   Supervising physician immediately available to respond to emergencies [facility] MD immediately available    Physician(s) Katlyn, West, NP    Location [facility]    Staff Present Alm Parkins, MS, ACSM-CEP, CCRP, Exercise Physiologist;Cleburn Maiolo, RN, Valere Music, RN, Avonne Gal, MS, ACSM-CEP, Exercise Physiologist;Jetta Walker BS, ACSM-CEP, Exercise Physiologist    Virtual Visit No    Medication changes reported     No    Fall or balance concerns reported    No    Tobacco Cessation No Change    Warm-up and Cool-down Performed as group-led instruction   CRP2 orientation today   Resistance Training Performed Yes    VAD Patient? No    PAD/SET Patient? No      Pain Assessment   Currently in Pain? No/denies    Pain Score 0-No pain    Multiple Pain Sites No          Capillary Blood Glucose: Results for orders placed or performed during the hospital encounter of 10/22/24 (from the past 24 hours)  Glucose, capillary     Status: Abnormal   Collection Time: 10/22/24  9:09 AM  Result Value Ref Range   Glucose-Capillary 127 (H) 70 - 99 mg/dL  Glucose, capillary     Status: Abnormal   Collection Time: 10/22/24 10:05 AM  Result Value Ref Range   Glucose-Capillary 101 (H) 70 - 99 mg/dL     Exercise Prescription Changes - 10/22/24 1400       Response to Exercise   Blood Pressure (Admit) 114/60    Blood Pressure (Exercise) 120/60    Blood Pressure (Exit) 104/60    Heart Rate (Admit) 70 bpm    Heart Rate (Exercise) 71 bpm    Heart Rate (Exit) 71 bpm    Rating  of Perceived Exertion (Exercise) 11    Symptoms None    Comments Pt's first day in the CRP2 program    Duration Continue with 30 min of aerobic exercise without signs/symptoms of physical distress.    Intensity THRR unchanged      Progression   Progression Continue to progress workloads to maintain intensity without signs/symptoms of physical distress.    Average METs 2      Resistance Training   Training Prescription Yes    Weight 2 lbs    Reps 10-15    Time 5 Minutes      Interval Training   Interval Training No      Recumbant Bike   Level 1    RPM 55    Watts 11    Minutes 15    METs 2.9      NuStep   Level 1    SPM 78    Minutes 15    METs 2.1          Social History   Tobacco Use  Smoking Status Former   Current packs/day: 0.00   Types: Cigarettes   Quit date: 11/29/1980   Years since quitting: 43.9  Smokeless Tobacco Never    Goals Met:  Exercise tolerated well  No report of concerns or symptoms today Strength training completed today  Goals Unmet:  Not Applicable  Comments: Pt started cardiac rehab today.  Pt tolerated light exercise without difficulty. VSS, telemetry-Sinus Rhythm with arrhythmia, asymptomatic.  Medication list reconciled. Ranexa  has been discontinued. Pt denies barriers to medicaiton compliance.  Zell has a CBG meter now.PSYCHOSOCIAL ASSESSMENT:  PHQ-6. Pt exhibits positive coping skills, hopeful outlook with supportive family. No psychosocial needs identified at this time, no psychosocial interventions necessary.    Pt enjoys TV and maintaining his yard.   Pt oriented to exercise equipment and routine.    Understanding verbalized. Hadassah Elpidio Quan RN BSN    Dr. Wilbert Bihari is Medical Director for Cardiac Rehab at [facility].

## 2024-10-23 ENCOUNTER — Ambulatory Visit: Payer: Self-pay | Admitting: Cardiology

## 2024-10-23 DIAGNOSIS — L578 Other skin changes due to chronic exposure to nonionizing radiation: Secondary | ICD-10-CM | POA: Diagnosis not present

## 2024-10-23 DIAGNOSIS — L821 Other seborrheic keratosis: Secondary | ICD-10-CM | POA: Diagnosis not present

## 2024-10-24 ENCOUNTER — Encounter (HOSPITAL_COMMUNITY)
Admission: RE | Admit: 2024-10-24 | Discharge: 2024-10-24 | Disposition: A | Discharge status: Home / Self Care | Source: Ambulatory Visit | Attending: Cardiology | Admitting: Cardiology

## 2024-10-24 DIAGNOSIS — I2089 Other forms of angina pectoris: Secondary | ICD-10-CM

## 2024-10-24 DIAGNOSIS — I25119 Atherosclerotic heart disease of native coronary artery with unspecified angina pectoris: Secondary | ICD-10-CM | POA: Diagnosis not present

## 2024-10-24 LAB — GLUCOSE, CAPILLARY
Glucose-Capillary: 87 mg/dL (ref 70–99)
Glucose-Capillary: 115 mg/dL — ABNORMAL HIGH (ref 70–99)

## 2024-10-26 ENCOUNTER — Encounter (HOSPITAL_COMMUNITY)

## 2024-10-29 ENCOUNTER — Encounter (HOSPITAL_COMMUNITY)
Admission: RE | Admit: 2024-10-29 | Discharge: 2024-10-29 | Disposition: A | Source: Ambulatory Visit | Attending: Cardiology

## 2024-10-29 DIAGNOSIS — I2089 Other forms of angina pectoris: Secondary | ICD-10-CM | POA: Diagnosis present

## 2024-10-29 LAB — GLUCOSE, CAPILLARY: Glucose-Capillary: 99 mg/dL (ref 70–99)

## 2024-10-29 NOTE — Progress Notes (Signed)
 Cardiac Individual Treatment Plan  Patient Details  Name: SHOGO LARKEY MRN: [id number] Date of Birth: 1946-08-03 Referring Provider:   Flowsheet Row INTENSIVE CARDIAC REHAB ORIENT from 10/16/2024 in [facility]  Referring Provider Jennifer Crape, MD    Initial Encounter Date:  Flowsheet Row INTENSIVE CARDIAC REHAB ORIENT from 10/16/2024 in [facility]  Date 10/16/24    Visit Diagnosis: Chronic stable angina  Patient's Home Medications on Admission:  Current Outpatient Medications:    alfuzosin  (UROXATRAL ) 10 MG 24 hr tablet, Take 1 tablet (10 mg total) by mouth at bedtime., Disp: 30 tablet, Rfl: 1   aspirin  EC 81 MG tablet, Take 1 tablet (81 mg total) by mouth daily., Disp: , Rfl:    azelastine  (ASTELIN ) 0.1 % nasal spray, Place 2 sprays into both nostrils 2 (two) times daily., Disp: 30 mL, Rfl: 12   benzonatate  (TESSALON ) 200 MG capsule, Take 1 capsule (200 mg total) by mouth 2 (two) times daily as needed for cough., Disp: 20 capsule, Rfl: 0   Blood Glucose Monitoring Suppl DEVI, 1 each by Does not apply route as directed. Dispense based on patient and insurance preference. Use up to four times daily as directed. DX E11.69, Disp: 1 each, Rfl: 0   finasteride  (PROSCAR ) 5 MG tablet, Take 1 tablet (5 mg total) by mouth daily., Disp: 30 tablet, Rfl: 2   Glucose Blood (BLOOD GLUCOSE TEST STRIPS) STRP, 1 each by Does not apply route as directed. Dispense based on patient and insurance preference. Use up to four times daily as directed. Dx E11.9., Disp: 100 strip, Rfl: 0   ibuprofen  (ADVIL ,MOTRIN ) 200 MG tablet, Take 200 mg by mouth 2 (two) times daily as needed (arthritis apin). , Disp: , Rfl:    isosorbide  mononitrate (IMDUR ) 30 MG 24 hr tablet, Take 1 tablet (30 mg total) by mouth every morning., Disp: 30 tablet, Rfl: 2   Lancet Device MISC, 1 each by Does not apply route  as directed. Dispense based on patient and insurance preference. Use up to four times daily as directed. Dx E11.69, Disp: 1 each, Rfl: 0   Lancets MISC, 1 each by Does not apply route as directed. Dispense based on patient and insurance preference. Use up to four times daily as directed. E11.69, Disp: 100 each, Rfl: 0   metFORMIN  (GLUCOPHAGE ) 500 MG tablet, Take 1 tablet (500 mg total) by mouth daily with breakfast., Disp: 90 tablet, Rfl: 1   metoprolol  succinate (TOPROL -XL) 25 MG 24 hr tablet, Take 1 tablet (25 mg total) by mouth at bedtime. TAKE WITH OR IMMEDIATELY FOLLOWING A MEAL., Disp: 90 tablet, Rfl: 3   Multiple Vitamin (MULTIVITAMIN WITH MINERALS) TABS tablet, Take 1 tablet by mouth at bedtime., Disp: , Rfl:    nitroGLYCERIN  (NITROSTAT ) 0.4 MG SL tablet, Place 0.4 mg under the tongue every 5 (five) minutes as needed for chest pain., Disp: , Rfl:    simvastatin  (ZOCOR ) 80 MG tablet, TAKE 1 TABLET DAILY. PATIENT NEEDS AN APPOINTMENT FOR FURTHER REFILLS. 3 RD/FINAL ATTEMPT, Disp: 90 tablet, Rfl: 1   triamcinolone  ointment (KENALOG ) 0.5 %, APPLY 1 APPLICATION TOPICALLY 2 (TWO) TIMES DAILY., Disp: 30 g, Rfl: 5  Past Medical History: Past Medical History:  Diagnosis Date   Allergic rhinitis 06/17/2021   Allergy    Angina pectoris 11/02/2023   Angina, class III 03/01/2018   Arthralgia of right knee 08/07/2024   Atherosclerosis  of native coronary artery of native heart with stable angina pectoris 08/12/2024   BPH (benign prostatic hyperplasia) 09/19/2009   Qualifier: Diagnosis of  By: Jame  MD, Maude FALCON    Chest pain of uncertain etiology 10/20/2023   Coronary artery disease involving native coronary artery of native heart with angina pectoris    Diabetes mellitus due to underlying condition with unspecified complications (HCC) 09/13/2022   Dyslipidemia 08/14/2010   Qualifier: Diagnosis of  By: Jame  MD, Maude FALCON    Dyslipidemia associated with type 2 diabetes mellitus (HCC)  08/14/2010   Lab Results      Component    Value    Date           CHOL    134    04/19/2018           HDL    43    04/19/2018           LDLCALC    70    04/19/2018           TRIG    105    04/19/2018           CHOLHDL    3.1    04/19/2018           Dysuria 08/14/2024   Essential hypertension 01/17/2009   Qualifier: Diagnosis of  By: Jame  MD, Maude FALCON    GERD (gastroesophageal reflux disease) 09/09/2022   Gout    History of colonic polyps 09/27/2008   Qualifier: Diagnosis of  By: Jame  MD, Maude FALCON  Initial colonoscopy 2003.  Hyperplastic polyps only Followup colonoscopy 2008.  Normal  10 year interval recommended    Hx of CABG 03/01/2018   Left main coronary artery disease    Melanoma (HCC)    Near syncope 08/14/2024   Osteoarthritis of knee 08/07/2024   Pain in both lower extremities 08/15/2018   Prediabetes 09/13/2022   Sinus bradycardia 08/14/2024    Tobacco Use: Social History   Tobacco Use  Smoking Status Former   Current packs/day: 0.00   Types: Cigarettes   Quit date: 11/29/1980   Years since quitting: 43.9  Smokeless Tobacco Never    Labs: Review Flowsheet  More data exists      Latest Ref Rng & Units 09/09/2022 03/14/2023 09/12/2023 03/01/2024 08/15/2024  Labs for ITP Cardiac and Pulmonary Rehab  Cholestrol 100 - 199 mg/dL 864  - 862  858  -  LDL (calc) 0 - 99 mg/dL 70  - 71  74  -  HDL-C >39 mg/dL 53.19  - 46.29  53  -  Trlycerides 0 - 149 mg/dL 07.9  - 35.9  68  -  Hemoglobin A1c 4.8 - 5.6 % 6.3  5.6  6.1  5.9  5.9     Capillary Blood Glucose: Lab Results  Component Value Date   GLUCAP 99 10/29/2024   GLUCAP 115 (H) 10/24/2024   GLUCAP 87 10/24/2024   GLUCAP 101 (H) 10/22/2024   GLUCAP 127 (H) 10/22/2024     Exercise Target Goals: Exercise Program Goal: Individual exercise prescription set using results from initial 6 min walk test and THRR while considering  patient's activity barriers and safety.   Exercise Prescription Goal: Initial  exercise prescription builds to 30-45 minutes a day of aerobic activity, 2-3 days per week.  Home exercise guidelines will be given to patient during program as part of exercise prescription that the participant will acknowledge.  Activity Barriers &  Risk Stratification:  Activity Barriers & Cardiac Risk Stratification - 10/16/24 0901       Activity Barriers & Cardiac Risk Stratification   Activity Barriers Balance Concerns;Arthritis;Joint Problems;Back Problems;Deconditioning;Muscular Weakness;Chest Pain/Angina    Cardiac Risk Stratification High          6 Minute Walk:  6 Minute Walk     Row Name 10/16/24 1353         6 Minute Walk   Phase Initial  Used Go-cart     Distance 767 feet     Walk Time 6 minutes     # of Rest Breaks 1  3:58-5:40     MPH 1.5     METS 1.52     RPE 11     Perceived Dyspnea  1     VO2 Peak 5.33     Symptoms Yes (comment)     Comments chronic pain posterior knee 5/10; chronic shoulders 5/10     Resting HR 56 bpm     Resting BP 93/51     Resting Oxygen Saturation  97 %     Exercise Oxygen Saturation  during 6 min walk 99 %     Max Ex. HR 102 bpm     Max Ex. BP 152/67     2 Minute Post BP 141/67        Oxygen Initial Assessment:   Oxygen Re-Evaluation:   Oxygen Discharge (Final Oxygen Re-Evaluation):   Initial Exercise Prescription:  Initial Exercise Prescription - 10/16/24 0800       Date of Initial Exercise RX and Referring Provider   Date 10/16/24    Referring Provider Jennifer Crape, MD    Expected Discharge Date 01/10/24      Recumbant Bike   Level 1    RPM 50    Watts 10    Minutes 15    METs 1.5      NuStep   Level 1    SPM 70    Minutes 15    METs 1.5      Prescription Details   Frequency (times per week) 3    Duration Progress to 30 minutes of continuous aerobic without signs/symptoms of physical distress      Intensity   THRR 40-80% of Max Heartrate 56-113    Ratings of Perceived Exertion 11-13     Perceived Dyspnea 0-4      Progression   Progression Continue progressive overload as per policy without signs/symptoms or physical distress.      Resistance Training   Training Prescription Yes    Weight 2 lbs    Reps 10-15          Perform Capillary Blood Glucose checks as needed.  Exercise Prescription Changes:   Exercise Prescription Changes     Row Name 10/22/24 1400             Response to Exercise   Blood Pressure (Admit) 114/60       Blood Pressure (Exercise) 120/60       Blood Pressure (Exit) 104/60       Heart Rate (Admit) 70 bpm       Heart Rate (Exercise) 71 bpm       Heart Rate (Exit) 71 bpm       Rating of Perceived Exertion (Exercise) 11       Symptoms None       Comments Pt's first day in the CRP2 program       Duration Continue  with 30 min of aerobic exercise without signs/symptoms of physical distress.       Intensity THRR unchanged         Progression   Progression Continue to progress workloads to maintain intensity without signs/symptoms of physical distress.       Average METs 2         Resistance Training   Training Prescription Yes       Weight 2 lbs       Reps 10-15       Time 5 Minutes         Interval Training   Interval Training No         Recumbant Bike   Level 1       RPM 55       Watts 11       Minutes 15       METs 2.9         NuStep   Level 1       SPM 78       Minutes 15       METs 2.1          Exercise Comments:   Exercise Comments     Row Name 10/22/24 1429           Exercise Comments Pt's first day in the CRP2 program. Pt exercised without complaints and is off to a good start.          Exercise Goals and Review:   Exercise Goals     Row Name 10/16/24 0902             Exercise Goals   Increase Physical Activity Yes       Intervention Provide advice, education, support and counseling about physical activity/exercise needs.;Develop an individualized exercise prescription for aerobic and  resistive training based on initial evaluation findings, risk stratification, comorbidities and participant's personal goals.       Expected Outcomes Short Term: Attend rehab on a regular basis to increase amount of physical activity.;Long Term: Add in home exercise to make exercise part of routine and to increase amount of physical activity.;Long Term: Exercising regularly at least 3-5 days a week.       Increase Strength and Stamina Yes       Intervention Provide advice, education, support and counseling about physical activity/exercise needs.;Develop an individualized exercise prescription for aerobic and resistive training based on initial evaluation findings, risk stratification, comorbidities and participant's personal goals.       Expected Outcomes Short Term: Increase workloads from initial exercise prescription for resistance, speed, and METs.;Short Term: Perform resistance training exercises routinely during rehab and add in resistance training at home;Long Term: Improve cardiorespiratory fitness, muscular endurance and strength as measured by increased METs and functional capacity ( )       Able to understand and use rate of perceived exertion (RPE) scale Yes       Intervention Provide education and explanation on how to use RPE scale       Expected Outcomes Short Term: Able to use RPE daily in rehab to express subjective intensity level;Long Term:  Able to use RPE to guide intensity level when exercising independently       Knowledge and understanding of Target Heart Rate Range (THRR) Yes       Intervention Provide education and explanation of THRR including how the numbers were predicted and where they are located for reference       Expected Outcomes  Short Term: Able to state/look up THRR;Long Term: Able to use THRR to govern intensity when exercising independently;Short Term: Able to use daily as guideline for intensity in rehab       Understanding of Exercise Prescription Yes        Intervention Provide education, explanation, and written materials on patient's individual exercise prescription       Expected Outcomes Short Term: Able to explain program exercise prescription;Long Term: Able to explain home exercise prescription to exercise independently          Exercise Goals Re-Evaluation :  Exercise Goals Re-Evaluation     Row Name 10/22/24 1427             Exercise Goal Re-Evaluation   Exercise Goals Review Increase Physical Activity;Understanding of Exercise Prescription;Increase Strength and Stamina;Knowledge and understanding of Target Heart Rate Range (THRR);Able to understand and use rate of perceived exertion (RPE) scale       Comments Pt's first day in the CRP2 program. Pt understands the exercise Rx, RPE sclae and THRR.       Expected Outcomes Will continue to monitor the patient and progress exercise workloads as tolerated.          Discharge Exercise Prescription (Final Exercise Prescription Changes):  Exercise Prescription Changes - 10/22/24 1400       Response to Exercise   Blood Pressure (Admit) 114/60    Blood Pressure (Exercise) 120/60    Blood Pressure (Exit) 104/60    Heart Rate (Admit) 70 bpm    Heart Rate (Exercise) 71 bpm    Heart Rate (Exit) 71 bpm    Rating of Perceived Exertion (Exercise) 11    Symptoms None    Comments Pt's first day in the CRP2 program    Duration Continue with 30 min of aerobic exercise without signs/symptoms of physical distress.    Intensity THRR unchanged      Progression   Progression Continue to progress workloads to maintain intensity without signs/symptoms of physical distress.    Average METs 2      Resistance Training   Training Prescription Yes    Weight 2 lbs    Reps 10-15    Time 5 Minutes      Interval Training   Interval Training No      Recumbant Bike   Level 1    RPM 55    Watts 11    Minutes 15    METs 2.9      NuStep   Level 1    SPM 78    Minutes 15    METs 2.1           Nutrition:  Target Goals: Understanding of nutrition guidelines, daily intake of sodium 1500mg , cholesterol 200mg , calories 30% from fat and 7% or less from saturated fats, daily to have 5 or more servings of fruits and vegetables.  Biometrics:  Pre Biometrics - 10/16/24 0834       Pre Biometrics   Waist Circumference 45 inches    Hip Circumference 43 inches    Waist to Hip Ratio 1.05 %    Triceps Skinfold 15 mm    % Body Fat 31.2 %    Grip Strength 15 kg    Flexibility --   pt c/o back issues not performed   Single Leg Stand 1.2 seconds           Nutrition Therapy Plan and Nutrition Goals:  Nutrition Therapy & Goals - 10/22/24 9061  Nutrition Therapy   Diet Heart Healthy    Drug/Food Interactions Statins/Certain Fruits      Personal Nutrition Goals   Nutrition Goal Patient to identify strategies for reducing cardiovascular risk by attending the [employer]in education and nutrition series weekly.    Personal Goal #2 Patient to improve diet quality by using the plate method as a guide for meal planning to include lean protein/plant protein, fruits, vegetables, whole grains, nonfat dairy as part of a well-balanced diet.    Personal Goal #3 Patient to identify strategies for blood sugar control with goal A1c <7%.    Comments Patient with medical history significant for CAD with chronic stable angina, HTN, and DM2. Most recent A1c 5.9% on 08/15/24. Pt reports making recent dietary changes such as drinking primarly water. Identifies needs to make healthier food choices such as swapping out typical biscuit at breakfast for whole wheat toast. RD provided additional information regarding heart healthy diet. Discussed importance of increasing fiber intake to help lower risk factors for heart disease and diabetes. Patient will benefit from participation in intensive cardiac rehab for nutrition education, exercise, and lifestyle modification.      Intervention Plan   Intervention  Prescribe, educate and counsel regarding individualized specific dietary modifications aiming towards targeted core components such as weight, hypertension, lipid management, diabetes, heart failure and other comorbidities.;Nutrition handout(s) given to patient.   Handout: Pritikin Eating for a Healthy Heart   Expected Outcomes Short Term Goal: Understand basic principles of dietary content, such as calories, fat, sodium, cholesterol and nutrients.;Long Term Goal: Adherence to prescribed nutrition plan.          Nutrition Assessments:  MEDIFICTS Score Key: >=70 Need to make dietary changes  40-70 Heart Healthy Diet <= 40 Therapeutic Level Cholesterol Diet   Flowsheet Row INTENSIVE CARDIAC REHAB from 10/22/2024 in [facility]  Picture Your Plate Total Score on Admission 55   Picture Your Plate Scores: <59 Unhealthy dietary pattern with much room for improvement. 41-50 Dietary pattern unlikely to meet recommendations for good health and room for improvement. 51-60 More healthful dietary pattern, with some room for improvement.  >60 Healthy dietary pattern, although there may be some specific behaviors that could be improved.    Nutrition Goals Re-Evaluation:   Nutrition Goals Re-Evaluation:   Nutrition Goals Discharge (Final Nutrition Goals Re-Evaluation):   Psychosocial: Target Goals: Acknowledge presence or absence of significant depression and/or stress, maximize coping skills, provide positive support system. Participant is able to verbalize types and ability to use techniques and skills needed for reducing stress and depression.  Initial Review & Psychosocial Screening:  Initial Psych Review & Screening - 10/16/24 0857       Initial Review   Current issues with Current Sleep Concerns;Current Anxiety/Panic      Family Dynamics   Good Support System? Yes   Pt has sposue and daughter for support   Comments Pt does  voice low level anxiety in regards to his health issues.      Barriers   Psychosocial barriers to participate in program The patient should benefit from training in stress management and relaxation.      Screening Interventions   Interventions Encouraged to exercise    Expected Outcomes Short Term goal: Utilizing psychosocial counselor, staff and physician to assist with identification of specific Stressors or current issues interfering with healing process. Setting desired goal for each stressor or current issue identified.;Long Term Goal: Stressors or current  issues are controlled or eliminated.;Short Term goal: Identification and review with participant of any Quality of Life or Depression concerns found by scoring the questionnaire.;Long Term goal: The participant improves quality of Life and PHQ9 Scores as seen by post scores and/or verbalization of changes          Quality of Life Scores:  Quality of Life - 10/16/24 1349       Quality of Life   Select Quality of Life      Quality of Life Scores   Health/Function Pre 26.2 %    Socioeconomic Pre 26.5 %    Psych/Spiritual Pre 28.07 %    Family Pre 30 %    GLOBAL Pre 27.19 %         Scores of 19 and below usually indicate a poorer quality of life in these areas.  A difference of  2-3 points is a clinically meaningful difference.  A difference of 2-3 points in the total score of the Quality of Life Index has been associated with significant improvement in overall quality of life, self-image, physical symptoms, and general health in studies assessing change in quality of life.  PHQ-9: Review Flowsheet  More data exists      10/16/2024 09/27/2024 09/13/2024 08/23/2024 04/10/2024  Depression screen PHQ 2/9  Decreased Interest 1 0 0 0 0  Down, Depressed, Hopeless 0 0 0 0 0  PHQ - 2 Score 1 0 0 0 0  Altered sleeping 1 - - - -  Tired, decreased energy 1 - - - -  Change in appetite 1 - - - -  Feeling bad or failure about yourself   0 - - - -  Trouble concentrating 1 - - - -  Moving slowly or fidgety/restless 1 - - - -  Suicidal thoughts 0 - - - -  PHQ-9 Score 6 - - - -   Interpretation of Total Score  Total Score Depression Severity:  1-4 = Minimal depression, 5-9 = Mild depression, 10-14 = Moderate depression, 15-19 = Moderately severe depression, 20-27 = Severe depression   Psychosocial Evaluation and Intervention:   Psychosocial Re-Evaluation:  Psychosocial Re-Evaluation     Row Name 10/22/24 1637             Psychosocial Re-Evaluation   Current issues with Current Anxiety/Panic;Current Sleep Concerns       Comments Lyfe started cardiac rehab on 10/22/24. Reviewed PHQ9. Eliyas says he has had some improvement in his energy level since his Ranexa  has been discontinued. Kendrik says he has to get up frequently during the night and therefore affects his sleep. Jeoffrey says he has an appointment with the urlogist and will discuss in the near future.       Expected Outcomes Kyaire will have controlled or decreased anxitey stressors upon completion of cardiac rehab.       Interventions Encouraged to attend Cardiac Rehabilitation for the exercise;Encouraged to attend Pulmonary Rehabilitation for the exercise;Stress management education       Continue Psychosocial Services  No Follow up required          Psychosocial Discharge (Final Psychosocial Re-Evaluation):  Psychosocial Re-Evaluation - 10/22/24 1637       Psychosocial Re-Evaluation   Current issues with Current Anxiety/Panic;Current Sleep Concerns    Comments Jerett started cardiac rehab on 10/22/24. Reviewed PHQ9. Cashawn says he has had some improvement in his energy level since his Ranexa  has been discontinued. Jamail says he has to get up frequently during the night  and therefore affects his sleep. Jeoffrey says he has an appointment with the urlogist and will discuss in the near future.    Expected Outcomes Kein will have controlled or  decreased anxitey stressors upon completion of cardiac rehab.    Interventions Encouraged to attend Cardiac Rehabilitation for the exercise;Encouraged to attend Pulmonary Rehabilitation for the exercise;Stress management education    Continue Psychosocial Services  No Follow up required          Vocational Rehabilitation: Provide vocational rehab assistance to qualifying candidates.   Vocational Rehab Evaluation & Intervention:  Vocational Rehab - 10/16/24 0856       Initial Vocational Rehab Evaluation & Intervention   Assessment shows need for Vocational Rehabilitation No          Education: Education Goals: Education classes will be provided on a weekly basis, covering required topics. Participant will state understanding/return demonstration of topics presented.    Education     Row Name 10/22/24 0800     Education   Cardiac Education Topics Pritikin   Glass Blower/designer Nutrition   Nutrition Workshop Targeting Your Nutrition Priorities   Instruction Review Code 1- Verbalizes Understanding   Class Start Time 0815   Class Stop Time 0901   Class Time Calculation (min) 46 min    Row Name 10/24/24 0800     Education   Cardiac Education Topics [facility]ritikin   Secondary School Teacher School   Educator Dietitian   Weekly Topic One-Pot Wonders   Instruction Review Code 1- Verbalizes Understanding   Class Start Time [id number]   Class Stop Time 0858   Class Time Calculation (min) 44 min    Row Name 10/29/24 0800     Education   Cardiac Education Topics Pritikin   [employer] Workshops     Workshops   Educator Exercise Physiologist   Select Psychosocial   Psychosocial Workshop Focused Goals, Sustainable Changes   Instruction Review Code 1- Verbalizes Understanding   Class Start Time 0815   Class Stop Time 0850   Class Time Calculation (min) 35 min      Core Videos: Exercise    Move It!  Clinical staff conducted  group or individual video education with verbal and written material and guidebook.  Patient learns the recommended Pritikin exercise program. Exercise with the goal of living a long, healthy life. Some of the health benefits of exercise include controlled diabetes, healthier blood pressure levels, improved cholesterol levels, improved heart and lung capacity, improved sleep, and better body composition. Everyone should speak with their doctor before starting or changing an exercise routine.  Biomechanical Limitations Clinical staff conducted group or individual video education with verbal and written material and guidebook.  Patient learns how biomechanical limitations can impact exercise and how we can mitigate and possibly overcome limitations to have an impactful and balanced exercise routine.  Body Composition Clinical staff conducted group or individual video education with verbal and written material and guidebook.  Patient learns that body composition (ratio of muscle mass to fat mass) is a key component to assessing overall fitness, rather than body weight alone. Increased fat mass, especially visceral belly fat, can put us  at increased risk for metabolic syndrome, type 2 diabetes, heart disease, and even death. It is recommended to combine diet and exercise (cardiovascular and resistance training) to improve your body composition. Seek guidance from your physician and exercise physiologist before implementing an  exercise routine.  Exercise Action Plan Clinical staff conducted group or individual video education with verbal and written material and guidebook.  Patient learns the recommended strategies to achieve and enjoy long-term exercise adherence, including variety, self-motivation, self-efficacy, and positive decision making. Benefits of exercise include fitness, good health, weight management, more energy, better sleep, less stress, and overall well-being.  Medical   Heart Disease Risk  Reduction Clinical staff conducted group or individual video education with verbal and written material and guidebook.  Patient learns our heart is our most vital organ as it circulates oxygen, nutrients, white blood cells, and hormones throughout the entire body, and carries waste away. Data supports a plant-based eating plan like the Pritikin Program for its effectiveness in slowing progression of and reversing heart disease. The video provides a number of recommendations to address heart disease.   Metabolic Syndrome and Belly Fat  Clinical staff conducted group or individual video education with verbal and written material and guidebook.  Patient learns what metabolic syndrome is, how it leads to heart disease, and how one can reverse it and keep it from coming back. You have metabolic syndrome if you have 3 of the following 5 criteria: abdominal obesity, high blood pressure, high triglycerides, low HDL cholesterol, and high blood sugar.  Hypertension and Heart Disease Clinical staff conducted group or individual video education with verbal and written material and guidebook.  Patient learns that high blood pressure, or hypertension, is very common in the [place] . Hypertension is largely due to excessive salt intake, but other important risk factors include being overweight, physical inactivity, drinking too much alcohol, smoking, and not eating enough potassium from fruits and vegetables. High blood pressure is a leading risk factor for heart attack, stroke, congestive heart failure, dementia, kidney failure, and premature death. Long-term effects of excessive salt intake include stiffening of the arteries and thickening of heart muscle and organ damage. Recommendations include ways to reduce hypertension and the risk of heart disease.  Diseases of Our Time - Focusing on Diabetes Clinical staff conducted group or individual video education with verbal and written material and guidebook.   Patient learns why the best way to stop diseases of our time is prevention, through food and other lifestyle changes. Medicine (such as prescription pills and surgeries) is often only a Band-Aid on the problem, not a long-term solution. Most common diseases of our time include obesity, type 2 diabetes, hypertension, heart disease, and cancer. The Pritikin Program is recommended and has been proven to help reduce, reverse, and/or prevent the damaging effects of metabolic syndrome.  Nutrition   Overview of the Pritikin Eating Plan  Clinical staff conducted group or individual video education with verbal and written material and guidebook.  Patient learns about the Pritikin Eating Plan for disease risk reduction. The Pritikin Eating Plan emphasizes a wide variety of unrefined, minimally-processed carbohydrates, like fruits, vegetables, whole grains, and legumes. Go, Caution, and Stop food choices are explained. Plant-based and lean animal proteins are emphasized. Rationale provided for low sodium intake for blood pressure control, low added sugars for blood sugar stabilization, and low added fats and oils for coronary artery disease risk reduction and weight management.  Calorie Density  Clinical staff conducted group or individual video education with verbal and written material and guidebook.  Patient learns about calorie density and how it impacts the Pritikin Eating Plan. Knowing the characteristics of the food you choose will help you decide whether those foods will lead to weight gain or weight  loss, and whether you want to consume more or less of them. Weight loss is usually a side effect of the Pritikin Eating Plan because of its focus on low calorie-dense foods.  Label Reading  Clinical staff conducted group or individual video education with verbal and written material and guidebook.  Patient learns about the Pritikin recommended label reading guidelines and corresponding recommendations  regarding calorie density, added sugars, sodium content, and whole grains.  Dining Out - Part 1  Clinical staff conducted group or individual video education with verbal and written material and guidebook.  Patient learns that restaurant meals can be sabotaging because they can be so high in calories, fat, sodium, and/or sugar. Patient learns recommended strategies on how to positively address this and avoid unhealthy pitfalls.  Facts on Fats  Clinical staff conducted group or individual video education with verbal and written material and guidebook.  Patient learns that lifestyle modifications can be just as effective, if not more so, as many medications for lowering your risk of heart disease. A Pritikin lifestyle can help to reduce your risk of inflammation and atherosclerosis (cholesterol build-up, or plaque, in the artery walls). Lifestyle interventions such as dietary choices and physical activity address the cause of atherosclerosis. A review of the types of fats and their impact on blood cholesterol levels, along with dietary recommendations to reduce fat intake is also included.  Nutrition Action Plan  Clinical staff conducted group or individual video education with verbal and written material and guidebook.  Patient learns how to incorporate Pritikin recommendations into their lifestyle. Recommendations include planning and keeping personal health goals in mind as an important part of their success.  Healthy Mind-Set    Healthy Minds, Bodies, Hearts  Clinical staff conducted group or individual video education with verbal and written material and guidebook.  Patient learns how to identify when they are stressed. Video will discuss the impact of that stress, as well as the many benefits of stress management. Patient will also be introduced to stress management techniques. The way we think, act, and feel has an impact on our hearts.  How Our Thoughts Can Heal Our Hearts  Clinical staff  conducted group or individual video education with verbal and written material and guidebook.  Patient learns that negative thoughts can cause depression and anxiety. This can result in negative lifestyle behavior and serious health problems. Cognitive behavioral therapy is an effective method to help control our thoughts in order to change and improve our emotional outlook.  Additional Videos:  Exercise    Improving Performance  Clinical staff conducted group or individual video education with verbal and written material and guidebook.  Patient learns to use a non-linear approach by alternating intensity levels and lengths of time spent exercising to help burn more calories and lose more body fat. Cardiovascular exercise helps improve heart health, metabolism, hormonal balance, blood sugar control, and recovery from fatigue. Resistance training improves strength, endurance, balance, coordination, reaction time, metabolism, and muscle mass. Flexibility exercise improves circulation, posture, and balance. Seek guidance from your physician and exercise physiologist before implementing an exercise routine and learn your capabilities and proper form for all exercise.  Introduction to Yoga  Clinical staff conducted group or individual video education with verbal and written material and guidebook.  Patient learns about yoga, a discipline of the coming together of mind, breath, and body. The benefits of yoga include improved flexibility, improved range of motion, better posture and core strength, increased lung function, weight loss, and positive  self-image. Yoga's heart health benefits include lowered blood pressure, healthier heart rate, decreased cholesterol and triglyceride levels, improved immune function, and reduced stress. Seek guidance from your physician and exercise physiologist before implementing an exercise routine and learn your capabilities and proper form for all exercise.  Medical   Aging:  Enhancing Your Quality of Life  Clinical staff conducted group or individual video education with verbal and written material and guidebook.  Patient learns key strategies and recommendations to stay in good physical health and enhance quality of life, such as prevention strategies, having an advocate, securing a Health Care Proxy and Power of Attorney, and keeping a list of medications and system for tracking them. It also discusses how to avoid risk for bone loss.  Biology of Weight Control  Clinical staff conducted group or individual video education with verbal and written material and guidebook.  Patient learns that weight gain occurs because we consume more calories than we burn (eating more, moving less). Even if your body weight is normal, you may have higher ratios of fat compared to muscle mass. Too much body fat puts you at increased risk for cardiovascular disease, heart attack, stroke, type 2 diabetes, and obesity-related cancers. In addition to exercise, following the Pritikin Eating Plan can help reduce your risk.  Decoding Lab Results  Clinical staff conducted group or individual video education with verbal and written material and guidebook.  Patient learns that lab test reflects one measurement whose values change over time and are influenced by many factors, including medication, stress, sleep, exercise, food, hydration, pre-existing medical conditions, and more. It is recommended to use the knowledge from this video to become more involved with your lab results and evaluate your numbers to speak with your doctor.   Diseases of Our Time - Overview  Clinical staff conducted group or individual video education with verbal and written material and guidebook.  Patient learns that according to the CDC, 50% to 70% of chronic diseases (such as obesity, type 2 diabetes, elevated lipids, hypertension, and heart disease) are avoidable through lifestyle improvements including healthier food  choices, listening to satiety cues, and increased physical activity.  Sleep Disorders Clinical staff conducted group or individual video education with verbal and written material and guidebook.  Patient learns how good quality and duration of sleep are important to overall health and well-being. Patient also learns about sleep disorders and how they impact health along with recommendations to address them, including discussing with a physician.  Nutrition  Dining Out - Part 2 Clinical staff conducted group or individual video education with verbal and written material and guidebook.  Patient learns how to plan ahead and communicate in order to maximize their dining experience in a healthy and nutritious manner. Included are recommended food choices based on the type of restaurant the patient is visiting.   Fueling a Banker conducted group or individual video education with verbal and written material and guidebook.  There is a strong connection between our food choices and our health. Diseases like obesity and type 2 diabetes are very prevalent and are in large-part due to lifestyle choices. The Pritikin Eating Plan provides plenty of food and hunger-curbing satisfaction. It is easy to follow, affordable, and helps reduce health risks.  Menu Workshop  Clinical staff conducted group or individual video education with verbal and written material and guidebook.  Patient learns that restaurant meals can sabotage health goals because they are often packed with calories, fat, sodium, and sugar.  Recommendations include strategies to plan ahead and to communicate with the manager, chef, or server to help order a healthier meal.  Planning Your Eating Strategy  Clinical staff conducted group or individual video education with verbal and written material and guidebook.  Patient learns about the Pritikin Eating Plan and its benefit of reducing the risk of disease. The Pritikin Eating  Plan does not focus on calories. Instead, it emphasizes high-quality, nutrient-rich foods. By knowing the characteristics of the foods, we choose, we can determine their calorie density and make informed decisions.  Targeting Your Nutrition Priorities  Clinical staff conducted group or individual video education with verbal and written material and guidebook.  Patient learns that lifestyle habits have a tremendous impact on disease risk and progression. This video provides eating and physical activity recommendations based on your personal health goals, such as reducing LDL cholesterol, losing weight, preventing or controlling type 2 diabetes, and reducing high blood pressure.  Vitamins and Minerals  Clinical staff conducted group or individual video education with verbal and written material and guidebook.  Patient learns different ways to obtain key vitamins and minerals, including through a recommended healthy diet. It is important to discuss all supplements you take with your doctor.   Healthy Mind-Set    Smoking Cessation  Clinical staff conducted group or individual video education with verbal and written material and guidebook.  Patient learns that cigarette smoking and tobacco addiction pose a serious health risk which affects millions of people. Stopping smoking will significantly reduce the risk of heart disease, lung disease, and many forms of cancer. Recommended strategies for quitting are covered, including working with your doctor to develop a successful plan.  Culinary   Becoming a Set Designer conducted group or individual video education with verbal and written material and guidebook.  Patient learns that cooking at home can be healthy, cost-effective, quick, and puts them in control. Keys to cooking healthy recipes will include looking at your recipe, assessing your equipment needs, planning ahead, making it simple, choosing cost-effective seasonal ingredients,  and limiting the use of added fats, salts, and sugars.  Cooking - Breakfast and Snacks  Clinical staff conducted group or individual video education with verbal and written material and guidebook.  Patient learns how important breakfast is to satiety and nutrition through the entire day. Recommendations include key foods to eat during breakfast to help stabilize blood sugar levels and to prevent overeating at meals later in the day. Planning ahead is also a key component.  Cooking - Educational Psychologist conducted group or individual video education with verbal and written material and guidebook.  Patient learns eating strategies to improve overall health, including an approach to cook more at home. Recommendations include thinking of animal protein as a side on your plate rather than center stage and focusing instead on lower calorie dense options like vegetables, fruits, whole grains, and plant-based proteins, such as beans. Making sauces in large quantities to freeze for later and leaving the skin on your vegetables are also recommended to maximize your experience.  Cooking - Healthy Salads and Dressing Clinical staff conducted group or individual video education with verbal and written material and guidebook.  Patient learns that vegetables, fruits, whole grains, and legumes are the foundations of the Pritikin Eating Plan. Recommendations include how to incorporate each of these in flavorful and healthy salads, and how to create homemade salad dressings. Proper handling of ingredients is also covered. Cooking - Soups and  Desserts  Cooking - Soups and Desserts Clinical staff conducted group or individual video education with verbal and written material and guidebook.  Patient learns that Pritikin soups and desserts make for easy, nutritious, and delicious snacks and meal components that are low in sodium, fat, sugar, and calorie density, while high in vitamins, minerals, and filling  fiber. Recommendations include simple and healthy ideas for soups and desserts.   Overview     The Pritikin Solution Program Overview Clinical staff conducted group or individual video education with verbal and written material and guidebook.  Patient learns that the results of the Pritikin Program have been documented in more than 100 articles published in peer-reviewed journals, and the benefits include reducing risk factors for (and, in some cases, even reversing) high cholesterol, high blood pressure, type 2 diabetes, obesity, and more! An overview of the three key pillars of the Pritikin Program will be covered: eating well, doing regular exercise, and having a healthy mind-set.  WORKSHOPS  Exercise: Exercise Basics: Building Your Action Plan Clinical staff led group instruction and group discussion with PowerPoint presentation and patient guidebook. To enhance the learning environment the use of posters, models and videos may be added. At the conclusion of this workshop, patients will comprehend the difference between physical activity and exercise, as well as the benefits of incorporating both, into their routine. Patients will understand the FITT (Frequency, Intensity, Time, and Type) principle and how to use it to build an exercise action plan. In addition, safety concerns and other considerations for exercise and cardiac rehab will be addressed by the presenter. The purpose of this lesson is to promote a comprehensive and effective weekly exercise routine in order to improve patients' overall level of fitness.   Managing Heart Disease: Your Path to a Healthier Heart Clinical staff led group instruction and group discussion with PowerPoint presentation and patient guidebook. To enhance the learning environment the use of posters, models and videos may be added.At the conclusion of this workshop, patients will understand the anatomy and physiology of the heart. Additionally, they will  understand how Pritikin's three pillars impact the risk factors, the progression, and the management of heart disease.  The purpose of this lesson is to provide a high-level overview of the heart, heart disease, and how the Pritikin lifestyle positively impacts risk factors.  Exercise Biomechanics Clinical staff led group instruction and group discussion with PowerPoint presentation and patient guidebook. To enhance the learning environment the use of posters, models and videos may be added. Patients will learn how the structural parts of their bodies function and how these functions impact their daily activities, movement, and exercise. Patients will learn how to promote a neutral spine, learn how to manage pain, and identify ways to improve their physical movement in order to promote healthy living. The purpose of this lesson is to expose patients to common physical limitations that impact physical activity. Participants will learn practical ways to adapt and manage aches and pains, and to minimize their effect on regular exercise. Patients will learn how to maintain good posture while sitting, walking, and lifting.  Balance Training and Fall Prevention  Clinical staff led group instruction and group discussion with PowerPoint presentation and patient guidebook. To enhance the learning environment the use of posters, models and videos may be added. At the conclusion of this workshop, patients will understand the importance of their sensorimotor skills (vision, proprioception, and the vestibular system) in maintaining their ability to balance as they age. Patients will apply  a variety of balancing exercises that are appropriate for their current level of function. Patients will understand the common causes for poor balance, possible solutions to these problems, and ways to modify their physical environment in order to minimize their fall risk. The purpose of this lesson is to teach patients  about the importance of maintaining balance as they age and ways to minimize their risk of falling.  WORKSHOPS   Nutrition:  Fueling a Ship Broker led group instruction and group discussion with PowerPoint presentation and patient guidebook. To enhance the learning environment the use of posters, models and videos may be added. Patients will review the foundational principles of the Pritikin Eating Plan and understand what constitutes a serving size in each of the food groups. Patients will also learn Pritikin-friendly foods that are better choices when away from home and review make-ahead meal and snack options. Calorie density will be reviewed and applied to three nutrition priorities: weight maintenance, weight loss, and weight gain. The purpose of this lesson is to reinforce (in a group setting) the key concepts around what patients are recommended to eat and how to apply these guidelines when away from home by planning and selecting Pritikin-friendly options. Patients will understand how calorie density may be adjusted for different weight management goals.  Mindful Eating  Clinical staff led group instruction and group discussion with PowerPoint presentation and patient guidebook. To enhance the learning environment the use of posters, models and videos may be added. Patients will briefly review the concepts of the Pritikin Eating Plan and the importance of low-calorie dense foods. The concept of mindful eating will be introduced as well as the importance of paying attention to internal hunger signals. Triggers for non-hunger eating and techniques for dealing with triggers will be explored. The purpose of this lesson is to provide patients with the opportunity to review the basic principles of the Pritikin Eating Plan, discuss the value of eating mindfully and how to measure internal cues of hunger and fullness using the Hunger Scale. Patients will also discuss reasons for non-hunger  eating and learn strategies to use for controlling emotional eating.  Targeting Your Nutrition Priorities Clinical staff led group instruction and group discussion with PowerPoint presentation and patient guidebook. To enhance the learning environment the use of posters, models and videos may be added. Patients will learn how to determine their genetic susceptibility to disease by reviewing their family history. Patients will gain insight into the importance of diet as part of an overall healthy lifestyle in mitigating the impact of genetics and other environmental insults. The purpose of this lesson is to provide patients with the opportunity to assess their personal nutrition priorities by looking at their family history, their own health history and current risk factors. Patients will also be able to discuss ways of prioritizing and modifying the Pritikin Eating Plan for their highest risk areas  Menu  Clinical staff led group instruction and group discussion with PowerPoint presentation and patient guidebook. To enhance the learning environment the use of posters, models and videos may be added. Using menus brought in from [employer], or printed from [employer], patients will apply the Pritikin dining out guidelines that were presented in the [employer] video. Patients will also be able to practice these guidelines in a variety of provided scenarios. The purpose of this lesson is to provide patients with the opportunity to practice hands-on learning of the Pritikin Dining Out guidelines with actual menus and practice  scenarios.  Label Reading Clinical staff led group instruction and group discussion with PowerPoint presentation and patient guidebook. To enhance the learning environment the use of posters, models and videos may be added. Patients will review and discuss the Pritikin label reading guidelines presented in Pritikin's Label Reading Educational series video.  Using fool labels brought in from local grocery stores and markets, patients will apply the label reading guidelines and determine if the packaged food meet the Pritikin guidelines. The purpose of this lesson is to provide patients with the opportunity to review, discuss, and practice hands-on learning of the Pritikin Label Reading guidelines with actual packaged food labels. Cooking School  Pritikin's [employer] are designed to teach patients ways to prepare quick, simple, and affordable recipes at home. The importance of nutrition's role in chronic disease risk reduction is reflected in its emphasis in the overall Pritikin program. By learning how to prepare essential core Pritikin Eating Plan recipes, patients will increase control over what they eat; be able to customize the flavor of foods without the use of added salt, sugar, or fat; and improve the quality of the food they consume. By learning a set of core recipes which are easily assembled, quickly prepared, and affordable, patients are more likely to prepare more healthy foods at home. These workshops focus on convenient breakfasts, simple entres, side dishes, and desserts which can be prepared with minimal effort and are consistent with nutrition recommendations for cardiovascular risk reduction. Cooking [employer] are taught by a armed forces logistics/support/administrative officer (RD) who has been trained by the [employer]. The chef or RD has a clear understanding of the importance of minimizing - if not completely eliminating - added fat, sugar, and sodium in recipes. Throughout the series of Cooking School Workshop sessions, patients will learn about healthy ingredients and efficient methods of cooking to build confidence in their capability to prepare    Cooking School weekly topics:  Adding Flavor- Sodium-Free  Fast and Healthy Breakfasts  Powerhouse Plant-Based Proteins  Satisfying Salads and Dressings  Simple Sides and  Sauces  International Cuisine-Spotlight on the [employer] Zones  Delicious Desserts  Savory Soups  [employer] - Meals in a Astronomer Appetizers and Snacks  Comforting Weekend Breakfasts  One-Pot Wonders   Fast Evening Meals  Landscape Architect Your Pritikin Plate  WORKSHOPS   Healthy Mindset (Psychosocial):  Focused Goals, Sustainable Changes Clinical staff led group instruction and group discussion with PowerPoint presentation and patient guidebook. To enhance the learning environment the use of posters, models and videos may be added. Patients will be able to apply effective goal setting strategies to establish at least one personal goal, and then take consistent, meaningful action toward that goal. They will learn to identify common barriers to achieving personal goals and develop strategies to overcome them. Patients will also gain an understanding of how our mind-set can impact our ability to achieve goals and the importance of cultivating a positive and growth-oriented mind-set. The purpose of this lesson is to provide patients with a deeper understanding of how to set and achieve personal goals, as well as the tools and strategies needed to overcome common obstacles which may arise along the way.  From Head to Heart: The Power of a Healthy Outlook  Clinical staff led group instruction and group discussion with PowerPoint presentation and patient guidebook. To enhance the learning environment the use of posters, models and videos may be added. Patients will be able to  recognize and describe the impact of emotions and mood on physical health. They will discover the importance of self-care and explore self-care practices which may work for them. Patients will also learn how to utilize the 4 C's to cultivate a healthier outlook and better manage stress and challenges. The purpose of this lesson is to demonstrate to patients how a healthy outlook is an essential part of  maintaining good health, especially as they continue their cardiac rehab journey.  Healthy Sleep for a Healthy Heart Clinical staff led group instruction and group discussion with PowerPoint presentation and patient guidebook. To enhance the learning environment the use of posters, models and videos may be added. At the conclusion of this workshop, patients will be able to demonstrate knowledge of the importance of sleep to overall health, well-being, and quality of life. They will understand the symptoms of, and treatments for, common sleep disorders. Patients will also be able to identify daytime and nighttime behaviors which impact sleep, and they will be able to apply these tools to help manage sleep-related challenges. The purpose of this lesson is to provide patients with a general overview of sleep and outline the importance of quality sleep. Patients will learn about a few of the most common sleep disorders. Patients will also be introduced to the concept of "sleep hygiene," and discover ways to self-manage certain sleeping problems through simple daily behavior changes. Finally, the workshop will motivate patients by clarifying the links between quality sleep and their goals of heart-healthy living.   Recognizing and Reducing Stress Clinical staff led group instruction and group discussion with PowerPoint presentation and patient guidebook. To enhance the learning environment the use of posters, models and videos may be added. At the conclusion of this workshop, patients will be able to understand the types of stress reactions, differentiate between acute and chronic stress, and recognize the impact that chronic stress has on their health. They will also be able to apply different coping mechanisms, such as reframing negative self-talk. Patients will have the opportunity to practice a variety of stress management techniques, such as deep abdominal breathing, progressive muscle relaxation, and/or  guided imagery.  The purpose of this lesson is to educate patients on the role of stress in their lives and to provide healthy techniques for coping with it.  Learning Barriers/Preferences:  Learning Barriers/Preferences - 10/16/24 1350       Learning Barriers/Preferences   Learning Barriers Sight    Learning Preferences Audio;Computer/Internet;Group Instruction;Individual Instruction;Pictoral;Skilled Demonstration;Verbal Instruction;Video;Written Material          Education Topics:  Knowledge Questionnaire Score:  Knowledge Questionnaire Score - 10/16/24 1351       Knowledge Questionnaire Score   Pre Score 18/24          Core Components/Risk Factors/Patient Goals at Admission:  Personal Goals and Risk Factors at Admission - 10/16/24 0856       Core Components/Risk Factors/Patient Goals on Admission   Diabetes Yes    Intervention Provide education about signs/symptoms and action to take for hypo/hyperglycemia.;Provide education about proper nutrition, including hydration, and aerobic/resistive exercise prescription along with prescribed medications to achieve blood glucose in normal ranges: Fasting glucose 65-99 mg/dL    Expected Outcomes Short Term: Participant verbalizes understanding of the signs/symptoms and immediate care of hyper/hypoglycemia, proper foot care and importance of medication, aerobic/resistive exercise and nutrition plan for blood glucose control.;Long Term: Attainment of HbA1C < 7%.    Hypertension Yes    Intervention Provide education on lifestyle modifcations including  regular physical activity/exercise, weight management, moderate sodium restriction and increased consumption of fresh fruit, vegetables, and low fat dairy, alcohol moderation, and smoking cessation.;Monitor prescription use compliance.    Expected Outcomes Long Term: Maintenance of blood pressure at goal levels.;Short Term: Continued assessment and intervention until BP is < 140/69mm HG in  hypertensive participants. < 130/51mm HG in hypertensive participants with diabetes, heart failure or chronic kidney disease.    Lipids Yes    Intervention Provide education and support for participant on nutrition & aerobic/resistive exercise along with prescribed medications to achieve LDL 70mg , HDL >40mg .    Expected Outcomes Short Term: Participant states understanding of desired cholesterol values and is compliant with medications prescribed. Participant is following exercise prescription and nutrition guidelines.;Long Term: Cholesterol controlled with medications as prescribed, with individualized exercise RX and with personalized nutrition plan. Value goals: LDL < 70mg , HDL > 40 mg.          Core Components/Risk Factors/Patient Goals Review:   Goals and Risk Factor Review     Row Name 10/22/24 1643 10/29/24 1707           Core Components/Risk Factors/Patient Goals Review   Personal Goals Review Weight Management/Obesity;Hypertension;Lipids;Diabetes Weight Management/Obesity;Hypertension;Lipids;Diabetes      Review Norrin started cardiac rehab on 10/22/24. Jery did well with exercise for his fitness level. vital signs and CBg's were WNL. Yuepheng is somewhat deconditioned but said that he felt better after exercising. Hakop started cardiac rehab on 10/22/24. Dorsey is doing well with exercise for his fitness level. vital signs and CBg's have been within normal limits.      Expected Outcomes Lowell will continue to participate in cardiac rehab for exercise, nutrition and lifestyle modifications. Edris will continue to participate in cardiac rehab for exercise, nutrition and lifestyle modifications.         Core Components/Risk Factors/Patient Goals at Discharge (Final Review):   Goals and Risk Factor Review - 10/29/24 1707       Core Components/Risk Factors/Patient Goals Review   Personal Goals Review Weight Management/Obesity;Hypertension;Lipids;Diabetes    Review  Avery started cardiac rehab on 10/22/24. Ozell is doing well with exercise for his fitness level. vital signs and CBg's have been within normal limits.    Expected Outcomes Almalik will continue to participate in cardiac rehab for exercise, nutrition and lifestyle modifications.          ITP Comments:  ITP Comments     Row Name 10/16/24 9164 10/22/24 1632 10/29/24 1705       ITP Comments Wilbert Bihari, MD: Medical Director. Introduction to the Pritikin Education program / Intensive Cardiac Rehab.  Initial orientation packet reviewed with the patient. 30 Day ITP Review. Atanacio started cardiac rehab on 10/22/24. Salmaan did 30 Day ITP Review. Gio started cardiac rehab on 10/22/24. Hovanes is off to a good start to exercise for his fitness level.        Comments: See ITP comments.Hadassah Elpidio Quan RN BSN

## 2024-10-30 ENCOUNTER — Encounter: Payer: Self-pay | Admitting: Pharmacist

## 2024-10-30 NOTE — Progress Notes (Signed)
 Pharmacy Quality Measure Review  This patient is appearing on a report for being at risk of failing the adherence measure for hypertension (ACEi/ARB) medications this calendar year.   Medication: losartan  50mg  Last fill date: 07/05/2024 for 90 day supply  Patient presented to the ED on 08/13/2024 with confusion and dizziness. This had been going on for about a month. He was admitted for further workup. Underwent echocardiogram which showed normal EF. Amlodipine  and losartan  were discontinued. Flomax  was discontinued. He was started on alfuzosin  and finasteride . His symptoms improved and he was discharged home.   Medication had been discontinued. No action needed at this time.   Madelin Ray, PharmD Clinical Pharmacist [facility]  Population Health [phone]

## 2024-10-31 ENCOUNTER — Encounter (HOSPITAL_COMMUNITY)
Admission: RE | Admit: 2024-10-31 | Discharge: 2024-10-31 | Disposition: A | Source: Ambulatory Visit | Attending: Cardiology

## 2024-10-31 ENCOUNTER — Telehealth (HOSPITAL_COMMUNITY): Payer: Self-pay | Admitting: *Deleted

## 2024-10-31 DIAGNOSIS — I2089 Other forms of angina pectoris: Secondary | ICD-10-CM | POA: Diagnosis not present

## 2024-10-31 NOTE — Progress Notes (Signed)
 Post exercise blood pressure 88/58 heart rate 72. Patient asymptomatic. Given water. Recheck BP 89/53. Patient given additional water. Medications review. Recheck sitting BP 114/64. Standing blood pressure 128/68. Medications reviewed. Taking as prescribed. Will consult with onsite provider Vincent Braver NP. Onsite provider Vincent Braver NP notified. Instructed the patient his wife is present to make sure that he is hydrating adequately throughout the day.  Will notify Dr Posey office to see if patient's medications need to be adjusted and continue to monitor the patient. CBG this morning was 159. Vincent Stevens says that he ate breakfast and drank about one and 1/2 cups of water today.Hadassah Elpidio Quan RN BSN

## 2024-10-31 NOTE — Telephone Encounter (Signed)
-----   Message from Jennifer SAUNDERS Revankar sent at 10/31/2024 12:59 PM EST ----- Please have him come to rehab well-hydrated and let him add a little extra salt and water twice routine diet and let me know how he does.  Thank you ----- Message ----- From: Cyrus Hadassah ORN, RN Sent: 10/31/2024  10:57 AM EST To: Donovan SAUNDERS Lesches, CMA; Jennifer SAUNDERS Crape, MD  Good morning  Dr Crape, Mr Jarboe was hypotensive today at cardiac rehab today please review attached media. Mr Cush was asymptomatic.  Please let us  know if the patient needs any adjustments to his current medications.  Thanks for your input!  Sincerely, Hadassah

## 2024-11-02 ENCOUNTER — Encounter (HOSPITAL_COMMUNITY)
Admission: RE | Admit: 2024-11-02 | Discharge: 2024-11-02 | Disposition: A | Source: Ambulatory Visit | Attending: Cardiology

## 2024-11-02 DIAGNOSIS — I2089 Other forms of angina pectoris: Secondary | ICD-10-CM

## 2024-11-05 ENCOUNTER — Encounter (HOSPITAL_COMMUNITY)
Admission: RE | Admit: 2024-11-05 | Discharge: 2024-11-05 | Disposition: A | Source: Ambulatory Visit | Attending: Cardiology

## 2024-11-05 DIAGNOSIS — I2089 Other forms of angina pectoris: Secondary | ICD-10-CM

## 2024-11-07 ENCOUNTER — Encounter (HOSPITAL_COMMUNITY)
Admission: RE | Admit: 2024-11-07 | Discharge: 2024-11-07 | Disposition: A | Discharge status: Home / Self Care | Source: Ambulatory Visit | Attending: Cardiology | Admitting: Cardiology

## 2024-11-07 ENCOUNTER — Other Ambulatory Visit: Payer: Self-pay | Admitting: Family Medicine

## 2024-11-07 DIAGNOSIS — I2089 Other forms of angina pectoris: Secondary | ICD-10-CM

## 2024-11-09 ENCOUNTER — Encounter (HOSPITAL_COMMUNITY)
Admission: RE | Admit: 2024-11-09 | Discharge: 2024-11-09 | Disposition: A | Source: Ambulatory Visit | Attending: Cardiology

## 2024-11-09 DIAGNOSIS — I2089 Other forms of angina pectoris: Secondary | ICD-10-CM

## 2024-11-12 ENCOUNTER — Encounter (HOSPITAL_COMMUNITY)
Admission: RE | Admit: 2024-11-12 | Discharge: 2024-11-12 | Disposition: A | Source: Ambulatory Visit | Attending: Cardiology

## 2024-11-12 DIAGNOSIS — I2089 Other forms of angina pectoris: Secondary | ICD-10-CM | POA: Diagnosis not present

## 2024-11-14 ENCOUNTER — Encounter (HOSPITAL_COMMUNITY): Admission: RE | Admit: 2024-11-14 | Discharge: 2024-11-14 | Attending: Cardiology

## 2024-11-14 DIAGNOSIS — I2089 Other forms of angina pectoris: Secondary | ICD-10-CM

## 2024-11-16 ENCOUNTER — Encounter (HOSPITAL_COMMUNITY): Admission: RE | Admit: 2024-11-16 | Discharge: 2024-11-16 | Attending: Cardiology

## 2024-11-16 DIAGNOSIS — I2089 Other forms of angina pectoris: Secondary | ICD-10-CM

## 2024-11-19 ENCOUNTER — Encounter (HOSPITAL_COMMUNITY): Admission: RE | Admit: 2024-11-19 | Discharge: 2024-11-19 | Attending: Cardiology

## 2024-11-19 DIAGNOSIS — I2089 Other forms of angina pectoris: Secondary | ICD-10-CM | POA: Diagnosis not present

## 2024-11-21 ENCOUNTER — Encounter (HOSPITAL_COMMUNITY)
Admission: RE | Admit: 2024-11-21 | Discharge: 2024-11-21 | Disposition: A | Source: Ambulatory Visit | Attending: Cardiology

## 2024-11-21 DIAGNOSIS — I2089 Other forms of angina pectoris: Secondary | ICD-10-CM

## 2024-11-21 NOTE — Progress Notes (Signed)
Reviewed home exercise Rx with patient today.  Encouraged warm-up, cool-down, and stretching. Reviewed THRR of  56-113 and keeping RPE between 11-13. Encouraged to hydrate with activity.  Reviewed weather parameters for temperature and humidity for safe exercise outdoors. Reviewed S/S to terminate exercise and when to call 911 vs MD. Reviewed the use of NTG and pt was encouraged to carry at all times. Pt encouraged to always carry a cell phone for safety when exercising outdoors. Pt verbalized understanding of the home exercise Rx and was provided a copy.  Lesly Rubenstein MS, ACSM-CEP, CCRP

## 2024-11-23 ENCOUNTER — Encounter (HOSPITAL_COMMUNITY)

## 2024-11-23 ENCOUNTER — Other Ambulatory Visit: Payer: Self-pay | Admitting: Family Medicine

## 2024-11-23 NOTE — Telephone Encounter (Signed)
 Last OV: 09/27/2024  Next OV: 03/15/2024  Last Refill: 08/23/2024  Dispense: 20/0

## 2024-11-23 NOTE — Telephone Encounter (Signed)
 Copied from CRM #[id number]. Topic: Clinical - Medication Refill >> Nov 23, 2024 11:56 AM Rea C wrote: Medication: benzonatate  (TESSALON ) 200 MG capsule  Has the patient contacted their pharmacy? Yes (Agent: If no, request that the patient contact the pharmacy for the refill. If patient does not wish to contact the pharmacy document the reason why and proceed with request.) (Agent: If yes, when and what did the pharmacy advise?)  This is the patient's preferred pharmacy:  [id number] [address], [place] - [address] [place] [address] Phone: [phone] Fax: [phone]   Is this the correct pharmacy for this prescription? Yes If no, delete pharmacy and type the correct one.   Has the prescription been filled recently? Yes  Is the patient out of the medication? Maybe two more   Has the patient been seen for an appointment in the last year OR does the patient have an upcoming appointment? Yes  Can we respond through MyChart? Yes  Agent: Please be advised that Rx refills may take up to 3 business days. We ask that you follow-up with your pharmacy.

## 2024-11-24 NOTE — Progress Notes (Signed)
 Cardiac Individual Treatment Plan  Patient Details  Name: Vincent Stevens MRN: [id number] Date of Birth: 10/04/1946 Referring Provider:   Flowsheet Row INTENSIVE CARDIAC REHAB ORIENT from 10/16/2024 in [facility]  Referring Provider Jennifer Crape, MD    Initial Encounter Date:  Flowsheet Row INTENSIVE CARDIAC REHAB ORIENT from 10/16/2024 in [facility]  Date 10/16/24    Visit Diagnosis: Chronic stable angina  Patient's Home Medications on Admission: Current Medications[1]  Past Medical History: Past Medical History:  Diagnosis Date   Allergic rhinitis 06/17/2021   Allergy    Angina pectoris 11/02/2023   Angina, class III 03/01/2018   Arthralgia of right knee 08/07/2024   Atherosclerosis of native coronary artery of native heart with stable angina pectoris 08/12/2024   BPH (benign prostatic hyperplasia) 09/19/2009   Qualifier: Diagnosis of  By: Jame  MD, Maude FALCON    Chest pain of uncertain etiology 10/20/2023   Coronary artery disease involving native coronary artery of native heart with angina pectoris    Diabetes mellitus due to underlying condition with unspecified complications (HCC) 09/13/2022   Dyslipidemia 08/14/2010   Qualifier: Diagnosis of  By: Jame  MD, Maude FALCON    Dyslipidemia associated with type 2 diabetes mellitus (HCC) 08/14/2010   Lab Results      Component    Value    Date           CHOL    134    04/19/2018           HDL    43    04/19/2018           LDLCALC    70    04/19/2018           TRIG    105    04/19/2018           CHOLHDL    3.1    04/19/2018           Dysuria 08/14/2024   Essential hypertension 01/17/2009   Qualifier: Diagnosis of  By: Jame  MD, Maude FALCON    GERD (gastroesophageal reflux disease) 09/09/2022   Gout    History of colonic polyps 09/27/2008   Qualifier: Diagnosis of  By: Jame  MD, Maude FALCON   Initial colonoscopy 2003.  Hyperplastic polyps only Followup colonoscopy 2008.  Normal  10 year interval recommended    Hx of CABG 03/01/2018   Left main coronary artery disease    Melanoma (HCC)    Near syncope 08/14/2024   Osteoarthritis of knee 08/07/2024   Pain in both lower extremities 08/15/2018   Prediabetes 09/13/2022   Sinus bradycardia 08/14/2024    Tobacco Use: Tobacco Use History[2]  Labs: Review Flowsheet  More data exists      Latest Ref Rng & Units 09/09/2022 03/14/2023 09/12/2023 03/01/2024 08/15/2024  Labs for ITP Cardiac and Pulmonary Rehab  Cholestrol 100 - 199 mg/dL 864  - 862  858  -  LDL (calc) 0 - 99 mg/dL 70  - 71  74  -  HDL-C >39 mg/dL 53.19  - 46.29  53  -  Trlycerides 0 - 149 mg/dL 07.9  - 35.9  68  -  Hemoglobin A1c 4.8 - 5.6 % 6.3  5.6  6.1  5.9  5.9     Capillary Blood Glucose: Lab Results  Component Value Date   GLUCAP 99 10/29/2024  GLUCAP 115 (H) 10/24/2024   GLUCAP 87 10/24/2024   GLUCAP 101 (H) 10/22/2024   GLUCAP 127 (H) 10/22/2024     Exercise Target Goals: Exercise Program Goal: Individual exercise prescription set using results from initial 6 min walk test and THRR while considering  patients activity barriers and safety.   Exercise Prescription Goal: Initial exercise prescription builds to 30-45 minutes a day of aerobic activity, 2-3 days per week.  Home exercise guidelines will be given to patient during program as part of exercise prescription that the participant will acknowledge.  Activity Barriers & Risk Stratification:  Activity Barriers & Cardiac Risk Stratification - 10/16/24 0901       Activity Barriers & Cardiac Risk Stratification   Activity Barriers Balance Concerns;Arthritis;Joint Problems;Back Problems;Deconditioning;Muscular Weakness;Chest Pain/Angina    Cardiac Risk Stratification High          6 Minute Walk:  6 Minute Walk     Row Name 10/16/24 1353         6 Minute Walk   Phase Initial  Used  Go-cart     Distance 767 feet     Walk Time 6 minutes     # of Rest Breaks 1  3:58-5:40     MPH 1.5     METS 1.52     RPE 11     Perceived Dyspnea  1     VO2 Peak 5.33     Symptoms Yes (comment)     Comments chronic pain posterior knee 5/10; chronic shoulders 5/10     Resting HR 56 bpm     Resting BP 93/51     Resting Oxygen Saturation  97 %     Exercise Oxygen Saturation  during 6 min walk 99 %     Max Ex. HR 102 bpm     Max Ex. BP 152/67     2 Minute Post BP 141/67        Oxygen Initial Assessment:   Oxygen Re-Evaluation:   Oxygen Discharge (Final Oxygen Re-Evaluation):   Initial Exercise Prescription:  Initial Exercise Prescription - 10/16/24 0800       Date of Initial Exercise RX and Referring Provider   Date 10/16/24    Referring Provider Jennifer Crape, MD    Expected Discharge Date 01/10/24      Recumbant Bike   Level 1    RPM 50    Watts 10    Minutes 15    METs 1.5      NuStep   Level 1    SPM 70    Minutes 15    METs 1.5      Prescription Details   Frequency (times per week) 3    Duration Progress to 30 minutes of continuous aerobic without signs/symptoms of physical distress      Intensity   THRR 40-80% of Max Heartrate 56-113    Ratings of Perceived Exertion 11-13    Perceived Dyspnea 0-4      Progression   Progression Continue progressive overload as per policy without signs/symptoms or physical distress.      Resistance Training   Training Prescription Yes    Weight 2 lbs    Reps 10-15          Perform Capillary Blood Glucose checks as needed.  Exercise Prescription Changes:   Exercise Prescription Changes     Row Name 10/22/24 1400 11/05/24 1013 11/19/24 1700 11/21/24 1200       Response to Exercise  Blood Pressure (Admit) 114/60 122/70 112/62 108/68    Blood Pressure (Exercise) 120/60 -- -- --    Blood Pressure (Exit) 104/60 106/60 98/68 106/64    Heart Rate (Admit) 70 bpm 65 bpm 57 bpm 76 bpm    Heart Rate  (Exercise) 71 bpm 105 bpm 79 bpm 90 bpm    Heart Rate (Exit) 71 bpm 67 bpm 60 bpm 57 bpm    Rating of Perceived Exertion (Exercise) 11 11 11 11     Symptoms None None None None    Comments Pt's first day in the CRP2 program Reviewed METs and goals Reviewed MEts and goals Reviewed home exercise Rx    Duration Continue with 30 min of aerobic exercise without signs/symptoms of physical distress. Continue with 30 min of aerobic exercise without signs/symptoms of physical distress. Continue with 30 min of aerobic exercise without signs/symptoms of physical distress. Continue with 30 min of aerobic exercise without signs/symptoms of physical distress.    Intensity THRR unchanged THRR unchanged THRR unchanged THRR unchanged      Progression   Progression Continue to progress workloads to maintain intensity without signs/symptoms of physical distress. Continue to progress workloads to maintain intensity without signs/symptoms of physical distress. Continue to progress workloads to maintain intensity without signs/symptoms of physical distress. Continue to progress workloads to maintain intensity without signs/symptoms of physical distress.    Average METs 2 2.1 1.9 1.95      Resistance Training   Training Prescription Yes Yes -- --    Weight 2 lbs 2 lbs 2 lbs No wts on wednesdays    Reps 10-15 10-15 10-15 --    Time 5 Minutes 5 Minutes 5 Minutes --      Interval Training   Interval Training No No No No      Recumbant Bike   Level 1 3 2 2     RPM 55 54 66 51    Watts 11 12 15 14     Minutes 15 15 15 15     METs 2.9 1.8 1.9 1.9      NuStep   Level 1 1 1 1     SPM 78 86 78 82    Minutes 15 15 15 15     METs 2.1 2.4 1.9 2      Home Exercise Plan   Plans to continue exercise at -- -- -- Home (comment)    Frequency -- -- -- Add 2 additional days to program exercise sessions.    Initial Home Exercises Provided -- -- -- 11/21/24       Exercise Comments:   Exercise Comments     Row Name  10/22/24 1429 11/05/24 1014 11/19/24 1710 11/21/24 1214     Exercise Comments Pt's first day in the CRP2 program. Pt exercised without complaints and is off to a good start. Reviewed METs, Pt is making slow progress. Will increase workloads ans tolerated. Reviewed METs and goals. METs have slipped. Pt not payin close attention to his workout. Pt get lost in watching TV and does not remained focused on worklout. will continue to encourage his to monitor is progress more closely. Reviewed home exercise Rx. Pt is doing some chair type exercises at home. Walking is difficult due to his knees. Pt will try doing chair exercises 2x/week at home 20-30 minutes. Pt was provided on hand out with links to chair exercise videos. Pt verbalized understanding of the home exercise Rx and was provided a copy.       Exercise  Goals and Review:   Exercise Goals     Row Name 10/16/24 0902             Exercise Goals   Increase Physical Activity Yes       Intervention Provide advice, education, support and counseling about physical activity/exercise needs.;Develop an individualized exercise prescription for aerobic and resistive training based on initial evaluation findings, risk stratification, comorbidities and participant's personal goals.       Expected Outcomes Short Term: Attend rehab on a regular basis to increase amount of physical activity.;Long Term: Add in home exercise to make exercise part of routine and to increase amount of physical activity.;Long Term: Exercising regularly at least 3-5 days a week.       Increase Strength and Stamina Yes       Intervention Provide advice, education, support and counseling about physical activity/exercise needs.;Develop an individualized exercise prescription for aerobic and resistive training based on initial evaluation findings, risk stratification, comorbidities and participant's personal goals.       Expected Outcomes Short Term: Increase workloads from initial  exercise prescription for resistance, speed, and METs.;Short Term: Perform resistance training exercises routinely during rehab and add in resistance training at home;Long Term: Improve cardiorespiratory fitness, muscular endurance and strength as measured by increased METs and functional capacity ( )       Able to understand and use rate of perceived exertion (RPE) scale Yes       Intervention Provide education and explanation on how to use RPE scale       Expected Outcomes Short Term: Able to use RPE daily in rehab to express subjective intensity level;Long Term:  Able to use RPE to guide intensity level when exercising independently       Knowledge and understanding of Target Heart Rate Range (THRR) Yes       Intervention Provide education and explanation of THRR including how the numbers were predicted and where they are located for reference       Expected Outcomes Short Term: Able to state/look up THRR;Long Term: Able to use THRR to govern intensity when exercising independently;Short Term: Able to use daily as guideline for intensity in rehab       Understanding of Exercise Prescription Yes       Intervention Provide education, explanation, and written materials on patient's individual exercise prescription       Expected Outcomes Short Term: Able to explain program exercise prescription;Long Term: Able to explain home exercise prescription to exercise independently          Exercise Goals Re-Evaluation :  Exercise Goals Re-Evaluation     Row Name 10/22/24 1427 11/19/24 1704           Exercise Goal Re-Evaluation   Exercise Goals Review Increase Physical Activity;Understanding of Exercise Prescription;Increase Strength and Stamina;Knowledge and understanding of Target Heart Rate Range (THRR);Able to understand and use rate of perceived exertion (RPE) scale Increase Physical Activity;Understanding of Exercise Prescription;Increase Strength and Stamina;Knowledge and understanding of  Target Heart Rate Range (THRR);Able to understand and use rate of perceived exertion (RPE) scale      Comments Pt's first day in the CRP2 program. Pt understands the exercise Rx, RPE sclae and THRR. Reviewed METs and goals with patient today. Pt voices some progress on his goals of moving around easier, having more energy, and improved strength and stamina. Pt also had goal to get back to yard work and was able to blow leaves this past weekend.  Expected Outcomes Will continue to monitor the patient and progress exercise workloads as tolerated. Will continue to monitor the patient and progress exercise workloads as tolerated.         Discharge Exercise Prescription (Final Exercise Prescription Changes):  Exercise Prescription Changes - 11/21/24 1200       Response to Exercise   Blood Pressure (Admit) 108/68    Blood Pressure (Exit) 106/64    Heart Rate (Admit) 76 bpm    Heart Rate (Exercise) 90 bpm    Heart Rate (Exit) 57 bpm    Rating of Perceived Exertion (Exercise) 11    Symptoms None    Comments Reviewed home exercise Rx    Duration Continue with 30 min of aerobic exercise without signs/symptoms of physical distress.    Intensity THRR unchanged      Progression   Progression Continue to progress workloads to maintain intensity without signs/symptoms of physical distress.    Average METs 1.95      Resistance Training   Weight No wts on wednesdays      Interval Training   Interval Training No      Recumbant Bike   Level 2    RPM 51    Watts 14    Minutes 15    METs 1.9      NuStep   Level 1    SPM 82    Minutes 15    METs 2      Home Exercise Plan   Plans to continue exercise at Home (comment)    Frequency Add 2 additional days to program exercise sessions.    Initial Home Exercises Provided 11/21/24          Nutrition:  Target Goals: Understanding of nutrition guidelines, daily intake of sodium 1500mg , cholesterol 200mg , calories 30% from fat and 7% or  less from saturated fats, daily to have 5 or more servings of fruits and vegetables.  Biometrics:  Pre Biometrics - 10/16/24 0834       Pre Biometrics   Waist Circumference 45 inches    Hip Circumference 43 inches    Waist to Hip Ratio 1.05 %    Triceps Skinfold 15 mm    % Body Fat 31.2 %    Grip Strength 15 kg    Flexibility --   pt c/o back issues not performed   Single Leg Stand 1.2 seconds           Nutrition Therapy Plan and Nutrition Goals:  Nutrition Therapy & Goals - 10/22/24 0938       Nutrition Therapy   Diet Heart Healthy    Drug/Food Interactions Statins/Certain Fruits      Personal Nutrition Goals   Nutrition Goal Patient to identify strategies for reducing cardiovascular risk by attending the [facility] education and nutrition series weekly.    Personal Goal #2 Patient to improve diet quality by using the plate method as a guide for meal planning to include lean protein/plant protein, fruits, vegetables, whole grains, nonfat dairy as part of a well-balanced diet.    Personal Goal #3 Patient to identify strategies for blood sugar control with goal A1c <7%.    Comments Patient with medical history significant for CAD with chronic stable angina, HTN, and DM2. Most recent A1c 5.9% on 08/15/24. Pt reports making recent dietary changes such as drinking primarly water. Identifies needs to make healthier food choices such as swapping out typical biscuit at breakfast for whole wheat toast. RD provided additional information regarding  heart healthy diet. Discussed importance of increasing fiber intake to help lower risk factors for heart disease and diabetes. Patient will benefit from participation in intensive cardiac rehab for nutrition education, exercise, and lifestyle modification.      Intervention Plan   Intervention Prescribe, educate and counsel regarding individualized specific dietary modifications aiming towards targeted core components such as weight, hypertension,  lipid management, diabetes, heart failure and other comorbidities.;Nutrition handout(s) given to patient.   Handout: [facility] Eating for a Healthy Heart   Expected Outcomes Short Term Goal: Understand basic principles of dietary content, such as calories, fat, sodium, cholesterol and nutrients.;Long Term Goal: Adherence to prescribed nutrition plan.          Nutrition Assessments:  MEDIFICTS Score Key: >=70 Need to make dietary changes  40-70 Heart Healthy Diet <= 40 Therapeutic Level Cholesterol Diet   Flowsheet Row INTENSIVE CARDIAC REHAB from 10/22/2024 in [facility]  Picture Your Plate Total Score on Admission 55   Picture Your Plate Scores: <59 Unhealthy dietary pattern with much room for improvement. 41-50 Dietary pattern unlikely to meet recommendations for good health and room for improvement. 51-60 More healthful dietary pattern, with some room for improvement.  >60 Healthy dietary pattern, although there may be some specific behaviors that could be improved.    Nutrition Goals Re-Evaluation:  Nutrition Goals Re-Evaluation     Row Name 11/14/24 1118             Goals   Current Weight 194 lb 0.1 oz (88 kg)       Nutrition Goal Patient to identify strategies for reducing cardiovascular risk by attending the [facility] education and nutrition series weekly.       Expected Outcome Goals in action. Patient with medical history significant for CAD with chronic stable angina, HTN, and DM2. Recent wt gain of 3 lb noted over past month. Pt reports making changes to diet such as restricting sugary snacks and opting for sauteed veggies in place of fries/chips. Typically eats high protein Cheerios with banana, 2% milk and orange juice for breakfast. Reports eating hot dog with sauteed onions for dinner last night. Reports eating ~ 2 meals daily. Pt states blood glucose remains in appropriate range. RD provided suggestions for  increasing protein, fiber at breakfast. Also encouraged pt to include small meal between lunch and dinner. Provided suggestions such as tuna salad with low sodium, whole grain crackers or apple slices with small amount of nut butter. Also encouraged pt to opt for skinless poultry/fish for evening meal. Patient will benefit from ongoing participation in intensive cardiac rehab for nutrition education, exercise, and lifestyle modification.         Personal Goal #2 Re-Evaluation   Personal Goal #2 Patient to improve diet quality by using the plate method as a guide for meal planning to include lean protein/plant protein, fruits, vegetables, whole grains, nonfat dairy as part of a well-balanced diet.         Personal Goal #3 Re-Evaluation   Personal Goal #3 Patient to identify strategies for blood sugar control with goal A1c <7%.          Nutrition Goals Re-Evaluation:  Nutrition Goals Re-Evaluation     Row Name 11/14/24 1118             Goals   Current Weight 194 lb 0.1 oz (88 kg)       Nutrition Goal Patient to identify strategies for reducing  cardiovascular risk by attending the [facility] education and nutrition series weekly.       Expected Outcome Goals in action. Patient with medical history significant for CAD with chronic stable angina, HTN, and DM2. Recent wt gain of 3 lb noted over past month. Pt reports making changes to diet such as restricting sugary snacks and opting for sauteed veggies in place of fries/chips. Typically eats high protein Cheerios with banana, 2% milk and orange juice for breakfast. Reports eating hot dog with sauteed onions for dinner last night. Reports eating ~ 2 meals daily. Pt states blood glucose remains in appropriate range. RD provided suggestions for increasing protein, fiber at breakfast. Also encouraged pt to include small meal between lunch and dinner. Provided suggestions such as tuna salad with low sodium, whole grain crackers or apple slices with small  amount of nut butter. Also encouraged pt to opt for skinless poultry/fish for evening meal. Patient will benefit from ongoing participation in intensive cardiac rehab for nutrition education, exercise, and lifestyle modification.         Personal Goal #2 Re-Evaluation   Personal Goal #2 Patient to improve diet quality by using the plate method as a guide for meal planning to include lean protein/plant protein, fruits, vegetables, whole grains, nonfat dairy as part of a well-balanced diet.         Personal Goal #3 Re-Evaluation   Personal Goal #3 Patient to identify strategies for blood sugar control with goal A1c <7%.          Nutrition Goals Discharge (Final Nutrition Goals Re-Evaluation):  Nutrition Goals Re-Evaluation - 11/14/24 1118       Goals   Current Weight 194 lb 0.1 oz (88 kg)    Nutrition Goal Patient to identify strategies for reducing cardiovascular risk by attending the [facility] education and nutrition series weekly.    Expected Outcome Goals in action. Patient with medical history significant for CAD with chronic stable angina, HTN, and DM2. Recent wt gain of 3 lb noted over past month. Pt reports making changes to diet such as restricting sugary snacks and opting for sauteed veggies in place of fries/chips. Typically eats high protein Cheerios with banana, 2% milk and orange juice for breakfast. Reports eating hot dog with sauteed onions for dinner last night. Reports eating ~ 2 meals daily. Pt states blood glucose remains in appropriate range. RD provided suggestions for increasing protein, fiber at breakfast. Also encouraged pt to include small meal between lunch and dinner. Provided suggestions such as tuna salad with low sodium, whole grain crackers or apple slices with small amount of nut butter. Also encouraged pt to opt for skinless poultry/fish for evening meal. Patient will benefit from ongoing participation in intensive cardiac rehab for nutrition education, exercise, and  lifestyle modification.      Personal Goal #2 Re-Evaluation   Personal Goal #2 Patient to improve diet quality by using the plate method as a guide for meal planning to include lean protein/plant protein, fruits, vegetables, whole grains, nonfat dairy as part of a well-balanced diet.      Personal Goal #3 Re-Evaluation   Personal Goal #3 Patient to identify strategies for blood sugar control with goal A1c <7%.          Psychosocial: Target Goals: Acknowledge presence or absence of significant depression and/or stress, maximize coping skills, provide positive support system. Participant is able to verbalize types and ability to use techniques and skills needed for reducing stress and depression.  Initial Review &  Psychosocial Screening:  Initial Psych Review & Screening - 10/16/24 0857       Initial Review   Current issues with Current Sleep Concerns;Current Anxiety/Panic      Family Dynamics   Good Support System? Yes   Pt has sposue and daughter for support   Comments Pt does voice low level anxiety in regards to his health issues.      Barriers   Psychosocial barriers to participate in program The patient should benefit from training in stress management and relaxation.      Screening Interventions   Interventions Encouraged to exercise    Expected Outcomes Short Term goal: Utilizing psychosocial counselor, staff and physician to assist with identification of specific Stressors or current issues interfering with healing process. Setting desired goal for each stressor or current issue identified.;Long Term Goal: Stressors or current issues are controlled or eliminated.;Short Term goal: Identification and review with participant of any Quality of Life or Depression concerns found by scoring the questionnaire.;Long Term goal: The participant improves quality of Life and PHQ9 Scores as seen by post scores and/or verbalization of changes          Quality of Life Scores:  Quality of  Life - 10/16/24 1349       Quality of Life   Select Quality of Life      Quality of Life Scores   Health/Function Pre 26.2 %    Socioeconomic Pre 26.5 %    Psych/Spiritual Pre 28.07 %    Family Pre 30 %    GLOBAL Pre 27.19 %         Scores of 19 and below usually indicate a poorer quality of life in these areas.  A difference of  2-3 points is a clinically meaningful difference.  A difference of 2-3 points in the total score of the Quality of Life Index has been associated with significant improvement in overall quality of life, self-image, physical symptoms, and general health in studies assessing change in quality of life.  PHQ-9: Review Flowsheet  More data exists      10/16/2024 09/27/2024 09/13/2024 08/23/2024 04/10/2024  Depression screen PHQ 2/9  Decreased Interest 1 0 0 0 0  Down, Depressed, Hopeless 0 0 0 0 0  PHQ - 2 Score 1 0 0 0 0  Altered sleeping 1 - - - -  Tired, decreased energy 1 - - - -  Change in appetite 1 - - - -  Feeling bad or failure about yourself  0 - - - -  Trouble concentrating 1 - - - -  Moving slowly or fidgety/restless 1 - - - -  Suicidal thoughts 0 - - - -  PHQ-9 Score 6 - - - -   Interpretation of Total Score  Total Score Depression Severity:  1-4 = Minimal depression, 5-9 = Mild depression, 10-14 = Moderate depression, 15-19 = Moderately severe depression, 20-27 = Severe depression   Psychosocial Evaluation and Intervention:   Psychosocial Re-Evaluation:  Psychosocial Re-Evaluation     Row Name 10/22/24 1637 11/24/24 1151           Psychosocial Re-Evaluation   Current issues with Current Anxiety/Panic;Current Sleep Concerns None Identified      Comments Vincent Stevens started cardiac rehab on 10/22/24. Reviewed PHQ9. Vincent Stevens says he has had some improvement in his energy level since his Ranexa  has been discontinued. Vincent Stevens says he has to get up frequently during the night and therefore affects his sleep. Vincent Stevens says he has an  appointment  with the urlogist and will discuss in the near future. Vincent Stevens has not voiced any increased concersn cardiac rehab      Expected Outcomes Vincent Stevens will have controlled or decreased anxitey stressors upon completion of cardiac rehab. Vincent Stevens will have controlled or decreased anxitey stressors upon completion of cardiac rehab.      Interventions Encouraged to attend Cardiac Rehabilitation for the exercise;Encouraged to attend Pulmonary Rehabilitation for the exercise;Stress management education Encouraged to attend Cardiac Rehabilitation for the exercise;Encouraged to attend Pulmonary Rehabilitation for the exercise;Stress management education      Continue Psychosocial Services  No Follow up required No Follow up required         Psychosocial Discharge (Final Psychosocial Re-Evaluation):  Psychosocial Re-Evaluation - 11/24/24 1151       Psychosocial Re-Evaluation   Current issues with None Identified    Comments Vincent Stevens has not voiced any increased concersn cardiac rehab    Expected Outcomes Vincent Stevens will have controlled or decreased anxitey stressors upon completion of cardiac rehab.    Interventions Encouraged to attend Cardiac Rehabilitation for the exercise;Encouraged to attend Pulmonary Rehabilitation for the exercise;Stress management education    Continue Psychosocial Services  No Follow up required          Vocational Rehabilitation: Provide vocational rehab assistance to qualifying candidates.   Vocational Rehab Evaluation & Intervention:  Vocational Rehab - 10/16/24 0856       Initial Vocational Rehab Evaluation & Intervention   Assessment shows need for Vocational Rehabilitation No          Education: Education Goals: Education classes will be provided on a weekly basis, covering required topics. Participant will state understanding/return demonstration of topics presented.    Education     Row Name 10/22/24 0800     Education   Cardiac Education Topics Pritikin    Glass Blower/designer Nutrition   Nutrition Workshop Targeting Your Nutrition Priorities   Instruction Review Code 1- Verbalizes Understanding   Class Start Time 0815   Class Stop Time 0901   Class Time Calculation (min) 46 min    Row Name 10/24/24 0800     Education   Cardiac Education Topics Pritikin   Secondary School Teacher School   Educator Dietitian   Weekly Topic One-Pot Wonders   Instruction Review Code 1- Verbalizes Understanding   Class Start Time [phone]   Class Stop Time 0858   Class Time Calculation (min) 44 min    Row Name 10/29/24 0800     Education   Cardiac Education Topics Pritikin   Select Workshops     Workshops   Educator Exercise Physiologist   Select Psychosocial   Psychosocial Workshop Focused Goals, Sustainable Changes   Instruction Review Code 1- Verbalizes Understanding   Class Start Time 0815   Class Stop Time 0850   Class Time Calculation (min) 35 min    Row Name 10/31/24 0900     Education   Cardiac Education Topics Pritikin   Orthoptist   Educator Dietitian   Weekly Topic Comforting Weekend Breakfasts   Instruction Review Code 1- Verbalizes Understanding   Class Start Time 0815   Class Stop Time 0857   Class Time Calculation (min) 42 min    Row Name 11/02/24 0800     Education   Cardiac Education Topics Pritikin   Select Core Videos  Core Videos   Educator Dietitian   Select Nutrition   Nutrition Dining Out - Part 1   Instruction Review Code 1- Verbalizes Understanding   Class Start Time [phone]   Class Stop Time 0853   Class Time Calculation (min) 37 min    Row Name 11/05/24 0800     Education   Cardiac Education Topics Pritikin   Select Core Videos     Core Videos   Educator Exercise Physiologist   Select Exercise Education   Exercise Education Biomechanial Limitations   Instruction Review Code 1- Verbalizes Understanding   Class  Start Time 0815   Class Stop Time 0855   Class Time Calculation (min) 40 min    Row Name 11/07/24 0800     Education   Cardiac Education Topics Pritikin   Secondary School Teacher School   Educator Dietitian   Weekly Topic Fast Evening Meals   Instruction Review Code 1- Verbalizes Understanding   Class Start Time 0815   Class Stop Time 0902   Class Time Calculation (min) 47 min    Row Name 11/09/24 0800     Education   Cardiac Education Topics Pritikin   Select Core Videos     Core Videos   Educator Dietitian   Select Nutrition   Nutrition Vitamins and Minerals   Instruction Review Code 1- Verbalizes Understanding   Class Start Time 0815   Class Stop Time 0856   Class Time Calculation (min) 41 min    Row Name 11/12/24 0700     Education   Cardiac Education Topics Pritikin   Select Core Videos     Core Videos   Educator Exercise Physiologist   Select Exercise Education   Exercise Education Improving Performance   Instruction Review Code 1- Verbalizes Understanding   Class Start Time 0820   Class Stop Time 0855   Class Time Calculation (min) 35 min    Row Name 11/14/24 0800     Education   Cardiac Education Topics Pritikin   Customer Service Manager   Weekly Topic [employer]- Spotlight on the [facility] Zones   Instruction Review Code 1- Verbalizes Understanding   Class Start Time 0815   Class Stop Time 0903   Class Time Calculation (min) 48 min    Row Name 11/16/24 0700     Education   Cardiac Education Topics Pritikin   Select Workshops     Core Videos   Educator --   Select --   Nutrition --   Instruction Review Code --     Workshops   Geophysical Data Processor Nutrition   Nutrition Workshop Fueling a Forensic Psychologist   Instruction Review Code 1- Verbalizes Understanding   Class Start Time 0815   Class Stop Time 0901   Class Time Calculation (min) 46 min    Row Name 11/19/24 0900      Education   Cardiac Education Topics Pritikin   Select Workshops     Workshops   Educator Exercise Physiologist   Select Psychosocial   Psychosocial Workshop Healthy Sleep for a Healthy Heart   Instruction Review Code 1- Verbalizes Understanding   Class Start Time 0815   Class Stop Time 0856   Class Time Calculation (min) 41 min    Row Name 11/21/24 0800     Education   Cardiac Education Topics Pritikin   [employer]  Midwife   Instruction Review Code 1- Verbalizes Understanding   Class Start Time 0815   Class Stop Time 0856   Class Time Calculation (min) 41 min    Row Name 11/26/24 0800     Education   Cardiac Education Topics Pritikin   Select Core Videos     Core Videos   Educator Nurse   Select Psychosocial   Psychosocial How Our Thoughts Can Heal Our Hearts   Instruction Review Code 1- Verbalizes Understanding   Class Start Time [phone]   Class Stop Time 0852   Class Time Calculation (min) 33 min      Core Videos: Exercise    Move It!  Clinical staff conducted group or individual video education with verbal and written material and guidebook.  Patient learns the recommended Pritikin exercise program. Exercise with the goal of living a long, healthy life. Some of the health benefits of exercise include controlled diabetes, healthier blood pressure levels, improved cholesterol levels, improved heart and lung capacity, improved sleep, and better body composition. Everyone should speak with their doctor before starting or changing an exercise routine.  Biomechanical Limitations Clinical staff conducted group or individual video education with verbal and written material and guidebook.  Patient learns how biomechanical limitations can impact exercise and how we can mitigate and possibly overcome limitations to have an impactful and balanced exercise routine.  Body Composition Clinical  staff conducted group or individual video education with verbal and written material and guidebook.  Patient learns that body composition (ratio of muscle mass to fat mass) is a key component to assessing overall fitness, rather than body weight alone. Increased fat mass, especially visceral belly fat, can put us  at increased risk for metabolic syndrome, type 2 diabetes, heart disease, and even death. It is recommended to combine diet and exercise (cardiovascular and resistance training) to improve your body composition. Seek guidance from your physician and exercise physiologist before implementing an exercise routine.  Exercise Action Plan Clinical staff conducted group or individual video education with verbal and written material and guidebook.  Patient learns the recommended strategies to achieve and enjoy long-term exercise adherence, including variety, self-motivation, self-efficacy, and positive decision making. Benefits of exercise include fitness, good health, weight management, more energy, better sleep, less stress, and overall well-being.  Medical   Heart Disease Risk Reduction Clinical staff conducted group or individual video education with verbal and written material and guidebook.  Patient learns our heart is our most vital organ as it circulates oxygen, nutrients, white blood cells, and hormones throughout the entire body, and carries waste away. Data supports a plant-based eating plan like the Pritikin Program for its effectiveness in slowing progression of and reversing heart disease. The video provides a number of recommendations to address heart disease.   Metabolic Syndrome and Belly Fat  Clinical staff conducted group or individual video education with verbal and written material and guidebook.  Patient learns what metabolic syndrome is, how it leads to heart disease, and how one can reverse it and keep it from coming back. You have metabolic syndrome if you have 3 of the  following 5 criteria: abdominal obesity, high blood pressure, high triglycerides, low HDL cholesterol, and high blood sugar.  Hypertension and Heart Disease Clinical staff conducted group or individual video education with verbal and written material and guidebook.  Patient learns that high blood pressure, or hypertension, is very common in the [place]. Hypertension is largely due to excessive salt intake, but other important risk factors include being overweight, physical inactivity, drinking too much alcohol, smoking, and not eating enough potassium from fruits and vegetables. High blood pressure is a leading risk factor for heart attack, stroke, congestive heart failure, dementia, kidney failure, and premature death. Long-term effects of excessive salt intake include stiffening of the arteries and thickening of heart muscle and organ damage. Recommendations include ways to reduce hypertension and the risk of heart disease.  Diseases of Our Time - Focusing on Diabetes Clinical staff conducted group or individual video education with verbal and written material and guidebook.  Patient learns why the best way to stop diseases of our time is prevention, through food and other lifestyle changes. Medicine (such as prescription pills and surgeries) is often only a Band-Aid on the problem, not a long-term solution. Most common diseases of our time include obesity, type 2 diabetes, hypertension, heart disease, and cancer. The Pritikin Program is recommended and has been proven to help reduce, reverse, and/or prevent the damaging effects of metabolic syndrome.  Nutrition   Overview of the Pritikin Eating Plan  Clinical staff conducted group or individual video education with verbal and written material and guidebook.  Patient learns about the Pritikin Eating Plan for disease risk reduction. The Pritikin Eating Plan emphasizes a wide variety of unrefined, minimally-processed carbohydrates, like fruits,  vegetables, whole grains, and legumes. Go, Caution, and Stop food choices are explained. Plant-based and lean animal proteins are emphasized. Rationale provided for low sodium intake for blood pressure control, low added sugars for blood sugar stabilization, and low added fats and oils for coronary artery disease risk reduction and weight management.  Calorie Density  Clinical staff conducted group or individual video education with verbal and written material and guidebook.  Patient learns about calorie density and how it impacts the Pritikin Eating Plan. Knowing the characteristics of the food you choose will help you decide whether those foods will lead to weight gain or weight loss, and whether you want to consume more or less of them. Weight loss is usually a side effect of the Pritikin Eating Plan because of its focus on low calorie-dense foods.  Label Reading  Clinical staff conducted group or individual video education with verbal and written material and guidebook.  Patient learns about the Pritikin recommended label reading guidelines and corresponding recommendations regarding calorie density, added sugars, sodium content, and whole grains.  Dining Out - Part 1  Clinical staff conducted group or individual video education with verbal and written material and guidebook.  Patient learns that restaurant meals can be sabotaging because they can be so high in calories, fat, sodium, and/or sugar. Patient learns recommended strategies on how to positively address this and avoid unhealthy pitfalls.  Facts on Fats  Clinical staff conducted group or individual video education with verbal and written material and guidebook.  Patient learns that lifestyle modifications can be just as effective, if not more so, as many medications for lowering your risk of heart disease. A Pritikin lifestyle can help to reduce your risk of inflammation and atherosclerosis (cholesterol build-up, or plaque, in the artery  walls). Lifestyle interventions such as dietary choices and physical activity address the cause of atherosclerosis. A review of the types of fats and their impact on blood cholesterol levels, along with dietary recommendations to reduce fat intake is also included.  Nutrition Action Plan  Clinical staff conducted group or individual video education with verbal and written  material and guidebook.  Patient learns how to incorporate Pritikin recommendations into their lifestyle. Recommendations include planning and keeping personal health goals in mind as an important part of their success.  Healthy Mind-Set    Healthy Minds, Bodies, Hearts  Clinical staff conducted group or individual video education with verbal and written material and guidebook.  Patient learns how to identify when they are stressed. Video will discuss the impact of that stress, as well as the many benefits of stress management. Patient will also be introduced to stress management techniques. The way we think, act, and feel has an impact on our hearts.  How Our Thoughts Can Heal Our Hearts  Clinical staff conducted group or individual video education with verbal and written material and guidebook.  Patient learns that negative thoughts can cause depression and anxiety. This can result in negative lifestyle behavior and serious health problems. Cognitive behavioral therapy is an effective method to help control our thoughts in order to change and improve our emotional outlook.  Additional Videos:  Exercise    Improving Performance  Clinical staff conducted group or individual video education with verbal and written material and guidebook.  Patient learns to use a non-linear approach by alternating intensity levels and lengths of time spent exercising to help burn more calories and lose more body fat. Cardiovascular exercise helps improve heart health, metabolism, hormonal balance, blood sugar control, and recovery from fatigue.  Resistance training improves strength, endurance, balance, coordination, reaction time, metabolism, and muscle mass. Flexibility exercise improves circulation, posture, and balance. Seek guidance from your physician and exercise physiologist before implementing an exercise routine and learn your capabilities and proper form for all exercise.  Introduction to Yoga  Clinical staff conducted group or individual video education with verbal and written material and guidebook.  Patient learns about yoga, a discipline of the coming together of mind, breath, and body. The benefits of yoga include improved flexibility, improved range of motion, better posture and core strength, increased lung function, weight loss, and positive self-image. Yogas heart health benefits include lowered blood pressure, healthier heart rate, decreased cholesterol and triglyceride levels, improved immune function, and reduced stress. Seek guidance from your physician and exercise physiologist before implementing an exercise routine and learn your capabilities and proper form for all exercise.  Medical   Aging: Enhancing Your Quality of Life  Clinical staff conducted group or individual video education with verbal and written material and guidebook.  Patient learns key strategies and recommendations to stay in good physical health and enhance quality of life, such as prevention strategies, having an advocate, securing a Health Care Proxy and Power of Attorney, and keeping a list of medications and system for tracking them. It also discusses how to avoid risk for bone loss.  Biology of Weight Control  Clinical staff conducted group or individual video education with verbal and written material and guidebook.  Patient learns that weight gain occurs because we consume more calories than we burn (eating more, moving less). Even if your body weight is normal, you may have higher ratios of fat compared to muscle mass. Too much body fat puts  you at increased risk for cardiovascular disease, heart attack, stroke, type 2 diabetes, and obesity-related cancers. In addition to exercise, following the Pritikin Eating Plan can help reduce your risk.  Decoding Lab Results  Clinical staff conducted group or individual video education with verbal and written material and guidebook.  Patient learns that lab test reflects one measurement whose values change over  time and are influenced by many factors, including medication, stress, sleep, exercise, food, hydration, pre-existing medical conditions, and more. It is recommended to use the knowledge from this video to become more involved with your lab results and evaluate your numbers to speak with your doctor.   Diseases of Our Time - Overview  Clinical staff conducted group or individual video education with verbal and written material and guidebook.  Patient learns that according to the CDC, 50% to 70% of chronic diseases (such as obesity, type 2 diabetes, elevated lipids, hypertension, and heart disease) are avoidable through lifestyle improvements including healthier food choices, listening to satiety cues, and increased physical activity.  Sleep Disorders Clinical staff conducted group or individual video education with verbal and written material and guidebook.  Patient learns how good quality and duration of sleep are important to overall health and well-being. Patient also learns about sleep disorders and how they impact health along with recommendations to address them, including discussing with a physician.  Nutrition  Dining Out - Part 2 Clinical staff conducted group or individual video education with verbal and written material and guidebook.  Patient learns how to plan ahead and communicate in order to maximize their dining experience in a healthy and nutritious manner. Included are recommended food choices based on the type of restaurant the patient is visiting.   Fueling a Publishing Rights Manager conducted group or individual video education with verbal and written material and guidebook.  There is a strong connection between our food choices and our health. Diseases like obesity and type 2 diabetes are very prevalent and are in large-part due to lifestyle choices. The Pritikin Eating Plan provides plenty of food and hunger-curbing satisfaction. It is easy to follow, affordable, and helps reduce health risks.  Menu Workshop  Clinical staff conducted group or individual video education with verbal and written material and guidebook.  Patient learns that restaurant meals can sabotage health goals because they are often packed with calories, fat, sodium, and sugar. Recommendations include strategies to plan ahead and to communicate with the manager, chef, or server to help order a healthier meal.  Planning Your Eating Strategy  Clinical staff conducted group or individual video education with verbal and written material and guidebook.  Patient learns about the Pritikin Eating Plan and its benefit of reducing the risk of disease. The Pritikin Eating Plan does not focus on calories. Instead, it emphasizes high-quality, nutrient-rich foods. By knowing the characteristics of the foods, we choose, we can determine their calorie density and make informed decisions.  Targeting Your Nutrition Priorities  Clinical staff conducted group or individual video education with verbal and written material and guidebook.  Patient learns that lifestyle habits have a tremendous impact on disease risk and progression. This video provides eating and physical activity recommendations based on your personal health goals, such as reducing LDL cholesterol, losing weight, preventing or controlling type 2 diabetes, and reducing high blood pressure.  Vitamins and Minerals  Clinical staff conducted group or individual video education with verbal and written material and guidebook.  Patient learns  different ways to obtain key vitamins and minerals, including through a recommended healthy diet. It is important to discuss all supplements you take with your doctor.   Healthy Mind-Set    Smoking Cessation  Clinical staff conducted group or individual video education with verbal and written material and guidebook.  Patient learns that cigarette smoking and tobacco addiction pose a serious health risk which affects millions of  people. Stopping smoking will significantly reduce the risk of heart disease, lung disease, and many forms of cancer. Recommended strategies for quitting are covered, including working with your doctor to develop a successful plan.  Culinary   Becoming a Set Designer conducted group or individual video education with verbal and written material and guidebook.  Patient learns that cooking at home can be healthy, cost-effective, quick, and puts them in control. Keys to cooking healthy recipes will include looking at your recipe, assessing your equipment needs, planning ahead, making it simple, choosing cost-effective seasonal ingredients, and limiting the use of added fats, salts, and sugars.  Cooking - Breakfast and Snacks  Clinical staff conducted group or individual video education with verbal and written material and guidebook.  Patient learns how important breakfast is to satiety and nutrition through the entire day. Recommendations include key foods to eat during breakfast to help stabilize blood sugar levels and to prevent overeating at meals later in the day. Planning ahead is also a key component.  Cooking - Educational Psychologist conducted group or individual video education with verbal and written material and guidebook.  Patient learns eating strategies to improve overall health, including an approach to cook more at home. Recommendations include thinking of animal protein as a side on your plate rather than center stage and focusing  instead on lower calorie dense options like vegetables, fruits, whole grains, and plant-based proteins, such as beans. Making sauces in large quantities to freeze for later and leaving the skin on your vegetables are also recommended to maximize your experience.  Cooking - Healthy Salads and Dressing Clinical staff conducted group or individual video education with verbal and written material and guidebook.  Patient learns that vegetables, fruits, whole grains, and legumes are the foundations of the Pritikin Eating Plan. Recommendations include how to incorporate each of these in flavorful and healthy salads, and how to create homemade salad dressings. Proper handling of ingredients is also covered. Cooking - Soups and [employer] - Soups and Desserts Clinical staff conducted group or individual video education with verbal and written material and guidebook.  Patient learns that Pritikin soups and desserts make for easy, nutritious, and delicious snacks and meal components that are low in sodium, fat, sugar, and calorie density, while high in vitamins, minerals, and filling fiber. Recommendations include simple and healthy ideas for soups and desserts.   Overview     The Pritikin Solution Program Overview Clinical staff conducted group or individual video education with verbal and written material and guidebook.  Patient learns that the results of the Pritikin Program have been documented in more than 100 articles published in peer-reviewed journals, and the benefits include reducing risk factors for (and, in some cases, even reversing) high cholesterol, high blood pressure, type 2 diabetes, obesity, and more! An overview of the three key pillars of the Pritikin Program will be covered: eating well, doing regular exercise, and having a healthy mind-set.  WORKSHOPS  Exercise: Exercise Basics: Building Your Action Plan Clinical staff led group instruction and group discussion with PowerPoint  presentation and patient guidebook. To enhance the learning environment the use of posters, models and videos may be added. At the conclusion of this workshop, patients will comprehend the difference between physical activity and exercise, as well as the benefits of incorporating both, into their routine. Patients will understand the FITT (Frequency, Intensity, Time, and Type) principle and how to use it to build an exercise action  plan. In addition, safety concerns and other considerations for exercise and cardiac rehab will be addressed by the presenter. The purpose of this lesson is to promote a comprehensive and effective weekly exercise routine in order to improve patients overall level of fitness.   Managing Heart Disease: Your Path to a Healthier Heart Clinical staff led group instruction and group discussion with PowerPoint presentation and patient guidebook. To enhance the learning environment the use of posters, models and videos may be added.At the conclusion of this workshop, patients will understand the anatomy and physiology of the heart. Additionally, they will understand how Pritikins three pillars impact the risk factors, the progression, and the management of heart disease.  The purpose of this lesson is to provide a high-level overview of the heart, heart disease, and how the Pritikin lifestyle positively impacts risk factors.  Exercise Biomechanics Clinical staff led group instruction and group discussion with PowerPoint presentation and patient guidebook. To enhance the learning environment the use of posters, models and videos may be added. Patients will learn how the structural parts of their bodies function and how these functions impact their daily activities, movement, and exercise. Patients will learn how to promote a neutral spine, learn how to manage pain, and identify ways to improve their physical movement in order to promote healthy living. The purpose of this  lesson is to expose patients to common physical limitations that impact physical activity. Participants will learn practical ways to adapt and manage aches and pains, and to minimize their effect on regular exercise. Patients will learn how to maintain good posture while sitting, walking, and lifting.  Balance Training and Fall Prevention  Clinical staff led group instruction and group discussion with PowerPoint presentation and patient guidebook. To enhance the learning environment the use of posters, models and videos may be added. At the conclusion of this workshop, patients will understand the importance of their sensorimotor skills (vision, proprioception, and the vestibular system) in maintaining their ability to balance as they age. Patients will apply a variety of balancing exercises that are appropriate for their current level of function. Patients will understand the common causes for poor balance, possible solutions to these problems, and ways to modify their physical environment in order to minimize their fall risk. The purpose of this lesson is to teach patients about the importance of maintaining balance as they age and ways to minimize their risk of falling.  WORKSHOPS   Nutrition:  Fueling a Ship Broker led group instruction and group discussion with PowerPoint presentation and patient guidebook. To enhance the learning environment the use of posters, models and videos may be added. Patients will review the foundational principles of the Pritikin Eating Plan and understand what constitutes a serving size in each of the food groups. Patients will also learn Pritikin-friendly foods that are better choices when away from home and review make-ahead meal and snack options. Calorie density will be reviewed and applied to three nutrition priorities: weight maintenance, weight loss, and weight gain. The purpose of this lesson is to reinforce (in a group setting) the key  concepts around what patients are recommended to eat and how to apply these guidelines when away from home by planning and selecting Pritikin-friendly options. Patients will understand how calorie density may be adjusted for different weight management goals.  Mindful Eating  Clinical staff led group instruction and group discussion with PowerPoint presentation and patient guidebook. To enhance the learning environment the use of posters, models and videos may  be added. Patients will briefly review the concepts of the Pritikin Eating Plan and the importance of low-calorie dense foods. The concept of mindful eating will be introduced as well as the importance of paying attention to internal hunger signals. Triggers for non-hunger eating and techniques for dealing with triggers will be explored. The purpose of this lesson is to provide patients with the opportunity to review the basic principles of the Pritikin Eating Plan, discuss the value of eating mindfully and how to measure internal cues of hunger and fullness using the Hunger Scale. Patients will also discuss reasons for non-hunger eating and learn strategies to use for controlling emotional eating.  Targeting Your Nutrition Priorities Clinical staff led group instruction and group discussion with PowerPoint presentation and patient guidebook. To enhance the learning environment the use of posters, models and videos may be added. Patients will learn how to determine their genetic susceptibility to disease by reviewing their family history. Patients will gain insight into the importance of diet as part of an overall healthy lifestyle in mitigating the impact of genetics and other environmental insults. The purpose of this lesson is to provide patients with the opportunity to assess their personal nutrition priorities by looking at their family history, their own health history and current risk factors. Patients will also be able to discuss ways of  prioritizing and modifying the Pritikin Eating Plan for their highest risk areas  Menu  Clinical staff led group instruction and group discussion with PowerPoint presentation and patient guidebook. To enhance the learning environment the use of posters, models and videos may be added. Using menus brought in from [employer], or printed from [employer], patients will apply the Pritikin dining out guidelines that were presented in the [employer] video. Patients will also be able to practice these guidelines in a variety of provided scenarios. The purpose of this lesson is to provide patients with the opportunity to practice hands-on learning of the Pritikin Dining Out guidelines with actual menus and practice scenarios.  Label Reading Clinical staff led group instruction and group discussion with PowerPoint presentation and patient guidebook. To enhance the learning environment the use of posters, models and videos may be added. Patients will review and discuss the Pritikin label reading guidelines presented in Pritikins Label Reading Educational series video. Using fool labels brought in from local grocery stores and markets, patients will apply the label reading guidelines and determine if the packaged food meet the Pritikin guidelines. The purpose of this lesson is to provide patients with the opportunity to review, discuss, and practice hands-on learning of the Pritikin Label Reading guidelines with actual packaged food labels. Cooking School  Pritikins [employer] are designed to teach patients ways to prepare quick, simple, and affordable recipes at home. The importance of nutritions role in chronic disease risk reduction is reflected in its emphasis in the overall Pritikin program. By learning how to prepare essential core Pritikin Eating Plan recipes, patients will increase control over what they eat; be able to customize the flavor of foods without the use  of added salt, sugar, or fat; and improve the quality of the food they consume. By learning a set of core recipes which are easily assembled, quickly prepared, and affordable, patients are more likely to prepare more healthy foods at home. These workshops focus on convenient breakfasts, simple entres, side dishes, and desserts which can be prepared with minimal effort and are consistent with nutrition recommendations for cardiovascular risk reduction. Cooking School  Workshops are taught by a armed forces logistics/support/administrative officer (RD) who has been trained by the [employer] team. The chef or RD has a clear understanding of the importance of minimizing - if not completely eliminating - added fat, sugar, and sodium in recipes. Throughout the series of Cooking School Workshop sessions, patients will learn about healthy ingredients and efficient methods of cooking to build confidence in their capability to prepare    Cooking School weekly topics:  Adding Flavor- Sodium-Free  Fast and Healthy Breakfasts  Powerhouse Plant-Based Proteins  Satisfying Salads and Dressings  Simple Sides and Sauces  International Cuisine-Spotlight on the [employer] Zones  Delicious Desserts  Savory Soups  [employer] - Meals in a Astronomer Appetizers and Snacks  Comforting Weekend Breakfasts  One-Pot Wonders   Fast Evening Meals  Landscape Architect Your Pritikin Plate  WORKSHOPS   Healthy Mindset (Psychosocial):  Focused Goals, Sustainable Changes Clinical staff led group instruction and group discussion with PowerPoint presentation and patient guidebook. To enhance the learning environment the use of posters, models and videos may be added. Patients will be able to apply effective goal setting strategies to establish at least one personal goal, and then take consistent, meaningful action toward that goal. They will learn to identify common barriers to achieving personal goals and develop strategies  to overcome them. Patients will also gain an understanding of how our mind-set can impact our ability to achieve goals and the importance of cultivating a positive and growth-oriented mind-set. The purpose of this lesson is to provide patients with a deeper understanding of how to set and achieve personal goals, as well as the tools and strategies needed to overcome common obstacles which may arise along the way.  From Head to Heart: The Power of a Healthy Outlook  Clinical staff led group instruction and group discussion with PowerPoint presentation and patient guidebook. To enhance the learning environment the use of posters, models and videos may be added. Patients will be able to recognize and describe the impact of emotions and mood on physical health. They will discover the importance of self-care and explore self-care practices which may work for them. Patients will also learn how to utilize the 4 Cs to cultivate a healthier outlook and better manage stress and challenges. The purpose of this lesson is to demonstrate to patients how a healthy outlook is an essential part of maintaining good health, especially as they continue their cardiac rehab journey.  Healthy Sleep for a Healthy Heart Clinical staff led group instruction and group discussion with PowerPoint presentation and patient guidebook. To enhance the learning environment the use of posters, models and videos may be added. At the conclusion of this workshop, patients will be able to demonstrate knowledge of the importance of sleep to overall health, well-being, and quality of life. They will understand the symptoms of, and treatments for, common sleep disorders. Patients will also be able to identify daytime and nighttime behaviors which impact sleep, and they will be able to apply these tools to help manage sleep-related challenges. The purpose of this lesson is to provide patients with a general overview of sleep and outline the importance  of quality sleep. Patients will learn about a few of the most common sleep disorders. Patients will also be introduced to the concept of sleep hygiene, and discover ways to self-manage certain sleeping problems through simple daily behavior changes. Finally, the workshop will motivate patients by clarifying the links between quality sleep and  their goals of heart-healthy living.   Recognizing and Reducing Stress Clinical staff led group instruction and group discussion with PowerPoint presentation and patient guidebook. To enhance the learning environment the use of posters, models and videos may be added. At the conclusion of this workshop, patients will be able to understand the types of stress reactions, differentiate between acute and chronic stress, and recognize the impact that chronic stress has on their health. They will also be able to apply different coping mechanisms, such as reframing negative self-talk. Patients will have the opportunity to practice a variety of stress management techniques, such as deep abdominal breathing, progressive muscle relaxation, and/or guided imagery.  The purpose of this lesson is to educate patients on the role of stress in their lives and to provide healthy techniques for coping with it.  Learning Barriers/Preferences:  Learning Barriers/Preferences - 10/16/24 1350       Learning Barriers/Preferences   Learning Barriers Sight    Learning Preferences Audio;Computer/Internet;Group Instruction;Individual Instruction;Pictoral;Skilled Demonstration;Verbal Instruction;Video;Written Material          Education Topics:  Knowledge Questionnaire Score:  Knowledge Questionnaire Score - 10/16/24 1351       Knowledge Questionnaire Score   Pre Score 18/24          Core Components/Risk Factors/Patient Goals at Admission:  Personal Goals and Risk Factors at Admission - 10/16/24 0856       Core Components/Risk Factors/Patient Goals on Admission    Diabetes Yes    Intervention Provide education about signs/symptoms and action to take for hypo/hyperglycemia.;Provide education about proper nutrition, including hydration, and aerobic/resistive exercise prescription along with prescribed medications to achieve blood glucose in normal ranges: Fasting glucose 65-99 mg/dL    Expected Outcomes Short Term: Participant verbalizes understanding of the signs/symptoms and immediate care of hyper/hypoglycemia, proper foot care and importance of medication, aerobic/resistive exercise and nutrition plan for blood glucose control.;Long Term: Attainment of HbA1C < 7%.    Hypertension Yes    Intervention Provide education on lifestyle modifcations including regular physical activity/exercise, weight management, moderate sodium restriction and increased consumption of fresh fruit, vegetables, and low fat dairy, alcohol moderation, and smoking cessation.;Monitor prescription use compliance.    Expected Outcomes Long Term: Maintenance of blood pressure at goal levels.;Short Term: Continued assessment and intervention until BP is < 140/26mm HG in hypertensive participants. < 130/30mm HG in hypertensive participants with diabetes, heart failure or chronic kidney disease.    Lipids Yes    Intervention Provide education and support for participant on nutrition & aerobic/resistive exercise along with prescribed medications to achieve LDL 70mg , HDL >40mg .    Expected Outcomes Short Term: Participant states understanding of desired cholesterol values and is compliant with medications prescribed. Participant is following exercise prescription and nutrition guidelines.;Long Term: Cholesterol controlled with medications as prescribed, with individualized exercise RX and with personalized nutrition plan. Value goals: LDL < 70mg , HDL > 40 mg.          Core Components/Risk Factors/Patient Goals Review:   Goals and Risk Factor Review     Row Name 10/22/24 1643 10/29/24 1707  11/24/24 1156         Core Components/Risk Factors/Patient Goals Review   Personal Goals Review Weight Management/Obesity;Hypertension;Lipids;Diabetes Weight Management/Obesity;Hypertension;Lipids;Diabetes Weight Management/Obesity;Hypertension;Lipids;Diabetes     Review Skip started cardiac rehab on 10/22/24. Nicolo did well with exercise for his fitness level. vital signs and CBg's were WNL. Denarius is somewhat deconditioned but said that he felt better after exercising. Duwane started cardiac rehab on 10/22/24.  Murphy is doing well with exercise for his fitness level. vital signs and CBg's have been within normal limits. Kessler continues to do  well with exercise for his fitness level. Vital signs and CBG's remain stable.     Expected Outcomes Nasiah will continue to participate in cardiac rehab for exercise, nutrition and lifestyle modifications. Jakoby will continue to participate in cardiac rehab for exercise, nutrition and lifestyle modifications. Able will continue to participate in cardiac rehab for exercise, nutrition and lifestyle modifications.        Core Components/Risk Factors/Patient Goals at Discharge (Final Review):   Goals and Risk Factor Review - 11/24/24 1156       Core Components/Risk Factors/Patient Goals Review   Personal Goals Review Weight Management/Obesity;Hypertension;Lipids;Diabetes    Review Vincent Stevens continues to do  well with exercise for his fitness level. Vital signs and CBG's remain stable.    Expected Outcomes Vincent Stevens will continue to participate in cardiac rehab for exercise, nutrition and lifestyle modifications.          ITP Comments:  ITP Comments     Row Name 10/16/24 9164 10/22/24 1632 10/29/24 1705 11/24/24 1148     ITP Comments Vincent Bihari, MD: Medical Director. Introduction to the Pritikin Education program / Intensive Cardiac Rehab.  Initial orientation packet reviewed with the patient. 30 Day ITP Review. Zyhir started cardiac  rehab on 10/22/24. Karlin did 30 Day ITP Review. Mallie started cardiac rehab on 10/22/24. Kaiden is off to a good start to exercise for his fitness level. 30 Day ITP Review. Chaise has good attendance and participation with exercise at cardiac rehab       Comments: See ITP comments.Hadassah Elpidio Quan RN BSN      [1]  Current Outpatient Medications:    alfuzosin  (UROXATRAL ) 10 MG 24 hr tablet, Take 1 tablet (10 mg total) by mouth at bedtime., Disp: 30 tablet, Rfl: 1   aspirin  EC 81 MG tablet, Take 1 tablet (81 mg total) by mouth daily., Disp: , Rfl:    azelastine  (ASTELIN ) 0.1 % nasal spray, Place 2 sprays into both nostrils 2 (two) times daily., Disp: 30 mL, Rfl: 12   benzonatate  (TESSALON ) 200 MG capsule, Take 1 capsule (200 mg total) by mouth 2 (two) times daily as needed for cough., Disp: 20 capsule, Rfl: 0   Blood Glucose Monitoring Suppl DEVI, 1 each by Does not apply route as directed. Dispense based on patient and insurance preference. Use up to four times daily as directed. DX E11.69, Disp: 1 each, Rfl: 0   finasteride  (PROSCAR ) 5 MG tablet, TAKE 1 TABLET (5 MG TOTAL) BY MOUTH DAILY., Disp: 90 tablet, Rfl: 0   Glucose Blood (BLOOD GLUCOSE TEST STRIPS) STRP, 1 each by Does not apply route as directed. Dispense based on patient and insurance preference. Use up to four times daily as directed. Dx E11.9., Disp: 100 strip, Rfl: 0   ibuprofen  (ADVIL ,MOTRIN ) 200 MG tablet, Take 200 mg by mouth 2 (two) times daily as needed (arthritis apin). , Disp: , Rfl:    isosorbide  mononitrate (IMDUR ) 30 MG 24 hr tablet, Take 1 tablet (30 mg total) by mouth every morning., Disp: 30 tablet, Rfl: 2   Lancet Device MISC, 1 each by Does not apply route as directed. Dispense based on patient and insurance preference. Use up to four times daily as directed. Dx E11.69, Disp: 1 each, Rfl: 0   Lancets MISC, 1 each by Does not apply route as directed. Dispense based on patient and  insurance preference. Use up  to four times daily as directed. E11.69, Disp: 100 each, Rfl: 0   metFORMIN  (GLUCOPHAGE ) 500 MG tablet, Take 1 tablet (500 mg total) by mouth daily with breakfast., Disp: 90 tablet, Rfl: 1   metoprolol  succinate (TOPROL -XL) 25 MG 24 hr tablet, Take 1 tablet (25 mg total) by mouth at bedtime. TAKE WITH OR IMMEDIATELY FOLLOWING A MEAL., Disp: 90 tablet, Rfl: 3   Multiple Vitamin (MULTIVITAMIN WITH MINERALS) TABS tablet, Take 1 tablet by mouth at bedtime., Disp: , Rfl:    nitroGLYCERIN  (NITROSTAT ) 0.4 MG SL tablet, Place 0.4 mg under the tongue every 5 (five) minutes as needed for chest pain., Disp: , Rfl:    simvastatin  (ZOCOR ) 80 MG tablet, TAKE 1 TABLET DAILY. PATIENT NEEDS AN APPOINTMENT FOR FURTHER REFILLS. 3 RD/FINAL ATTEMPT, Disp: 90 tablet, Rfl: 1   triamcinolone  ointment (KENALOG ) 0.5 %, APPLY 1 APPLICATION TOPICALLY 2 (TWO) TIMES DAILY., Disp: 30 g, Rfl: 5 [2]  Social History Tobacco Use  Smoking Status Former   Current packs/day: 0.00   Types: Cigarettes   Quit date: 11/29/1980   Years since quitting: 44.0  Smokeless Tobacco Never

## 2024-11-26 ENCOUNTER — Encounter (HOSPITAL_COMMUNITY)
Admission: RE | Admit: 2024-11-26 | Discharge: 2024-11-26 | Disposition: A | Discharge status: Home / Self Care | Source: Ambulatory Visit | Attending: Cardiology | Admitting: Cardiology

## 2024-11-26 DIAGNOSIS — I2089 Other forms of angina pectoris: Secondary | ICD-10-CM

## 2024-11-26 MED ORDER — BENZONATATE 200 MG PO CAPS: 200.0000 mg | ORAL_CAPSULE | Freq: Two times a day (BID) | ORAL | 0 refills | Status: DC | PRN

## 2024-11-28 ENCOUNTER — Encounter (HOSPITAL_COMMUNITY)
Admission: RE | Admit: 2024-11-28 | Discharge: 2024-11-28 | Disposition: A | Discharge status: Home / Self Care | Source: Ambulatory Visit | Attending: Cardiology | Admitting: Cardiology

## 2024-11-28 DIAGNOSIS — I2089 Other forms of angina pectoris: Secondary | ICD-10-CM | POA: Diagnosis not present

## 2024-11-30 ENCOUNTER — Encounter (HOSPITAL_COMMUNITY)
Admission: RE | Admit: 2024-11-30 | Discharge: 2024-11-30 | Disposition: A | Discharge status: Home / Self Care | Source: Ambulatory Visit | Attending: Cardiology | Admitting: Cardiology

## 2024-11-30 DIAGNOSIS — I25119 Atherosclerotic heart disease of native coronary artery with unspecified angina pectoris: Secondary | ICD-10-CM | POA: Diagnosis not present

## 2024-11-30 DIAGNOSIS — Z48812 Encounter for surgical aftercare following surgery on the circulatory system: Secondary | ICD-10-CM | POA: Insufficient documentation

## 2024-11-30 DIAGNOSIS — Z951 Presence of aortocoronary bypass graft: Secondary | ICD-10-CM | POA: Insufficient documentation

## 2024-11-30 DIAGNOSIS — R079 Chest pain, unspecified: Secondary | ICD-10-CM | POA: Insufficient documentation

## 2024-11-30 DIAGNOSIS — R001 Bradycardia, unspecified: Secondary | ICD-10-CM | POA: Diagnosis not present

## 2024-11-30 DIAGNOSIS — Z87891 Personal history of nicotine dependence: Secondary | ICD-10-CM | POA: Insufficient documentation

## 2024-11-30 DIAGNOSIS — I491 Atrial premature depolarization: Secondary | ICD-10-CM | POA: Diagnosis not present

## 2024-11-30 DIAGNOSIS — I2089 Other forms of angina pectoris: Secondary | ICD-10-CM

## 2024-12-03 ENCOUNTER — Encounter (HOSPITAL_COMMUNITY)
Admission: RE | Admit: 2024-12-03 | Discharge: 2024-12-03 | Disposition: A | Discharge status: Home / Self Care | Source: Ambulatory Visit | Attending: Cardiology | Admitting: Cardiology

## 2024-12-03 ENCOUNTER — Telehealth: Payer: Self-pay | Admitting: Family Medicine

## 2024-12-03 DIAGNOSIS — Z48812 Encounter for surgical aftercare following surgery on the circulatory system: Secondary | ICD-10-CM | POA: Diagnosis not present

## 2024-12-03 DIAGNOSIS — I2089 Other forms of angina pectoris: Secondary | ICD-10-CM

## 2024-12-03 NOTE — Telephone Encounter (Signed)
" °  Encourage patient to contact the pharmacy for refills or they can request refills through [facility]  LAST APPOINTMENT DATE:  Please schedule appointment if longer than 1 year  NEXT APPOINTMENT DATE: 03/15/2025  MEDICATION: isosorbide  mononitrate (IMDUR ) 30 MG 24 hr tablet    Is the patient out of medication? 12/08/24  PHARMACY: [id number] - [place], [place] - [address] (Ph: [phone])   Let patient know to contact pharmacy at the end of the day to make sure medication is ready.  Please notify patient to allow 48-72 hours to process    AND     Encourage patient to contact the pharmacy for refills or they can request refills through [facility]  LAST APPOINTMENT DATE:  Please schedule appointment if longer than 1 year  NEXT APPOINTMENT DATE: 03/15/2025  MEDICATION: alfuzosin  (UROXATRAL ) 10 MG 24 hr tablet    Is the patient out of medication? By 12/08/2024  PHARMACY: [id number] [place], [place] - [address] (Ph: [phone])   Let patient know to contact pharmacy at the end of the day to make sure medication is ready.  Please notify patient to allow 48-72 hours to process  "

## 2024-12-04 ENCOUNTER — Other Ambulatory Visit: Payer: Self-pay | Admitting: *Deleted

## 2024-12-04 MED ORDER — ISOSORBIDE MONONITRATE ER 30 MG PO TB24: 30.0000 mg | ORAL_TABLET | Freq: Every morning | ORAL | 2 refills | Status: AC

## 2024-12-04 MED ORDER — ALFUZOSIN HCL ER 10 MG PO TB24: 10.0000 mg | ORAL_TABLET | Freq: Every day | ORAL | 1 refills | Status: AC

## 2024-12-04 NOTE — Telephone Encounter (Signed)
 Patient requesting refill Rx Imdur  last prescribed by  Raenelle Donalda HERO, MD

## 2024-12-04 NOTE — Telephone Encounter (Signed)
 Ok with me. Please place any necessary orders.

## 2024-12-04 NOTE — Telephone Encounter (Signed)
 Rx Imdur  [facility] to [facility]

## 2024-12-05 ENCOUNTER — Encounter (HOSPITAL_COMMUNITY)
Admission: RE | Admit: 2024-12-05 | Discharge: 2024-12-05 | Disposition: A | Discharge status: Home / Self Care | Source: Ambulatory Visit | Attending: Cardiology | Admitting: Cardiology

## 2024-12-05 DIAGNOSIS — I2089 Other forms of angina pectoris: Secondary | ICD-10-CM

## 2024-12-05 DIAGNOSIS — Z48812 Encounter for surgical aftercare following surgery on the circulatory system: Secondary | ICD-10-CM | POA: Diagnosis not present

## 2024-12-07 ENCOUNTER — Encounter (HOSPITAL_COMMUNITY)
Admission: RE | Admit: 2024-12-07 | Discharge: 2024-12-07 | Disposition: A | Source: Ambulatory Visit | Attending: Cardiology

## 2024-12-07 DIAGNOSIS — I2089 Other forms of angina pectoris: Secondary | ICD-10-CM

## 2024-12-07 DIAGNOSIS — Z48812 Encounter for surgical aftercare following surgery on the circulatory system: Secondary | ICD-10-CM | POA: Diagnosis not present

## 2024-12-10 ENCOUNTER — Encounter (HOSPITAL_COMMUNITY)
Admission: RE | Admit: 2024-12-10 | Discharge: 2024-12-10 | Disposition: A | Source: Ambulatory Visit | Attending: Cardiology

## 2024-12-10 DIAGNOSIS — I2089 Other forms of angina pectoris: Secondary | ICD-10-CM

## 2024-12-10 DIAGNOSIS — Z48812 Encounter for surgical aftercare following surgery on the circulatory system: Secondary | ICD-10-CM | POA: Diagnosis not present

## 2024-12-12 ENCOUNTER — Encounter (HOSPITAL_COMMUNITY)
Admission: RE | Admit: 2024-12-12 | Discharge: 2024-12-12 | Disposition: A | Discharge status: Home / Self Care | Source: Ambulatory Visit | Attending: Cardiology | Admitting: Cardiology

## 2024-12-12 DIAGNOSIS — Z48812 Encounter for surgical aftercare following surgery on the circulatory system: Secondary | ICD-10-CM | POA: Diagnosis not present

## 2024-12-12 DIAGNOSIS — I2089 Other forms of angina pectoris: Secondary | ICD-10-CM

## 2024-12-14 ENCOUNTER — Encounter (HOSPITAL_COMMUNITY)
Admission: RE | Admit: 2024-12-14 | Discharge: 2024-12-14 | Disposition: A | Discharge status: Home / Self Care | Source: Ambulatory Visit | Attending: Cardiology | Admitting: Cardiology

## 2024-12-14 DIAGNOSIS — I2089 Other forms of angina pectoris: Secondary | ICD-10-CM

## 2024-12-14 DIAGNOSIS — Z48812 Encounter for surgical aftercare following surgery on the circulatory system: Secondary | ICD-10-CM | POA: Diagnosis not present

## 2024-12-17 ENCOUNTER — Encounter (HOSPITAL_COMMUNITY)
Admission: RE | Admit: 2024-12-17 | Discharge: 2024-12-17 | Disposition: A | Source: Ambulatory Visit | Attending: Cardiology

## 2024-12-17 DIAGNOSIS — I2089 Other forms of angina pectoris: Secondary | ICD-10-CM

## 2024-12-17 DIAGNOSIS — Z48812 Encounter for surgical aftercare following surgery on the circulatory system: Secondary | ICD-10-CM | POA: Diagnosis not present

## 2024-12-19 ENCOUNTER — Encounter (HOSPITAL_COMMUNITY)
Admission: RE | Admit: 2024-12-19 | Discharge: 2024-12-19 | Disposition: A | Source: Ambulatory Visit | Attending: Cardiology

## 2024-12-19 DIAGNOSIS — I2089 Other forms of angina pectoris: Secondary | ICD-10-CM

## 2024-12-19 DIAGNOSIS — Z48812 Encounter for surgical aftercare following surgery on the circulatory system: Secondary | ICD-10-CM | POA: Diagnosis not present

## 2024-12-19 NOTE — Progress Notes (Signed)
 Cardiac Individual Treatment Plan  Patient Details  Name: Vincent Stevens MRN: [id number] Date of Birth: 1946-09-04 Referring Provider:   Flowsheet Row INTENSIVE CARDIAC REHAB ORIENT from 10/16/2024 in [facility]  Referring Provider Jennifer Crape, MD    Initial Encounter Date:  Flowsheet Row INTENSIVE CARDIAC REHAB ORIENT from 10/16/2024 in [facility]  Date 10/16/24    Visit Diagnosis: Chronic stable angina  Patient's Home Medications on Admission: Current Medications[1]  Past Medical History: Past Medical History:  Diagnosis Date   Allergic rhinitis 06/17/2021   Allergy    Angina pectoris 11/02/2023   Angina, class III 03/01/2018   Arthralgia of right knee 08/07/2024   Atherosclerosis of native coronary artery of native heart with stable angina pectoris 08/12/2024   BPH (benign prostatic hyperplasia) 09/19/2009   Qualifier: Diagnosis of  By: Jame  MD, Maude FALCON    Chest pain of uncertain etiology 10/20/2023   Coronary artery disease involving native coronary artery of native heart with angina pectoris    Diabetes mellitus due to underlying condition with unspecified complications (HCC) 09/13/2022   Dyslipidemia 08/14/2010   Qualifier: Diagnosis of  By: Jame  MD, Maude FALCON    Dyslipidemia associated with type 2 diabetes mellitus (HCC) 08/14/2010   Lab Results      Component    Value    Date           CHOL    134    04/19/2018           HDL    43    04/19/2018           LDLCALC    70    04/19/2018           TRIG    105    04/19/2018           CHOLHDL    3.1    04/19/2018           Dysuria 08/14/2024   Essential hypertension 01/17/2009   Qualifier: Diagnosis of  By: Jame  MD, Maude FALCON    GERD (gastroesophageal reflux disease) 09/09/2022   Gout    History of colonic polyps 09/27/2008   Qualifier: Diagnosis of  By: Jame  MD, Maude FALCON   Initial colonoscopy 2003.  Hyperplastic polyps only Followup colonoscopy 2008.  Normal  10 year interval recommended    Hx of CABG 03/01/2018   Left main coronary artery disease    Melanoma (HCC)    Mixed dyslipidemia 09/21/2024   Near syncope 08/14/2024   Osteoarthritis of knee 08/07/2024   Pain in both lower extremities 08/15/2018   Prediabetes 09/13/2022   Sinus bradycardia 08/14/2024    Tobacco Use: Tobacco Use History[2]  Labs: Review Flowsheet  More data exists      Latest Ref Rng & Units 09/09/2022 03/14/2023 09/12/2023 03/01/2024 08/15/2024  Labs for ITP Cardiac and Pulmonary Rehab  Cholestrol 100 - 199 mg/dL 864  - 862  858  -  LDL (calc) 0 - 99 mg/dL 70  - 71  74  -  HDL-C >39 mg/dL 53.19  - 46.29  53  -  Trlycerides 0 - 149 mg/dL 07.9  - 35.9  68  -  Hemoglobin A1c 4.8 - 5.6 % 6.3  5.6  6.1  5.9  5.9     Capillary Blood Glucose: Lab Results  Component Value Date  GLUCAP 99 10/29/2024   GLUCAP 115 (H) 10/24/2024   GLUCAP 87 10/24/2024   GLUCAP 101 (H) 10/22/2024   GLUCAP 127 (H) 10/22/2024     Exercise Target Goals: Exercise Program Goal: Individual exercise prescription set using results from initial 6 min walk test and THRR while considering  patients activity barriers and safety.   Exercise Prescription Goal: Initial exercise prescription builds to 30-45 minutes a day of aerobic activity, 2-3 days per week.  Home exercise guidelines will be given to patient during program as part of exercise prescription that the participant will acknowledge.  Activity Barriers & Risk Stratification:  Activity Barriers & Cardiac Risk Stratification - 10/16/24 0901       Activity Barriers & Cardiac Risk Stratification   Activity Barriers Balance Concerns;Arthritis;Joint Problems;Back Problems;Deconditioning;Muscular Weakness;Chest Pain/Angina    Cardiac Risk Stratification High          6 Minute Walk:  6 Minute Walk     Row Name 10/16/24 1353         6 Minute  Walk   Phase Initial  Used Go-cart     Distance 767 feet     Walk Time 6 minutes     # of Rest Breaks 1  3:58-5:40     MPH 1.5     METS 1.52     RPE 11     Perceived Dyspnea  1     VO2 Peak 5.33     Symptoms Yes (comment)     Comments chronic pain posterior knee 5/10; chronic shoulders 5/10     Resting HR 56 bpm     Resting BP 93/51     Resting Oxygen Saturation  97 %     Exercise Oxygen Saturation  during 6 min walk 99 %     Max Ex. HR 102 bpm     Max Ex. BP 152/67     2 Minute Post BP 141/67        Oxygen Initial Assessment:   Oxygen Re-Evaluation:   Oxygen Discharge (Final Oxygen Re-Evaluation):   Initial Exercise Prescription:  Initial Exercise Prescription - 10/16/24 0800       Date of Initial Exercise RX and Referring Provider   Date 10/16/24    Referring Provider Jennifer Crape, MD    Expected Discharge Date 01/10/24      Recumbant Bike   Level 1    RPM 50    Watts 10    Minutes 15    METs 1.5      NuStep   Level 1    SPM 70    Minutes 15    METs 1.5      Prescription Details   Frequency (times per week) 3    Duration Progress to 30 minutes of continuous aerobic without signs/symptoms of physical distress      Intensity   THRR 40-80% of Max Heartrate 56-113    Ratings of Perceived Exertion 11-13    Perceived Dyspnea 0-4      Progression   Progression Continue progressive overload as per policy without signs/symptoms or physical distress.      Resistance Training   Training Prescription Yes    Weight 2 lbs    Reps 10-15          Perform Capillary Blood Glucose checks as needed.  Exercise Prescription Changes:   Exercise Prescription Changes     Row Name 10/22/24 1400 11/05/24 1013 11/19/24 1700 11/21/24 1200 12/03/24 1100  Response to Exercise   Blood Pressure (Admit) 114/60 122/70 112/62 108/68 100/64   Blood Pressure (Exercise) 120/60 -- -- -- --   Blood Pressure (Exit) 104/60 106/60 98/68 106/64 108/60   Heart Rate  (Admit) 70 bpm 65 bpm 57 bpm 76 bpm 63 bpm   Heart Rate (Exercise) 71 bpm 105 bpm 79 bpm 90 bpm 88 bpm   Heart Rate (Exit) 71 bpm 67 bpm 60 bpm 57 bpm 63 bpm   Rating of Perceived Exertion (Exercise) 11 11 11 11 11    Symptoms None None None None None   Comments Pt's first day in the CRP2 program Reviewed METs and goals Reviewed MEts and goals Reviewed home exercise Rx Reviewed METs   Duration Continue with 30 min of aerobic exercise without signs/symptoms of physical distress. Continue with 30 min of aerobic exercise without signs/symptoms of physical distress. Continue with 30 min of aerobic exercise without signs/symptoms of physical distress. Continue with 30 min of aerobic exercise without signs/symptoms of physical distress. Continue with 30 min of aerobic exercise without signs/symptoms of physical distress.   Intensity THRR unchanged THRR unchanged THRR unchanged THRR unchanged THRR unchanged     Progression   Progression Continue to progress workloads to maintain intensity without signs/symptoms of physical distress. Continue to progress workloads to maintain intensity without signs/symptoms of physical distress. Continue to progress workloads to maintain intensity without signs/symptoms of physical distress. Continue to progress workloads to maintain intensity without signs/symptoms of physical distress. Continue to progress workloads to maintain intensity without signs/symptoms of physical distress.   Average METs 2 2.1 1.9 1.95 2.1     Resistance Training   Training Prescription Yes Yes -- -- --   Weight 2 lbs 2 lbs 2 lbs No wts on wednesdays 2 lbs   Reps 10-15 10-15 10-15 -- 10-15   Time 5 Minutes 5 Minutes 5 Minutes -- 5 Minutes     Interval Training   Interval Training No No No No No     Recumbant Bike   Level 1 3 2 2 3    RPM 55 54 66 51 52   Watts 11 12 15 14 15    Minutes 15 15 15 15 15    METs 2.9 1.8 1.9 1.9 1.9     NuStep   Level 1 1 1 1 3    SPM 78 86 78 82 87    Minutes 15 15 15 15 15    METs 2.1 2.4 1.9 2 2.2     Home Exercise Plan   Plans to continue exercise at -- -- -- Home (comment) Home (comment)   Frequency -- -- -- Add 2 additional days to program exercise sessions. Add 2 additional days to program exercise sessions.   Initial Home Exercises Provided -- -- -- 11/21/24 11/21/24    Row Name 12/17/24 1600             Response to Exercise   Blood Pressure (Admit) 124/62       Blood Pressure (Exit) 98/64       Heart Rate (Admit) 67 bpm       Heart Rate (Exercise) 101 bpm       Heart Rate (Exit) 75 bpm       Rating of Perceived Exertion (Exercise) 11       Symptoms None       Comments Reviewed METs and goals       Duration Continue with 30 min of aerobic exercise without signs/symptoms of physical distress.  Intensity THRR New         Progression   Progression Continue to progress workloads to maintain intensity without signs/symptoms of physical distress.       Average METs 2.25         Resistance Training   Weight 2 lbs       Reps 10-15       Time 5 Minutes         Interval Training   Interval Training No         Recumbant Bike   Level 3       RPM 56       Watts 18       Minutes 15       METs 2.1         NuStep   Level 3       SPM 85       Minutes 15       METs 2.4         Home Exercise Plan   Plans to continue exercise at Home (comment)       Frequency Add 2 additional days to program exercise sessions.       Initial Home Exercises Provided 11/21/24          Exercise Comments:   Exercise Comments     Row Name 10/22/24 1429 11/05/24 1014 11/19/24 1710 11/21/24 1214 12/03/24 1120   Exercise Comments Pt's first day in the CRP2 program. Pt exercised without complaints and is off to a good start. Reviewed METs, Pt is making slow progress. Will increase workloads ans tolerated. Reviewed METs and goals. METs have slipped. Pt not payin close attention to his workout. Pt get lost in watching TV and does not  remained focused on worklout. will continue to encourage his to monitor is progress more closely. Reviewed home exercise Rx. Pt is doing some chair type exercises at home. Walking is difficult due to his knees. Pt will try doing chair exercises 2x/week at home 20-30 minutes. Pt was provided on hand out with links to chair exercise videos. Pt verbalized understanding of the home exercise Rx and was provided a copy. Reviewed METs, slow progress. Increased workload on Nustep.    Row Name 12/17/24 1625           Exercise Comments Reviewed METs and goals. Pt is making progress on his goals, slow progress on the METs.  Pt is however, being more consistant with his workout.          Exercise Goals and Review:   Exercise Goals     Row Name 10/16/24 0902             Exercise Goals   Increase Physical Activity Yes       Intervention Provide advice, education, support and counseling about physical activity/exercise needs.;Develop an individualized exercise prescription for aerobic and resistive training based on initial evaluation findings, risk stratification, comorbidities and participant's personal goals.       Expected Outcomes Short Term: Attend rehab on a regular basis to increase amount of physical activity.;Long Term: Add in home exercise to make exercise part of routine and to increase amount of physical activity.;Long Term: Exercising regularly at least 3-5 days a week.       Increase Strength and Stamina Yes       Intervention Provide advice, education, support and counseling about physical activity/exercise needs.;Develop an individualized exercise prescription for aerobic and resistive training based on initial evaluation findings,  risk stratification, comorbidities and participant's personal goals.       Expected Outcomes Short Term: Increase workloads from initial exercise prescription for resistance, speed, and METs.;Short Term: Perform resistance training exercises routinely during  rehab and add in resistance training at home;Long Term: Improve cardiorespiratory fitness, muscular endurance and strength as measured by increased METs and functional capacity ( )       Able to understand and use rate of perceived exertion (RPE) scale Yes       Intervention Provide education and explanation on how to use RPE scale       Expected Outcomes Short Term: Able to use RPE daily in rehab to express subjective intensity level;Long Term:  Able to use RPE to guide intensity level when exercising independently       Knowledge and understanding of Target Heart Rate Range (THRR) Yes       Intervention Provide education and explanation of THRR including how the numbers were predicted and where they are located for reference       Expected Outcomes Short Term: Able to state/look up THRR;Long Term: Able to use THRR to govern intensity when exercising independently;Short Term: Able to use daily as guideline for intensity in rehab       Understanding of Exercise Prescription Yes       Intervention Provide education, explanation, and written materials on patient's individual exercise prescription       Expected Outcomes Short Term: Able to explain program exercise prescription;Long Term: Able to explain home exercise prescription to exercise independently          Exercise Goals Re-Evaluation :  Exercise Goals Re-Evaluation     Row Name 10/22/24 1427 11/19/24 1704 12/17/24 1624         Exercise Goal Re-Evaluation   Exercise Goals Review Increase Physical Activity;Understanding of Exercise Prescription;Increase Strength and Stamina;Knowledge and understanding of Target Heart Rate Range (THRR);Able to understand and use rate of perceived exertion (RPE) scale Increase Physical Activity;Understanding of Exercise Prescription;Increase Strength and Stamina;Knowledge and understanding of Target Heart Rate Range (THRR);Able to understand and use rate of perceived exertion (RPE) scale Increase Physical  Activity;Understanding of Exercise Prescription;Increase Strength and Stamina;Knowledge and understanding of Target Heart Rate Range (THRR);Able to understand and use rate of perceived exertion (RPE) scale     Comments Pt's first day in the CRP2 program. Pt understands the exercise Rx, RPE sclae and THRR. Reviewed METs and goals with patient today. Pt voices some progress on his goals of moving around easier, having more energy, and improved strength and stamina. Pt also had goal to get back to yard work and was able to blow leaves this past weekend. Reviewed METs and goals with patient today. Pt continues to voice progress on his goals of moving around easier, having more energy, and improved strength and stamina. Pt also had goal to get back to yard work and has been able to get all of his leaves raked up.     Expected Outcomes Will continue to monitor the patient and progress exercise workloads as tolerated. Will continue to monitor the patient and progress exercise workloads as tolerated. Will continue to monitor the patient and progress exercise workloads as tolerated.        Discharge Exercise Prescription (Final Exercise Prescription Changes):  Exercise Prescription Changes - 12/17/24 1600       Response to Exercise   Blood Pressure (Admit) 124/62    Blood Pressure (Exit) 98/64    Heart Rate (Admit) 67 bpm  Heart Rate (Exercise) 101 bpm    Heart Rate (Exit) 75 bpm    Rating of Perceived Exertion (Exercise) 11    Symptoms None    Comments Reviewed METs and goals    Duration Continue with 30 min of aerobic exercise without signs/symptoms of physical distress.    Intensity THRR New      Progression   Progression Continue to progress workloads to maintain intensity without signs/symptoms of physical distress.    Average METs 2.25      Resistance Training   Weight 2 lbs    Reps 10-15    Time 5 Minutes      Interval Training   Interval Training No      Recumbant Bike   Level 3     RPM 56    Watts 18    Minutes 15    METs 2.1      NuStep   Level 3    SPM 85    Minutes 15    METs 2.4      Home Exercise Plan   Plans to continue exercise at Home (comment)    Frequency Add 2 additional days to program exercise sessions.    Initial Home Exercises Provided 11/21/24          Nutrition:  Target Goals: Understanding of nutrition guidelines, daily intake of sodium 1500mg , cholesterol 200mg , calories 30% from fat and 7% or less from saturated fats, daily to have 5 or more servings of fruits and vegetables.  Biometrics:  Pre Biometrics - 10/16/24 0834       Pre Biometrics   Waist Circumference 45 inches    Hip Circumference 43 inches    Waist to Hip Ratio 1.05 %    Triceps Skinfold 15 mm    % Body Fat 31.2 %    Grip Strength 15 kg    Flexibility --   pt c/o back issues not performed   Single Leg Stand 1.2 seconds           Nutrition Therapy Plan and Nutrition Goals:  Nutrition Therapy & Goals - 10/22/24 0938       Nutrition Therapy   Diet Heart Healthy    Drug/Food Interactions Statins/Certain Fruits      Personal Nutrition Goals   Nutrition Goal Patient to identify strategies for reducing cardiovascular risk by attending the [employer] education and nutrition series weekly.    Personal Goal #2 Patient to improve diet quality by using the plate method as a guide for meal planning to include lean protein/plant protein, fruits, vegetables, whole grains, nonfat dairy as part of a well-balanced diet.    Personal Goal #3 Patient to identify strategies for blood sugar control with goal A1c <7%.    Comments Patient with medical history significant for CAD with chronic stable angina, HTN, and DM2. Most recent A1c 5.9% on 08/15/24. Pt reports making recent dietary changes such as drinking primarly water. Identifies needs to make healthier food choices such as swapping out typical biscuit at breakfast for whole wheat toast. RD provided additional information  regarding heart healthy diet. Discussed importance of increasing fiber intake to help lower risk factors for heart disease and diabetes. Patient will benefit from participation in intensive cardiac rehab for nutrition education, exercise, and lifestyle modification.      Intervention Plan   Intervention Prescribe, educate and counsel regarding individualized specific dietary modifications aiming towards targeted core components such as weight, hypertension, lipid management, diabetes, heart failure and other comorbidities.;Nutrition  handout(s) given to patient.   Handout: [employer] Eating for a Healthy Heart   Expected Outcomes Short Term Goal: Understand basic principles of dietary content, such as calories, fat, sodium, cholesterol and nutrients.;Long Term Goal: Adherence to prescribed nutrition plan.          Nutrition Assessments:  MEDIFICTS Score Key: >=70 Need to make dietary changes  40-70 Heart Healthy Diet <= 40 Therapeutic Level Cholesterol Diet   Flowsheet Row INTENSIVE CARDIAC REHAB from 10/22/2024 in [facility]  Picture Your Plate Total Score on Admission 55   Picture Your Plate Scores: <59 Unhealthy dietary pattern with much room for improvement. 41-50 Dietary pattern unlikely to meet recommendations for good health and room for improvement. 51-60 More healthful dietary pattern, with some room for improvement.  >60 Healthy dietary pattern, although there may be some specific behaviors that could be improved.    Nutrition Goals Re-Evaluation:  Nutrition Goals Re-Evaluation     Row Name 11/14/24 1118 12/12/24 1030           Goals   Current Weight 194 lb 0.1 oz (88 kg) 197 lb 15.6 oz (89.8 kg)      Nutrition Goal Patient to identify strategies for reducing cardiovascular risk by attending the [employer] education and nutrition series weekly. Patient to identify strategies for reducing cardiovascular risk by  attending the [employer] education and nutrition series weekly.      Comment -- Recent wt gain of 1.8 kg (2%) over past month.      Expected Outcome Goals in action. Patient with medical history significant for CAD with chronic stable angina, HTN, and DM2. Recent wt gain of 3 lb noted over past month. Pt reports making changes to diet such as restricting sugary snacks and opting for sauteed veggies in place of fries/chips. Typically eats high protein Cheerios with banana, 2% milk and orange juice for breakfast. Reports eating hot dog with sauteed onions for dinner last night. Reports eating ~ 2 meals daily. Pt states blood glucose remains in appropriate range. RD provided suggestions for increasing protein, fiber at breakfast. Also encouraged pt to include small meal between lunch and dinner. Provided suggestions such as tuna salad with low sodium, whole grain crackers or apple slices with small amount of nut butter. Also encouraged pt to opt for skinless poultry/fish for evening meal. Patient will benefit from ongoing participation in intensive cardiac rehab for nutrition education, exercise, and lifestyle modification. Goals in action. Patient with medical history significant for CAD with chronic stable angina, HTN, and DM2. Non-significant wt gain noted over past month. Pt states blood glucose remains well-controlled with blood glucose reading of 113 this am. Reports eating 50% less fast food, restricting sugary beverages, opting for whole grains such as whole wheat bread and switching out 2% milk for skim milk with whole grain cereal. Pt attending [facility] education and cooking classes. RD praised pt for significant improvements made since starting cardiac rehab. Pt plans to continue to work towards limiting fast food intake. Patient will benefit from ongoing participation in intensive cardiac rehab for nutrition education, exercise, and lifestyle modification.        Personal Goal #2 Re-Evaluation   Personal  Goal #2 Patient to improve diet quality by using the plate method as a guide for meal planning to include lean protein/plant protein, fruits, vegetables, whole grains, nonfat dairy as part of a well-balanced diet. Patient to improve diet quality by using the plate method  as a guide for meal planning to include lean protein/plant protein, fruits, vegetables, whole grains, nonfat dairy as part of a well-balanced diet.        Personal Goal #3 Re-Evaluation   Personal Goal #3 Patient to identify strategies for blood sugar control with goal A1c <7%. Patient to identify strategies for blood sugar control with goal A1c <7%.         Nutrition Goals Re-Evaluation:  Nutrition Goals Re-Evaluation     Row Name 11/14/24 1118 12/12/24 1030           Goals   Current Weight 194 lb 0.1 oz (88 kg) 197 lb 15.6 oz (89.8 kg)      Nutrition Goal Patient to identify strategies for reducing cardiovascular risk by attending the [facility] education and nutrition series weekly. Patient to identify strategies for reducing cardiovascular risk by attending the [facility] education and nutrition series weekly.      Comment -- Recent wt gain of 1.8 kg (2%) over past month.      Expected Outcome Goals in action. Patient with medical history significant for CAD with chronic stable angina, HTN, and DM2. Recent wt gain of 3 lb noted over past month. Pt reports making changes to diet such as restricting sugary snacks and opting for sauteed veggies in place of fries/chips. Typically eats high protein Cheerios with banana, 2% milk and orange juice for breakfast. Reports eating hot dog with sauteed onions for dinner last night. Reports eating ~ 2 meals daily. Pt states blood glucose remains in appropriate range. RD provided suggestions for increasing protein, fiber at breakfast. Also encouraged pt to include small meal between lunch and dinner. Provided suggestions such as tuna salad with low sodium, whole grain crackers or apple slices  with small amount of nut butter. Also encouraged pt to opt for skinless poultry/fish for evening meal. Patient will benefit from ongoing participation in intensive cardiac rehab for nutrition education, exercise, and lifestyle modification. Goals in action. Patient with medical history significant for CAD with chronic stable angina, HTN, and DM2. Non-significant wt gain noted over past month. Pt states blood glucose remains well-controlled with blood glucose reading of 113 this am. Reports eating 50% less fast food, restricting sugary beverages, opting for whole grains such as whole wheat bread and switching out 2% milk for skim milk with whole grain cereal. Pt attending [facility] education and cooking classes. RD praised pt for significant improvements made since starting cardiac rehab. Pt plans to continue to work towards limiting fast food intake. Patient will benefit from ongoing participation in intensive cardiac rehab for nutrition education, exercise, and lifestyle modification.        Personal Goal #2 Re-Evaluation   Personal Goal #2 Patient to improve diet quality by using the plate method as a guide for meal planning to include lean protein/plant protein, fruits, vegetables, whole grains, nonfat dairy as part of a well-balanced diet. Patient to improve diet quality by using the plate method as a guide for meal planning to include lean protein/plant protein, fruits, vegetables, whole grains, nonfat dairy as part of a well-balanced diet.        Personal Goal #3 Re-Evaluation   Personal Goal #3 Patient to identify strategies for blood sugar control with goal A1c <7%. Patient to identify strategies for blood sugar control with goal A1c <7%.         Nutrition Goals Discharge (Final Nutrition Goals Re-Evaluation):  Nutrition Goals Re-Evaluation - 12/12/24 1030  Goals   Current Weight 197 lb 15.6 oz (89.8 kg)    Nutrition Goal Patient to identify strategies for reducing cardiovascular risk by  attending the Pritikin education and nutrition series weekly.    Comment Recent wt gain of 1.8 kg (2%) over past month.    Expected Outcome Goals in action. Patient with medical history significant for CAD with chronic stable angina, HTN, and DM2. Non-significant wt gain noted over past month. Pt states blood glucose remains well-controlled with blood glucose reading of 113 this am. Reports eating 50% less fast food, restricting sugary beverages, opting for whole grains such as whole wheat bread and switching out 2% milk for skim milk with whole grain cereal. Pt attending Pritikin education and cooking classes. RD praised pt for significant improvements made since starting cardiac rehab. Pt plans to continue to work towards limiting fast food intake. Patient will benefit from ongoing participation in intensive cardiac rehab for nutrition education, exercise, and lifestyle modification.      Personal Goal #2 Re-Evaluation   Personal Goal #2 Patient to improve diet quality by using the plate method as a guide for meal planning to include lean protein/plant protein, fruits, vegetables, whole grains, nonfat dairy as part of a well-balanced diet.      Personal Goal #3 Re-Evaluation   Personal Goal #3 Patient to identify strategies for blood sugar control with goal A1c <7%.          Psychosocial: Target Goals: Acknowledge presence or absence of significant depression and/or stress, maximize coping skills, provide positive support system. Participant is able to verbalize types and ability to use techniques and skills needed for reducing stress and depression.  Initial Review & Psychosocial Screening:  Initial Psych Review & Screening - 10/16/24 0857       Initial Review   Current issues with Current Sleep Concerns;Current Anxiety/Panic      Family Dynamics   Good Support System? Yes   Pt has sposue and daughter for support   Comments Pt does voice low level anxiety in regards to his health issues.       Barriers   Psychosocial barriers to participate in program The patient should benefit from training in stress management and relaxation.      Screening Interventions   Interventions Encouraged to exercise    Expected Outcomes Short Term goal: Utilizing psychosocial counselor, staff and physician to assist with identification of specific Stressors or current issues interfering with healing process. Setting desired goal for each stressor or current issue identified.;Long Term Goal: Stressors or current issues are controlled or eliminated.;Short Term goal: Identification and review with participant of any Quality of Life or Depression concerns found by scoring the questionnaire.;Long Term goal: The participant improves quality of Life and PHQ9 Scores as seen by post scores and/or verbalization of changes          Quality of Life Scores:  Quality of Life - 10/16/24 1349       Quality of Life   Select Quality of Life      Quality of Life Scores   Health/Function Pre 26.2 %    Socioeconomic Pre 26.5 %    Psych/Spiritual Pre 28.07 %    Family Pre 30 %    GLOBAL Pre 27.19 %         Scores of 19 and below usually indicate a poorer quality of life in these areas.  A difference of  2-3 points is a clinically meaningful difference.  A difference of  2-3 points in the total score of the Quality of Life Index has been associated with significant improvement in overall quality of life, self-image, physical symptoms, and general health in studies assessing change in quality of life.  PHQ-9: Review Flowsheet  More data exists      10/16/2024 09/27/2024 09/13/2024 08/23/2024 04/10/2024  Depression screen PHQ 2/9  Decreased Interest 1 0 0 0 0  Down, Depressed, Hopeless 0 0 0 0 0  PHQ - 2 Score 1 0 0 0 0  Altered sleeping 1 - - - -  Tired, decreased energy 1 - - - -  Change in appetite 1 - - - -  Feeling bad or failure about yourself  0 - - - -  Trouble concentrating 1 - - - -  Moving  slowly or fidgety/restless 1 - - - -  Suicidal thoughts 0 - - - -  PHQ-9 Score 6 - - - -   Interpretation of Total Score  Total Score Depression Severity:  1-4 = Minimal depression, 5-9 = Mild depression, 10-14 = Moderate depression, 15-19 = Moderately severe depression, 20-27 = Severe depression   Psychosocial Evaluation and Intervention:   Psychosocial Re-Evaluation:  Psychosocial Re-Evaluation     Row Name 10/22/24 1637 11/24/24 1151 12/19/24 1420         Psychosocial Re-Evaluation   Current issues with Current Anxiety/Panic;Current Sleep Concerns None Identified None Identified     Comments Carrick started cardiac rehab on 10/22/24. Reviewed PHQ9. Kristian says he has had some improvement in his energy level since his Ranexa  has been discontinued. Kasra says he has to get up frequently during the night and therefore affects his sleep. Jeoffrey says he has an appointment with the urlogist and will discuss in the near future. Dontell has not voiced any increased concersn cardiac rehab Jekhi has not voiced any increased concersn cardiac rehab     Expected Outcomes Lynx will have controlled or decreased anxitey stressors upon completion of cardiac rehab. Dymir will have controlled or decreased anxitey stressors upon completion of cardiac rehab. Nolan will have controlled or decreased anxitey stressors upon completion of cardiac rehab.     Interventions Encouraged to attend Cardiac Rehabilitation for the exercise;Encouraged to attend Pulmonary Rehabilitation for the exercise;Stress management education Encouraged to attend Cardiac Rehabilitation for the exercise;Encouraged to attend Pulmonary Rehabilitation for the exercise;Stress management education Encouraged to attend Cardiac Rehabilitation for the exercise;Encouraged to attend Pulmonary Rehabilitation for the exercise;Stress management education     Continue Psychosocial Services  No Follow up required No Follow up required No  Follow up required        Psychosocial Discharge (Final Psychosocial Re-Evaluation):  Psychosocial Re-Evaluation - 12/19/24 1420       Psychosocial Re-Evaluation   Current issues with None Identified    Comments Benicio has not voiced any increased concersn cardiac rehab    Expected Outcomes Mercedes will have controlled or decreased anxitey stressors upon completion of cardiac rehab.    Interventions Encouraged to attend Cardiac Rehabilitation for the exercise;Encouraged to attend Pulmonary Rehabilitation for the exercise;Stress management education    Continue Psychosocial Services  No Follow up required          Vocational Rehabilitation: Provide vocational rehab assistance to qualifying candidates.   Vocational Rehab Evaluation & Intervention:  Vocational Rehab - 10/16/24 0856       Initial Vocational Rehab Evaluation & Intervention   Assessment shows need for Vocational Rehabilitation No  Education: Education Goals: Education classes will be provided on a weekly basis, covering required topics. Participant will state understanding/return demonstration of topics presented.    Education     Row Name 10/22/24 0800     Education   Cardiac Education Topics Pritikin   Glass Blower/designer Nutrition   Nutrition Workshop Targeting Your Nutrition Priorities   Instruction Review Code 1- Verbalizes Understanding   Class Start Time 0815   Class Stop Time 0901   Class Time Calculation (min) 46 min    Row Name 10/24/24 0800     Education   Cardiac Education Topics Pritikin   Secondary School Teacher School   Educator Dietitian   Weekly Topic One-Pot Wonders   Instruction Review Code 1- Verbalizes Understanding   Class Start Time [phone]   Class Stop Time 0858   Class Time Calculation (min) 44 min    Row Name 10/29/24 0800     Education   Cardiac Education Topics Pritikin   Select Workshops      Workshops   Educator Exercise Physiologist   Select Psychosocial   Psychosocial Workshop Focused Goals, Sustainable Changes   Instruction Review Code 1- Verbalizes Understanding   Class Start Time 0815   Class Stop Time 0850   Class Time Calculation (min) 35 min    Row Name 10/31/24 0900     Education   Cardiac Education Topics Pritikin   Orthoptist   Educator Dietitian   Weekly Topic Comforting Weekend Breakfasts   Instruction Review Code 1- Verbalizes Understanding   Class Start Time 0815   Class Stop Time 0857   Class Time Calculation (min) 42 min    Row Name 11/02/24 0800     Education   Cardiac Education Topics Pritikin   Select Core Videos     Core Videos   Educator Dietitian   Select Nutrition   Nutrition Dining Out - Part 1   Instruction Review Code 1- Verbalizes Understanding   Class Start Time [phone]   Class Stop Time 0853   Class Time Calculation (min) 37 min    Row Name 11/05/24 0800     Education   Cardiac Education Topics Pritikin   Select Core Videos     Core Videos   Educator Exercise Physiologist   Select Exercise Education   Exercise Education Biomechanial Limitations   Instruction Review Code 1- Verbalizes Understanding   Class Start Time 0815   Class Stop Time 0855   Class Time Calculation (min) 40 min    Row Name 11/07/24 0800     Education   Cardiac Education Topics Pritikin   Secondary School Teacher School   Educator Dietitian   Weekly Topic Fast Evening Meals   Instruction Review Code 1- Verbalizes Understanding   Class Start Time 0815   Class Stop Time 0902   Class Time Calculation (min) 47 min    Row Name 11/09/24 0800     Education   Cardiac Education Topics Pritikin   Select Core Videos     Core Videos   Educator Dietitian   Select Nutrition   Nutrition Vitamins and Minerals   Instruction Review Code 1- Verbalizes Understanding   Class Start Time 0815   Class Stop Time 0856    Class Time Calculation (min) 41 min    Row Name 11/12/24 0700  Education   Cardiac Education Topics Pritikin   Psychologist, Forensic Exercise Education   Exercise Education Improving Performance   Instruction Review Code 1- Verbalizes Understanding   Class Start Time 0820   Class Stop Time [phone]   Class Time Calculation (min) 35 min    Row Name 11/14/24 0800     Education   Cardiac Education Topics Pritikin   Customer Service Manager   Weekly Topic International Cuisine- Spotlight on the [facility] Zones   Instruction Review Code 1- Verbalizes Understanding   Class Start Time 0815   Class Stop Time 0903   Class Time Calculation (min) 48 min    Row Name 11/16/24 0700     Education   Cardiac Education Topics Pritikin   Select Workshops     Core Videos   Educator --   Select --   Nutrition --   Instruction Review Code --     Workshops   Geophysical Data Processor Nutrition   Nutrition Workshop Fueling a Forensic Psychologist   Instruction Review Code 1- Verbalizes Understanding   Class Start Time 0815   Class Stop Time 0901   Class Time Calculation (min) 46 min    Row Name 11/19/24 0900     Education   Cardiac Education Topics Pritikin   Select Workshops     Workshops   Educator Exercise Physiologist   Select Psychosocial   Psychosocial Workshop Healthy Sleep for a Healthy Heart   Instruction Review Code 1- Verbalizes Understanding   Class Start Time 0815   Class Stop Time 0856   Class Time Calculation (min) 41 min    Row Name 11/21/24 0800     Education   Cardiac Education Topics Pritikin   Secondary School Teacher School   Educator Dietitian   Weekly Topic Simple Sides and Sauces   Instruction Review Code 1- Verbalizes Understanding   Class Start Time 0815   Class Stop Time 0856   Class Time Calculation (min) 41 min    Row Name 11/26/24 0800      Education   Cardiac Education Topics Pritikin   Select Core Videos     Core Videos   Educator Nurse   Select Psychosocial   Psychosocial How Our Thoughts Can Heal Our Hearts   Instruction Review Code 1- Verbalizes Understanding   Class Start Time [phone]   Class Stop Time 0852   Class Time Calculation (min) 33 min    Row Name 11/28/24 0700     Education   Cardiac Education Topics Pritikin   Secondary School Teacher School   Educator Dietitian   Weekly Topic Powerhouse Plant-Based Proteins   Instruction Review Code 1- Verbalizes Understanding   Class Start Time [phone]   Class Stop Time 0857   Class Time Calculation (min) 41 min    Row Name 11/30/24 0800     Education   Cardiac Education Topics Pritikin   Select Core Videos     Core Videos   Educator Dietitian   Select Nutrition   Nutrition Facts on Fat   Instruction Review Code 1- Verbalizes Understanding   Class Start Time 0815   Class Stop Time 0855   Class Time Calculation (min) 40 min    Row Name 12/03/24 0900     Education  Cardiac Education Topics Pritikin   Geographical Information Systems Officer Psychosocial   Psychosocial Workshop From Head to Heart: The Power of a Healthy Outlook   Instruction Review Code 1- Verbalizes Understanding   Class Start Time [phone]   Class Stop Time 0850   Class Time Calculation (min) 38 min    Row Name 12/05/24 0800     Education   Cardiac Education Topics Pritikin   Secondary School Teacher School   Educator Dietitian   Weekly Topic Tasty Appetizers and Snacks   Instruction Review Code 1- Verbalizes Understanding   Class Start Time 0815   Class Stop Time 0856   Class Time Calculation (min) 41 min    Row Name 12/07/24 0700     Education   Cardiac Education Topics Pritikin   Select Core Videos     Core Videos   Educator Dietitian   Select Nutrition   Nutrition Other  Label reading video   Instruction Review  Code 1- Verbalizes Understanding   Class Start Time 0815   Class Stop Time 0850   Class Time Calculation (min) 35 min    Row Name 12/10/24 0800     Education   Cardiac Education Topics Pritikin   [employer]     Workshops   Educator Dietitian   Select Nutrition   Nutrition Workshop Label Reading   Instruction Review Code 1- Verbalizes Understanding   Class Start Time 0816   Class Stop Time 0903   Class Time Calculation (min) 47 min    Row Name 12/12/24 0700     Education   Cardiac Education Topics Pritikin   Secondary School Teacher School   Educator Dietitian   Weekly Topic Adding Flavor - Sodium-Free   Instruction Review Code 1- Verbalizes Understanding   Class Start Time [phone]   Class Stop Time 0850   Class Time Calculation (min) 36 min    Row Name 12/14/24 0700     Education   Cardiac Education Topics Pritikin   Select Core Videos     Core Videos   Educator Exercise Physiologist   Select Psychosocial   Psychosocial Healthy Minds, Bodies, Hearts   Instruction Review Code 1- Verbalizes Understanding   Class Start Time 0810   Class Stop Time 0844   Class Time Calculation (min) 34 min    Row Name 12/17/24 0700     Education   Cardiac Education Topics Pritikin   Writer General Education   General Education Heart Disease Risk Reduction   Instruction Review Code 1- Verbalizes Understanding   Class Start Time 0820   Class Stop Time [phone]   Class Time Calculation (min) 32 min    Row Name 12/19/24 0700     Education   Cardiac Education Topics Pritikin   Secondary School Teacher School   Educator Dietitian   Weekly Topic Fast and Healthy Breakfasts   Instruction Review Code 1- Verbalizes Understanding   Class Start Time 0815   Class Stop Time 0853   Class Time Calculation (min) 38 min    Row Name 12/21/24 0800     Education   Cardiac Education Topics Pritikin   Customer Service Manager Exercise  Exercise Workshop Location Manager and Fall Prevention   Instruction Review Code 1- Verbalizes Understanding   Class Start Time [phone]   Class Stop Time 0857   Class Time Calculation (min) 43 min      Core Videos: Exercise    Move It!  Clinical staff conducted group or individual video education with verbal and written material and guidebook.  Patient learns the recommended Pritikin exercise program. Exercise with the goal of living a long, healthy life. Some of the health benefits of exercise include controlled diabetes, healthier blood pressure levels, improved cholesterol levels, improved heart and lung capacity, improved sleep, and better body composition. Everyone should speak with their doctor before starting or changing an exercise routine.  Biomechanical Limitations Clinical staff conducted group or individual video education with verbal and written material and guidebook.  Patient learns how biomechanical limitations can impact exercise and how we can mitigate and possibly overcome limitations to have an impactful and balanced exercise routine.  Body Composition Clinical staff conducted group or individual video education with verbal and written material and guidebook.  Patient learns that body composition (ratio of muscle mass to fat mass) is a key component to assessing overall fitness, rather than body weight alone. Increased fat mass, especially visceral belly fat, can put us  at increased risk for metabolic syndrome, type 2 diabetes, heart disease, and even death. It is recommended to combine diet and exercise (cardiovascular and resistance training) to improve your body composition. Seek guidance from your physician and exercise physiologist before implementing an exercise routine.  Exercise Action Plan Clinical staff conducted group or individual video education with verbal and written material and  guidebook.  Patient learns the recommended strategies to achieve and enjoy long-term exercise adherence, including variety, self-motivation, self-efficacy, and positive decision making. Benefits of exercise include fitness, good health, weight management, more energy, better sleep, less stress, and overall well-being.  Medical   Heart Disease Risk Reduction Clinical staff conducted group or individual video education with verbal and written material and guidebook.  Patient learns our heart is our most vital organ as it circulates oxygen, nutrients, white blood cells, and hormones throughout the entire body, and carries waste away. Data supports a plant-based eating plan like the Pritikin Program for its effectiveness in slowing progression of and reversing heart disease. The video provides a number of recommendations to address heart disease.   Metabolic Syndrome and Belly Fat  Clinical staff conducted group or individual video education with verbal and written material and guidebook.  Patient learns what metabolic syndrome is, how it leads to heart disease, and how one can reverse it and keep it from coming back. You have metabolic syndrome if you have 3 of the following 5 criteria: abdominal obesity, high blood pressure, high triglycerides, low HDL cholesterol, and high blood sugar.  Hypertension and Heart Disease Clinical staff conducted group or individual video education with verbal and written material and guidebook.  Patient learns that high blood pressure, or hypertension, is very common in the [place] . Hypertension is largely due to excessive salt intake, but other important risk factors include being overweight, physical inactivity, drinking too much alcohol, smoking, and not eating enough potassium from fruits and vegetables. High blood pressure is a leading risk factor for heart attack, stroke, congestive heart failure, dementia, kidney failure, and premature death. Long-term effects  of excessive salt intake include stiffening of the arteries and thickening of heart muscle and organ damage. Recommendations include ways to reduce hypertension and the risk  of heart disease.  Diseases of Our Time - Focusing on Diabetes Clinical staff conducted group or individual video education with verbal and written material and guidebook.  Patient learns why the best way to stop diseases of our time is prevention, through food and other lifestyle changes. Medicine (such as prescription pills and surgeries) is often only a Band-Aid on the problem, not a long-term solution. Most common diseases of our time include obesity, type 2 diabetes, hypertension, heart disease, and cancer. The Pritikin Program is recommended and has been proven to help reduce, reverse, and/or prevent the damaging effects of metabolic syndrome.  Nutrition   Overview of the Pritikin Eating Plan  Clinical staff conducted group or individual video education with verbal and written material and guidebook.  Patient learns about the Pritikin Eating Plan for disease risk reduction. The Pritikin Eating Plan emphasizes a wide variety of unrefined, minimally-processed carbohydrates, like fruits, vegetables, whole grains, and legumes. Go, Caution, and Stop food choices are explained. Plant-based and lean animal proteins are emphasized. Rationale provided for low sodium intake for blood pressure control, low added sugars for blood sugar stabilization, and low added fats and oils for coronary artery disease risk reduction and weight management.  Calorie Density  Clinical staff conducted group or individual video education with verbal and written material and guidebook.  Patient learns about calorie density and how it impacts the Pritikin Eating Plan. Knowing the characteristics of the food you choose will help you decide whether those foods will lead to weight gain or weight loss, and whether you want to consume more or less of them. Weight  loss is usually a side effect of the Pritikin Eating Plan because of its focus on low calorie-dense foods.  Label Reading  Clinical staff conducted group or individual video education with verbal and written material and guidebook.  Patient learns about the Pritikin recommended label reading guidelines and corresponding recommendations regarding calorie density, added sugars, sodium content, and whole grains.  Dining Out - Part 1  Clinical staff conducted group or individual video education with verbal and written material and guidebook.  Patient learns that restaurant meals can be sabotaging because they can be so high in calories, fat, sodium, and/or sugar. Patient learns recommended strategies on how to positively address this and avoid unhealthy pitfalls.  Facts on Fats  Clinical staff conducted group or individual video education with verbal and written material and guidebook.  Patient learns that lifestyle modifications can be just as effective, if not more so, as many medications for lowering your risk of heart disease. A Pritikin lifestyle can help to reduce your risk of inflammation and atherosclerosis (cholesterol build-up, or plaque, in the artery walls). Lifestyle interventions such as dietary choices and physical activity address the cause of atherosclerosis. A review of the types of fats and their impact on blood cholesterol levels, along with dietary recommendations to reduce fat intake is also included.  Nutrition Action Plan  Clinical staff conducted group or individual video education with verbal and written material and guidebook.  Patient learns how to incorporate Pritikin recommendations into their lifestyle. Recommendations include planning and keeping personal health goals in mind as an important part of their success.  Healthy Mind-Set    Healthy Minds, Bodies, Hearts  Clinical staff conducted group or individual video education with verbal and written material and  guidebook.  Patient learns how to identify when they are stressed. Video will discuss the impact of that stress, as well as the many benefits of  stress management. Patient will also be introduced to stress management techniques. The way we think, act, and feel has an impact on our hearts.  How Our Thoughts Can Heal Our Hearts  Clinical staff conducted group or individual video education with verbal and written material and guidebook.  Patient learns that negative thoughts can cause depression and anxiety. This can result in negative lifestyle behavior and serious health problems. Cognitive behavioral therapy is an effective method to help control our thoughts in order to change and improve our emotional outlook.  Additional Videos:  Exercise    Improving Performance  Clinical staff conducted group or individual video education with verbal and written material and guidebook.  Patient learns to use a non-linear approach by alternating intensity levels and lengths of time spent exercising to help burn more calories and lose more body fat. Cardiovascular exercise helps improve heart health, metabolism, hormonal balance, blood sugar control, and recovery from fatigue. Resistance training improves strength, endurance, balance, coordination, reaction time, metabolism, and muscle mass. Flexibility exercise improves circulation, posture, and balance. Seek guidance from your physician and exercise physiologist before implementing an exercise routine and learn your capabilities and proper form for all exercise.  Introduction to Yoga  Clinical staff conducted group or individual video education with verbal and written material and guidebook.  Patient learns about yoga, a discipline of the coming together of mind, breath, and body. The benefits of yoga include improved flexibility, improved range of motion, better posture and core strength, increased lung function, weight loss, and positive self-image. Yogas  heart health benefits include lowered blood pressure, healthier heart rate, decreased cholesterol and triglyceride levels, improved immune function, and reduced stress. Seek guidance from your physician and exercise physiologist before implementing an exercise routine and learn your capabilities and proper form for all exercise.  Medical   Aging: Enhancing Your Quality of Life  Clinical staff conducted group or individual video education with verbal and written material and guidebook.  Patient learns key strategies and recommendations to stay in good physical health and enhance quality of life, such as prevention strategies, having an advocate, securing a Health Care Proxy and Power of Attorney, and keeping a list of medications and system for tracking them. It also discusses how to avoid risk for bone loss.  Biology of Weight Control  Clinical staff conducted group or individual video education with verbal and written material and guidebook.  Patient learns that weight gain occurs because we consume more calories than we burn (eating more, moving less). Even if your body weight is normal, you may have higher ratios of fat compared to muscle mass. Too much body fat puts you at increased risk for cardiovascular disease, heart attack, stroke, type 2 diabetes, and obesity-related cancers. In addition to exercise, following the Pritikin Eating Plan can help reduce your risk.  Decoding Lab Results  Clinical staff conducted group or individual video education with verbal and written material and guidebook.  Patient learns that lab test reflects one measurement whose values change over time and are influenced by many factors, including medication, stress, sleep, exercise, food, hydration, pre-existing medical conditions, and more. It is recommended to use the knowledge from this video to become more involved with your lab results and evaluate your numbers to speak with your doctor.   Diseases of Our Time -  Overview  Clinical staff conducted group or individual video education with verbal and written material and guidebook.  Patient learns that according to the CDC, 50% to 70% of  chronic diseases (such as obesity, type 2 diabetes, elevated lipids, hypertension, and heart disease) are avoidable through lifestyle improvements including healthier food choices, listening to satiety cues, and increased physical activity.  Sleep Disorders Clinical staff conducted group or individual video education with verbal and written material and guidebook.  Patient learns how good quality and duration of sleep are important to overall health and well-being. Patient also learns about sleep disorders and how they impact health along with recommendations to address them, including discussing with a physician.  Nutrition  Dining Out - Part 2 Clinical staff conducted group or individual video education with verbal and written material and guidebook.  Patient learns how to plan ahead and communicate in order to maximize their dining experience in a healthy and nutritious manner. Included are recommended food choices based on the type of restaurant the patient is visiting.   Fueling a Banker conducted group or individual video education with verbal and written material and guidebook.  There is a strong connection between our food choices and our health. Diseases like obesity and type 2 diabetes are very prevalent and are in large-part due to lifestyle choices. The Pritikin Eating Plan provides plenty of food and hunger-curbing satisfaction. It is easy to follow, affordable, and helps reduce health risks.  Menu Workshop  Clinical staff conducted group or individual video education with verbal and written material and guidebook.  Patient learns that restaurant meals can sabotage health goals because they are often packed with calories, fat, sodium, and sugar. Recommendations include strategies to plan  ahead and to communicate with the manager, chef, or server to help order a healthier meal.  Planning Your Eating Strategy  Clinical staff conducted group or individual video education with verbal and written material and guidebook.  Patient learns about the Pritikin Eating Plan and its benefit of reducing the risk of disease. The Pritikin Eating Plan does not focus on calories. Instead, it emphasizes high-quality, nutrient-rich foods. By knowing the characteristics of the foods, we choose, we can determine their calorie density and make informed decisions.  Targeting Your Nutrition Priorities  Clinical staff conducted group or individual video education with verbal and written material and guidebook.  Patient learns that lifestyle habits have a tremendous impact on disease risk and progression. This video provides eating and physical activity recommendations based on your personal health goals, such as reducing LDL cholesterol, losing weight, preventing or controlling type 2 diabetes, and reducing high blood pressure.  Vitamins and Minerals  Clinical staff conducted group or individual video education with verbal and written material and guidebook.  Patient learns different ways to obtain key vitamins and minerals, including through a recommended healthy diet. It is important to discuss all supplements you take with your doctor.   Healthy Mind-Set    Smoking Cessation  Clinical staff conducted group or individual video education with verbal and written material and guidebook.  Patient learns that cigarette smoking and tobacco addiction pose a serious health risk which affects millions of people. Stopping smoking will significantly reduce the risk of heart disease, lung disease, and many forms of cancer. Recommended strategies for quitting are covered, including working with your doctor to develop a successful plan.  Culinary   Becoming a Set Designer conducted group or  individual video education with verbal and written material and guidebook.  Patient learns that cooking at home can be healthy, cost-effective, quick, and puts them in control. Keys to cooking healthy recipes will include  looking at your recipe, assessing your equipment needs, planning ahead, making it simple, choosing cost-effective seasonal ingredients, and limiting the use of added fats, salts, and sugars.  Cooking - Breakfast and Snacks  Clinical staff conducted group or individual video education with verbal and written material and guidebook.  Patient learns how important breakfast is to satiety and nutrition through the entire day. Recommendations include key foods to eat during breakfast to help stabilize blood sugar levels and to prevent overeating at meals later in the day. Planning ahead is also a key component.  Cooking - Educational Psychologist conducted group or individual video education with verbal and written material and guidebook.  Patient learns eating strategies to improve overall health, including an approach to cook more at home. Recommendations include thinking of animal protein as a side on your plate rather than center stage and focusing instead on lower calorie dense options like vegetables, fruits, whole grains, and plant-based proteins, such as beans. Making sauces in large quantities to freeze for later and leaving the skin on your vegetables are also recommended to maximize your experience.  Cooking - Healthy Salads and Dressing Clinical staff conducted group or individual video education with verbal and written material and guidebook.  Patient learns that vegetables, fruits, whole grains, and legumes are the foundations of the Pritikin Eating Plan. Recommendations include how to incorporate each of these in flavorful and healthy salads, and how to create homemade salad dressings. Proper handling of ingredients is also covered. Cooking - Soups and [employer] - Soups and Desserts Clinical staff conducted group or individual video education with verbal and written material and guidebook.  Patient learns that Pritikin soups and desserts make for easy, nutritious, and delicious snacks and meal components that are low in sodium, fat, sugar, and calorie density, while high in vitamins, minerals, and filling fiber. Recommendations include simple and healthy ideas for soups and desserts.   Overview     The Pritikin Solution Program Overview Clinical staff conducted group or individual video education with verbal and written material and guidebook.  Patient learns that the results of the Pritikin Program have been documented in more than 100 articles published in peer-reviewed journals, and the benefits include reducing risk factors for (and, in some cases, even reversing) high cholesterol, high blood pressure, type 2 diabetes, obesity, and more! An overview of the three key pillars of the Pritikin Program will be covered: eating well, doing regular exercise, and having a healthy mind-set.  WORKSHOPS  Exercise: Exercise Basics: Building Your Action Plan Clinical staff led group instruction and group discussion with PowerPoint presentation and patient guidebook. To enhance the learning environment the use of posters, models and videos may be added. At the conclusion of this workshop, patients will comprehend the difference between physical activity and exercise, as well as the benefits of incorporating both, into their routine. Patients will understand the FITT (Frequency, Intensity, Time, and Type) principle and how to use it to build an exercise action plan. In addition, safety concerns and other considerations for exercise and cardiac rehab will be addressed by the presenter. The purpose of this lesson is to promote a comprehensive and effective weekly exercise routine in order to improve patients overall level of fitness.   Managing Heart  Disease: Your Path to a Healthier Heart Clinical staff led group instruction and group discussion with PowerPoint presentation and patient guidebook. To enhance the learning environment the use of posters, models and videos may be  added.At the conclusion of this workshop, patients will understand the anatomy and physiology of the heart. Additionally, they will understand how Pritikins three pillars impact the risk factors, the progression, and the management of heart disease.  The purpose of this lesson is to provide a high-level overview of the heart, heart disease, and how the Pritikin lifestyle positively impacts risk factors.  Exercise Biomechanics Clinical staff led group instruction and group discussion with PowerPoint presentation and patient guidebook. To enhance the learning environment the use of posters, models and videos may be added. Patients will learn how the structural parts of their bodies function and how these functions impact their daily activities, movement, and exercise. Patients will learn how to promote a neutral spine, learn how to manage pain, and identify ways to improve their physical movement in order to promote healthy living. The purpose of this lesson is to expose patients to common physical limitations that impact physical activity. Participants will learn practical ways to adapt and manage aches and pains, and to minimize their effect on regular exercise. Patients will learn how to maintain good posture while sitting, walking, and lifting.  Balance Training and Fall Prevention  Clinical staff led group instruction and group discussion with PowerPoint presentation and patient guidebook. To enhance the learning environment the use of posters, models and videos may be added. At the conclusion of this workshop, patients will understand the importance of their sensorimotor skills (vision, proprioception, and the vestibular system) in maintaining their ability to  balance as they age. Patients will apply a variety of balancing exercises that are appropriate for their current level of function. Patients will understand the common causes for poor balance, possible solutions to these problems, and ways to modify their physical environment in order to minimize their fall risk. The purpose of this lesson is to teach patients about the importance of maintaining balance as they age and ways to minimize their risk of falling.  WORKSHOPS   Nutrition:  Fueling a Ship Broker led group instruction and group discussion with PowerPoint presentation and patient guidebook. To enhance the learning environment the use of posters, models and videos may be added. Patients will review the foundational principles of the Pritikin Eating Plan and understand what constitutes a serving size in each of the food groups. Patients will also learn Pritikin-friendly foods that are better choices when away from home and review make-ahead meal and snack options. Calorie density will be reviewed and applied to three nutrition priorities: weight maintenance, weight loss, and weight gain. The purpose of this lesson is to reinforce (in a group setting) the key concepts around what patients are recommended to eat and how to apply these guidelines when away from home by planning and selecting Pritikin-friendly options. Patients will understand how calorie density may be adjusted for different weight management goals.  Mindful Eating  Clinical staff led group instruction and group discussion with PowerPoint presentation and patient guidebook. To enhance the learning environment the use of posters, models and videos may be added. Patients will briefly review the concepts of the Pritikin Eating Plan and the importance of low-calorie dense foods. The concept of mindful eating will be introduced as well as the importance of paying attention to internal hunger signals. Triggers for non-hunger  eating and techniques for dealing with triggers will be explored. The purpose of this lesson is to provide patients with the opportunity to review the basic principles of the Pritikin Eating Plan, discuss the value of eating mindfully and  how to measure internal cues of hunger and fullness using the Hunger Scale. Patients will also discuss reasons for non-hunger eating and learn strategies to use for controlling emotional eating.  Targeting Your Nutrition Priorities Clinical staff led group instruction and group discussion with PowerPoint presentation and patient guidebook. To enhance the learning environment the use of posters, models and videos may be added. Patients will learn how to determine their genetic susceptibility to disease by reviewing their family history. Patients will gain insight into the importance of diet as part of an overall healthy lifestyle in mitigating the impact of genetics and other environmental insults. The purpose of this lesson is to provide patients with the opportunity to assess their personal nutrition priorities by looking at their family history, their own health history and current risk factors. Patients will also be able to discuss ways of prioritizing and modifying the Pritikin Eating Plan for their highest risk areas  Menu  Clinical staff led group instruction and group discussion with PowerPoint presentation and patient guidebook. To enhance the learning environment the use of posters, models and videos may be added. Using menus brought in from [employer], or printed from [employer], patients will apply the Pritikin dining out guidelines that were presented in the [employer] video. Patients will also be able to practice these guidelines in a variety of provided scenarios. The purpose of this lesson is to provide patients with the opportunity to practice hands-on learning of the Pritikin Dining Out guidelines with actual menus and practice  scenarios.  Label Reading Clinical staff led group instruction and group discussion with PowerPoint presentation and patient guidebook. To enhance the learning environment the use of posters, models and videos may be added. Patients will review and discuss the Pritikin label reading guidelines presented in Pritikins Label Reading Educational series video. Using fool labels brought in from local grocery stores and markets, patients will apply the label reading guidelines and determine if the packaged food meet the Pritikin guidelines. The purpose of this lesson is to provide patients with the opportunity to review, discuss, and practice hands-on learning of the Pritikin Label Reading guidelines with actual packaged food labels. Cooking School  Pritikins [employer] are designed to teach patients ways to prepare quick, simple, and affordable recipes at home. The importance of nutritions role in chronic disease risk reduction is reflected in its emphasis in the overall Pritikin program. By learning how to prepare essential core Pritikin Eating Plan recipes, patients will increase control over what they eat; be able to customize the flavor of foods without the use of added salt, sugar, or fat; and improve the quality of the food they consume. By learning a set of core recipes which are easily assembled, quickly prepared, and affordable, patients are more likely to prepare more healthy foods at home. These workshops focus on convenient breakfasts, simple entres, side dishes, and desserts which can be prepared with minimal effort and are consistent with nutrition recommendations for cardiovascular risk reduction. Cooking [employer] are taught by a armed forces logistics/support/administrative officer (RD) who has been trained by the [employer]. The chef or RD has a clear understanding of the importance of minimizing - if not completely eliminating - added fat, sugar, and sodium in recipes. Throughout  the series of Cooking School Workshop sessions, patients will learn about healthy ingredients and efficient methods of cooking to build confidence in their capability to prepare    [employer] weekly topics:  Adding  Flavor- Sodium-Free  Fast and Healthy Breakfasts  Powerhouse Plant-Based Proteins  Satisfying Salads and Dressings  Simple Sides and Sauces  International Cuisine-Spotlight on the Blue Zones  Delicious Desserts  Savory Soups  Efficiency Cooking - Meals in a Snap  Tasty Appetizers and Snacks  Comforting Weekend Breakfasts  One-Pot Wonders   Fast Evening Meals  Landscape Architect Your Pritikin Plate  WORKSHOPS   Healthy Mindset (Psychosocial):  Focused Goals, Sustainable Changes Clinical staff led group instruction and group discussion with PowerPoint presentation and patient guidebook. To enhance the learning environment the use of posters, models and videos may be added. Patients will be able to apply effective goal setting strategies to establish at least one personal goal, and then take consistent, meaningful action toward that goal. They will learn to identify common barriers to achieving personal goals and develop strategies to overcome them. Patients will also gain an understanding of how our mind-set can impact our ability to achieve goals and the importance of cultivating a positive and growth-oriented mind-set. The purpose of this lesson is to provide patients with a deeper understanding of how to set and achieve personal goals, as well as the tools and strategies needed to overcome common obstacles which may arise along the way.  From Head to Heart: The Power of a Healthy Outlook  Clinical staff led group instruction and group discussion with PowerPoint presentation and patient guidebook. To enhance the learning environment the use of posters, models and videos may be added. Patients will be able to recognize and describe the impact of emotions and  mood on physical health. They will discover the importance of self-care and explore self-care practices which may work for them. Patients will also learn how to utilize the 4 Cs to cultivate a healthier outlook and better manage stress and challenges. The purpose of this lesson is to demonstrate to patients how a healthy outlook is an essential part of maintaining good health, especially as they continue their cardiac rehab journey.  Healthy Sleep for a Healthy Heart Clinical staff led group instruction and group discussion with PowerPoint presentation and patient guidebook. To enhance the learning environment the use of posters, models and videos may be added. At the conclusion of this workshop, patients will be able to demonstrate knowledge of the importance of sleep to overall health, well-being, and quality of life. They will understand the symptoms of, and treatments for, common sleep disorders. Patients will also be able to identify daytime and nighttime behaviors which impact sleep, and they will be able to apply these tools to help manage sleep-related challenges. The purpose of this lesson is to provide patients with a general overview of sleep and outline the importance of quality sleep. Patients will learn about a few of the most common sleep disorders. Patients will also be introduced to the concept of sleep hygiene, and discover ways to self-manage certain sleeping problems through simple daily behavior changes. Finally, the workshop will motivate patients by clarifying the links between quality sleep and their goals of heart-healthy living.   Recognizing and Reducing Stress Clinical staff led group instruction and group discussion with PowerPoint presentation and patient guidebook. To enhance the learning environment the use of posters, models and videos may be added. At the conclusion of this workshop, patients will be able to understand the types of stress reactions, differentiate between  acute and chronic stress, and recognize the impact that chronic stress has on their health. They will also be able to apply different  coping mechanisms, such as reframing negative self-talk. Patients will have the opportunity to practice a variety of stress management techniques, such as deep abdominal breathing, progressive muscle relaxation, and/or guided imagery.  The purpose of this lesson is to educate patients on the role of stress in their lives and to provide healthy techniques for coping with it.  Learning Barriers/Preferences:  Learning Barriers/Preferences - 10/16/24 1350       Learning Barriers/Preferences   Learning Barriers Sight    Learning Preferences Audio;Computer/Internet;Group Instruction;Individual Instruction;Pictoral;Skilled Demonstration;Verbal Instruction;Video;Written Material          Education Topics:  Knowledge Questionnaire Score:  Knowledge Questionnaire Score - 10/16/24 1351       Knowledge Questionnaire Score   Pre Score 18/24          Core Components/Risk Factors/Patient Goals at Admission:  Personal Goals and Risk Factors at Admission - 10/16/24 0856       Core Components/Risk Factors/Patient Goals on Admission   Diabetes Yes    Intervention Provide education about signs/symptoms and action to take for hypo/hyperglycemia.;Provide education about proper nutrition, including hydration, and aerobic/resistive exercise prescription along with prescribed medications to achieve blood glucose in normal ranges: Fasting glucose 65-99 mg/dL    Expected Outcomes Short Term: Participant verbalizes understanding of the signs/symptoms and immediate care of hyper/hypoglycemia, proper foot care and importance of medication, aerobic/resistive exercise and nutrition plan for blood glucose control.;Long Term: Attainment of HbA1C < 7%.    Hypertension Yes    Intervention Provide education on lifestyle modifcations including regular physical activity/exercise, weight  management, moderate sodium restriction and increased consumption of fresh fruit, vegetables, and low fat dairy, alcohol moderation, and smoking cessation.;Monitor prescription use compliance.    Expected Outcomes Long Term: Maintenance of blood pressure at goal levels.;Short Term: Continued assessment and intervention until BP is < 140/4mm HG in hypertensive participants. < 130/51mm HG in hypertensive participants with diabetes, heart failure or chronic kidney disease.    Lipids Yes    Intervention Provide education and support for participant on nutrition & aerobic/resistive exercise along with prescribed medications to achieve LDL 70mg , HDL >40mg .    Expected Outcomes Short Term: Participant states understanding of desired cholesterol values and is compliant with medications prescribed. Participant is following exercise prescription and nutrition guidelines.;Long Term: Cholesterol controlled with medications as prescribed, with individualized exercise RX and with personalized nutrition plan. Value goals: LDL < 70mg , HDL > 40 mg.          Core Components/Risk Factors/Patient Goals Review:   Goals and Risk Factor Review     Row Name 10/22/24 1643 10/29/24 1707 11/24/24 1156 12/19/24 1422       Core Components/Risk Factors/Patient Goals Review   Personal Goals Review Weight Management/Obesity;Hypertension;Lipids;Diabetes Weight Management/Obesity;Hypertension;Lipids;Diabetes Weight Management/Obesity;Hypertension;Lipids;Diabetes Weight Management/Obesity;Hypertension;Lipids;Diabetes    Review Garnett started cardiac rehab on 10/22/24. Giancarlo did well with exercise for his fitness level. vital signs and CBg's were WNL. Kenly is somewhat deconditioned but said that he felt better after exercising. Alano started cardiac rehab on 10/22/24. Deen is doing well with exercise for his fitness level. vital signs and CBg's have been within normal limits. Beuford continues to do  well with exercise  for his fitness level. Vital signs and CBG's remain stable. Kayne continues to do  well with exercise for his fitness level. Vital signs and CBG's remain stable. Zell has gained 1.7 kg since starting cardiac rehab. Zell will tenatively complete cardiac rehab on 12/19/24    Expected Outcomes Ely will continue to  participate in cardiac rehab for exercise, nutrition and lifestyle modifications. Taye will continue to participate in cardiac rehab for exercise, nutrition and lifestyle modifications. Rey will continue to participate in cardiac rehab for exercise, nutrition and lifestyle modifications. Yordin will continue to participate in cardiac rehab for exercise, nutrition and lifestyle modifications.       Core Components/Risk Factors/Patient Goals at Discharge (Final Review):   Goals and Risk Factor Review - 12/19/24 1422       Core Components/Risk Factors/Patient Goals Review   Personal Goals Review Weight Management/Obesity;Hypertension;Lipids;Diabetes    Review Valerie continues to do  well with exercise for his fitness level. Vital signs and CBG's remain stable. Zell has gained 1.7 kg since starting cardiac rehab. Zell will tenatively complete cardiac rehab on 12/19/24    Expected Outcomes Travarus will continue to participate in cardiac rehab for exercise, nutrition and lifestyle modifications.          ITP Comments:  ITP Comments     Row Name 10/16/24 9164 10/22/24 1632 10/29/24 1705 11/24/24 1148 12/19/24 1420   ITP Comments Wilbert Bihari, MD: Medical Director. Introduction to the [facility] program / Intensive Cardiac Rehab.  Initial orientation packet reviewed with the patient. 30 Day ITP Review. Toryn started cardiac rehab on 10/22/24. Jdyn did 30 Day ITP Review. Tully started cardiac rehab on 10/22/24. Adyn is off to a good start to exercise for his fitness level. 30 Day ITP Review. Staci has good attendance and participation with exercise at cardiac  rehab 30 Day ITP Review. Milton continues to have good attendance and participation with exercise at cardiac rehab      Comments: See ITP comments.Hadassah Elpidio Quan RN BSN      [1]  Current Outpatient Medications:    alfuzosin  (UROXATRAL ) 10 MG 24 hr tablet, Take 1 tablet (10 mg total) by mouth at bedtime., Disp: 30 tablet, Rfl: 1   aspirin  EC 81 MG tablet, Take 1 tablet (81 mg total) by mouth daily., Disp: , Rfl:    azelastine  (ASTELIN ) 0.1 % nasal spray, Place 2 sprays into both nostrils 2 (two) times daily., Disp: 30 mL, Rfl: 12   benzonatate  (TESSALON ) 200 MG capsule, Take 1 capsule (200 mg total) by mouth 2 (two) times daily as needed for cough., Disp: 20 capsule, Rfl: 0   Blood Glucose Monitoring Suppl DEVI, 1 each by Does not apply route as directed. Dispense based on patient and insurance preference. Use up to four times daily as directed. DX E11.69, Disp: 1 each, Rfl: 0   finasteride  (PROSCAR ) 5 MG tablet, TAKE 1 TABLET (5 MG TOTAL) BY MOUTH DAILY., Disp: 90 tablet, Rfl: 0   Glucose Blood (BLOOD GLUCOSE TEST STRIPS) STRP, 1 each by Does not apply route as directed. Dispense based on patient and insurance preference. Use up to four times daily as directed. Dx E11.9., Disp: 100 strip, Rfl: 0   ibuprofen  (ADVIL ,MOTRIN ) 200 MG tablet, Take 200 mg by mouth 2 (two) times daily as needed (arthritis apin). , Disp: , Rfl:    isosorbide  mononitrate (IMDUR ) 30 MG 24 hr tablet, Take 1 tablet (30 mg total) by mouth every morning., Disp: 30 tablet, Rfl: 2   Lancet Device MISC, 1 each by Does not apply route as directed. Dispense based on patient and insurance preference. Use up to four times daily as directed. Dx E11.69, Disp: 1 each, Rfl: 0   Lancets MISC, 1 each by Does not apply route as directed. Dispense based on patient and insurance  preference. Use up to four times daily as directed. E11.69, Disp: 100 each, Rfl: 0   metFORMIN  (GLUCOPHAGE ) 500 MG tablet, Take 1 tablet (500 mg total) by  mouth daily with breakfast., Disp: 90 tablet, Rfl: 1   metoprolol  succinate (TOPROL -XL) 25 MG 24 hr tablet, Take 1 tablet (25 mg total) by mouth at bedtime. TAKE WITH OR IMMEDIATELY FOLLOWING A MEAL., Disp: 90 tablet, Rfl: 3   Multiple Vitamin (MULTIVITAMIN WITH MINERALS) TABS tablet, Take 1 tablet by mouth at bedtime., Disp: , Rfl:    nitroGLYCERIN  (NITROSTAT ) 0.4 MG SL tablet, Place 0.4 mg under the tongue every 5 (five) minutes as needed for chest pain., Disp: , Rfl:    simvastatin  (ZOCOR ) 80 MG tablet, TAKE 1 TABLET DAILY. PATIENT NEEDS AN APPOINTMENT FOR FURTHER REFILLS. 3 RD/FINAL ATTEMPT, Disp: 90 tablet, Rfl: 1   triamcinolone  ointment (KENALOG ) 0.5 %, APPLY 1 APPLICATION TOPICALLY 2 (TWO) TIMES DAILY., Disp: 30 g, Rfl: 5 [2]  Social History Tobacco Use  Smoking Status Former   Current packs/day: 0.00   Types: Cigarettes   Quit date: 11/29/1980   Years since quitting: 44.1  Smokeless Tobacco Never

## 2024-12-21 ENCOUNTER — Encounter (HOSPITAL_COMMUNITY): Admission: RE | Admit: 2024-12-21 | Source: Ambulatory Visit

## 2024-12-21 DIAGNOSIS — Z48812 Encounter for surgical aftercare following surgery on the circulatory system: Secondary | ICD-10-CM | POA: Diagnosis not present

## 2024-12-21 DIAGNOSIS — I2089 Other forms of angina pectoris: Secondary | ICD-10-CM

## 2024-12-24 ENCOUNTER — Encounter (HOSPITAL_COMMUNITY)

## 2024-12-25 ENCOUNTER — Encounter: Payer: Self-pay | Admitting: Cardiology

## 2024-12-25 ENCOUNTER — Ambulatory Visit: Attending: Cardiology | Admitting: Cardiology

## 2024-12-25 VITALS — BP 110/64 | HR 64 | Ht 70.0 in | Wt 193.4 lb

## 2024-12-25 DIAGNOSIS — I25118 Atherosclerotic heart disease of native coronary artery with other forms of angina pectoris: Secondary | ICD-10-CM

## 2024-12-25 DIAGNOSIS — E088 Diabetes mellitus due to underlying condition with unspecified complications: Secondary | ICD-10-CM | POA: Diagnosis not present

## 2024-12-25 DIAGNOSIS — Z951 Presence of aortocoronary bypass graft: Secondary | ICD-10-CM | POA: Diagnosis not present

## 2024-12-25 DIAGNOSIS — E782 Mixed hyperlipidemia: Secondary | ICD-10-CM

## 2024-12-25 DIAGNOSIS — I10 Essential (primary) hypertension: Secondary | ICD-10-CM

## 2024-12-25 NOTE — Progress Notes (Signed)
 " Cardiology Office Note:    Date:  12/25/2024   [name], DOB 1946/01/06, MRN [id number]  PCP:  Vincent Worth HERO, MD  Cardiologist:  Vincent JONELLE Crape, MD   Referring MD: Vincent Worth HERO, MD    ASSESSMENT:    1. Atherosclerosis of native coronary artery of native heart with stable angina pectoris   2. Essential hypertension   3. Diabetes mellitus due to underlying condition with unspecified complications (HCC)   4. Hx of CABG   5. Mixed dyslipidemia    PLAN:    In order of problems listed above:  Coronary artery disease: Secondary prevention stressed with the patient.  Importance of compliance with diet medication stressed and patient verbalized standing.  Patient was advised to walk at least half an hour a day on a daily basis.  He does exercise at the cardiac rehab which is ending soon according to the patient. Essential hypertension: Blood pressure is stable and diet was emphasized.  Lifestyle modification urged.  I told him to take normal amount of salt in the diet without any extra salt.  His blood pressure is low at times especially after exercise. Mixed dyslipidemia: On lipid-lowering medications followed by primary care.  Lipids are at goal he is fasting and will have complete blood work including hemoglobin A1c.  He request vitamin D for screening in view of history of deficiency and we will do this for him. Patient will be seen in follow-up appointment in 9 months or earlier if the patient has any concerns.    Medication Adjustments/Labs and Tests Ordered: Current medicines are reviewed at length with the patient today.  Concerns regarding medicines are outlined above.  No orders of the defined types were placed in this encounter.  No orders of the defined types were placed in this encounter.    No chief complaint on file.    History of Present Illness:    Vincent Stevens is a 79 y.o. male.  Patient has past medical history of coronary artery disease,  essential hypertension, mixed dyslipidemia and diabetes mellitus.  He is in cardiac rehab without any problems.  He denies any chest pain orthopnea or PND.  He has low blood pressure after exercise but this is routine for him.  At the time of my evaluation, the patient is alert awake oriented and in no distress.  Past Medical History:  Diagnosis Date   Allergic rhinitis 06/17/2021   Allergy    Angina pectoris 11/02/2023   Angina, class III 03/01/2018   Arthralgia of right knee 08/07/2024   Atherosclerosis of native coronary artery of native heart with stable angina pectoris 08/12/2024   BPH (benign prostatic hyperplasia) 09/19/2009   Qualifier: Diagnosis of  By: Jame  MD, Vincent Stevens    Chest pain of uncertain etiology 10/20/2023   Coronary artery disease involving native coronary artery of native heart with angina pectoris    Diabetes mellitus due to underlying condition with unspecified complications (HCC) 09/13/2022   Dyslipidemia 08/14/2010   Qualifier: Diagnosis of  By: Jame  MD, Vincent Stevens    Dyslipidemia associated with type 2 diabetes mellitus (HCC) 08/14/2010   Lab Results      Component    Value    Date           CHOL    134    04/19/2018           HDL    43    04/19/2018  LDLCALC    70    04/19/2018           TRIG    105    04/19/2018           CHOLHDL    3.1    04/19/2018           Dysuria 08/14/2024   Essential hypertension 01/17/2009   Qualifier: Diagnosis of  By: Jame  MD, Vincent Stevens    GERD (gastroesophageal reflux disease) 09/09/2022   Gout    History of colonic polyps 09/27/2008   Qualifier: Diagnosis of  By: Jame  MD, Vincent Stevens  Initial colonoscopy 2003.  Hyperplastic polyps only Followup colonoscopy 2008.  Normal  10 year interval recommended    Hx of CABG 03/01/2018   Left main coronary artery disease    Melanoma (HCC)    Mixed dyslipidemia 09/21/2024   Near syncope 08/14/2024   Osteoarthritis of knee 08/07/2024   Pain in both lower  extremities 08/15/2018   Prediabetes 09/13/2022   Sinus bradycardia 08/14/2024    Past Surgical History:  Procedure Laterality Date   CORONARY ARTERY BYPASS GRAFT N/A 03/01/2018   Procedure: CORONARY ARTERY BYPASS GRAFTING (CABG) times three using the right saphaneous vein. Harvested endoscopicly; and left internal mammary artery.;  Surgeon: Vincent Dorise POUR, MD;  Location: [facility] OR;  Service: Open Heart Surgery;  Laterality: N/A;   HERNIA REPAIR     LEFT HEART CATH AND CORONARY ANGIOGRAPHY N/A 03/01/2018   Procedure: LEFT HEART CATH AND CORONARY ANGIOGRAPHY;  Surgeon: Vincent Alm ORN, MD;  Location: [facility] INVASIVE CV LAB;  Service: Cardiovascular;  Laterality: N/A;   LEFT HEART CATH AND CORS/GRAFTS ANGIOGRAPHY N/A 11/04/2023   Procedure: LEFT HEART CATH AND CORS/GRAFTS ANGIOGRAPHY;  Surgeon: Vincent Heinz, MD;  Location: [facility] INVASIVE CV LAB;  Service: Cardiovascular;  Laterality: N/A;   melanoma removal     TEE WITHOUT CARDIOVERSION N/A 03/01/2018   Procedure: TRANSESOPHAGEAL ECHOCARDIOGRAM (TEE);  Surgeon: Vincent Dorise POUR, MD;  Location: [facility];  Service: Open Heart Surgery;  Laterality: N/A;    Current Medications: Active Medications[1]   Allergies:   Sulfamethoxazole   Social History   Socioeconomic History   Marital status: Married    Spouse name: Not on file   Number of children: 1   Years of education: Not on file   Highest education level: Not on file  Occupational History   Occupation: Courier     Comment: Part time   Tobacco Use   Smoking status: Former    Current packs/day: 0.00    Types: Cigarettes    Quit date: 11/29/1980    Years since quitting: 44.1   Smokeless tobacco: Never  Vaping Use   Vaping status: Never Used  Substance and Sexual Activity   Alcohol use: No   Drug use: No   Sexual activity: Not on file  Other Topics Concern   Not on file  Social History Narrative   Not on file   Social Drivers of Health   Tobacco Use: Medium Risk (12/25/2024)   Patient History     Smoking Tobacco Use: Former    Smokeless Tobacco Use: Never    Passive Exposure: Not on file  Financial Resource Strain: Low Risk (04/10/2024)   Overall Financial Resource Strain (CARDIA)    Difficulty of Paying Living Expenses: Not hard at all  Food Insecurity: No Food Insecurity (08/14/2024)   Epic    Worried About Radiation Protection Practitioner of Food in the Last Year: Never  true    Ran Out of Food in the Last Year: Never true  Transportation Needs: No Transportation Needs (08/14/2024)   Epic    Lack of Transportation (Medical): No    Lack of Transportation (Non-Medical): No  Physical Activity: Inactive (04/10/2024)   Exercise Vital Sign    Days of Exercise per Week: 0 days    Minutes of Exercise per Session: 0 min  Stress: No Stress Concern Present (04/10/2024)   [employer] of Occupational Health - Occupational Stress Questionnaire    Feeling of Stress : Not at all  Social Connections: Moderately Isolated (08/14/2024)   Social Connection and Isolation Panel    Frequency of Communication with Friends and Family: More than three times a week    Frequency of Social Gatherings with Friends and Family: Three times a week    Attends Religious Services: Never    Active Member of Clubs or Organizations: No    Attends Banker Meetings: Never    Marital Status: Married  Depression (PHQ2-9): Medium Risk (10/16/2024)   Depression (PHQ2-9)    PHQ-2 Score: 6  Alcohol Screen: Low Risk (04/10/2024)   Alcohol Screen    Last Alcohol Screening Score (AUDIT): 0  Housing: Low Risk (08/14/2024)   Epic    Unable to Pay for Housing in the Last Year: No    Number of Times Moved in the Last Year: 0    Homeless in the Last Year: No  Utilities: Not At Risk (08/14/2024)   Epic    Threatened with loss of utilities: No  Health Literacy: Adequate Health Literacy (04/10/2024)   B1300 Health Literacy    Frequency of need for help with medical instructions: Never     Family History: The patient's  family history includes Cancer in his sister; Dementia in his mother; Diabetes in an other family member; Heart attack in his father; Hypertension in an other family member.  ROS:   Please see the history of present illness.    All other systems reviewed and are negative.  EKGs/Labs/Other Studies Reviewed:    The following studies were reviewed today: I discussed my findings with the patient at length   Recent Labs: 08/13/2024: ALT 28; Hemoglobin 15.1; Platelets 208 08/15/2024: BUN 29; Creatinine, Ser 1.27; Potassium 4.6; Sodium 136  Recent Lipid Panel    Component Value Date/Time   CHOL 141 03/01/2024 0825   TRIG 68 03/01/2024 0825   HDL 53 03/01/2024 0825   CHOLHDL 2.7 03/01/2024 0825   CHOLHDL 3 09/12/2023 1021   VLDL 12.8 09/12/2023 1021   LDLCALC 74 03/01/2024 0825    Physical Exam:    VS:  BP 110/64   Pulse 64   Ht 5' 10 (1.778 m)   Wt 193 lb 6.4 oz (87.7 kg)   SpO2 99%   BMI 27.75 kg/m     Wt Readings from Last 3 Encounters:  12/25/24 193 lb 6.4 oz (87.7 kg)  10/16/24 190 lb 11.2 oz (86.5 kg)  09/27/24 187 lb 12.8 oz (85.2 kg)     GEN: Patient is in no acute distress HEENT: Normal NECK: No JVD; No carotid bruits LYMPHATICS: No lymphadenopathy CARDIAC: Hear sounds regular, 2/6 systolic murmur at the apex. RESPIRATORY:  Clear to auscultation without rales, wheezing or rhonchi  ABDOMEN: Soft, non-tender, non-distended MUSCULOSKELETAL:  No edema; No deformity  SKIN: Warm and dry NEUROLOGIC:  Alert and oriented x 3 PSYCHIATRIC:  Normal affect   Signed, Vincent JONELLE Crape, MD  12/25/2024 10:26  AM    [facility]     [1]  Current Meds  Medication Sig   alfuzosin  (UROXATRAL ) 10 MG 24 hr tablet Take 1 tablet (10 mg total) by mouth at bedtime.   aspirin  EC 81 MG tablet Take 1 tablet (81 mg total) by mouth daily.   azelastine  (ASTELIN ) 0.1 % nasal spray Place 2 sprays into both nostrils 2 (two) times daily.   Blood Glucose Monitoring  Suppl DEVI 1 each by Does not apply route as directed. Dispense based on patient and insurance preference. Use up to four times daily as directed. DX E11.69   finasteride  (PROSCAR ) 5 MG tablet TAKE 1 TABLET (5 MG TOTAL) BY MOUTH DAILY.   Glucose Blood (BLOOD GLUCOSE TEST STRIPS) STRP 1 each by Does not apply route as directed. Dispense based on patient and insurance preference. Use up to four times daily as directed. Dx E11.9.   ibuprofen  (ADVIL ,MOTRIN ) 200 MG tablet Take 200 mg by mouth 2 (two) times daily as needed (arthritis apin).    isosorbide  mononitrate (IMDUR ) 30 MG 24 hr tablet Take 1 tablet (30 mg total) by mouth every morning.   Lancet Device MISC 1 each by Does not apply route as directed. Dispense based on patient and insurance preference. Use up to four times daily as directed. Dx E11.69   Lancets MISC 1 each by Does not apply route as directed. Dispense based on patient and insurance preference. Use up to four times daily as directed. E11.69   metFORMIN  (GLUCOPHAGE ) 500 MG tablet Take 1 tablet (500 mg total) by mouth daily with breakfast.   metoprolol  succinate (TOPROL -XL) 25 MG 24 hr tablet Take 1 tablet (25 mg total) by mouth at bedtime. TAKE WITH OR IMMEDIATELY FOLLOWING A MEAL.   Multiple Vitamin (MULTIVITAMIN WITH MINERALS) TABS tablet Take 1 tablet by mouth at bedtime.   nitroGLYCERIN  (NITROSTAT ) 0.4 MG SL tablet Place 0.4 mg under the tongue every 5 (five) minutes as needed for chest pain.   simvastatin  (ZOCOR ) 80 MG tablet TAKE 1 TABLET DAILY. PATIENT NEEDS AN APPOINTMENT FOR FURTHER REFILLS. 3 RD/FINAL ATTEMPT   triamcinolone  ointment (KENALOG ) 0.5 % APPLY 1 APPLICATION TOPICALLY 2 (TWO) TIMES DAILY.   [DISCONTINUED] tamsulosin  (FLOMAX ) 0.4 MG CAPS capsule Take 0.4 mg by mouth daily.   "

## 2024-12-25 NOTE — Patient Instructions (Signed)
 Medication Instructions:  Your physician recommends that you continue on your current medications as directed. Please refer to the Current Medication list given to you today.  *If you need a refill on your cardiac medications before your next appointment, please call your pharmacy*  Lab Work: Your physician recommends that you return for lab work in:   Labs today: CMP, Lipids, CBC, TSH, Vitamin D, Hbg A1c  If you have labs (blood work) drawn today and your tests are completely normal, you will receive your results only by: MyChart Message (if you have MyChart) OR A paper copy in the mail If you have any lab test that is abnormal or we need to change your treatment, we will call you to review the results.  Testing/Procedures: None  Follow-Up: At [facility], you and your health needs are our priority.  As part of our continuing mission to provide you with exceptional heart care, our providers are all part of one team.  This team includes your primary Cardiologist (physician) and Advanced Practice Providers or APPs (Physician Assistants and Nurse Practitioners) who all work together to provide you with the care you need, when you need it.  Your next appointment:   9 month(s)  Provider:   Jennifer Crape, MD    We recommend signing up for the patient portal called MyChart.  Sign up information is provided on this After Visit Summary.  MyChart is used to connect with patients for Virtual Visits (Telemedicine).  Patients are able to view lab/test results, encounter notes, upcoming appointments, etc.  Non-urgent messages can be sent to your provider as well.   To learn more about what you can do with MyChart, go to [email or web address].   Other Instructions None

## 2024-12-26 ENCOUNTER — Encounter (HOSPITAL_COMMUNITY)
Admission: RE | Admit: 2024-12-26 | Discharge: 2024-12-26 | Disposition: A | Source: Ambulatory Visit | Attending: Cardiology

## 2024-12-26 ENCOUNTER — Ambulatory Visit: Payer: Self-pay | Admitting: Cardiology

## 2024-12-26 DIAGNOSIS — I25119 Atherosclerotic heart disease of native coronary artery with unspecified angina pectoris: Secondary | ICD-10-CM | POA: Diagnosis not present

## 2024-12-26 DIAGNOSIS — I491 Atrial premature depolarization: Secondary | ICD-10-CM | POA: Diagnosis not present

## 2024-12-26 DIAGNOSIS — R001 Bradycardia, unspecified: Secondary | ICD-10-CM | POA: Diagnosis not present

## 2024-12-26 DIAGNOSIS — R079 Chest pain, unspecified: Secondary | ICD-10-CM

## 2024-12-26 DIAGNOSIS — I2089 Other forms of angina pectoris: Secondary | ICD-10-CM

## 2024-12-26 DIAGNOSIS — Z87891 Personal history of nicotine dependence: Secondary | ICD-10-CM | POA: Diagnosis not present

## 2024-12-26 DIAGNOSIS — Z48812 Encounter for surgical aftercare following surgery on the circulatory system: Secondary | ICD-10-CM | POA: Diagnosis not present

## 2024-12-26 DIAGNOSIS — Z951 Presence of aortocoronary bypass graft: Secondary | ICD-10-CM | POA: Diagnosis not present

## 2024-12-26 LAB — COMPREHENSIVE METABOLIC PANEL WITH GFR
ALT: 22 [IU]/L (ref 0–44)
AST: 30 [IU]/L (ref 0–40)
Albumin: 4.3 g/dL (ref 3.8–4.8)
Alkaline Phosphatase: 56 [IU]/L (ref 47–123)
BUN/Creatinine Ratio: 14 (ref 10–24)
BUN: 17 mg/dL (ref 8–27)
Bilirubin Total: 0.5 mg/dL (ref 0.0–1.2)
CO2: 24 mmol/L (ref 20–29)
Calcium: 10 mg/dL (ref 8.6–10.2)
Chloride: 103 mmol/L (ref 96–106)
Creatinine, Ser: 1.19 mg/dL (ref 0.76–1.27)
Globulin, Total: 2.1 g/dL (ref 1.5–4.5)
Glucose: 99 mg/dL (ref 70–99)
Potassium: 5 mmol/L (ref 3.5–5.2)
Sodium: 139 mmol/L (ref 134–144)
Total Protein: 6.4 g/dL (ref 6.0–8.5)
eGFR: 62 mL/min/{1.73_m2}

## 2024-12-26 LAB — LIPID PANEL
Chol/HDL Ratio: 2.4 ratio (ref 0.0–5.0)
Cholesterol, Total: 142 mg/dL (ref 100–199)
HDL: 59 mg/dL
LDL Chol Calc (NIH): 69 mg/dL (ref 0–99)
Triglycerides: 73 mg/dL (ref 0–149)
VLDL Cholesterol Cal: 14 mg/dL (ref 5–40)

## 2024-12-26 LAB — CBC
Hematocrit: 50.9 % (ref 37.5–51.0)
Hemoglobin: 16.5 g/dL (ref 13.0–17.7)
MCH: 29.8 pg (ref 26.6–33.0)
MCHC: 32.4 g/dL (ref 31.5–35.7)
MCV: 92 fL (ref 79–97)
Platelets: 192 10*3/uL (ref 150–450)
RBC: 5.53 x10E6/uL (ref 4.14–5.80)
RDW: 12 % (ref 11.6–15.4)
WBC: 7 10*3/uL (ref 3.4–10.8)

## 2024-12-26 LAB — HEMOGLOBIN A1C
Est. average glucose Bld gHb Est-mCnc: 117 mg/dL
Hgb A1c MFr Bld: 5.7 % — ABNORMAL HIGH (ref 4.8–5.6)

## 2024-12-26 LAB — VITAMIN D 25 HYDROXY (VIT D DEFICIENCY, FRACTURES): Vit D, 25-Hydroxy: 27.1 ng/mL — ABNORMAL LOW (ref 30.0–100.0)

## 2024-12-26 LAB — TSH: TSH: 3.88 u[IU]/mL (ref 0.450–4.500)

## 2024-12-26 NOTE — Progress Notes (Signed)
 Pt experienced exertional angina, lasting . Rated his pain 3-4 out of 10. 120/70 immediately after CP episode, 100% on RA. EKG obtained, showing SB with PACs, otherwise normal. Denies having any chest pain now. Please advise. 118/70 BP upon recheck after EKG.  Seen by onsite provider who indicated pt could return to cardiac rehab, that he would not be making any medication changes at this time, and educated pt on signs/symptoms of when to call 911 and present to ED.

## 2024-12-28 ENCOUNTER — Encounter (HOSPITAL_COMMUNITY)
Admission: RE | Admit: 2024-12-28 | Discharge: 2024-12-28 | Disposition: A | Source: Ambulatory Visit | Attending: Cardiology

## 2024-12-28 DIAGNOSIS — I2089 Other forms of angina pectoris: Secondary | ICD-10-CM

## 2024-12-28 DIAGNOSIS — Z48812 Encounter for surgical aftercare following surgery on the circulatory system: Secondary | ICD-10-CM | POA: Diagnosis not present

## 2024-12-31 ENCOUNTER — Encounter (HOSPITAL_COMMUNITY)

## 2025-01-02 ENCOUNTER — Encounter (HOSPITAL_COMMUNITY): Admission: RE | Admit: 2025-01-02 | Discharge: 2025-01-02 | Attending: Cardiology

## 2025-01-02 DIAGNOSIS — I2089 Other forms of angina pectoris: Secondary | ICD-10-CM

## 2025-01-03 NOTE — Progress Notes (Signed)
 Vincent Stevens                                          MRN: [id number]   01/03/2025   The [facility]I Quality Team Specialist reviewed this patient medical record for the purposes of chart review for care gap closure. The following were reviewed: chart review for care gap closure-kidney health evaluation for diabetes:eGFR  and uACR.    VBCI Quality Team

## 2025-01-04 ENCOUNTER — Encounter (HOSPITAL_COMMUNITY): Admission: RE | Admit: 2025-01-04

## 2025-01-04 DIAGNOSIS — I2089 Other forms of angina pectoris: Secondary | ICD-10-CM

## 2025-01-07 ENCOUNTER — Encounter (HOSPITAL_COMMUNITY)

## 2025-01-09 ENCOUNTER — Encounter (HOSPITAL_COMMUNITY)

## 2025-03-15 ENCOUNTER — Ambulatory Visit: Admitting: Family Medicine

## 2025-04-15 ENCOUNTER — Ambulatory Visit
